# Patient Record
Sex: Female | Born: 1955 | Race: White | Hispanic: No | Smoking: Never smoker
Health system: Southern US, Community
[De-identification: ages and names within clinical notes are randomized; demographics above are authoritative.]

## PROBLEM LIST (undated history)

## (undated) DIAGNOSIS — E669 Obesity, unspecified: Secondary | ICD-10-CM

## (undated) DIAGNOSIS — K219 Gastro-esophageal reflux disease without esophagitis: Secondary | ICD-10-CM

## (undated) DIAGNOSIS — K589 Irritable bowel syndrome without diarrhea: Secondary | ICD-10-CM

## (undated) DIAGNOSIS — I429 Cardiomyopathy, unspecified: Secondary | ICD-10-CM

## (undated) DIAGNOSIS — F419 Anxiety disorder, unspecified: Secondary | ICD-10-CM

## (undated) DIAGNOSIS — R21 Rash and other nonspecific skin eruption: Secondary | ICD-10-CM

## (undated) DIAGNOSIS — R51 Headache: Secondary | ICD-10-CM

## (undated) DIAGNOSIS — N289 Disorder of kidney and ureter, unspecified: Secondary | ICD-10-CM

## (undated) DIAGNOSIS — I10 Essential (primary) hypertension: Secondary | ICD-10-CM

## (undated) DIAGNOSIS — A159 Respiratory tuberculosis unspecified: Secondary | ICD-10-CM

## (undated) DIAGNOSIS — M1990 Unspecified osteoarthritis, unspecified site: Secondary | ICD-10-CM

## (undated) DIAGNOSIS — N393 Stress incontinence (female) (male): Secondary | ICD-10-CM

## (undated) DIAGNOSIS — I509 Heart failure, unspecified: Secondary | ICD-10-CM

## (undated) DIAGNOSIS — D649 Anemia, unspecified: Secondary | ICD-10-CM

## (undated) DIAGNOSIS — B1910 Unspecified viral hepatitis B without hepatic coma: Secondary | ICD-10-CM

## (undated) DIAGNOSIS — N390 Urinary tract infection, site not specified: Secondary | ICD-10-CM

## (undated) DIAGNOSIS — T8859XA Other complications of anesthesia, initial encounter: Secondary | ICD-10-CM

## (undated) DIAGNOSIS — R519 Headache, unspecified: Secondary | ICD-10-CM

## (undated) DIAGNOSIS — Z9889 Other specified postprocedural states: Secondary | ICD-10-CM

## (undated) DIAGNOSIS — R112 Nausea with vomiting, unspecified: Secondary | ICD-10-CM

## (undated) DIAGNOSIS — N809 Endometriosis, unspecified: Secondary | ICD-10-CM

## (undated) DIAGNOSIS — T4145XA Adverse effect of unspecified anesthetic, initial encounter: Secondary | ICD-10-CM

## (undated) DIAGNOSIS — J189 Pneumonia, unspecified organism: Secondary | ICD-10-CM

## (undated) DIAGNOSIS — E785 Hyperlipidemia, unspecified: Secondary | ICD-10-CM

## (undated) HISTORY — DX: Disorder of kidney and ureter, unspecified: N28.9

## (undated) HISTORY — DX: Urinary tract infection, site not specified: N39.0

## (undated) HISTORY — DX: Unspecified viral hepatitis B without hepatic coma: B19.10

## (undated) HISTORY — DX: Cardiomyopathy, unspecified: I42.9

## (undated) HISTORY — DX: Anemia, unspecified: D64.9

## (undated) HISTORY — DX: Heart failure, unspecified: I50.9

## (undated) HISTORY — DX: Irritable bowel syndrome, unspecified: K58.9

## (undated) HISTORY — PX: TONSILLECTOMY: SUR1361

---

## 1983-07-14 DIAGNOSIS — A159 Respiratory tuberculosis unspecified: Secondary | ICD-10-CM

## 1983-07-14 HISTORY — DX: Respiratory tuberculosis unspecified: A15.9

## 1985-07-13 HISTORY — PX: APPENDECTOMY: SHX54

## 1987-07-14 HISTORY — PX: ABDOMINAL ADHESION SURGERY: SHX90

## 1991-07-14 HISTORY — PX: LAPAROSCOPIC LYSIS OF ADHESIONS: SHX5905

## 1992-12-11 HISTORY — PX: OOPHORECTOMY: SHX86

## 1995-04-13 HISTORY — PX: OOPHORECTOMY: SHX86

## 1996-01-11 HISTORY — PX: ABDOMINAL HYSTERECTOMY: SHX81

## 1998-04-08 ENCOUNTER — Ambulatory Visit (HOSPITAL_COMMUNITY): Admission: RE | Admit: 1998-04-08 | Discharge: 1998-04-08 | Payer: Self-pay | Admitting: Obstetrics and Gynecology

## 1999-02-05 ENCOUNTER — Encounter (INDEPENDENT_AMBULATORY_CARE_PROVIDER_SITE_OTHER): Payer: Self-pay

## 1999-02-05 ENCOUNTER — Other Ambulatory Visit: Admission: RE | Admit: 1999-02-05 | Discharge: 1999-02-05 | Payer: Self-pay | Admitting: Obstetrics and Gynecology

## 2000-02-06 ENCOUNTER — Other Ambulatory Visit: Admission: RE | Admit: 2000-02-06 | Discharge: 2000-02-06 | Payer: Self-pay | Admitting: Obstetrics and Gynecology

## 2000-03-22 ENCOUNTER — Encounter: Payer: Self-pay | Admitting: Gastroenterology

## 2000-03-22 ENCOUNTER — Encounter: Admission: RE | Admit: 2000-03-22 | Discharge: 2000-03-22 | Payer: Self-pay | Admitting: Gastroenterology

## 2000-03-24 ENCOUNTER — Encounter: Admission: RE | Admit: 2000-03-24 | Discharge: 2000-03-24 | Payer: Self-pay | Admitting: Gastroenterology

## 2000-03-24 ENCOUNTER — Encounter: Payer: Self-pay | Admitting: Gastroenterology

## 2000-11-08 ENCOUNTER — Encounter: Admission: RE | Admit: 2000-11-08 | Discharge: 2000-11-08 | Payer: Self-pay | Admitting: Family Medicine

## 2000-11-08 ENCOUNTER — Encounter: Payer: Self-pay | Admitting: Family Medicine

## 2000-11-09 ENCOUNTER — Encounter: Admission: RE | Admit: 2000-11-09 | Discharge: 2000-11-09 | Payer: Self-pay | Admitting: Family Medicine

## 2000-11-09 ENCOUNTER — Encounter: Payer: Self-pay | Admitting: Family Medicine

## 2000-11-16 ENCOUNTER — Encounter: Payer: Self-pay | Admitting: Surgery

## 2000-11-16 ENCOUNTER — Encounter: Admission: RE | Admit: 2000-11-16 | Discharge: 2000-11-16 | Payer: Self-pay | Admitting: Surgery

## 2000-11-19 ENCOUNTER — Encounter: Payer: Self-pay | Admitting: Surgery

## 2000-11-19 ENCOUNTER — Encounter: Admission: RE | Admit: 2000-11-19 | Discharge: 2000-11-19 | Payer: Self-pay | Admitting: Surgery

## 2000-11-24 HISTORY — PX: CHOLECYSTECTOMY: SHX55

## 2000-11-29 ENCOUNTER — Encounter: Payer: Self-pay | Admitting: Surgery

## 2000-12-01 ENCOUNTER — Encounter (INDEPENDENT_AMBULATORY_CARE_PROVIDER_SITE_OTHER): Payer: Self-pay | Admitting: Specialist

## 2000-12-01 ENCOUNTER — Encounter: Payer: Self-pay | Admitting: Surgery

## 2000-12-01 ENCOUNTER — Observation Stay (HOSPITAL_COMMUNITY): Admission: RE | Admit: 2000-12-01 | Discharge: 2000-12-02 | Payer: Self-pay | Admitting: Surgery

## 2001-03-08 ENCOUNTER — Other Ambulatory Visit: Admission: RE | Admit: 2001-03-08 | Discharge: 2001-03-08 | Payer: Self-pay | Admitting: Obstetrics and Gynecology

## 2001-12-06 ENCOUNTER — Encounter: Payer: Self-pay | Admitting: Obstetrics and Gynecology

## 2001-12-06 ENCOUNTER — Encounter: Admission: RE | Admit: 2001-12-06 | Discharge: 2001-12-06 | Payer: Self-pay | Admitting: Obstetrics and Gynecology

## 2002-05-26 ENCOUNTER — Other Ambulatory Visit: Admission: RE | Admit: 2002-05-26 | Discharge: 2002-05-26 | Payer: Self-pay | Admitting: Obstetrics and Gynecology

## 2002-10-24 ENCOUNTER — Emergency Department (HOSPITAL_COMMUNITY): Admission: EM | Admit: 2002-10-24 | Discharge: 2002-10-24 | Payer: Self-pay | Admitting: Emergency Medicine

## 2002-10-24 ENCOUNTER — Encounter: Payer: Self-pay | Admitting: Emergency Medicine

## 2002-12-21 ENCOUNTER — Encounter: Admission: RE | Admit: 2002-12-21 | Discharge: 2002-12-21 | Payer: Self-pay | Admitting: Family Medicine

## 2002-12-21 ENCOUNTER — Encounter: Payer: Self-pay | Admitting: Family Medicine

## 2006-12-08 ENCOUNTER — Ambulatory Visit (HOSPITAL_COMMUNITY): Admission: RE | Admit: 2006-12-08 | Discharge: 2006-12-08 | Payer: Self-pay | Admitting: Urology

## 2006-12-20 ENCOUNTER — Ambulatory Visit (HOSPITAL_COMMUNITY): Admission: RE | Admit: 2006-12-20 | Discharge: 2006-12-20 | Payer: Self-pay | Admitting: Surgery

## 2006-12-29 ENCOUNTER — Encounter (HOSPITAL_BASED_OUTPATIENT_CLINIC_OR_DEPARTMENT_OTHER): Payer: Self-pay | Admitting: General Surgery

## 2006-12-29 ENCOUNTER — Inpatient Hospital Stay (HOSPITAL_COMMUNITY): Admission: RE | Admit: 2006-12-29 | Discharge: 2007-01-04 | Payer: Self-pay | Admitting: General Surgery

## 2007-01-11 ENCOUNTER — Inpatient Hospital Stay (HOSPITAL_COMMUNITY): Admission: EM | Admit: 2007-01-11 | Discharge: 2007-01-13 | Payer: Self-pay | Admitting: Emergency Medicine

## 2007-01-18 ENCOUNTER — Ambulatory Visit (HOSPITAL_COMMUNITY): Admission: RE | Admit: 2007-01-18 | Discharge: 2007-01-18 | Payer: Self-pay | Admitting: General Surgery

## 2007-05-23 ENCOUNTER — Ambulatory Visit (HOSPITAL_COMMUNITY): Admission: RE | Admit: 2007-05-23 | Discharge: 2007-05-23 | Payer: Self-pay | Admitting: General Surgery

## 2007-06-24 ENCOUNTER — Emergency Department (HOSPITAL_COMMUNITY): Admission: EM | Admit: 2007-06-24 | Discharge: 2007-06-24 | Payer: Self-pay | Admitting: Emergency Medicine

## 2007-08-10 ENCOUNTER — Ambulatory Visit (HOSPITAL_COMMUNITY): Admission: RE | Admit: 2007-08-10 | Discharge: 2007-08-10 | Payer: Self-pay | Admitting: Family Medicine

## 2008-07-13 HISTORY — PX: COLON SURGERY: SHX602

## 2010-08-03 ENCOUNTER — Encounter: Payer: Self-pay | Admitting: Surgery

## 2010-11-25 NOTE — Op Note (Signed)
NAMEOTIS, BURRESS               ACCOUNT NO.:  [id number]   MEDICAL RECORD NO.:  [id number]          PATIENT TYPE:  INP   LOCATION:  3310                         FACILITY:  [facility]   PHYSICIAN:  Leonie Man, M.D.   DATE OF BIRTH:  1956/03/10   DATE OF PROCEDURE:  12/29/2006  DATE OF DISCHARGE:                               OPERATIVE REPORT   PREOPERATIVE DIAGNOSIS:  Rectovaginal fistula with diverticulitis.   POSTOPERATIVE DIAGNOSIS:  Rectovaginal fistula with diverticulitis.   PROCEDURE:  Takedown of rectovaginal fistula with rectosigmoid colectomy  and low colorectal anastomosis.   SURGEON:  Leonie Man, M.D.   ASSISTANT:  Rose Phi. Maple Hudson, M.D.   ANESTHESIA:  General.   INDICATIONS:  The patient is a 55 year old female who presents with a  history of multiple abdominal operations having previously undergone a  hysterectomy.  She has been having recurrent episodes of diverticulitis  over the past several years. Several weeks ago, she began passing stool  through the vagina.  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 CT scan of the  abdomen and pelvis also confirmed the presence of a fistula between the  vaginal stump and the rectum.  The patient comes to the operating room  now after the risks and potential benefits of surgery have been fully  discussed, all questions answered, and consent obtained for same.   DESCRIPTION OF PROCEDURE:  Following the induction of satisfactory  general anesthesia, with the patient positioned in the lithotomy  position, positive identification of the patient was made.  The abdomen  and perineum were prepped and draped to be included in a sterile  operative field. Exploratory laparotomy is carried out through a midline  incision extending from just above the pubic tubercle up to the  umbilicus, deepened through skin and subcutaneous tissues and through  the linea alba.   The midline was opened through the old scar cicatrix.  Dissection was carried down into the pelvis where multiple loops of  small intestine were attached within the pelvis.  These were lysed. The  patient was placed in Trendelenburg position and the small intestine  packed into the upper abdomen.   Dissection was carried down along the retroperitoneal reflection of the  sigmoid colon, carrying this down to the area of attachment to the  vaginal stump. This was dissected free and brought up into the wound,  dissection along the lateral walls of the rectum were also carried out  so as to mobilize the rectum up further into the abdomen.  I then  dissected and transected approximately the mid to lower third of the  rectum leaving adequate margins around the area of inflammation.  This  was done with a contour stapler.  The proximal colon was also divided  with a GIA-75 stapler and the intervening mesentery was taken with a  LigaSure, thereby removing the area of inflammation in the rectosigmoid.  Hemostasis was ensured within the pelvis and an EEA stapled anastomosis  was carried out between the mid sigmoid and the distal rectum.  The  resulting anastomosis was noted to be widely patent.  Proctosigmoidoscopy was carried out and air pumped into the rectum with  fluid in the pelvis and there was no evidence of leak.   All areas of dissection were then checked for hemostasis and noted to be  dry. Sponge, instrument and sharp counts were doubly verified.  The  peritoneal cavity was irrigated with multiple aliquots of normal saline  and aspirated out.  The midline incision was then closed with a running  #1 Novofil suture.  The subcutaneous tissues were thoroughly irrigated.  The skin was closed with staples.  A sterile dressing was applied to the  wound, the anesthetic reversed, and the patient removed from the  operating room to the recovery room in stable condition.  She tolerated  the  procedure well.      Leonie Man, M.D.  Electronically Signed     PB/MEDQ  D:  12/29/2006  T:  12/29/2006  Job:  [id number]   cc:   Currie Paris, M.D.  Donia Guiles, M.D.

## 2010-11-28 NOTE — Discharge Summary (Signed)
Shelly [name], Shelly [name]               ACCOUNT NO.:  [id number]   MEDICAL RECORD NO.:  [id number]          PATIENT TYPE:  OUT   LOCATION:  XRAY                         FACILITY:  [facility]   PHYSICIAN:  Leonie Man, M.D.   DATE OF BIRTH:  1956/02/15   DATE OF ADMISSION:  12/29/2006  DATE OF DISCHARGE:  01/04/2007                               DISCHARGE SUMMARY   ADMISSION DIAGNOSIS:  Diverticulitis with rectovaginal fistula.   DISCHARGE DIAGNOSIS:  Same.   PROCEDURE:  Rectosigmoid colectomy.   COMPLICATIONS:  None.   CONDITION ON DISCHARGE:  Improved.   SECONDARY DIAGNOSES:  A newly discovered diabetes mellitus.   DISCHARGE MEDICATIONS:  Mepergan 1 to 2 every 24 hours p.r.n. pain.   FOLLOW UP:  Follow up will be with diabetic teaching service and with  Dr. Lurene Shadow in 1 week.   HOSPITAL COURSE:  The patient is a 55 year old female with a feculent  discharge from the vagina during which the course of workup showed a  fistula between the sigmoid colon and the vagina.  The patient was  admitted to the hospital on the day of surgery.  Following routine bowel  prep, she was taken to the operating room on 12/29/2006 where she  underwent a rectosigmoid resection and low colorectal anastomosis.  Her  postoperative course was benign with normal resumption of diet and  activity.  During the course of her of her hospitalization, a hemoglobin  A1c was checked and noted to be elevated.  Thereby, including a  diagnosis of type 2 diabetes mellitus.  The patient was seen and  counseled by the diabetes teaching service and was scheduled for follow  up with them on discharge.  At the time of discharge, the incision was  healing well and the patient was tolerating a regular diet without any  difficulty.  She is being discharged to be followed up in the office in  2 weeks.      Leonie Man, M.D.  Electronically Signed     PB/MEDQ  D:  02/10/2007  T:  02/11/2007  Job:  [id number]

## 2010-11-28 NOTE — Op Note (Signed)
[facility]  Patient:    Shelly [name], Shelly [name]                      MRN: [id number] Proc. Date: 12/01/00 Adm. Date:  [id number] Attending:  Charlton Haws CC:         Desma Maxim, M.D.  Florencia Reasons, M.D.  Guy Sandifer Ar[place], M.D.   Operative Report  CCS#:  [id number]  PREOPERATIVE DIAGNOSIS:  Biliary dyskinesia.  POSTOPERATIVE DIAGNOSIS:  Biliary dyskinesia.  OPERATION PERFORMED:  Laparoscopic cholecystectomy with operative cholangiogram.  SURGEON:  Dr. Jamey Ripa.  ASSISTANT:  Dr. Earlene Plater.  ANESTHESIA:  General endotracheal.  CLINICAL HISTORY:  This patient is a 55 year old lady with various abdominal complaints but particularly has been having some upper abdominal, right midline, and right upper quadrant pain associated with some nausea and change in her stools. GI workup was negative except for the fact that she had had abnormal biliary emptying scan suggesting biliary dyskinesia. She has had multiple prior operations.  DESCRIPTION OF PROCEDURE:  The patient was brought to the operating room and after satisfactory general anesthesia had been obtained, the abdomen was prepped and draped. Because of prior umbilical and lower midline incisions, I made an incision just above the umbilicus and separated the subcutaneous and fatty tissues down to the fascia which I was able to identify and grasp with a Kocher and opened with a knife. She had approximately 2 inches of adipose tissue to go through. Once I was able to enter the peritoneal cavity under direct vision, I put my finger in and could feel what felt like some omental adhesions but was able to find the preperitoneal cavity and put a Hasson cannula in and hold it in place with some holding sutures of Vicryl.  The abdomen was insufflated and the 10 mm trocar placed in the epigastrium. Using the camera there, I looked back down in the rest of the abdomen and the only specific  abnormalities were some omental adhesions to the lower midline but there was no bowel involved in any of these adhesions and no evidence of hernias or other problems. I couldnt really visualize the pelvic organs well.  The patient was placed in reverse Trendelenburg and two 5 mm trocars placed in the usual positions. The gallbladder was retracted over the liver and tractioned outward by the [facility] and the peritoneum opened. I could identify the cystic duct at its junction with the gallbladder and see as it went down towards the common duct but didnt dissect that out completely. The cystic artery was identified as it was coming up on the gallbladder and could be seen coming off of what looked like the right hepatic artery. I clipped the cystic duct once at its junction with the gallbladder and put one clip on th cystic artery initially. The cystic duct was opened and some bile milked out.  An angiocath was used to thread a Reddick catheter into the abdominal cavity and this was threaded into the cystic duct and held with a clip. Operative cholangiogram showed good filling of the hepatic radicles and common duct with good emptying into the duodenum and had a little bit of filling of the pancreatic duct. This looking normal, the cystic duct catheter was then removed and three clips placed on the stay side of the cystic duct and it was divided.  The cystic artery had two more duct clips put on it and it was divided leaving  two clips on the stay side. The gallbladder was removed from below to above with coagulation current of the cautery. Once it was disconnected, we looked to make sure everything appeared to be dry and there was no blood or bile leaks. The gallbladder was removed out the umbilical port and that site was fitted with my finger while reinsufflated and made a final check for hemostasis. Everything appeared to be okay and the lateral ports were removed. Those sites did  not bleed.  The supraumbilical incision was closed with two sutures of #0 Prolene. A final check was made with the camera in the epigastric port and the abdomen deflated. The skin was closed with 4-0 monocryl subcuticular plus Steri-Strips. The patient tolerated the procedure well. There were no operative complications. All counts were correct. DD:  12/01/00 TD:  12/01/00 Job: [id number]

## 2011-04-28 LAB — CULTURE, ROUTINE-ABSCESS: Culture: NO GROWTH

## 2011-04-28 LAB — BASIC METABOLIC PANEL
CO2: 22
Calcium: 9
Creatinine, Ser: 0.92
Glucose, Bld: 194 — ABNORMAL HIGH
Sodium: 133 — ABNORMAL LOW

## 2011-04-28 LAB — URINALYSIS, ROUTINE W REFLEX MICROSCOPIC
Bilirubin Urine: NEGATIVE
Ketones, ur: NEGATIVE
Nitrite: NEGATIVE
Protein, ur: NEGATIVE
Specific Gravity, Urine: 1.015
Urobilinogen, UA: 0.2

## 2011-04-28 LAB — DIFFERENTIAL
Basophils Absolute: 0.3 — ABNORMAL HIGH
Basophils Relative: 1
Monocytes Absolute: 1.8 — ABNORMAL HIGH
Neutro Abs: 16.2 — ABNORMAL HIGH

## 2011-04-28 LAB — CBC
Hemoglobin: 10.7 — ABNORMAL LOW
MCHC: 35.2
RDW: 12.8

## 2011-04-28 LAB — ANAEROBIC CULTURE

## 2011-04-28 LAB — URINE MICROSCOPIC-ADD ON

## 2011-04-29 LAB — CBC
HCT: 26.1 — ABNORMAL LOW
HCT: 37.4
Hemoglobin: 10.7 — ABNORMAL LOW
Hemoglobin: 9 — ABNORMAL LOW
MCHC: 34.5
MCV: 88.1
MCV: 88.7
MCV: 89.2
Platelets: 231
Platelets: 271
Platelets: 338
RBC: 3.5 — ABNORMAL LOW
RDW: 13
RDW: 13
WBC: 12 — ABNORMAL HIGH
WBC: 23 — ABNORMAL HIGH

## 2011-04-29 LAB — DIFFERENTIAL
Basophils Absolute: 0.1
Lymphocytes Relative: 33
Monocytes Absolute: 0.6
Monocytes Relative: 6
Neutro Abs: 5.8
Neutrophils Relative %: 55

## 2011-04-29 LAB — URINALYSIS, ROUTINE W REFLEX MICROSCOPIC
Bilirubin Urine: NEGATIVE
Ketones, ur: NEGATIVE
Nitrite: NEGATIVE
Urobilinogen, UA: 0.2
pH: 5.5

## 2011-04-29 LAB — TYPE AND SCREEN: ABO/RH(D): O NEG

## 2011-04-29 LAB — COMPREHENSIVE METABOLIC PANEL
ALT: 84 — ABNORMAL HIGH
AST: 46 — ABNORMAL HIGH
Albumin: 2.4 — ABNORMAL LOW
Albumin: 3.4 — ABNORMAL LOW
Alkaline Phosphatase: 67
BUN: 11
CO2: 24
Chloride: 103
Chloride: 106
Creatinine, Ser: 0.92
Creatinine, Ser: 1.01
GFR calc Af Amer: >60
GFR calc non Af Amer: 56 — ABNORMAL LOW
Glucose, Bld: 193 — ABNORMAL HIGH
Glucose, Bld: 210 — ABNORMAL HIGH
Potassium: 4.8
Sodium: 135
Total Bilirubin: 0.8
Total Bilirubin: 0.9
Total Protein: 7.6

## 2011-04-29 LAB — BASIC METABOLIC PANEL
BUN: 10
Creatinine, Ser: 0.84
GFR calc non Af Amer: >60
Potassium: 4.1

## 2011-04-29 LAB — PROTIME-INR: INR: 1.1

## 2011-04-29 LAB — APTT: aPTT: 37

## 2011-04-29 LAB — HEMOGLOBIN A1C: Hgb A1c MFr Bld: 8.6 — ABNORMAL HIGH

## 2011-04-29 LAB — URINE MICROSCOPIC-ADD ON: Bacteria, UA: NONE SEEN

## 2011-04-29 LAB — ABO/RH: ABO/RH(D): O NEG

## 2011-09-09 ENCOUNTER — Emergency Department (HOSPITAL_COMMUNITY): Payer: BC Managed Care – PPO

## 2011-09-09 ENCOUNTER — Observation Stay (HOSPITAL_COMMUNITY)
Admission: EM | Admit: 2011-09-09 | Discharge: 2011-09-09 | Disposition: A | Discharge status: Home / Self Care | Payer: BC Managed Care – PPO | Attending: Emergency Medicine | Admitting: Emergency Medicine

## 2011-09-09 ENCOUNTER — Encounter (HOSPITAL_COMMUNITY): Payer: Self-pay | Admitting: *Deleted

## 2011-09-09 DIAGNOSIS — E119 Type 2 diabetes mellitus without complications: Secondary | ICD-10-CM | POA: Insufficient documentation

## 2011-09-09 DIAGNOSIS — R509 Fever, unspecified: Secondary | ICD-10-CM | POA: Insufficient documentation

## 2011-09-09 DIAGNOSIS — I10 Essential (primary) hypertension: Secondary | ICD-10-CM | POA: Insufficient documentation

## 2011-09-09 DIAGNOSIS — F411 Generalized anxiety disorder: Secondary | ICD-10-CM | POA: Insufficient documentation

## 2011-09-09 DIAGNOSIS — R42 Dizziness and giddiness: Secondary | ICD-10-CM | POA: Insufficient documentation

## 2011-09-09 DIAGNOSIS — B349 Viral infection, unspecified: Secondary | ICD-10-CM

## 2011-09-09 DIAGNOSIS — R51 Headache: Principal | ICD-10-CM | POA: Insufficient documentation

## 2011-09-09 DIAGNOSIS — R000 Tachycardia, unspecified: Secondary | ICD-10-CM | POA: Insufficient documentation

## 2011-09-09 DIAGNOSIS — R1110 Vomiting, unspecified: Secondary | ICD-10-CM | POA: Insufficient documentation

## 2011-09-09 DIAGNOSIS — E860 Dehydration: Secondary | ICD-10-CM | POA: Insufficient documentation

## 2011-09-09 HISTORY — DX: Hyperlipidemia, unspecified: E78.5

## 2011-09-09 HISTORY — DX: Anxiety disorder, unspecified: F41.9

## 2011-09-09 HISTORY — DX: Obesity, unspecified: E66.9

## 2011-09-09 HISTORY — DX: Essential (primary) hypertension: I10

## 2011-09-09 HISTORY — DX: Gastro-esophageal reflux disease without esophagitis: K21.9

## 2011-09-09 LAB — CSF CELL COUNT WITH DIFFERENTIAL
Eosinophils, CSF: NONE SEEN % (ref 0–1)
RBC Count, CSF: 1 /mm3 — ABNORMAL HIGH
Tube #: 1
Tube #: 4

## 2011-09-09 LAB — CBC
MCH: 29.5 pg (ref 26.0–34.0)
MCHC: 34.7 g/dL (ref 30.0–36.0)
Platelets: 383 10*3/uL (ref 150–400)
RBC: 3.9 MIL/uL (ref 3.87–5.11)
RDW: 13.3 % (ref 11.5–15.5)

## 2011-09-09 LAB — GRAM STAIN

## 2011-09-09 LAB — BASIC METABOLIC PANEL
Calcium: 9.9 mg/dL (ref 8.4–10.5)
GFR calc Af Amer: 38 mL/min — ABNORMAL LOW (ref >90)
GFR calc non Af Amer: 33 mL/min — ABNORMAL LOW (ref >90)
Sodium: 134 mEq/L — ABNORMAL LOW (ref 135–145)

## 2011-09-09 MED ORDER — SODIUM CHLORIDE 0.9 % IV BOLUS (SEPSIS): 1000.0000 mL | Freq: Once | INTRAVENOUS | Status: DC

## 2011-09-09 MED ORDER — METOCLOPRAMIDE HCL 5 MG/ML IJ SOLN: 10.0000 mg | Freq: Four times a day (QID) | INTRAMUSCULAR | Status: DC | PRN

## 2011-09-09 MED ORDER — PROMETHAZINE HCL 25 MG PO TABS: 25.0000 mg | ORAL_TABLET | Freq: Three times a day (TID) | ORAL | Status: DC | PRN

## 2011-09-09 MED ORDER — METOCLOPRAMIDE HCL 5 MG/ML IJ SOLN
10.0000 mg | Freq: Once | INTRAMUSCULAR | Status: AC
  Administered 2011-09-09: 10 mg via INTRAVENOUS
  Filled 2011-09-09: qty 2

## 2011-09-09 MED ORDER — SODIUM CHLORIDE 0.9 % IV BOLUS (SEPSIS)
1000.0000 mL | Freq: Once | INTRAVENOUS | Status: AC
  Administered 2011-09-09: 1000 mL via INTRAVENOUS

## 2011-09-09 MED ORDER — ACETAMINOPHEN 325 MG PO TABS: 650.0000 mg | ORAL_TABLET | ORAL | Status: DC | PRN

## 2011-09-09 NOTE — ED Notes (Signed)
Patient stated wants to go home and would like a prescription for phenergan.  PA notified.

## 2011-09-09 NOTE — ED Notes (Signed)
Pt back from IR 

## 2011-09-09 NOTE — Discharge Instructions (Signed)
Please follow up with Dr Clelia Croft tomorrow morning to schedule a close follow up appointment for tomorrow or the following day for a recheck of your kidney function.  Please drink plenty of fluids and rest over the next few days.  You may return to the ER at any time for worsening condition or any new symptoms that concern you.    Dehydration Dehydration is the reduction of water and fluid from the body to a level below that required for proper functioning. CAUSES  Dehydration occurs when there is excessive fluid loss from the body or when loss of normal fluids is not adequately replaced.  Loss of fluids occurs in vomiting, diarrhea, excessive sweating, excessive urine output, or excessive loss of fluid from the lungs (as occurs in fever or in patients on a ventilator).   Inadequate fluid replacement occurs with nausea or decreased appetite due to illness, sore throat, or mouth pain.  SYMPTOMS  Mild dehydration  Thirst (infants and young children may not be able to tell you they are thirsty).   Dry lips.   Slightly dry mouth membranes.  Moderate dehydration  Very dry mouth membranes.   Sunken eyes.   Sunken soft spot (fontanelle) on infant's head.   Skin does not bounce back quickly when lightly pinched and released.   Decreased urine production.   Decreased tear production.  Severe dehydration  Rapid, weak pulse (more than 100 beats per minute at rest).   Cold hands and feet.   Loss of ability to sweat in spite of heat and temperature.   Rapid breathing.   Blue lips.   Confusion, lethargy, difficult to arouse.   Minimal urine production.   No tears.  DIAGNOSIS  Your caregiver will diagnose dehydration based on your symptoms and your exam. Blood and urine tests will help confirm the diagnosis. The diagnostic evaluation should also identify the cause of dehydration. PREVENTION  The body depends on a proper balance of fluid and salts (electrolytes) for normal function.  Adequate fluid intake in the presence of illness or other stresses (such as extreme exercise) is important.  TREATMENT   Mild dehydration is safe to self-treat for most ages as long as it does not worsen. Contact your caregiver for even mild dehydration in infants and the elderly.   In teenagers and adults with moderate dehydration, careful home treatment (as outlined below) can be safe. Phone contact with a caregiver is advised. Children under 3 years of age with moderate dehydration should see a caregiver.   If you or your child is severely dehydrated, go to a hospital for treatment. Intravenous (IV) fluids will quickly reverse dehydration and are often lifesaving in young children, infants, and elderly persons.  HOME CARE INSTRUCTIONS  Small amounts of fluids should be taken frequently. Large amounts at one time may not be tolerated. Plain water may be harmful in infants and the elderly. Oral rehydration solutions (ORS) are available at pharmacies and grocery stores. ORS replaces water and important electrolytes in proper proportions. Sports drinks are not as effective as ORS and may be harmful because the sugar can make diarrhea worse.  As a general guideline for children, replace any new fluid losses from diarrhea and/or vomiting with ORS as follows:   If your child weighs 22 pounds or under (10 kg or less), give 60-120 mL (1/4-1/2 cup or 2-4 ounces) of ORS for each diarrheal stool or vomiting episode.   If your child weighs more than 22 pounds (more than 10 kg),  give 120-240 mL (1/2-1 cup or 4-8 ounces) of ORS for each diarrheal stool or vomiting episode.   If your child is vomiting, it may be helpful to give the above ORS replacement in 5 mL (1 teaspoon) amounts every 5 minutes and increase as tolerated.   While correcting for dehydration, children should eat normally. However, foods high in sugar should be avoided because they may worsen diarrhea. Large amounts of carbonated soft drinks,  juice, gelatin desserts, and other highly sugared drinks should be avoided.   After correction of dehydration, other liquids that are appealing to the child may be added. Children should drink small amounts of fluids frequently and fluids should be increased as tolerated. Children should drink enough fluids to keep urine clear or pale yellow.   Adults should eat normally while drinking more fluids than usual. Drink small amounts of fluids frequently and increase the amount as tolerated. Drink enough fluids to keep urine clear or pale yellow. Broths, weak decaffeinated tea, lemon-lime soft drinks (allowed to go flat), and ORS replace fluids and electrolytes.   Avoid:   Carbonated drinks.   Juice.   Extremely hot or cold fluids.   Caffeine drinks.   Fatty, greasy foods.   Alcohol.   Tobacco.   Too much intake of anything at one time.   Gelatin desserts.   Probiotics are active cultures of beneficial bacteria. They may lessen the amount and number of diarrheal stools in adults. Probiotics can be found in yogurt with active cultures and in supplements.   Wash your hands well to avoid spreading germs (bacteria) and viruses.   Antidiarrheal medicines are not recommended for infants and children.   Only take over-the-counter or prescription medicines for pain, discomfort, or fever as directed by your caregiver. Do not give aspirin to children.   For adults with dehydration, ask your caregiver if you should continue all prescribed and over-the-counter medicines.   If your caregiver has given you a follow-up appointment, it is very important to keep that appointment. Not keeping the appointment could result in a lasting (chronic) or permanent injury and disability. If there is any problem keeping the appointment, you must call to reschedule.  SEEK IMMEDIATE MEDICAL CARE IF:   You are unable to keep fluids down or other symptoms become worse despite treatment.   Vomiting or diarrhea  develops and becomes persistent.   There is vomiting of blood or green matter (bile).   There is blood in the stool or the stools are black and tarry.   There is no urine output in 6 to 8 hours or there is only a small amount of very dark urine.   Abdominal pain develops, increases, or localizes.   You or your child has an oral temperature above 102 F (38.9 C), not controlled by medicine.   Your baby is older than 3 months with a rectal temperature of 102.25F (38.9 C) or higher.   Your baby is 73 months old or younger with a rectal temperature of 100.4 F (38 C) or higher.   You develop excessive weakness, dizziness, fainting, or extreme thirst.   You develop a rash, stiff neck, severe headache, or you become irritable, sleepy, or difficult to awaken.  MAKE SURE YOU:  Understand these instructions. Headache Headaches are caused by many different problems. Most commonly, headache is caused by muscle tension from an injury, fatigue, or emotional upset. Excessive muscle contractions in the scalp and neck result in a headache that often feels like  a tight band around the head. Tension headaches often have areas of tenderness over the scalp and the back of the neck. These headaches may last for hours, days, or longer, and some may contribute to migraines in those who have migraine problems. Migraines usually cause a throbbing headache, which is made worse by activity. Sometimes only one side of the head hurts. Nausea, vomiting, eye pain, and avoidance of food are common with migraines. Visual symptoms such as light sensitivity, blind spots, or flashing lights may also occur. Loud noises may worsen migraine headaches. Many factors may cause migraine headaches:  Emotional stress, lack of sleep, and menstrual periods.   Alcohol and some drugs (such as birth control pills).   Diet factors (fasting, caffeine, food preservatives, chocolate).   Environmental factors (weather changes, bright  lights, odors, smoke).  Other causes of headaches include minor injuries to the head. Arthritis in the neck; problems with the jaw, eyes, ears, or nose are also causes of headaches. Allergies, drugs, alcohol, and exposure to smoke can also cause moderate headaches. Rebound headaches can occur if someone uses pain medications for a long period of time and then stops. Less commonly, blood vessel problems in the neck and brain (including stroke) can cause various types of headache. Treatment of headaches includes medicines for pain and relaxation. Ice packs or heat applied to the back of the head and neck help some people. Massaging the shoulders, neck and scalp are often very useful. Relaxation techniques and stretching can help prevent these headaches. Avoid alcohol and cigarette smoking as these tend to make headaches worse. Please see your caregiver if your headache is not better in 2 days.  SEEK IMMEDIATE MEDICAL CARE IF:   You develop a high fever, chills, or repeated vomiting.   You faint or have difficulty with vision.   You develop unusual numbness or weakness of your arms or legs.   Relief of pain is inadequate with medication, or you develop severe pain.   You develop confusion, or neck stiffness.   You have a worsening of a headache or do not obtain relief.  Document Released: 06/29/2005 Document Revised: 03/11/2011 Document Reviewed: 12/23/2006 [facility] Patient Information 2012 [place], [place].Viral Infections A viral infection can be caused by different types of viruses.Most viral infections are not serious and resolve on their own. However, some infections may cause severe symptoms and may lead to further complications. SYMPTOMS Viruses can frequently cause: Minor sore throat.  Aches and pains.  Headaches.  Runny nose.  Different types of rashes.  Watery eyes.  Tiredness.  Cough.  Loss of appetite.  Gastrointestinal infections, resulting in nausea, vomiting, and diarrhea.    These symptoms do not respond to antibiotics because the infection is not caused by bacteria. However, you might catch a bacterial infection following the viral infection. This is sometimes called a "superinfection." Symptoms of such a bacterial infection may include: Worsening sore throat with pus and difficulty swallowing.  Swollen neck glands.  Chills and a high or persistent fever.  Severe headache.  Tenderness over the sinuses.  Persistent overall ill feeling (malaise), muscle aches, and tiredness (fatigue).  Persistent cough.  Yellow, green, or brown mucus production with coughing.  HOME CARE INSTRUCTIONS  Only take over-the-counter or prescription medicines for pain, discomfort, diarrhea, or fever as directed by your caregiver.  Drink enough water and fluids to keep your urine clear or pale yellow. Sports drinks can provide valuable electrolytes, sugars, and hydration.  Get plenty of rest and maintain  proper nutrition. Soups and broths with crackers or rice are fine.  SEEK IMMEDIATE MEDICAL CARE IF:  You have severe headaches, shortness of breath, chest pain, neck pain, or an unusual rash.  You have uncontrolled vomiting, diarrhea, or you are unable to keep down fluids.  You or your child has an oral temperature above 102 F (38.9 C), not controlled by medicine.  Your baby is older than 3 months with a rectal temperature of 102 F (38.9 C) or higher.  Your baby is 95 months old or younger with a rectal temperature of 100.4 F (38 C) or higher.  MAKE SURE YOU:  Understand these instructions.  Will watch your condition.  Will get help right away if you are not doing well or get worse.  Document Released: 04/08/2005 Document Revised: 03/11/2011 Document Reviewed: 11/03/2010  [facility] Patient Information 2012 [place], [place].  Will watch your condition.   Will get help right away if you are not doing well or get worse.  Document Released: 06/29/2005 Document Revised: 01/12/2011  Document Reviewed: 05/28/2009 [facility] Patient Information 2012 [place], [place].

## 2011-09-09 NOTE — ED Notes (Signed)
Patient transported to CT 

## 2011-09-09 NOTE — ED Notes (Signed)
Went to patient room to do vitals signs.  Patient was out of room, taken to Xray

## 2011-09-09 NOTE — ED Provider Notes (Signed)
Pt relates headache for the past 11 days and then started getting fever to 104, nausea, vomiting,photophobia and neck pain the past 5-6 days. She denies cough or ST.  She was seen at an [facility] 4 days ago and told to come to the ED if she got worse. Pt states 4 men from work have been staying in a hotel too sick to come to work also.   Pt looks like she feels bad, she has a healing cold sore that she states came with the high fever. She is wearing sunglasses, seems uncomfortable with ROM of her neck  CSF pending  Medical screening examination/treatment/procedure(s) were conducted as a shared visit with non-physician practitioner(s) and myself.  I personally evaluated the patient during the encounter Devoria Albe, MD, Franz Dell, MD 09/09/11 [phone]

## 2011-09-09 NOTE — ED Provider Notes (Signed)
Receive from [facility].  Patient intended to come to CDU for dehydration protocol.  Prior to coming to CDU patient relayed the message that she wanted to go home.  I spoke with the patient and she states that her headache is very really relieved and she does not want to stay in the CDU for dehydration, protocol, and prefers to go home.  Patient declined any further treatment or IV fluids, declined to stay in the CDU under protocol, declined admission to the hospital.  Patient states that she will be able to followup with her primary care provider tomorrow morning for recheck and will drink plenty of fluids at home.  I discussed this with Dr. Doylene Canard, who agrees with plan.  Patient is tolerating fluids by mouth. Patient to be discharged home with close PCP follow up for recheck of renal function.  Patient verbalizes understanding and agrees with plan.    Dillard Cannon [place], [place] 09/09/11 2221

## 2011-09-09 NOTE — ED Provider Notes (Signed)
History     CSN: [id number]  Arrival date & time 09/09/11  1137   First MD Initiated Contact with Patient 09/09/11 1217      Chief Complaint  Patient presents with  . Headache  . Emesis  . Dizziness  . Fever    (Consider location/radiation/quality/duration/timing/severity/associated sxs/prior treatment) The history is provided by the patient.   the patient is a 56 year old female with a history of diabetes, hypertension, anxiety who presents to the emergency department per the instructions of her primary physician with a chief complaint of generalized headache x11 days. Headache was gradual in onset and is now constant and severe. Actually 5 days ago, she developed a high fever with a temperature maximum of 1062F. For the last 2 days, her MAXIMUM TEMPERATURE has been 100.62F. She has been afebrile today. She has had nausea and vomiting associated with the headache for its entirety. She has had neck pain and stiffness associated with headache for the last 6 days. She has had blisters to her lips, which she has never transient 4, for approximately the last 1-1/2 weeks. In the last couple of days, she reports she's had difficulty finding her words at times.  She denies any visual change, change in hearing, chest pain, shortness of breath, cough, abdominal pain, numbness or weakness to the arms or legs, or rash.She was treated by her primary physician with Toradol and Phenergan prior to my assessment with mild relief of the headache and nausea.  Past Medical History  Diagnosis Date  . Diabetes mellitus   . Hypertension   . Hyperlipemia   . Asthma   . Acid reflux   . Anxiety   . Obesity     Past Surgical History  Procedure Date  . Abdominal hysterectomy   . Cholecystectomy     History reviewed. No pertinent family history.  History  Substance Use Topics  . Smoking status: Former Games developer  . Smokeless tobacco: Not on file  . Alcohol Use: Yes     occ     Review of Systems 10  systems reviewed and are negative for acute change except as noted in the HPI.  Allergies  Lisinopril and Sulfa antibiotics  Home Medications   Current Outpatient Rx  Name Route Sig Dispense Refill  . ALBUTEROL SULFATE HFA 108 (90 BASE) MCG/ACT IN AERS Inhalation Inhale 2 puffs into the lungs every 4 (four) hours as needed. Shortness of breath    . ALPRAZOLAM 0.5 MG PO TABS Oral Take 0.5 mg by mouth 3 (three) times daily as needed. Anxiety    . ASPIRIN EC 81 MG PO TBEC Oral Take 81 mg by mouth daily.    . ATORVASTATIN CALCIUM 10 MG PO TABS Oral Take 10 mg by mouth daily.    . COLESEVELAM HCL 625 MG PO TABS Oral Take 312.25 mg by mouth daily.    Marland Kitchen FLUCONAZOLE 150 MG PO TABS Oral Take 150 mg by mouth daily.    Marland Kitchen FLUTICASONE-SALMETEROL 250-50 MCG/DOSE IN AEPB Inhalation Inhale 1 puff into the lungs every 12 (twelve) hours.    . INSULIN GLARGINE 100 UNIT/ML [place] SOLN Subcutaneous Inject 80 Units into the skin at bedtime.    Marland Kitchen LOSARTAN POTASSIUM-HCTZ 100-25 MG PO TABS Oral Take 1 tablet by mouth daily.    Marland Kitchen MONTELUKAST SODIUM 10 MG PO TABS Oral Take 10 mg by mouth at bedtime.    . OMEPRAZOLE 20 MG PO CPDR Oral Take 20 mg by mouth daily.  BP 129/85  Pulse 105  Temp(Src) 98.7 F (37.1 C) (Oral)  Resp 16  SpO2 97%  Physical Exam  Nursing note reviewed. Constitutional: She is oriented to person, place, and time. She appears well-developed and well-nourished.       Vital signs reviewed and are significant for tachycardia. Afebrile. Uncomfortable appearing, lying on stretcher with sunglasses on.  HENT:  Head: Normocephalic and atraumatic.  Right Ear: External ear normal.  Left Ear: External ear normal.  Mouth/Throat: Oropharynx is clear and moist. No oropharyngeal exudate.       Mucous membranes dry. Multiple fever blisters to lips, all scabbed over.  Eyes: Conjunctivae and EOM are normal. Pupils are equal, round, and reactive to light.       No nystagmus  Neck: Neck supple. No  Brudzinski's sign and no Kernig's sign noted.       Neck with mild tenderness posteriorly. Pt makes some effort at neck ROM and is able to flex approx halfway to chest with some pain.  Cardiovascular: Regular rhythm, normal heart sounds and intact distal pulses.        Rate 100 on auscultation  Pulmonary/Chest: Effort normal and breath sounds normal. No respiratory distress. She has no wheezes. She exhibits no tenderness.  Abdominal: Soft. Bowel sounds are normal. She exhibits no distension. There is Tenderness: diffuse mild tendnerness.. There is no rebound and no guarding.  Musculoskeletal: She exhibits no edema. Tenderness: Moderate tenderness to bilateral paralumbar region.  Neurological: She is alert and oriented to person, place, and time. No cranial nerve deficit. Abnormal coordination: F-N intact bilaterally.  Skin: Skin is dry. No rash noted.  Psychiatric:       Anxious appearing    ED Course  Procedures (including critical care time)  Labs Reviewed  CBC - Abnormal; Notable for the following:    WBC 13.8 (*)    Hemoglobin 11.5 (*)    HCT 33.1 (*)    All other components within normal limits  BASIC METABOLIC PANEL - Abnormal; Notable for the following:    Sodium 134 (*)    Glucose, Bld 251 (*)    BUN 27 (*)    Creatinine, Ser 1.69 (*)    GFR calc non Af Amer 33 (*)    GFR calc Af Amer 38 (*)    All other components within normal limits  CSF CELL COUNT WITH DIFFERENTIAL - Abnormal; Notable for the following:    RBC Count, CSF 11 (*)    WBC, CSF 6 (*)    All other components within normal limits  PROTEIN AND GLUCOSE, CSF - Abnormal; Notable for the following:    Glucose, CSF 147 (*)    All other components within normal limits  CSF CELL COUNT WITH DIFFERENTIAL - Abnormal; Notable for the following:    RBC Count, CSF 1 (*)    All other components within normal limits  GRAM STAIN  CSF CULTURE  HERPES SIMPLEX VIRUS(HSV) DNA BY PCR   Ct Head Wo Contrast  09/09/2011   *RADIOLOGY REPORT*  Clinical Data:  Headache, vomiting, dizziness, fever  CT HEAD WITHOUT CONTRAST  Technique:  Contiguous axial images were obtained from the base of the skull through the vertex without contrast  Comparison:  None.  Findings:  The brain has a normal appearance without evidence for hemorrhage, acute infarction, hydrocephalus, or mass lesion.  There is no extra axial fluid collection.  The skull and paranasal sinuses are normal.  IMPRESSION: Normal CT of the head without  contrast.  Original Report Authenticated By: Camelia Phenes, M.D.       MDM  Pt from PCP with suspicion of meningitis with HA, N/V, resolving fever, neck stiffness, apparent HSV blisters to lips. Although pt reports remote hx migraines, this feels not at all like her MHA. Basic labs ordered. CT head ordered. LP ordered with CSF studies to include HSV PCR. IVF bolus ordered. Will continue to monitor.       5:14 PM Preliminary CSF results not c/w meningitis. HA improving. Tachcyardia resolved. Nausea resolved, pt would like clear fluid trial. Given new renal insufficiency most likely attributable to dehydration, will place on CDU dehydration protocol. Trixie Dredge, PA will assume care of pt.  Shaaron Adler, [place] 09/09/11 [phone]

## 2011-09-09 NOTE — ED Provider Notes (Signed)
See prior note   Ward Givens, MD 09/09/11 [phone]

## 2011-09-09 NOTE — ED Notes (Signed)
Patient given water and ginger ale for po challenge.

## 2011-09-09 NOTE — ED Notes (Signed)
Pt sent here from pcp office due to headache, blisters on her lips, nausea, vomiting, dizziness, high fevers since 2/19. Reports body aches and stiff neck, was sent here to r/o meningitis. Mask on pt at triage.

## 2011-09-10 LAB — HERPES SIMPLEX VIRUS(HSV) DNA BY PCR
HSV 1 DNA: NOT DETECTED
HSV 2 DNA: NOT DETECTED

## 2011-09-10 NOTE — ED Provider Notes (Signed)
See CDU note   Ward Givens, MD 09/10/11 [phone]

## 2011-09-11 ENCOUNTER — Encounter (HOSPITAL_COMMUNITY): Payer: Self-pay | Admitting: *Deleted

## 2011-09-11 ENCOUNTER — Inpatient Hospital Stay (HOSPITAL_COMMUNITY)
Admission: EM | Admit: 2011-09-11 | Discharge: 2011-09-14 | DRG: 316 | Disposition: A | Discharge status: Home / Self Care | Payer: BC Managed Care – PPO | Attending: Family Medicine | Admitting: Family Medicine

## 2011-09-11 DIAGNOSIS — E785 Hyperlipidemia, unspecified: Secondary | ICD-10-CM | POA: Diagnosis present

## 2011-09-11 DIAGNOSIS — Z794 Long term (current) use of insulin: Secondary | ICD-10-CM

## 2011-09-11 DIAGNOSIS — F419 Anxiety disorder, unspecified: Secondary | ICD-10-CM | POA: Diagnosis present

## 2011-09-11 DIAGNOSIS — R51 Headache: Secondary | ICD-10-CM | POA: Diagnosis present

## 2011-09-11 DIAGNOSIS — E860 Dehydration: Secondary | ICD-10-CM | POA: Diagnosis present

## 2011-09-11 DIAGNOSIS — F411 Generalized anxiety disorder: Secondary | ICD-10-CM | POA: Diagnosis present

## 2011-09-11 DIAGNOSIS — R197 Diarrhea, unspecified: Secondary | ICD-10-CM | POA: Diagnosis present

## 2011-09-11 DIAGNOSIS — E669 Obesity, unspecified: Secondary | ICD-10-CM | POA: Diagnosis present

## 2011-09-11 DIAGNOSIS — Z6841 Body Mass Index (BMI) 40.0 and over, adult: Secondary | ICD-10-CM

## 2011-09-11 DIAGNOSIS — Z7982 Long term (current) use of aspirin: Secondary | ICD-10-CM

## 2011-09-11 DIAGNOSIS — J329 Chronic sinusitis, unspecified: Secondary | ICD-10-CM | POA: Diagnosis present

## 2011-09-11 DIAGNOSIS — E119 Type 2 diabetes mellitus without complications: Secondary | ICD-10-CM | POA: Diagnosis present

## 2011-09-11 DIAGNOSIS — N179 Acute kidney failure, unspecified: Secondary | ICD-10-CM | POA: Diagnosis present

## 2011-09-11 DIAGNOSIS — I10 Essential (primary) hypertension: Secondary | ICD-10-CM | POA: Diagnosis present

## 2011-09-11 DIAGNOSIS — J45909 Unspecified asthma, uncomplicated: Secondary | ICD-10-CM | POA: Diagnosis not present

## 2011-09-11 DIAGNOSIS — R739 Hyperglycemia, unspecified: Secondary | ICD-10-CM

## 2011-09-11 DIAGNOSIS — R112 Nausea with vomiting, unspecified: Secondary | ICD-10-CM | POA: Diagnosis present

## 2011-09-11 DIAGNOSIS — K219 Gastro-esophageal reflux disease without esophagitis: Secondary | ICD-10-CM | POA: Diagnosis present

## 2011-09-11 LAB — CBC
HCT: 28.5 % — ABNORMAL LOW (ref 36.0–46.0)
Hemoglobin: 9.8 g/dL — ABNORMAL LOW (ref 12.0–15.0)
MCH: 29.3 pg (ref 26.0–34.0)
MCHC: 34.4 g/dL (ref 30.0–36.0)
MCV: 85.3 fL (ref 78.0–100.0)
Platelets: 408 10*3/uL — ABNORMAL HIGH (ref 150–400)
RBC: 3.34 MIL/uL — ABNORMAL LOW (ref 3.87–5.11)
RDW: 13.5 % (ref 11.5–15.5)
WBC: 14.8 10*3/uL — ABNORMAL HIGH (ref 4.0–10.5)

## 2011-09-11 LAB — URINE MICROSCOPIC-ADD ON

## 2011-09-11 LAB — POCT I-STAT, CHEM 8
BUN: 34 mg/dL — ABNORMAL HIGH (ref 6–23)
Hemoglobin: 10.2 g/dL — ABNORMAL LOW (ref 12.0–15.0)
Sodium: 135 mEq/L (ref 135–145)
TCO2: 24 mmol/L (ref 0–100)

## 2011-09-11 LAB — URINALYSIS, ROUTINE W REFLEX MICROSCOPIC
Bilirubin Urine: NEGATIVE
Glucose, UA: 100 mg/dL — AB
Hgb urine dipstick: NEGATIVE
Ketones, ur: NEGATIVE mg/dL
Nitrite: NEGATIVE
Protein, ur: NEGATIVE mg/dL
Specific Gravity, Urine: 1.011 (ref 1.005–1.030)
Urobilinogen, UA: 0.2 mg/dL (ref 0.0–1.0)
pH: 5.5 (ref 5.0–8.0)

## 2011-09-11 MED ORDER — ONDANSETRON HCL 4 MG/2ML IJ SOLN
4.0000 mg | Freq: Once | INTRAMUSCULAR | Status: AC
  Administered 2011-09-11: 4 mg via INTRAVENOUS
  Filled 2011-09-11: qty 2

## 2011-09-11 MED ORDER — ONDANSETRON HCL 4 MG/2ML IJ SOLN: 4.0000 mg | Freq: Three times a day (TID) | INTRAMUSCULAR | Status: AC | PRN

## 2011-09-11 MED ORDER — ONDANSETRON 8 MG PO TBDP
8.0000 mg | ORAL_TABLET | Freq: Once | ORAL | Status: DC
  Filled 2011-09-11: qty 2

## 2011-09-11 MED ORDER — SODIUM CHLORIDE 0.9 % IV SOLN
INTRAVENOUS | Status: AC
Start: 2011-09-12 — End: 2011-09-12
  Administered 2011-09-12: 01:00:00 via INTRAVENOUS

## 2011-09-11 MED ORDER — MORPHINE SULFATE 4 MG/ML IJ SOLN
4.0000 mg | Freq: Once | INTRAMUSCULAR | Status: AC
  Administered 2011-09-12: 4 mg via INTRAVENOUS
  Filled 2011-09-11: qty 1

## 2011-09-11 MED ORDER — SODIUM CHLORIDE 0.9 % IV BOLUS (SEPSIS)
1000.0000 mL | Freq: Once | INTRAVENOUS | Status: AC
  Administered 2011-09-11: 1000 mL via INTRAVENOUS

## 2011-09-11 NOTE — ED Notes (Signed)
Pt walked up to [facility] Triage desk and asked RN for something to drink, pt was advised not to drink anything until seen by MD.  Pt  Bought bottle drink and is currently drinking it.

## 2011-09-11 NOTE — ED Notes (Signed)
Pt was seen by PCP today and sent to ED for further work up of flu like symptoms. Pt reports severe headache. Pt with dry mucous membranes. Reports nausea and vomiting has continued, with last episode this afternoon.

## 2011-09-11 NOTE — ED Provider Notes (Signed)
History     CSN: [id number]  Arrival date & time 09/11/11  1826   First MD Initiated Contact with Patient 09/11/11 2201      No chief complaint on file.   (Consider location/radiation/quality/duration/timing/severity/associated sxs/prior treatment) HPI Comments: Patient complains of a 2 week history of n/v, some diarrhea.  Had fever this past week to 104.  Workup with LP, cultures failed to reveal a cause.  Went to pcp today for follow up and cr was elevated and was told to come here.  Patient is a 56 y.o. female presenting with vomiting. The history is provided by the patient.  Emesis  This is a new problem. Episode onset: 2 weeks ago. The problem occurs 2 to 4 times per day. The problem has been gradually worsening. The emesis has an appearance of stomach contents. The maximum temperature recorded prior to her arrival was 103 to 104 F. Associated symptoms include chills and a fever. Pertinent negatives include no abdominal pain and no diarrhea.    Past Medical History  Diagnosis Date  . Diabetes mellitus   . Hypertension   . Hyperlipemia   . Asthma   . Acid reflux   . Anxiety   . Obesity     Past Surgical History  Procedure Date  . Abdominal hysterectomy   . Cholecystectomy     History reviewed. No pertinent family history.  History  Substance Use Topics  . Smoking status: Former Games developer  . Smokeless tobacco: Not on file  . Alcohol Use: Yes     occ    OB History    Grav Para Term Preterm Abortions TAB SAB Ect Mult Living                  Review of Systems  Constitutional: Positive for fever and chills.  Gastrointestinal: Positive for vomiting. Negative for abdominal pain and diarrhea.  All other systems reviewed and are negative.    Allergies  Lisinopril and Sulfa antibiotics  Home Medications   Current Outpatient Rx  Name Route Sig Dispense Refill  . ALPRAZOLAM 0.5 MG PO TABS Oral Take 0.5 mg by mouth 3 (three) times daily as needed. Anxiety    .  ASPIRIN 325 MG PO TABS Oral Take 325 mg by mouth daily.    . INSULIN GLARGINE 100 UNIT/ML [place] SOLN Subcutaneous Inject 80 Units into the skin daily.     Marland Kitchen LOSARTAN POTASSIUM-HCTZ 100-25 MG PO TABS Oral Take 1 tablet by mouth daily.    Marland Kitchen MONTELUKAST SODIUM 10 MG PO TABS Oral Take 10 mg by mouth at bedtime.    . OMEPRAZOLE 20 MG PO CPDR Oral Take 20 mg by mouth daily.    Marland Kitchen PROMETHAZINE HCL 25 MG PO TABS Oral Take 25 mg by mouth every 6 (six) hours as needed.      BP 130/70  Pulse 87  Temp(Src) 98 F (36.7 C) (Oral)  Resp 18  SpO2 96%  Physical Exam  Nursing note and vitals reviewed. Constitutional: She is oriented to person, place, and time. She appears well-developed and well-nourished. No distress.  HENT:  Head: Normocephalic and atraumatic.       Mm's dry.  Neck: Normal range of motion. Neck supple.  Cardiovascular: Normal rate and regular rhythm.  Exam reveals no gallop and no friction rub.   No murmur heard. Pulmonary/Chest: Effort normal and breath sounds normal. No respiratory distress. She has no wheezes.  Abdominal: Soft. Bowel sounds are normal. She exhibits no distension.  There is no tenderness.  Musculoskeletal: Normal range of motion.  Neurological: She is alert and oriented to person, place, and time.  Skin: Skin is warm and dry. She is not diaphoretic.    ED Course  Procedures (including critical care time)  Labs Reviewed  CBC - Abnormal; Notable for the following:    WBC 14.8 (*)    RBC 3.34 (*)    Hemoglobin 9.8 (*)    HCT 28.5 (*)    Platelets 408 (*)    All other components within normal limits  POCT I-STAT, CHEM 8 - Abnormal; Notable for the following:    BUN 34 (*)    Creatinine, Ser 3.50 (*)    Glucose, Bld 337 (*)    Hemoglobin 10.2 (*)    HCT 30.0 (*)    All other components within normal limits  URINALYSIS, ROUTINE W REFLEX MICROSCOPIC - Abnormal; Notable for the following:    Glucose, UA 100 (*)    Leukocytes, UA TRACE (*)    All other  components within normal limits  URINE MICROSCOPIC-ADD ON - Abnormal; Notable for the following:    Squamous Epithelial / LPF FEW (*)    Bacteria, UA FEW (*)    All other components within normal limits   No results found.   No diagnosis found.    MDM  Labs show acute renal failure.  Will consult medicine for admission.        Geoffery Lyons, MD 09/11/11 2351

## 2011-09-12 DIAGNOSIS — E669 Obesity, unspecified: Secondary | ICD-10-CM | POA: Diagnosis present

## 2011-09-12 DIAGNOSIS — E860 Dehydration: Secondary | ICD-10-CM | POA: Diagnosis present

## 2011-09-12 DIAGNOSIS — E119 Type 2 diabetes mellitus without complications: Secondary | ICD-10-CM | POA: Diagnosis present

## 2011-09-12 DIAGNOSIS — R197 Diarrhea, unspecified: Secondary | ICD-10-CM | POA: Diagnosis present

## 2011-09-12 DIAGNOSIS — F419 Anxiety disorder, unspecified: Secondary | ICD-10-CM | POA: Diagnosis present

## 2011-09-12 DIAGNOSIS — J45909 Unspecified asthma, uncomplicated: Secondary | ICD-10-CM | POA: Diagnosis not present

## 2011-09-12 DIAGNOSIS — K219 Gastro-esophageal reflux disease without esophagitis: Secondary | ICD-10-CM | POA: Diagnosis present

## 2011-09-12 DIAGNOSIS — I10 Essential (primary) hypertension: Secondary | ICD-10-CM | POA: Diagnosis present

## 2011-09-12 DIAGNOSIS — N179 Acute kidney failure, unspecified: Secondary | ICD-10-CM | POA: Diagnosis present

## 2011-09-12 DIAGNOSIS — R112 Nausea with vomiting, unspecified: Secondary | ICD-10-CM | POA: Diagnosis present

## 2011-09-12 DIAGNOSIS — E785 Hyperlipidemia, unspecified: Secondary | ICD-10-CM | POA: Diagnosis present

## 2011-09-12 LAB — BASIC METABOLIC PANEL WITH GFR
BUN: 28 mg/dL — ABNORMAL HIGH (ref 6–23)
CO2: 19 meq/L (ref 19–32)
Calcium: 8.3 mg/dL — ABNORMAL LOW (ref 8.4–10.5)
Chloride: 100 meq/L (ref 96–112)
Creatinine, Ser: 2.79 mg/dL — ABNORMAL HIGH (ref 0.50–1.10)
GFR calc Af Amer: 21 mL/min — ABNORMAL LOW
GFR calc non Af Amer: 18 mL/min — ABNORMAL LOW
Glucose, Bld: 263 mg/dL — ABNORMAL HIGH (ref 70–99)
Potassium: 4.1 meq/L (ref 3.5–5.1)
Sodium: 131 meq/L — ABNORMAL LOW (ref 135–145)

## 2011-09-12 LAB — IRON AND TIBC
Iron: 38 ug/dL — ABNORMAL LOW (ref 42–135)
Saturation Ratios: 17 % — ABNORMAL LOW (ref 20–55)
TIBC: 230 ug/dL — ABNORMAL LOW (ref 250–470)
UIBC: 192 ug/dL (ref 125–400)

## 2011-09-12 LAB — HEMOGLOBIN A1C
Hgb A1c MFr Bld: 11.7 % — ABNORMAL HIGH
Mean Plasma Glucose: 289 mg/dL — ABNORMAL HIGH

## 2011-09-12 LAB — RETICULOCYTES
RBC.: 3.11 MIL/uL — ABNORMAL LOW (ref 3.87–5.11)
Retic Count, Absolute: 93.3 K/uL (ref 19.0–186.0)
Retic Ct Pct: 3 % (ref 0.4–3.1)

## 2011-09-12 LAB — VITAMIN B12: Vitamin B-12: 623 pg/mL (ref 211–911)

## 2011-09-12 LAB — GLUCOSE, CAPILLARY
Glucose-Capillary: 253 mg/dL — ABNORMAL HIGH (ref 70–99)
Glucose-Capillary: 174 mg/dL — ABNORMAL HIGH (ref 70–99)

## 2011-09-12 LAB — FOLATE: Folate: 8.3 ng/mL

## 2011-09-12 MED ORDER — ACETAMINOPHEN 325 MG PO TABS: 650.0000 mg | ORAL_TABLET | Freq: Four times a day (QID) | ORAL | Status: DC | PRN

## 2011-09-12 MED ORDER — ONDANSETRON HCL 4 MG PO TABS: 4.0000 mg | ORAL_TABLET | Freq: Four times a day (QID) | ORAL | Status: DC | PRN

## 2011-09-12 MED ORDER — HYDROMORPHONE HCL PF 1 MG/ML IJ SOLN: 0.5000 mg | INTRAMUSCULAR | Status: DC | PRN

## 2011-09-12 MED ORDER — ONDANSETRON HCL 4 MG/2ML IJ SOLN
4.0000 mg | Freq: Four times a day (QID) | INTRAMUSCULAR | Status: DC | PRN
  Administered 2011-09-12: 4 mg via INTRAVENOUS
  Filled 2011-09-12: qty 2

## 2011-09-12 MED ORDER — ENOXAPARIN SODIUM 40 MG/0.4ML ~~LOC~~ SOLN
40.0000 mg | SUBCUTANEOUS | Status: DC
  Administered 2011-09-12 – 2011-09-14 (×3): 40 mg via SUBCUTANEOUS
  Filled 2011-09-12 (×3): qty 0.4

## 2011-09-12 MED ORDER — MORPHINE SULFATE 2 MG/ML IJ SOLN
2.0000 mg | Freq: Once | INTRAMUSCULAR | Status: AC
  Administered 2011-09-12: 2 mg via INTRAVENOUS
  Filled 2011-09-12: qty 1

## 2011-09-12 MED ORDER — PANTOPRAZOLE SODIUM 40 MG PO TBEC
40.0000 mg | DELAYED_RELEASE_TABLET | Freq: Every day | ORAL | Status: DC
  Administered 2011-09-12 – 2011-09-14 (×3): 40 mg via ORAL
  Filled 2011-09-12 (×3): qty 1

## 2011-09-12 MED ORDER — SODIUM CHLORIDE 0.9 % IV SOLN
INTRAVENOUS | Status: DC
  Administered 2011-09-12 – 2011-09-14 (×3): via INTRAVENOUS

## 2011-09-12 MED ORDER — ZOLPIDEM TARTRATE 5 MG PO TABS: 5.0000 mg | ORAL_TABLET | Freq: Every evening | ORAL | Status: DC | PRN

## 2011-09-12 MED ORDER — ACETAMINOPHEN 650 MG RE SUPP: 650.0000 mg | Freq: Four times a day (QID) | RECTAL | Status: DC | PRN

## 2011-09-12 MED ORDER — SODIUM CHLORIDE 0.9 % IV SOLN
INTRAVENOUS | Status: DC
  Administered 2011-09-12: 05:00:00 via INTRAVENOUS

## 2011-09-12 MED ORDER — PROMETHAZINE HCL 25 MG/ML IJ SOLN
12.5000 mg | Freq: Four times a day (QID) | INTRAMUSCULAR | Status: DC | PRN
  Administered 2011-09-14: 12.5 mg via INTRAVENOUS
  Filled 2011-09-12: qty 1

## 2011-09-12 MED ORDER — ALUM & MAG HYDROXIDE-SIMETH 200-200-20 MG/5ML PO SUSP
30.0000 mL | Freq: Four times a day (QID) | ORAL | Status: DC | PRN
  Administered 2011-09-12: 30 mL via ORAL
  Filled 2011-09-12: qty 30

## 2011-09-12 MED ORDER — OXYCODONE HCL 5 MG PO TABS
5.0000 mg | ORAL_TABLET | ORAL | Status: DC | PRN
  Administered 2011-09-12 – 2011-09-14 (×5): 5 mg via ORAL
  Filled 2011-09-12 (×5): qty 1

## 2011-09-12 MED ORDER — WHITE PETROLATUM GEL
Status: AC
  Administered 2011-09-12: 21:00:00
  Filled 2011-09-12: qty 5

## 2011-09-12 MED ORDER — PROMETHAZINE HCL 25 MG/ML IJ SOLN
12.5000 mg | Freq: Once | INTRAMUSCULAR | Status: AC
  Administered 2011-09-12: 12.5 mg via INTRAVENOUS
  Filled 2011-09-12: qty 1

## 2011-09-12 MED ORDER — INSULIN ASPART 100 UNIT/ML ~~LOC~~ SOLN
0.0000 [IU] | Freq: Three times a day (TID) | SUBCUTANEOUS | Status: DC
  Administered 2011-09-12: 5 [IU] via SUBCUTANEOUS
  Administered 2011-09-13: 3 [IU] via SUBCUTANEOUS
  Administered 2011-09-13: 2 [IU] via SUBCUTANEOUS
  Administered 2011-09-13 – 2011-09-14 (×3): 3 [IU] via SUBCUTANEOUS
  Filled 2011-09-12: qty 3

## 2011-09-12 NOTE — H&P (Signed)
DATE OF ADMISSION:  09/12/2011  PCP:   Lupita Raider, MD, MD   Chief Complaint: Nausea vomiting and Diarrhea, fevers and headaches  HPI: Shelly [name] is an 56 y.o. female who presents to the ED with complaints of illness for the past 10 days.  She state she initially had sever headache fever and nausea.    She was seen at an area [facility] and given a Z-pack and phenergan for her symptoms.  She saw her regular doctor the next day who sent her for a CT scan of the brain and a lumbar puncture was done to evalauate for possible viral meningitis, the results of which were negative at that time. She states that she developed vomiting and diarrhea thereafter, and was not able to hold down foods or liquids.  In the Ed she was evaluated and found to have an elevated Bun/Cr and was started on IV fluids to rehydrate.  She was referred for medical admission.     Past Medical History  Diagnosis Date  . Diabetes mellitus   . Hypertension   . Hyperlipemia   . Asthma   . Acid reflux   . Anxiety   . Obesity     Past Surgical History  Procedure Date  . Abdominal hysterectomy   . Cholecystectomy     Medications:  HOME MEDS: Prior to Admission medications   Medication Sig Start Date End Date Taking? Authorizing Provider  ALPRAZolam Prudy Feeler) 0.5 MG tablet Take 0.5 mg by mouth 3 (three) times daily as needed. Anxiety   Yes Historical Provider, MD  aspirin 325 MG tablet Take 325 mg by mouth daily.   Yes Historical Provider, MD  insulin glargine (LANTUS) 100 UNIT/ML injection Inject 80 Units into the skin daily.    Yes Historical Provider, MD  losartan-hydrochlorothiazide (HYZAAR) 100-25 MG per tablet Take 1 tablet by mouth daily.   Yes Historical Provider, MD  montelukast (SINGULAIR) 10 MG tablet Take 10 mg by mouth at bedtime.   Yes Historical Provider, MD  omeprazole (PRILOSEC) 20 MG capsule Take 20 mg by mouth daily.   Yes Historical Provider, MD  promethazine (PHENERGAN) 25 MG tablet Take 25 mg by  mouth every 6 (six) hours as needed.   Yes Historical Provider, MD    Allergies:  Allergies  Allergen Reactions  . Lisinopril Cough  . Sulfa Antibiotics Nausea And Vomiting  . Dilaudid (Hydromorphone Hcl) Anxiety and Other (See Comments)    paranoia    Social History:   reports that she has quit smoking. She does not have any smokeless tobacco history on file. She reports that she drinks alcohol. She reports that she does not use illicit drugs.  Family History: History reviewed. No pertinent family history.  Review of Systems:  The patient denies vision loss, decreased hearing, hoarseness, chest pain, syncope, dyspnea on exertion, peripheral edema, balance deficits, hemoptysis, melena, hematochezia, severe indigestion/heartburn, hematuria, incontinence, genital sores, suspicious skin lesions, transient blindness, difficulty walking, depression, unusual weight change, abnormal bleeding, enlarged lymph nodes, angioedema, and breast masses.   Physical Exam:  GEN:  Pleasant  56 year old Obese Caucasian female  examined  and in no acute distress; cooperative with exam Filed Vitals:   09/11/11 2133 09/12/11 0042 09/12/11 0100 09/12/11 0454  BP: 130/70 121/58 124/76 107/72  Pulse: 87 77 82 71  Temp: 98 F (36.7 C)  97.7 F (36.5 C) 97.8 F (36.6 C)  TempSrc: Oral  Oral Oral  Resp: 18 18 20 14   Height:  5\' 2"  (1.575 m)   Weight:   107.502 kg (237 lb)   SpO2: 96% 100% 98% 98%   Blood pressure 107/72, pulse 71, temperature 97.8 F (36.6 C), temperature source Oral, resp. rate 14, height 5\' 2"  (1.575 m), weight 107.502 kg (237 lb), SpO2 98.00%. PSYCH: She is alert and oriented x4; does not appear anxious does not appear depressed; affect is normal HEENT: Normocephalic and Atraumatic, Mucous membranes pink; PERRLA; EOM intact; Fundi:  Benign;  No scleral icterus, Nares: Patent, Oropharynx: Clear, Fair Dentition, Neck:  FROM, no cervical lymphadenopathy nor thyromegaly or carotid bruit;  no JVD; Breasts:: Not examined CHEST WALL: No tenderness CHEST: Normal respiration, clear to auscultation bilaterally HEART: Regular rate and rhythm; no murmurs rubs or gallops BACK: No kyphosis or scoliosis; no CVA tenderness ABDOMEN: Positive Bowel Sounds,Obese, soft non-tender; no masses, no organomegaly,Rectal Exam: Not done EXTREMITIES: No bone or joint deformity; age-appropriate arthropathy of the hands and knees; no cyanosis, clubbing or edema; no ulcerations. Genitalia: not examined PULSES: 2+ and symmetric SKIN: Normal hydration no rash or ulceration CNS: Cranial nerves 2-12 grossly intact no focal neurologic deficit   Labs & Imaging Results for orders placed during the hospital encounter of 09/11/11 (from the past 48 hour(s))  CBC     Status: Abnormal   Collection Time   09/11/11  9:07 PM      Component Value Range Comment   WBC 14.8 (*) 4.0 - 10.5 (K/uL)    RBC 3.34 (*) 3.87 - 5.11 (MIL/uL)    Hemoglobin 9.8 (*) 12.0 - 15.0 (g/dL)    HCT 16.1 (*) 09.6 - 46.0 (%)    MCV 85.3  78.0 - 100.0 (fL)    MCH 29.3  26.0 - 34.0 (pg)    MCHC 34.4  30.0 - 36.0 (g/dL)    RDW 04.5  40.9 - 81.1 (%)    Platelets 408 (*) 150 - 400 (K/uL)   POCT I-STAT, CHEM 8     Status: Abnormal   Collection Time   09/11/11  9:25 PM      Component Value Range Comment   Sodium 135  135 - 145 (mEq/L)    Potassium 4.0  3.5 - 5.1 (mEq/L)    Chloride 103  96 - 112 (mEq/L)    BUN 34 (*) 6 - 23 (mg/dL)    Creatinine, Ser 9.14 (*) 0.50 - 1.10 (mg/dL)    Glucose, Bld 782 (*) 70 - 99 (mg/dL)    Calcium, Ion 9.56  1.12 - 1.32 (mmol/L)    TCO2 24  0 - 100 (mmol/L)    Hemoglobin 10.2 (*) 12.0 - 15.0 (g/dL)    HCT 21.3 (*) 08.6 - 46.0 (%)   URINALYSIS, ROUTINE W REFLEX MICROSCOPIC     Status: Abnormal   Collection Time   09/11/11 10:07 PM      Component Value Range Comment   Color, Urine YELLOW  YELLOW     APPearance CLEAR  CLEAR     Specific Gravity, Urine 1.011  1.005 - 1.030     pH 5.5  5.0 - 8.0      Glucose, UA 100 (*) NEGATIVE (mg/dL)    Hgb urine dipstick NEGATIVE  NEGATIVE     Bilirubin Urine NEGATIVE  NEGATIVE     Ketones, ur NEGATIVE  NEGATIVE (mg/dL)    Protein, ur NEGATIVE  NEGATIVE (mg/dL)    Urobilinogen, UA 0.2  0.0 - 1.0 (mg/dL)    Nitrite NEGATIVE  NEGATIVE  Leukocytes, UA TRACE (*) NEGATIVE    URINE MICROSCOPIC-ADD ON     Status: Abnormal   Collection Time   09/11/11 10:07 PM      Component Value Range Comment   Squamous Epithelial / LPF FEW (*) RARE     WBC, UA 7-10  <3 (WBC/hpf)    RBC / HPF 0-2  <3 (RBC/hpf)    Bacteria, UA FEW (*) RARE     No results found.    Assessment/Plan: Present on Admission:  .Dehydration .ARF (acute renal failure) .Diarrhea .Nausea and vomiting .Hyperlipemia .Hypertension .Acid reflux .Anxiety .Obesity .Diabetes mellitus Anemia   Plan:   Admit to Med/Surg Bed Eneteric Precautions, and Check C.diff IVFs for Fluid resuscitation and maintenance. Anti-Emetics PRN Pain Control PRN Monitor Bun/Cr and correct electrolytes prn. Check Anemia panel.   DVT Prophylaxis Other plans as per orders.     CODE STATUS:      FULL CODE         Siah Steely C 09/12/2011, 5:22 AM

## 2011-09-12 NOTE — Progress Notes (Signed)
Shelly [name] [id number] Code Status: Full Admission Data: 09/12/2011 1:46 AM Attending Provider:  Lama, G.  NWG:NFAO,[id number], MD, MD Consults/ Treatment Team:    Shelly [name] is a 56 y.o. female patient admitted from ED awake, alert - oriented  X 3 - no acute distress noted.  VSS - Blood pressure 124/76, pulse 82, temperature 97.7 F (36.5 C), temperature source Oral, resp. rate 20, height 5\' 2"  (1.575 m), weight 107.502 kg (237 lb), SpO2 98.00%.  no c/o shortness of breath, no c/o chest pain.   IV Fluids:  IV in place, occlusive dsg intact without redness, IV cath hand left, condition patent and no redness normal saline.  Allergies:   Allergies  Allergen Reactions  . Lisinopril Cough  . Sulfa Antibiotics Nausea And Vomiting  . Dilaudid (Hydromorphone Hcl) Anxiety and Other (See Comments)    paranoia     Past Medical History  Diagnosis Date  . Diabetes mellitus   . Hypertension   . Hyperlipemia   . Asthma   . Acid reflux   . Anxiety   . Obesity    Medications Prior to Admission  Medication Dose Route Frequency Provider Last Rate Last Dose  . 0.9 %  sodium chloride infusion   Intravenous STAT Geoffery Lyons, MD 100 mL/hr at 09/12/11 0051    . morphine 4 MG/ML injection 4 mg  4 mg Intravenous Once Geoffery Lyons, MD   4 mg at 09/12/11 0001  . ondansetron (ZOFRAN) injection 4 mg  4 mg Intravenous Once Raeford Razor, MD   4 mg at 09/11/11 2254  . ondansetron (ZOFRAN) injection 4 mg  4 mg Intravenous Q8H PRN Geoffery Lyons, MD      . ondansetron (ZOFRAN-ODT) disintegrating tablet 8 mg  8 mg Oral Once Raeford Razor, MD      . sodium chloride 0.9 % bolus 1,000 mL  1,000 mL Intravenous Once Raeford Razor, MD   1,000 mL at 09/11/11 2254   Medications Prior to Admission  Medication Sig Dispense Refill  . ALPRAZolam (XANAX) 0.5 MG tablet Take 0.5 mg by mouth 3 (three) times daily as needed. Anxiety      . insulin glargine (LANTUS) 100 UNIT/ML injection Inject 80 Units into the skin  daily.       Marland Kitchen losartan-hydrochlorothiazide (HYZAAR) 100-25 MG per tablet Take 1 tablet by mouth daily.      . montelukast (SINGULAIR) 10 MG tablet Take 10 mg by mouth at bedtime.      Marland Kitchen omeprazole (PRILOSEC) 20 MG capsule Take 20 mg by mouth daily.       History:  obtained from the patient. Tobacco/alcohol: denied none  Orientation to room, and floor completed with information packet given to patient/family.  Patient declined safety video at this time.  Admission INP armband ID verified with patient/family, and in place.   SR up x 2, fall assessment complete, with patient and family able to verbalize understanding of risk associated with falls, and verbalized understanding to call nsg before up out of bed.  Call light within reach, patient able to voice, and demonstrate understanding.  Skin, clean-dry- intact without evidence of bruising, or skin tears.   No evidence of skin break down noted on exam.     Will cont to eval and treat per MD orders.  Debbora Presto, [place] 09/12/2011 1:46 AM

## 2011-09-12 NOTE — Progress Notes (Signed)
Subjective: Patient seen and examined, admitted with nausea, vomiting and diarrhea. Started on IV fluids, she denies any complaints at this time, no headache, no nausea, vomiting or diarrhea.  Objective: Vital signs in last 24 hours: Temp:  [97.2 F (36.2 C)-98 F (36.7 C)] 97.6 F (36.4 C) (03/02 1500) Pulse Rate:  [70-87] 70  (03/02 1500) Resp:  [14-20] 16  (03/02 1500) BP: (100-130)/(58-76) 102/70 mmHg (03/02 1500) SpO2:  [94 %-100 %] 98 % (03/02 1500) Weight:  [107.502 kg (237 lb)] 107.502 kg (237 lb) (03/02 0100) Weight change:  Last BM Date: 09/11/11  Intake/Output from previous day:   Total I/O In: -  Out: 1 [Urine:1]   Physical Exam: HEENT: Atraumatic, normocephalic Neck: Supple Chest : Clear to auscultation bilaterally, no wheezing, no crackles Heart : S1 S2 Regular, no murmurs Abdomen: Soft, Nontender, no organomegaly Ext : No cyanosis, clubbing, edema   Lab Results: Basic Metabolic Panel:  Basename 09/11/11 2125  NA 135  K 4.0  CL 103  CO2 --  GLUCOSE 337*  BUN 34*  CREATININE 3.50*  CALCIUM --  MG --  PHOS --     Basename 09/11/11 2125 09/11/11 2107  WBC -- 14.8*  NEUTROABS -- --  HGB 10.2* 9.8*  HCT 30.0* 28.5*  MCV -- 85.3  PLT -- 408*   Anemia Panel:  Basename 09/12/11 0829  VITAMINB12 623  FOLATE 8.3  FERRITIN 280  TIBC 230*  IRON 38*  RETICCTPCT 3.0   Coagulation: No results found for this basename: LABPROT:2,INR:2 in the last 72 hours Urine Drug Screen: Drugs of Abuse  No results found for this basename: labopia, cocainscrnur, labbenz, amphetmu, thcu, labbarb    Alcohol Level: No results found for this basename: ETH:2 in the last 72 hours Urinalysis:  Basename 09/11/11 2207  COLORURINE YELLOW  LABSPEC 1.011  PHURINE 5.5  GLUCOSEU 100*  HGBUR NEGATIVE  BILIRUBINUR NEGATIVE  KETONESUR NEGATIVE  PROTEINUR NEGATIVE  UROBILINOGEN 0.2  NITRITE NEGATIVE  LEUKOCYTESUR TRACE*   Misc. Labs:  Recent Results (from the  past 240 hour(s))  CSF CULTURE     Status: Normal (Preliminary result)   Collection Time   09/09/11  3:30 PM      Component Value Range Status Comment   Specimen Description CSF   Final    Special Requests TUBE 2 @2CC    Final    Gram Stain     Final    Value: CYTOSPIN WBC PRESENT, PREDOMINANTLY MONONUCLEAR     NO ORGANISMS SEEN     Performed at [facility]   Culture NO GROWTH 2 DAYS   Final    Report Status PENDING   Incomplete   GRAM STAIN     Status: Normal   Collection Time   09/09/11  3:30 PM      Component Value Range Status Comment   Specimen Description CSF   Final    Special Requests TUBE 2@2CC    Final    Gram Stain     Final    Value: CYTOSPIN PREP     WBC PRESENT, PREDOMINANTLY MONONUCLEAR     NO ORGANISMS SEEN   Report Status 09/09/2011 FINAL   Final     Studies/Results: No results found.  Medications: Scheduled Meds:   . sodium chloride   Intravenous STAT  . enoxaparin  40 mg Subcutaneous Q24H  . insulin aspart  0-9 Units Subcutaneous TID WC  .  morphine injection  2 mg Intravenous Once  .  morphine  injection  4 mg Intravenous Once  . ondansetron (ZOFRAN) IV  4 mg Intravenous Once  . ondansetron  8 mg Oral Once  . promethazine  12.5 mg Intravenous Once  . sodium chloride  1,000 mL Intravenous Once   Continuous Infusions:   . sodium chloride    . DISCONTD: sodium chloride 100 mL/hr at 09/12/11 0502   PRN Meds:.acetaminophen, acetaminophen, alum & mag hydroxide-simeth, ondansetron (ZOFRAN) IV, ondansetron (ZOFRAN) IV, ondansetron, oxyCODONE, zolpidem, DISCONTD: HYDROmorphone  Assessment/Plan:   Dehydration Continue IV f;uids.  Acute Renal failure: ? ATN, Prerenal. Will repeat BMP today  Cr last night was 3.50 Insert foley catheter  Diarrhea Resolved Stool for c diff is pending   Nausea and vomiting Improved.  Leukocytosis: Sec to dehydration   Diabetes mellitus SSI  DVT prophylaxis: Lovenox    LOS: 1 day   [facility]  S [facility] Pager: [phone] 09/12/2011, 4:41 PM

## 2011-09-13 LAB — CBC
HCT: 26.3 % — ABNORMAL LOW (ref 36.0–46.0)
Hemoglobin: 8.8 g/dL — ABNORMAL LOW (ref 12.0–15.0)
MCH: 29.5 pg (ref 26.0–34.0)
MCHC: 33.5 g/dL (ref 30.0–36.0)
MCV: 88.3 fL (ref 78.0–100.0)
Platelets: 322 10*3/uL (ref 150–400)
RBC: 2.98 MIL/uL — ABNORMAL LOW (ref 3.87–5.11)
RDW: 13.6 % (ref 11.5–15.5)
WBC: 10.3 10*3/uL (ref 4.0–10.5)

## 2011-09-13 LAB — BASIC METABOLIC PANEL
BUN: 27 mg/dL — ABNORMAL HIGH (ref 6–23)
CO2: 21 mEq/L (ref 19–32)
Calcium: 8.3 mg/dL — ABNORMAL LOW (ref 8.4–10.5)
Chloride: 105 mEq/L (ref 96–112)
Creatinine, Ser: 2.37 mg/dL — ABNORMAL HIGH (ref 0.50–1.10)
GFR calc Af Amer: 25 mL/min — ABNORMAL LOW (ref >90)
GFR calc non Af Amer: 22 mL/min — ABNORMAL LOW (ref >90)
Glucose, Bld: 187 mg/dL — ABNORMAL HIGH (ref 70–99)
Potassium: 4.4 mEq/L (ref 3.5–5.1)
Sodium: 135 mEq/L (ref 135–145)

## 2011-09-13 LAB — CSF CULTURE W GRAM STAIN

## 2011-09-13 LAB — GLUCOSE, CAPILLARY
Glucose-Capillary: 207 mg/dL — ABNORMAL HIGH (ref 70–99)
Glucose-Capillary: 241 mg/dL — ABNORMAL HIGH (ref 70–99)
Glucose-Capillary: 299 mg/dL — ABNORMAL HIGH (ref 70–99)

## 2011-09-13 LAB — VITAMIN B12: Vitamin B-12: 628 pg/mL (ref 211–911)

## 2011-09-13 LAB — IRON AND TIBC: Iron: 37 ug/dL — ABNORMAL LOW (ref 42–135)

## 2011-09-13 LAB — FOLATE: Folate: 7.7 ng/mL

## 2011-09-13 LAB — RETICULOCYTES: Retic Count, Absolute: 88.7 10*3/uL (ref 19.0–186.0)

## 2011-09-13 LAB — CLOSTRIDIUM DIFFICILE BY PCR: Toxigenic C. Difficile by PCR: NEGATIVE

## 2011-09-13 LAB — FERRITIN: Ferritin: 260 ng/mL (ref 10–291)

## 2011-09-13 NOTE — Progress Notes (Signed)
Subjective: Patient seen and examined, feels better. Renal function has improved. Vital signs in last 24 hours: Temp:  [97.6 F (36.4 C)-98 F (36.7 C)] 97.8 F (36.6 C) (03/03 0548) Pulse Rate:  [67-81] 81  (03/03 0548) Resp:  [16] 16  (03/03 0548) BP: (94-136)/(57-83) 136/83 mmHg (03/03 0548) SpO2:  [93 %-98 %] 95 % (03/03 0548) Weight change:  Last BM Date: 09/12/11  Intake/Output from previous day: 03/02 0701 - 03/03 0700 In: 480 [P.O.:480] Out: 2901 [Urine:2901]     Physical Exam: HEENT: Atraumatic, normocephalic Neck: Supple Chest : Clear to auscultation bilaterally, no wheezing, no crackles Heart : S1 S2 Regular, no murmurs Abdomen: Soft, Nontender, no organomegaly Ext : No cyanosis, clubbing, edema   Lab Results: Basic Metabolic Panel:  Basename 09/13/11 0621 09/12/11 1748  NA 135 131*  K 4.4 4.1  CL 105 100  CO2 21 19  GLUCOSE 187* 263*  BUN 27* 28*  CREATININE 2.37* 2.79*  CALCIUM 8.3* 8.3*  MG -- --  PHOS -- --     Basename 09/13/11 0621 09/11/11 2125 09/11/11 2107  WBC 10.3 -- 14.8*  NEUTROABS -- -- --  HGB 8.8* 10.2* --  HCT 26.3* 30.0* --  MCV 88.3 -- 85.3  PLT 322 -- 408*   Anemia Panel:  Basename 09/12/11 0829  VITAMINB12 623  FOLATE 8.3  FERRITIN 280  TIBC 230*  IRON 38*  RETICCTPCT 3.0   Urinalysis:  Basename 09/11/11 2207  COLORURINE YELLOW  LABSPEC 1.011  PHURINE 5.5  GLUCOSEU 100*  HGBUR NEGATIVE  BILIRUBINUR NEGATIVE  KETONESUR NEGATIVE  PROTEINUR NEGATIVE  UROBILINOGEN 0.2  NITRITE NEGATIVE  LEUKOCYTESUR TRACE*   Misc. Labs:  Recent Results (from the past 240 hour(s))  CSF CULTURE     Status: Normal   Collection Time   09/09/11  3:30 PM      Component Value Range Status Comment   Specimen Description CSF   Final    Special Requests TUBE 2 @2CC    Final    Gram Stain     Final    Value: CYTOSPIN WBC PRESENT, PREDOMINANTLY MONONUCLEAR     NO ORGANISMS SEEN     Performed at [facility]   Culture NO  GROWTH 3 DAYS   Final    Report Status 09/13/2011 FINAL   Final   GRAM STAIN     Status: Normal   Collection Time   09/09/11  3:30 PM      Component Value Range Status Comment   Specimen Description CSF   Final    Special Requests TUBE 2@2CC    Final    Gram Stain     Final    Value: CYTOSPIN PREP     WBC PRESENT, PREDOMINANTLY MONONUCLEAR     NO ORGANISMS SEEN   Report Status 09/09/2011 FINAL   Final     Studies/Results: No results found.  Medications: Scheduled Meds:    . enoxaparin  40 mg Subcutaneous Q24H  . insulin aspart  0-9 Units Subcutaneous TID WC  . ondansetron  8 mg Oral Once  . pantoprazole  40 mg Oral Q1200  . white petrolatum       Continuous Infusions:    . sodium chloride 100 mL/hr at 09/13/11 0851   PRN Meds:.acetaminophen, acetaminophen, alum & mag hydroxide-simeth, ondansetron (ZOFRAN) IV, ondansetron, oxyCODONE, promethazine, zolpidem  Assessment/Plan:  Anemia; ? Dilutional Check anemia panel, stool for occult blood.  Dehydration Continue IV fluids.  Acute Renal failure: ? ATN, Prerenal.  Creatinine today is 2.37 Foley catheter  Headache Improving, sec to dehydration Continue Oxycodone prn  Diarrhea Resolved Stool for c diff is pending   Nausea and vomiting Improved.  Leukocytosis: resolved Sec to dehydration  Diabetes mellitus SSI  DVT prophylaxis: Lovenox    LOS: 2 days   [facility] Pager: [phone] 09/13/2011, 1:51 PM

## 2011-09-14 LAB — BASIC METABOLIC PANEL
CO2: 22 mEq/L (ref 19–32)
Calcium: 8.5 mg/dL (ref 8.4–10.5)
GFR calc non Af Amer: 31 mL/min — ABNORMAL LOW (ref >90)
Sodium: 135 mEq/L (ref 135–145)

## 2011-09-14 LAB — CBC
MCH: 29.9 pg (ref 26.0–34.0)
Platelets: 320 10*3/uL (ref 150–400)
RBC: 2.91 MIL/uL — ABNORMAL LOW (ref 3.87–5.11)

## 2011-09-14 MED ORDER — ASPIRIN EC 81 MG PO TBEC: 81.0000 mg | DELAYED_RELEASE_TABLET | Freq: Every day | ORAL | Status: AC

## 2011-09-14 MED ORDER — AMLODIPINE BESYLATE 5 MG PO TABS: 5.0000 mg | ORAL_TABLET | Freq: Every day | ORAL | Status: DC

## 2011-09-14 MED ORDER — FERROUS GLUCONATE IRON 246 (28 FE) MG PO TABS: 246.0000 mg | ORAL_TABLET | Freq: Every day | ORAL | Status: DC

## 2011-09-14 MED ORDER — FLUTICASONE PROPIONATE 50 MCG/ACT NA SUSP: 2.0000 | Freq: Every day | NASAL | Status: DC

## 2011-09-14 NOTE — Discharge Summary (Signed)
Shelly [name], 56 y.o., DOB May 22, 1956, MRN [id number]. Admission date: 09/11/2011 Discharge Date 09/14/2011 Primary MD Lupita Raider, MD, MD Admitting Physician Ron Parker, MD  Admission Diagnosis  Dehydration [276.51] Acute renal failure [584.9] Hyperglycemia [790.29] Dehydration  Discharge Diagnosis   Principal Problem:  *Dehydration Active Problems:  Hypertension  Hyperlipemia  Acid reflux  Anxiety  Obesity  ARF (acute renal failure)  Diarrhea  Nausea and vomiting  Diabetes mellitus  Asthma   Past Medical History  Diagnosis Date  . Diabetes mellitus   . Hypertension   . Hyperlipemia   . Asthma   . Acid reflux   . Anxiety   . Obesity     Past Surgical History  Procedure Date  . Abdominal hysterectomy   . Cholecystectomy     Brief History and Physical Shelly [name] is an 56 y.o. female who presents to the ED with complaints of illness for the past 10 days. She state she initially had sever headache fever and nausea. She was seen at an area [facility] and given a Z-pack and phenergan for her symptoms. She saw her regular doctor the next day who sent her for a CT scan of the brain and a lumbar puncture was done to evalauate for possible viral meningitis, the results of which were negative at that time. She states that she developed vomiting and diarrhea thereafter, and was not able to hold down foods or liquids. In the Ed she was evaluated and found to have an elevated Bun/Cr and was started on IV fluids to rehydrate. She was referred for medical admission.   Hospital Course  Dehydration Patient was admitted with severe dehydration with elevated BUN creatinine, was given IV fluids and her renal functions have improved. Patient will need a repeat BMP in one week.  Acute renal failure Patient has been on losartan/HCTZ 100/25, at home and due to this recent illness she developed acute renal failure. Her creatinine went up to 3.50 which has improved after giving IV  fluids and on the day of discharge her creatinine is down to 1.78. I'm going to stop the above medication as it can worsen her renal failure.  Hypertension Patient was on losartan/HCTZ, I'm going to stop this medication and start her on Norvasc 5 mg by mouth daily. Patient to check her blood pressure at home and blood pressure is not controlled Norvasc she should follow up with Dr. Cam Hai her primary care provider to add another medication.  Leukocytosis Patient had original white count in the hospital, which is now resolved. All the cultures are negative patient was afebrile during the hospital stay. Most likely the white count elevation was reactive.  Anemia Anemia panel was done and patient was found to have low iron stores, I won't start patient on ferrous gluconate 246 mg tablet daily with breakfast  Sinusitis/allergies Patient has maxillary tenderness and also has nasal discharge we'll start the patient on Flonase nasal spray  Headache Patient had lumbar puncture done in the hospital before she was admitted 2 days before her admission. The lumbar puncture results were negative. Culture growth was negative. Headache is probably due to the meds or a sinusitis and severe dehydration which is now resolved.  Patient was taking full dose aspirin, I have cut down aspirin to 81 mg enteric-coated by mouth daily  Diarrhea Diarrhea resolved, stool for C. difficile was negative stool culture has been negative.  Consults    Significant Tests:  See full reports for all details  Ct Head Wo Contrast  09/09/2011    IMPRESSION: Normal CT of the head without contrast.  Original Report Authenticated By: Camelia Phenes, M.D.   Dg Lumbar Puncture Fluoro Guide  09/09/2011  *RADIOLOGY REPORT*  Clinical Data:  Next if this and vomiting  DIAGNOSTIC LUMBAR PUNCTURE UNDER FLUOROSCOPIC GUIDANCE  Fluoroscopy time:  1  minutes.  Technique:  Informed consent was obtained from the patient prior to the  procedure, including potential complications of headache, allergy, and pain.   With the patient prone, the lower back was prepped with Betadine.  1% Lidocaine was used for local anesthesia. Lumbar puncture was performed at the L2-L3 level using a 20 gauge needle with return of clear CSF with an opening pressure of 17 cm water.   10 ml of CSF were obtained for laboratory studies.  The patient tolerated the procedure well and there were no apparent complications.  IMPRESSION:  Successful lumbar puncture.  Original Report Authenticated By: Genevive Bi, M.D.     Today   Subjective:   Shelly [name] today has no headache,no chest abdominal pain, c/o nasal discharge has nasal allergies and want Flonase   Objective:   Blood pressure 135/73, pulse 75, temperature 98.1 F (36.7 C), temperature source Oral, resp. rate 19, height 5\' 2"  (1.575 m), weight 107.502 kg (237 lb), SpO2 98.00%.  Intake/Output Summary (Last 24 hours) at 09/14/11 1505 Last data filed at 09/14/11 1414  Gross per 24 hour  Intake    720 ml  Output   3150 ml  Net  -2430 ml    Exam Awake Alert, Oriented *3, No new F.N deficits, Normal affect Loyal.AT,PERRAL, Positive maxillary tenderness on left Supple Neck,No JVD, No cervical lymphadenopathy appriciated.  Symmetrical Chest wall movement, Good air movement bilaterally, CTAB RRR,No Gallops,Rubs or new Murmurs, No Parasternal Heave +ve B.Sounds, Abd Soft, Non tender, No organomegaly appriciated, No rebound -guarding or rigidity. No Cyanosis, Clubbing or edema, No new Rash or bruise  Data Review   Cultures -   CBC w Diff: Lab Results  Component Value Date   WBC 9.4 09/14/2011   HGB 8.7* 09/14/2011   HCT 25.6* 09/14/2011   PLT 320 09/14/2011   LYMPHOPCT 14 01/11/2007   MONOPCT 8 01/11/2007   EOSPCT 3 01/11/2007   BASOPCT 1 01/11/2007   CMP: Lab Results  Component Value Date   NA 135 09/14/2011   K 4.6 09/14/2011   CL 104 09/14/2011   CO2 22 09/14/2011   BUN 23 09/14/2011   CREATININE  1.78* 09/14/2011   PROT 5.6* 01/01/2007   ALBUMIN 2.4* 01/01/2007   BILITOT 0.8 01/01/2007   ALKPHOS 66 01/01/2007   AST 46* 01/01/2007   ALT 62* 01/01/2007  .  Micro Results Recent Results (from the past 240 hour(s))  CSF CULTURE     Status: Normal   Collection Time   09/09/11  3:30 PM      Component Value Range Status Comment   Specimen Description CSF   Final    Special Requests TUBE 2 @2CC    Final    Gram Stain     Final    Value: CYTOSPIN WBC PRESENT, PREDOMINANTLY MONONUCLEAR     NO ORGANISMS SEEN     Performed at [facility]   Culture NO GROWTH 3 DAYS   Final    Report Status 09/13/2011 FINAL   Final   GRAM STAIN     Status: Normal   Collection Time   09/09/11  3:30 PM  Component Value Range Status Comment   Specimen Description CSF   Final    Special Requests TUBE 2@2CC    Final    Gram Stain     Final    Value: CYTOSPIN PREP     WBC PRESENT, PREDOMINANTLY MONONUCLEAR     NO ORGANISMS SEEN   Report Status 09/09/2011 FINAL   Final   CLOSTRIDIUM DIFFICILE BY PCR     Status: Normal   Collection Time   09/13/11 11:29 AM      Component Value Range Status Comment   C difficile by pcr NEGATIVE  NEGATIVE  Final   STOOL CULTURE     Status: Normal (Preliminary result)   Collection Time   09/13/11 11:29 AM      Component Value Range Status Comment   Specimen Description STOOL   Final    Special Requests NONE   Final    Culture Culture reincubated for better growth   Final    Report Status PENDING   Incomplete      Discharge Instructions      Follow-up Information    Follow up with SHAW,KIMBERLEE, MD .         Discharge Medications   Medication List  As of 09/14/2011  3:05 PM   START taking these medications         amLODipine 5 MG tablet   Commonly known as: NORVASC   Take 1 tablet (5 mg total) by mouth daily.      aspirin EC 81 MG tablet   Take 1 tablet (81 mg total) by mouth daily.      ferrous gluconate 246 (28 FE) MG tablet   Commonly known as:  FERGON   Take 1 tablet (246 mg total) by mouth daily with breakfast.      fluticasone 50 MCG/ACT nasal spray   Commonly known as: FLONASE   Place 2 sprays into the nose daily.         CONTINUE taking these medications         ALPRAZolam 0.5 MG tablet   Commonly known as: XANAX      insulin glargine 100 UNIT/ML injection   Commonly known as: LANTUS      montelukast 10 MG tablet   Commonly known as: SINGULAIR      omeprazole 20 MG capsule   Commonly known as: PRILOSEC      promethazine 25 MG tablet   Commonly known as: PHENERGAN         STOP taking these medications         losartan-hydrochlorothiazide 100-25 MG per tablet          Where to get your medications    These are the prescriptions that you need to pick up.   You may get these medications from any pharmacy.         amLODipine 5 MG tablet   aspirin EC 81 MG tablet   ferrous gluconate 246 (28 FE) MG tablet   fluticasone 50 MCG/ACT nasal spray             Total Time in preparing paper work, data evaluation and todays exam - 35 minutes  Patrice Matthew S M.D on 09/14/2011 at 3:05 PM  [facility] Office  [phone]

## 2011-09-14 NOTE — Progress Notes (Signed)
09/14/11 NSG 1650 Patient discharged to home with family, discharged instructions given and reviewed with patient.  Patient verbalized understanding, care notes given for new meds and pertinent education. Skin intact at discharge, IV discharged and intact. Patient escorted to car via wheelchair by NT.  Forbes Cellar, RN

## 2011-09-14 NOTE — Progress Notes (Signed)
Utilization Review Completed.  Shelly [name]  09/14/2011  

## 2011-09-14 NOTE — Discharge Instructions (Signed)
Will need BMP lab work to check the kidney function at your primary care office

## 2011-09-17 LAB — STOOL CULTURE

## 2011-12-22 ENCOUNTER — Ambulatory Visit
Admission: RE | Admit: 2011-12-22 | Discharge: 2011-12-22 | Disposition: A | Discharge status: Home / Self Care | Payer: BC Managed Care – PPO | Source: Ambulatory Visit | Attending: Family Medicine | Admitting: Family Medicine

## 2011-12-22 ENCOUNTER — Other Ambulatory Visit: Payer: Self-pay | Admitting: Family Medicine

## 2011-12-22 DIAGNOSIS — R109 Unspecified abdominal pain: Secondary | ICD-10-CM

## 2011-12-23 ENCOUNTER — Ambulatory Visit
Admission: RE | Admit: 2011-12-23 | Discharge: 2011-12-23 | Disposition: A | Discharge status: Home / Self Care | Payer: BC Managed Care – PPO | Source: Ambulatory Visit | Attending: Family Medicine | Admitting: Family Medicine

## 2011-12-23 ENCOUNTER — Other Ambulatory Visit: Payer: Self-pay | Admitting: Family Medicine

## 2011-12-23 DIAGNOSIS — R109 Unspecified abdominal pain: Secondary | ICD-10-CM

## 2013-04-14 ENCOUNTER — Ambulatory Visit: Payer: BC Managed Care – PPO | Admitting: *Deleted

## 2013-06-23 ENCOUNTER — Ambulatory Visit: Payer: BC Managed Care – PPO | Admitting: *Deleted

## 2013-07-28 ENCOUNTER — Other Ambulatory Visit: Payer: Self-pay | Admitting: Orthopedic Surgery

## 2013-07-28 DIAGNOSIS — M25562 Pain in left knee: Secondary | ICD-10-CM

## 2013-08-03 ENCOUNTER — Ambulatory Visit
Admission: RE | Admit: 2013-08-03 | Discharge: 2013-08-03 | Disposition: A | Discharge status: Home / Self Care | Payer: 59 | Source: Ambulatory Visit | Attending: Orthopedic Surgery | Admitting: Orthopedic Surgery

## 2013-08-03 DIAGNOSIS — M25562 Pain in left knee: Secondary | ICD-10-CM

## 2013-10-05 ENCOUNTER — Other Ambulatory Visit: Payer: Self-pay | Admitting: Orthopedic Surgery

## 2013-10-16 ENCOUNTER — Encounter (HOSPITAL_COMMUNITY): Payer: Self-pay | Admitting: Pharmacy Technician

## 2013-10-18 ENCOUNTER — Other Ambulatory Visit (HOSPITAL_COMMUNITY): Payer: Self-pay | Admitting: *Deleted

## 2013-10-18 NOTE — Patient Instructions (Addendum)
Anjelah A Frankl  10/18/2013                           YOUR PROCEDURE IS SCHEDULED ON: 10/27/13               PLEASE REPORT TO SHORT STAY CENTER AT : 10:15 AM               CALL THIS NUMBER IF ANY PROBLEMS THE DAY OF SURGERY :               [phone]                                REMEMBER:   Do not eat food or drink liquids AFTER MIDNIGHT   May have clear liquids UNTIL 6 HOURS BEFORE SURGERY (7:15 AM)               Take these medicines the morning of surgery with A SIP OF WATER: ALPRAZOLAM / HYDROCODONE   Do not wear jewelry, make-up   Do not wear lotions, powders, or perfumes.   Do not shave legs or underarms 12 hrs. before surgery (men may shave face)  Do not bring valuables to the hospital.  Contacts, dentures or bridgework may not be worn into surgery.  Leave suitcase in the car. After surgery it may be brought to your room.  For patients admitted to the hospital more than one night, checkout time is            11:00 AM                                                       The day of discharge.   Patients discharged the day of surgery will not be allowed to drive home.            If going home same day of surgery, must have someone stay with you              FIRST 24 hrs at home and arrange for some one to drive you              home from hospital.    Special Instructions             Please read over the following fact sheets that you were given:               1. [place] PREPARING FOR SURGERY SHEET               2. INCENTIVE SPIROMETER               3. MRSA INFORMATION SHEET                                                X_____________________________________________________________________        Failure to follow these instructions may result in cancellation of your surgery

## 2013-10-19 ENCOUNTER — Ambulatory Visit (HOSPITAL_COMMUNITY)
Admission: RE | Admit: 2013-10-19 | Discharge: 2013-10-19 | Disposition: A | Discharge status: Home / Self Care | Payer: 59 | Source: Ambulatory Visit | Attending: Anesthesiology | Admitting: Anesthesiology

## 2013-10-19 ENCOUNTER — Other Ambulatory Visit: Payer: Self-pay

## 2013-10-19 ENCOUNTER — Encounter (INDEPENDENT_AMBULATORY_CARE_PROVIDER_SITE_OTHER): Payer: Self-pay

## 2013-10-19 ENCOUNTER — Encounter (HOSPITAL_COMMUNITY)
Admission: RE | Admit: 2013-10-19 | Discharge: 2013-10-19 | Disposition: A | Discharge status: Home / Self Care | Payer: 59 | Source: Ambulatory Visit | Attending: Orthopedic Surgery | Admitting: Orthopedic Surgery

## 2013-10-19 ENCOUNTER — Encounter (HOSPITAL_COMMUNITY): Payer: Self-pay

## 2013-10-19 DIAGNOSIS — Z01818 Encounter for other preprocedural examination: Secondary | ICD-10-CM | POA: Insufficient documentation

## 2013-10-19 DIAGNOSIS — Z01812 Encounter for preprocedural laboratory examination: Secondary | ICD-10-CM | POA: Insufficient documentation

## 2013-10-19 DIAGNOSIS — Z01810 Encounter for preprocedural cardiovascular examination: Secondary | ICD-10-CM | POA: Insufficient documentation

## 2013-10-19 HISTORY — DX: Adverse effect of unspecified anesthetic, initial encounter: T41.45XA

## 2013-10-19 HISTORY — DX: Rash and other nonspecific skin eruption: R21

## 2013-10-19 HISTORY — DX: Stress incontinence (female) (male): N39.3

## 2013-10-19 HISTORY — DX: Unspecified osteoarthritis, unspecified site: M19.90

## 2013-10-19 HISTORY — DX: Other complications of anesthesia, initial encounter: T88.59XA

## 2013-10-19 LAB — APTT: APTT: 48 s — AB (ref 24–37)

## 2013-10-19 LAB — URINALYSIS, ROUTINE W REFLEX MICROSCOPIC
Bilirubin Urine: NEGATIVE
Glucose, UA: 500 mg/dL — AB
Hgb urine dipstick: NEGATIVE
Ketones, ur: NEGATIVE mg/dL
Leukocytes, UA: NEGATIVE
NITRITE: NEGATIVE
Protein, ur: NEGATIVE mg/dL
SPECIFIC GRAVITY, URINE: 1.008 (ref 1.005–1.030)
UROBILINOGEN UA: 0.2 mg/dL (ref 0.0–1.0)
pH: 6 (ref 5.0–8.0)

## 2013-10-19 LAB — COMPREHENSIVE METABOLIC PANEL
ALT: 11 U/L (ref 0–35)
AST: 12 U/L (ref 0–37)
Albumin: 3.2 g/dL — ABNORMAL LOW (ref 3.5–5.2)
Alkaline Phosphatase: 79 U/L (ref 39–117)
BUN: 17 mg/dL (ref 6–23)
CO2: 28 mEq/L (ref 19–32)
CREATININE: 1 mg/dL (ref 0.50–1.10)
Calcium: 9.3 mg/dL (ref 8.4–10.5)
Chloride: 100 mEq/L (ref 96–112)
GFR calc Af Amer: 71 mL/min — ABNORMAL LOW (ref >90)
GFR calc non Af Amer: 61 mL/min — ABNORMAL LOW (ref >90)
Glucose, Bld: 171 mg/dL — ABNORMAL HIGH (ref 70–99)
Potassium: 4.4 mEq/L (ref 3.7–5.3)
Sodium: 139 mEq/L (ref 137–147)
TOTAL PROTEIN: 7.2 g/dL (ref 6.0–8.3)
Total Bilirubin: 0.6 mg/dL (ref 0.3–1.2)

## 2013-10-19 LAB — PROTIME-INR
INR: 1.16 (ref 0.00–1.49)
PROTHROMBIN TIME: 14.6 s (ref 11.6–15.2)

## 2013-10-19 LAB — SURGICAL PCR SCREEN
MRSA, PCR: NEGATIVE
STAPHYLOCOCCUS AUREUS: POSITIVE — AB

## 2013-10-19 LAB — CBC
HEMATOCRIT: 31.8 % — AB (ref 36.0–46.0)
Hemoglobin: 10.5 g/dL — ABNORMAL LOW (ref 12.0–15.0)
MCH: 28.7 pg (ref 26.0–34.0)
MCHC: 33 g/dL (ref 30.0–36.0)
MCV: 86.9 fL (ref 78.0–100.0)
PLATELETS: 338 10*3/uL (ref 150–400)
RBC: 3.66 MIL/uL — ABNORMAL LOW (ref 3.87–5.11)
RDW: 13.9 % (ref 11.5–15.5)
WBC: 12.9 10*3/uL — ABNORMAL HIGH (ref 4.0–10.5)

## 2013-10-19 NOTE — Progress Notes (Signed)
Abnormal CBC / CMET / PTT / UA  Faxed to Dr. Lequita Halt

## 2013-10-26 ENCOUNTER — Other Ambulatory Visit: Payer: Self-pay | Admitting: Orthopedic Surgery

## 2013-10-26 NOTE — H&P (Signed)
Shelly [name] DOB: 05/12/1956 Married / Language: English / Race: White Female  Date of Admission:  10-27-2013  Chief Complaint:  Left Knee Pain  History of Present Illness The patient is a 57 year old female who comes in for a preoperative History and Physical. The patient is scheduled for a left unicompartmental replacement to be performed by Dr. Frank V. Aluisio, [facility] at  [facility] on 10/27/2013. Unfortunately, her left knee is getting progressively worse. She has had cortisone injections without tremendous benefit. Synvisc had helped in the past but has had less efficacy. Her pain is rather intense medially now. This is occurring at rest and with activity. She is not having swelling or mechanical symptoms. This is affecting her ability to do her job. They have been treated conservatively in the past for the above stated problem and despite conservative measures, they continue to have progressive pain and severe functional limitations and dysfunction. They have failed non-operative management including home exercise, medications, and injections. It is felt that they would benefit from undergoing unicompartmental replacement. Risks and benefits of the procedure have been discussed with the patient and they elect to proceed with surgery. There are no active contraindications to surgery such as ongoing infection or rapidly progressive neurological disease.   Problem List Primary osteoarthritis of both knees (715.16)    Allergies Dilaudid *ANALGESICS - OPIOID*. Paranoid Feelings Penicillins. Childhood RXN - Unknown Sulfa Drugs. Nausea.    Family History Diabetes Mellitus. father, grandmother mothers side and grandmother fathers side Cerebrovascular Accident. father and grandmother mothers side Chronic Obstructive Lung Disease. father and grandfather fathers side Congestive Heart Failure. father and grandmother mothers side Heart Disease. father and  grandfather fathers side Rheumatoid Arthritis. mother and grandmother mothers side Kidney disease. Father. father Hypertension. father and grandmother mothers side Osteoarthritis. father    Social History Tobacco use. Former smoker. quit 30 years ago, never smoker Children. 1 Tobacco / smoke exposure. no Current work status. 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 2-3 Previously in rehab. no Illicit drug use. no Exercise. Exercises weekly; does individual sport Drug/Alcohol Rehab (Currently). no Alcohol use. 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 live alone Advance Directives. Living Will, Healthcare POA Post-Surgical Plans. Home    Medication History Losartan Potassium (100MG Tablet, Oral) Active. HumuLIN N ( Subcutaneous) Specific dose unknown - Active. ZyrTEC Allergy (10MG Tablet, Oral) Active. Advair Diskus ( Inhalation) Specific dose unknown - Active. Albuterol Sulfate ( Inhalation) Specific dose unknown - Active. Crestor ( Oral) Specific dose unknown - Active. Omeprazole ( Oral) Specific dose unknown - Active.   Past Surgical History Tonsillectomy Hysterectomy. complete (non-cancerous) Gallbladder Surgery. laporoscopic Appendectomy Colectomy. partial    Past Medical History Autoimmune disorder. mother Anxiety Disorder Irritable bowel syndrome High blood pressure Tuberculosis Asthma Migraine Headache Hepatitis. diagnosed during ruptured appendix hospitalization Endometriosis    Review of Systems General:Not Present- Chills, Fever, Night Sweats, Fatigue, Weight Gain, Weight Loss and Memory Loss. Skin:Not Present- Hives, Itching, Rash, Eczema and Lesions. HEENT:Not Present- Tinnitus, Headache, Double Vision, Visual Loss, Hearing Loss and Dentures. Respiratory:Not Present- Shortness  of breath with exertion, Shortness of breath at rest, Allergies, Coughing up blood and Chronic Cough. Cardiovascular:Not Present- Chest Pain, Racing/skipping heartbeats, Difficulty Breathing Lying Down, Murmur, Swelling and Palpitations. Gastrointestinal:Not Present- Bloody Stool, Heartburn, Abdominal Pain, Vomiting, Nausea, Constipation, Diarrhea, Difficulty Swallowing, Jaundice and Loss of appetitie. Female Genitourinary:Not Present- Blood in Urine, Urinary frequency,   Weak urinary stream, Discharge, Flank Pain, Incontinence, Painful Urination, Urgency, Urinary Retention and Urinating at Night. Musculoskeletal:Not Present- Muscle Weakness, Muscle Pain, Joint Swelling, Joint Pain, Back Pain, Morning Stiffness and Spasms. Neurological:Not Present- Tremor, Dizziness, Blackout spells, Paralysis, Difficulty with balance and Weakness. Psychiatric:Not Present- Insomnia.    Vitals Weight: 228 lb Height: 62 in Weight was reported by patient. Height was reported by patient. Body Surface Area: 2.13 m Body Mass Index: 41.7 kg/m Pulse: 80 (Regular) Resp.: 16 (Unlabored) BP: 162/88 (Sitting, Right Arm, Standard)     Physical Exam The physical exam findings are as follows:   General Mental Status - Alert, cooperative and good historian. General Appearance- pleasant. Not in acute distress. Orientation- Oriented X3. Build & Nutrition- Overweight, Well nourished and Well developed.   Head and Neck Head- normocephalic, atraumatic . Neck Global Assessment- supple. no bruit auscultated on the right and no bruit auscultated on the left.   Eye Pupil- Bilateral- Regular and Round. Motion- Bilateral- EOMI.   Chest and Lung Exam Auscultation: Breath sounds:- clear at anterior chest wall and - clear at posterior chest wall. Adventitious sounds:- No Adventitious sounds.   Cardiovascular Auscultation:Rhythm- Regular rate and rhythm. Heart Sounds- S1  WNL and S2 WNL. Murmurs & Other Heart Sounds:Auscultation of the heart reveals - No Murmurs.   Abdomen Inspection:Contour- Generalized moderate distention. Palpation/Percussion:Tenderness- Abdomen is non-tender to palpation. Rigidity (guarding)- Abdomen is soft. Auscultation:Auscultation of the abdomen reveals - Bowel sounds normal.   Female Genitourinary   Note: Not done, not pertinent to present illness  Musculoskeletal   Note: She is alert and oriented. No apparent distress.   The left knee shows no effusion. She has a slight varus deformity. Range of motion is about 5-125. She has tenderness medial greater than lateral. No instability is noted. Pulse, sensation and motor are intact.  The right knee shows no effusion. Range of motion is 0-135. Slight crepitus on range of motion. Tenderness noted on the medial joint line.  Previous RADIOGRAPHS: AP both knees and lateral show she has bone on bone arthritis in the medial compartment of the left knee. She does not have any involvement of the lateral or patellofemoral compartments.   Assessment & Plan Primary osteoarthritis of both knees (715.16) Impression: Right Knee and Left Knee  Note: Plan is for a Left Unicompatmental Replacement by Dr. Lequita Halt.  Plan is to go home.  PCP - Dr. Cam Hai - Patient has been seen preoperatively and felt to be stable for surgery.  The patient does not have any contraindications and will receive TXA (tranexamic acid) prior to surgery.  Signed electronically by Lauraine Rinne, III PA-C

## 2013-10-27 ENCOUNTER — Encounter (HOSPITAL_COMMUNITY): Payer: 59 | Admitting: Anesthesiology

## 2013-10-27 ENCOUNTER — Inpatient Hospital Stay (HOSPITAL_COMMUNITY)
Admission: RE | Admit: 2013-10-27 | Discharge: 2013-10-28 | DRG: 470 | Disposition: A | Discharge status: Home Health | Payer: 59 | Source: Ambulatory Visit | Attending: Orthopedic Surgery | Admitting: Orthopedic Surgery

## 2013-10-27 ENCOUNTER — Inpatient Hospital Stay (HOSPITAL_COMMUNITY): Payer: 59 | Admitting: Anesthesiology

## 2013-10-27 ENCOUNTER — Encounter (HOSPITAL_COMMUNITY)
Admission: RE | Disposition: A | Discharge status: Home Health | Payer: Self-pay | Source: Ambulatory Visit | Attending: Orthopedic Surgery

## 2013-10-27 ENCOUNTER — Encounter (HOSPITAL_COMMUNITY): Payer: Self-pay | Admitting: *Deleted

## 2013-10-27 DIAGNOSIS — Z8742 Personal history of other diseases of the female genital tract: Secondary | ICD-10-CM

## 2013-10-27 DIAGNOSIS — Z87891 Personal history of nicotine dependence: Secondary | ICD-10-CM

## 2013-10-27 DIAGNOSIS — E785 Hyperlipidemia, unspecified: Secondary | ICD-10-CM | POA: Diagnosis present

## 2013-10-27 DIAGNOSIS — M179 Osteoarthritis of knee, unspecified: Secondary | ICD-10-CM | POA: Diagnosis present

## 2013-10-27 DIAGNOSIS — Z880 Allergy status to penicillin: Secondary | ICD-10-CM

## 2013-10-27 DIAGNOSIS — Z8249 Family history of ischemic heart disease and other diseases of the circulatory system: Secondary | ICD-10-CM

## 2013-10-27 DIAGNOSIS — I10 Essential (primary) hypertension: Secondary | ICD-10-CM | POA: Diagnosis present

## 2013-10-27 DIAGNOSIS — Z833 Family history of diabetes mellitus: Secondary | ICD-10-CM

## 2013-10-27 DIAGNOSIS — Z8619 Personal history of other infectious and parasitic diseases: Secondary | ICD-10-CM

## 2013-10-27 DIAGNOSIS — Z823 Family history of stroke: Secondary | ICD-10-CM

## 2013-10-27 DIAGNOSIS — J45909 Unspecified asthma, uncomplicated: Secondary | ICD-10-CM | POA: Diagnosis present

## 2013-10-27 DIAGNOSIS — Z8611 Personal history of tuberculosis: Secondary | ICD-10-CM

## 2013-10-27 DIAGNOSIS — Z882 Allergy status to sulfonamides status: Secondary | ICD-10-CM

## 2013-10-27 DIAGNOSIS — Z8261 Family history of arthritis: Secondary | ICD-10-CM

## 2013-10-27 DIAGNOSIS — E669 Obesity, unspecified: Secondary | ICD-10-CM | POA: Diagnosis present

## 2013-10-27 DIAGNOSIS — Z79899 Other long term (current) drug therapy: Secondary | ICD-10-CM

## 2013-10-27 DIAGNOSIS — E119 Type 2 diabetes mellitus without complications: Secondary | ICD-10-CM | POA: Diagnosis present

## 2013-10-27 DIAGNOSIS — K219 Gastro-esophageal reflux disease without esophagitis: Secondary | ICD-10-CM | POA: Diagnosis present

## 2013-10-27 DIAGNOSIS — F411 Generalized anxiety disorder: Secondary | ICD-10-CM | POA: Diagnosis present

## 2013-10-27 DIAGNOSIS — M1710 Unilateral primary osteoarthritis, unspecified knee: Principal | ICD-10-CM | POA: Diagnosis present

## 2013-10-27 HISTORY — PX: PARTIAL KNEE ARTHROPLASTY: SHX2174

## 2013-10-27 LAB — TYPE AND SCREEN
ABO/RH(D): O NEG
ANTIBODY SCREEN: NEGATIVE

## 2013-10-27 LAB — GLUCOSE, CAPILLARY
GLUCOSE-CAPILLARY: 179 mg/dL — AB (ref 70–99)
Glucose-Capillary: 119 mg/dL — ABNORMAL HIGH (ref 70–99)
Glucose-Capillary: 224 mg/dL — ABNORMAL HIGH (ref 70–99)
Glucose-Capillary: 334 mg/dL — ABNORMAL HIGH (ref 70–99)

## 2013-10-27 LAB — ABO/RH: ABO/RH(D): O NEG

## 2013-10-27 SURGERY — ARTHROPLASTY, KNEE, UNICOMPARTMENTAL
Anesthesia: Spinal | Site: Knee | Laterality: Left

## 2013-10-27 MED ORDER — BUPIVACAINE HCL 0.25 % IJ SOLN
INTRAMUSCULAR | Status: DC | PRN
  Administered 2013-10-27: 30 mL

## 2013-10-27 MED ORDER — PHENOL 1.4 % MT LIQD: 1.0000 | OROMUCOSAL | Status: DC | PRN

## 2013-10-27 MED ORDER — DEXAMETHASONE SODIUM PHOSPHATE 10 MG/ML IJ SOLN
10.0000 mg | Freq: Once | INTRAMUSCULAR | Status: AC
  Administered 2013-10-27: 10 mg via INTRAVENOUS

## 2013-10-27 MED ORDER — METOCLOPRAMIDE HCL 5 MG/ML IJ SOLN: 5.0000 mg | Freq: Three times a day (TID) | INTRAMUSCULAR | Status: DC | PRN

## 2013-10-27 MED ORDER — PROPOFOL 10 MG/ML IV BOLUS
INTRAVENOUS | Status: AC
  Filled 2013-10-27: qty 20

## 2013-10-27 MED ORDER — PROMETHAZINE HCL 25 MG/ML IJ SOLN: 6.2500 mg | INTRAMUSCULAR | Status: DC | PRN

## 2013-10-27 MED ORDER — DIPHENHYDRAMINE HCL 12.5 MG/5ML PO ELIX: 12.5000 mg | ORAL_SOLUTION | ORAL | Status: DC | PRN

## 2013-10-27 MED ORDER — LACTATED RINGERS IV SOLN: INTRAVENOUS | Status: DC

## 2013-10-27 MED ORDER — PROPOFOL INFUSION 10 MG/ML OPTIME
INTRAVENOUS | Status: DC | PRN
  Administered 2013-10-27: 140 ug/kg/min via INTRAVENOUS

## 2013-10-27 MED ORDER — METHOCARBAMOL 500 MG PO TABS
500.0000 mg | ORAL_TABLET | Freq: Four times a day (QID) | ORAL | Status: DC | PRN
  Administered 2013-10-27: 500 mg via ORAL
  Filled 2013-10-27: qty 1

## 2013-10-27 MED ORDER — MIDAZOLAM HCL 2 MG/2ML IJ SOLN
INTRAMUSCULAR | Status: AC
  Filled 2013-10-27: qty 2

## 2013-10-27 MED ORDER — LORATADINE 10 MG PO TABS
10.0000 mg | ORAL_TABLET | Freq: Every day | ORAL | Status: DC
  Administered 2013-10-27: 10 mg via ORAL
  Filled 2013-10-27 (×2): qty 1

## 2013-10-27 MED ORDER — ONDANSETRON HCL 4 MG/2ML IJ SOLN
INTRAMUSCULAR | Status: AC
  Filled 2013-10-27: qty 2

## 2013-10-27 MED ORDER — LOSARTAN POTASSIUM 50 MG PO TABS
100.0000 mg | ORAL_TABLET | Freq: Every morning | ORAL | Status: DC
  Administered 2013-10-28: 100 mg via ORAL
  Filled 2013-10-27: qty 2

## 2013-10-27 MED ORDER — TRANEXAMIC ACID 100 MG/ML IV SOLN
1000.0000 mg | INTRAVENOUS | Status: AC
  Administered 2013-10-27: 1000 mg via INTRAVENOUS
  Filled 2013-10-27: qty 10

## 2013-10-27 MED ORDER — METHOCARBAMOL 100 MG/ML IJ SOLN
750.0000 mg | Freq: Four times a day (QID) | INTRAVENOUS | Status: DC | PRN
  Filled 2013-10-27: qty 7.5

## 2013-10-27 MED ORDER — DEXAMETHASONE SODIUM PHOSPHATE 10 MG/ML IJ SOLN
INTRAMUSCULAR | Status: AC
  Filled 2013-10-27: qty 1

## 2013-10-27 MED ORDER — ACETAMINOPHEN 650 MG RE SUPP: 650.0000 mg | Freq: Four times a day (QID) | RECTAL | Status: DC | PRN

## 2013-10-27 MED ORDER — BUPIVACAINE LIPOSOME 1.3 % IJ SUSP
INTRAMUSCULAR | Status: DC | PRN
  Administered 2013-10-27: 20 mL

## 2013-10-27 MED ORDER — METHOCARBAMOL 500 MG PO TABS: 500.0000 mg | ORAL_TABLET | Freq: Four times a day (QID) | ORAL | Status: DC | PRN

## 2013-10-27 MED ORDER — ALBUTEROL SULFATE (2.5 MG/3ML) 0.083% IN NEBU: 3.0000 mL | INHALATION_SOLUTION | Freq: Four times a day (QID) | RESPIRATORY_TRACT | Status: DC | PRN

## 2013-10-27 MED ORDER — SODIUM CHLORIDE 0.9 % IV SOLN: INTRAVENOUS | Status: DC

## 2013-10-27 MED ORDER — LACTATED RINGERS IV SOLN
INTRAVENOUS | Status: DC
  Administered 2013-10-27: 14:00:00 via INTRAVENOUS
  Administered 2013-10-27: 1000 mL via INTRAVENOUS
  Administered 2013-10-27: 13:00:00 via INTRAVENOUS

## 2013-10-27 MED ORDER — EPHEDRINE SULFATE 50 MG/ML IJ SOLN
INTRAMUSCULAR | Status: DC | PRN
  Administered 2013-10-27: 5 mg via INTRAVENOUS

## 2013-10-27 MED ORDER — ACETAMINOPHEN 10 MG/ML IV SOLN
1000.0000 mg | Freq: Once | INTRAVENOUS | Status: AC
  Administered 2013-10-27: 1000 mg via INTRAVENOUS
  Filled 2013-10-27: qty 100

## 2013-10-27 MED ORDER — FENTANYL CITRATE 0.05 MG/ML IJ SOLN
INTRAMUSCULAR | Status: DC | PRN
  Administered 2013-10-27: 100 ug via INTRAVENOUS

## 2013-10-27 MED ORDER — BUPIVACAINE HCL (PF) 0.25 % IJ SOLN
INTRAMUSCULAR | Status: AC
  Filled 2013-10-27: qty 30

## 2013-10-27 MED ORDER — SODIUM CHLORIDE 0.9 % IJ SOLN
INTRAMUSCULAR | Status: DC | PRN
  Administered 2013-10-27: 30 mL

## 2013-10-27 MED ORDER — OMEPRAZOLE MAGNESIUM 20 MG PO TBEC: 20.0000 mg | DELAYED_RELEASE_TABLET | Freq: Every day | ORAL | Status: DC

## 2013-10-27 MED ORDER — FENTANYL CITRATE 0.05 MG/ML IJ SOLN: 25.0000 ug | INTRAMUSCULAR | Status: DC | PRN

## 2013-10-27 MED ORDER — FENTANYL CITRATE 0.05 MG/ML IJ SOLN
INTRAMUSCULAR | Status: AC
  Filled 2013-10-27: qty 2

## 2013-10-27 MED ORDER — TRAMADOL HCL 50 MG PO TABS
50.0000 mg | ORAL_TABLET | Freq: Four times a day (QID) | ORAL | Status: DC | PRN
  Administered 2013-10-27: 100 mg via ORAL
  Filled 2013-10-27: qty 2

## 2013-10-27 MED ORDER — SODIUM CHLORIDE 0.9 % IV SOLN
INTRAVENOUS | Status: DC
  Administered 2013-10-27: 75 mL/h via INTRAVENOUS
  Administered 2013-10-28: 07:00:00 via INTRAVENOUS

## 2013-10-27 MED ORDER — BUPIVACAINE LIPOSOME 1.3 % IJ SUSP
20.0000 mL | Freq: Once | INTRAMUSCULAR | Status: DC
  Filled 2013-10-27: qty 20

## 2013-10-27 MED ORDER — ONDANSETRON HCL 4 MG/2ML IJ SOLN: 4.0000 mg | Freq: Four times a day (QID) | INTRAMUSCULAR | Status: DC | PRN

## 2013-10-27 MED ORDER — POLYETHYLENE GLYCOL 3350 17 G PO PACK: 17.0000 g | PACK | Freq: Every day | ORAL | Status: DC | PRN

## 2013-10-27 MED ORDER — CEFAZOLIN SODIUM-DEXTROSE 2-3 GM-% IV SOLR
INTRAVENOUS | Status: AC
  Filled 2013-10-27: qty 50

## 2013-10-27 MED ORDER — SODIUM CHLORIDE 0.9 % IJ SOLN
INTRAMUSCULAR | Status: AC
  Filled 2013-10-27: qty 50

## 2013-10-27 MED ORDER — METHOCARBAMOL 500 MG PO TABS
750.0000 mg | ORAL_TABLET | Freq: Four times a day (QID) | ORAL | Status: DC | PRN
  Administered 2013-10-27: 250 mg via ORAL
  Administered 2013-10-28 (×2): 750 mg via ORAL
  Filled 2013-10-27 (×3): qty 2

## 2013-10-27 MED ORDER — MEPERIDINE HCL 50 MG/ML IJ SOLN: 6.2500 mg | INTRAMUSCULAR | Status: DC | PRN

## 2013-10-27 MED ORDER — BISACODYL 10 MG RE SUPP: 10.0000 mg | Freq: Every day | RECTAL | Status: DC | PRN

## 2013-10-27 MED ORDER — CEFAZOLIN SODIUM-DEXTROSE 2-3 GM-% IV SOLR
2.0000 g | INTRAVENOUS | Status: AC
  Administered 2013-10-27: 2 g via INTRAVENOUS

## 2013-10-27 MED ORDER — ONDANSETRON HCL 4 MG/2ML IJ SOLN
INTRAMUSCULAR | Status: DC | PRN
  Administered 2013-10-27: 4 mg via INTRAVENOUS

## 2013-10-27 MED ORDER — ONDANSETRON HCL 4 MG PO TABS: 4.0000 mg | ORAL_TABLET | Freq: Four times a day (QID) | ORAL | Status: DC | PRN

## 2013-10-27 MED ORDER — INSULIN ASPART 100 UNIT/ML ~~LOC~~ SOLN
0.0000 [IU] | Freq: Three times a day (TID) | SUBCUTANEOUS | Status: DC
  Administered 2013-10-27 – 2013-10-28 (×2): 5 [IU] via SUBCUTANEOUS
  Administered 2013-10-28: 8 [IU] via SUBCUTANEOUS

## 2013-10-27 MED ORDER — METOCLOPRAMIDE HCL 10 MG PO TABS: 5.0000 mg | ORAL_TABLET | Freq: Three times a day (TID) | ORAL | Status: DC | PRN

## 2013-10-27 MED ORDER — PANTOPRAZOLE SODIUM 40 MG PO TBEC
40.0000 mg | DELAYED_RELEASE_TABLET | Freq: Every day | ORAL | Status: DC
  Administered 2013-10-27: 40 mg via ORAL
  Filled 2013-10-27 (×2): qty 1

## 2013-10-27 MED ORDER — BUPIVACAINE IN DEXTROSE 0.75-8.25 % IT SOLN
INTRATHECAL | Status: DC | PRN
  Administered 2013-10-27: 1.6 mL via INTRATHECAL

## 2013-10-27 MED ORDER — DOCUSATE SODIUM 100 MG PO CAPS
100.0000 mg | ORAL_CAPSULE | Freq: Two times a day (BID) | ORAL | Status: DC
  Administered 2013-10-27 – 2013-10-28 (×2): 100 mg via ORAL

## 2013-10-27 MED ORDER — CEFAZOLIN SODIUM-DEXTROSE 2-3 GM-% IV SOLR
2.0000 g | Freq: Four times a day (QID) | INTRAVENOUS | Status: AC
  Administered 2013-10-27 – 2013-10-28 (×2): 2 g via INTRAVENOUS
  Filled 2013-10-27 (×2): qty 50

## 2013-10-27 MED ORDER — FLEET ENEMA 7-19 GM/118ML RE ENEM: 1.0000 | ENEMA | Freq: Once | RECTAL | Status: AC | PRN

## 2013-10-27 MED ORDER — ACETAMINOPHEN 500 MG PO TABS
1000.0000 mg | ORAL_TABLET | Freq: Four times a day (QID) | ORAL | Status: AC
  Administered 2013-10-27 – 2013-10-28 (×4): 1000 mg via ORAL
  Filled 2013-10-27 (×5): qty 2

## 2013-10-27 MED ORDER — MENTHOL 3 MG MT LOZG: 1.0000 | LOZENGE | OROMUCOSAL | Status: DC | PRN

## 2013-10-27 MED ORDER — MONTELUKAST SODIUM 10 MG PO TABS
10.0000 mg | ORAL_TABLET | Freq: Every day | ORAL | Status: DC
  Administered 2013-10-27: 10 mg via ORAL
  Filled 2013-10-27 (×2): qty 1

## 2013-10-27 MED ORDER — OXYCODONE HCL 5 MG PO TABS
5.0000 mg | ORAL_TABLET | ORAL | Status: DC | PRN
  Administered 2013-10-27: 5 mg via ORAL
  Administered 2013-10-27: 10 mg via ORAL
  Administered 2013-10-27: 5 mg via ORAL
  Administered 2013-10-28 (×4): 10 mg via ORAL
  Filled 2013-10-27 (×2): qty 2
  Filled 2013-10-27: qty 1
  Filled 2013-10-27 (×2): qty 2
  Filled 2013-10-27: qty 1
  Filled 2013-10-27: qty 2

## 2013-10-27 MED ORDER — CHLORHEXIDINE GLUCONATE 4 % EX LIQD: 60.0000 mL | Freq: Once | CUTANEOUS | Status: DC

## 2013-10-27 MED ORDER — ACETAMINOPHEN 325 MG PO TABS: 650.0000 mg | ORAL_TABLET | Freq: Four times a day (QID) | ORAL | Status: DC | PRN

## 2013-10-27 MED ORDER — MIDAZOLAM HCL 5 MG/5ML IJ SOLN
INTRAMUSCULAR | Status: DC | PRN
  Administered 2013-10-27: 2 mg via INTRAVENOUS

## 2013-10-27 MED ORDER — MORPHINE SULFATE 2 MG/ML IJ SOLN
1.0000 mg | INTRAMUSCULAR | Status: DC | PRN
  Administered 2013-10-27: 2 mg via INTRAVENOUS
  Administered 2013-10-27: 1 mg via INTRAVENOUS
  Administered 2013-10-27 – 2013-10-28 (×6): 2 mg via INTRAVENOUS
  Filled 2013-10-27 (×8): qty 1

## 2013-10-27 MED ORDER — METHOCARBAMOL 100 MG/ML IJ SOLN
500.0000 mg | Freq: Four times a day (QID) | INTRAVENOUS | Status: DC | PRN
  Administered 2013-10-27: 500 mg via INTRAVENOUS
  Filled 2013-10-27: qty 5

## 2013-10-27 MED ORDER — ALPRAZOLAM 0.5 MG PO TABS
0.5000 mg | ORAL_TABLET | Freq: Three times a day (TID) | ORAL | Status: DC | PRN
  Administered 2013-10-27 – 2013-10-28 (×2): 0.5 mg via ORAL
  Filled 2013-10-27 (×2): qty 1

## 2013-10-27 MED ORDER — RIVAROXABAN 10 MG PO TABS
10.0000 mg | ORAL_TABLET | Freq: Every day | ORAL | Status: DC
  Administered 2013-10-28: 10 mg via ORAL
  Filled 2013-10-27 (×2): qty 1

## 2013-10-27 SURGICAL SUPPLY — 52 items
BAG ZIPLOCK 12X15 (MISCELLANEOUS) ×2 IMPLANT
BANDAGE ELASTIC 6 VELCRO ST LF (GAUZE/BANDAGES/DRESSINGS) ×2 IMPLANT
BANDAGE ESMARK 6X9 LF (GAUZE/BANDAGES/DRESSINGS) ×1 IMPLANT
BLADE SAW RECIPROCATING 77.5 (BLADE) ×2 IMPLANT
BLADE SAW SGTL 13.0X1.19X90.0M (BLADE) ×2 IMPLANT
BNDG ESMARK 6X9 LF (GAUZE/BANDAGES/DRESSINGS) ×2
BOWL SMART MIX CTS (DISPOSABLE) ×2 IMPLANT
BUR OVAL CARBIDE 4.0 (BURR) ×2 IMPLANT
CAP KNEE UNI LEFT MEDIAL ×2 IMPLANT
CEMENT HV SMART SET (Cement) ×2 IMPLANT
CUFF TOURN SGL QUICK 34 (TOURNIQUET CUFF) ×1
CUFF TRNQT CYL 34X4X40X1 (TOURNIQUET CUFF) ×1 IMPLANT
DEPRESSOR TONGUE BLADE STERILE (MISCELLANEOUS) ×2 IMPLANT
DRAPE EXTREMITY T 121X128X90 (DRAPE) ×2 IMPLANT
DRAPE POUCH INSTRU U-SHP 10X18 (DRAPES) ×2 IMPLANT
DRSG ADAPTIC 3X8 NADH LF (GAUZE/BANDAGES/DRESSINGS) ×2 IMPLANT
DRSG PAD ABDOMINAL 8X10 ST (GAUZE/BANDAGES/DRESSINGS) ×4 IMPLANT
DURAPREP 26ML APPLICATOR (WOUND CARE) ×2 IMPLANT
ELECT REM PT RETURN 9FT ADLT (ELECTROSURGICAL) ×2
ELECTRODE REM PT RTRN 9FT ADLT (ELECTROSURGICAL) ×1 IMPLANT
EVACUATOR 1/8 PVC DRAIN (DRAIN) ×2 IMPLANT
FACESHIELD WRAPAROUND (MASK) ×10 IMPLANT
GLOVE BIO SURGEON STRL SZ7.5 (GLOVE) ×2 IMPLANT
GLOVE BIO SURGEON STRL SZ8 (GLOVE) ×2 IMPLANT
GLOVE BIOGEL PI IND STRL 8 (GLOVE) ×3 IMPLANT
GLOVE BIOGEL PI INDICATOR 8 (GLOVE) ×3
GOWN STRL REUS W/TWL LRG LVL3 (GOWN DISPOSABLE) ×2 IMPLANT
GOWN STRL REUS W/TWL XL LVL3 (GOWN DISPOSABLE) ×6 IMPLANT
HANDPIECE INTERPULSE COAX TIP (DISPOSABLE) ×1
IMMOBILIZER KNEE 20 (SOFTGOODS) ×2 IMPLANT
KIT BASIN OR (CUSTOM PROCEDURE TRAY) ×2 IMPLANT
MANIFOLD NEPTUNE II (INSTRUMENTS) ×2 IMPLANT
NDL SAFETY ECLIPSE 18X1.5 (NEEDLE) ×2 IMPLANT
NEEDLE HYPO 18GX1.5 SHARP (NEEDLE) ×2
PACK GENERAL/GYN (CUSTOM PROCEDURE TRAY) ×2 IMPLANT
PACK TOTAL JOINT (CUSTOM PROCEDURE TRAY) ×2 IMPLANT
PAD ABD 8X10 STRL (GAUZE/BANDAGES/DRESSINGS) ×2 IMPLANT
PADDING CAST COTTON 6X4 STRL (CAST SUPPLIES) ×2 IMPLANT
POSITIONER SURGICAL ARM (MISCELLANEOUS) ×2 IMPLANT
SET HNDPC FAN SPRY TIP SCT (DISPOSABLE) ×1 IMPLANT
SPONGE GAUZE 4X4 12PLY (GAUZE/BANDAGES/DRESSINGS) ×2 IMPLANT
STRIP CLOSURE SKIN 1/2X4 (GAUZE/BANDAGES/DRESSINGS) ×2 IMPLANT
SUCTION FRAZIER 12FR DISP (SUCTIONS) ×2 IMPLANT
SUT MNCRL AB 4-0 PS2 18 (SUTURE) ×2 IMPLANT
SUT PDS AB 1 CT1 27 (SUTURE) ×2 IMPLANT
SUT VIC AB 2-0 CT1 27 (SUTURE) ×3
SUT VIC AB 2-0 CT1 TAPERPNT 27 (SUTURE) ×3 IMPLANT
SUT VLOC 180 0 24IN GS25 (SUTURE) ×2 IMPLANT
SYR 20CC LL (SYRINGE) ×2 IMPLANT
SYR 50ML LL SCALE MARK (SYRINGE) ×2 IMPLANT
TOWEL OR 17X26 10 PK STRL BLUE (TOWEL DISPOSABLE) ×4 IMPLANT
TRAY FOLEY CATH 14FRSI W/METER (CATHETERS) ×2 IMPLANT

## 2013-10-27 NOTE — Transfer of Care (Signed)
Immediate Anesthesia Transfer of Care Note  Patient: Shelly [name]  Procedure(s) Performed: Procedure(s): LEFT KNEE UNICOMPARTMENTAL REPLACEMENT  (Left)  Patient Location: PACU  Anesthesia Type:Spinal  Level of Consciousness: awake, alert , oriented and patient cooperative  Airway & Oxygen Therapy: Patient Spontanous Breathing and Patient connected to face mask oxygen  Post-op Assessment: Report given to PACU RN and Post -op Vital signs reviewed and stable  Post vital signs: Reviewed and stable  Complications: No apparent anesthesia complications

## 2013-10-27 NOTE — Anesthesia Postprocedure Evaluation (Signed)
  Anesthesia Post-op Note  Patient: Shelly [name]  Procedure(s) Performed: Procedure(s) (LRB): LEFT KNEE UNICOMPARTMENTAL REPLACEMENT  (Left)  Patient Location: PACU  Anesthesia Type: Spinal  Level of Consciousness: awake and alert   Airway and Oxygen Therapy: Patient Spontanous Breathing  Post-op Pain: mild  Post-op Assessment: Post-op Vital signs reviewed, Patient's Cardiovascular Status Stable, Respiratory Function Stable, Patent Airway and No signs of Nausea or vomiting  Last Vitals:  Filed Vitals:   10/27/13 1445  BP: 133/55  Pulse: 71  Temp:   Resp: 14    Post-op Vital Signs: stable   Complications: No apparent anesthesia complications

## 2013-10-27 NOTE — Brief Op Note (Signed)
10/27/2013  2:03 PM  PATIENT:  Shelly [name]  58 y.o. female  PRE-OPERATIVE DIAGNOSIS:  LEFT KNEE MEDIAL COMPARTMENT  OA  POST-OPERATIVE DIAGNOSIS:  LEFT KNEE MEDIAL COMPARTMENT  OA  PROCEDURE:  Procedure(s): LEFT KNEE UNICOMPARTMENTAL REPLACEMENT  (Left)  SURGEON:  Surgeon(s) and Role:    * Loanne Drilling, MD - Primary  PHYSICIAN ASSISTANT:   ASSISTANTS: Avel Peace, PA-C   ANESTHESIA:   spinal  EBL:  Total I/O In: 1000 [I.V.:1000] Out: 150 [Urine:150]  BLOOD ADMINISTERED:none  DRAINS: (Medium) Hemovact drain(s) in the left knee with  Suction Open   LOCAL MEDICATIONS USED:  OTHER Exparel  COUNTS:  YES  TOURNIQUET:   Total Tourniquet Time Documented: Thigh (Left) - 41 minutes Total: Thigh (Left) - 41 minutes   DICTATION: .Other Dictation: Dictation Number [id number]  PLAN OF CARE: Admit to inpatient   PATIENT DISPOSITION:  PACU - hemodynamically stable.

## 2013-10-27 NOTE — H&P (View-Only) (Signed)
Shelly [name] DOB: 10-Dec-1955 Married / Language: English / Race: White Female  Date of Admission:  10-27-2013  Chief Complaint:  Left Knee Pain  History of Present Illness The patient is a 58 year old female who comes in for a preoperative History and Physical. The patient is scheduled for a left unicompartmental replacement to be performed by Dr. Gus RankinFrank V. Aluisio[facility] 10/27/2013. Unfortunately, her left knee is getting progressively worse. She has had cortisone injections without tremendous benefit. Synvisc had helped in the past but has had less efficacy. Her pain is rather intense medially now. This is occurring at rest and with activity. She is not having swelling or mechanical symptoms. This is affecting her ability to do her job. They have been treated conservatively in the past for the above stated problem and despite conservative measures, they continue to have progressive pain and severe functional limitations and dysfunction. They have failed non-operative management including home exercise, medications, and injections. It is felt that they would benefit from undergoing unicompartmental replacement. Risks and benefits of the procedure have been discussed with the patient and they elect to proceed with surgery. There are no active contraindications to surgery such as ongoing infection or rapidly progressive neurological disease.   Problem List Primary osteoarthritis of both knees (715.16)    Allergies Dilaudid *ANALGESICS - OPIOID*. Paranoid Feelings Penicillins. Childhood RXN - Unknown Sulfa Drugs. Nausea.    Family History Diabetes Mellitus. father, grandmother mothers side and grandmother fathers side Cerebrovascular Accident. father and grandmother mothers side Chronic Obstructive Lung Disease. father and grandfather fathers side Congestive Heart Failure. father and grandmother mothers side Heart Disease. father and  grandfather fathers side Rheumatoid Arthritis. mother and grandmother mothers side Kidney disease. Father. father Hypertension. father and grandmother mothers side Osteoarthritis. father    Social History Tobacco use. Former smoker. quit 30 years ago, never smoker Children. 1 Tobacco / smoke exposure. 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 2-3 Previously in rehab. no Illicit drug use. no Exercise. Exercises weekly; does individual sport Drug/Alcohol Rehab (Currently). no Alcohol use. 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 live alone Advance Directives. Living Will, Healthcare POA Post-Surgical Plans. Home    Medication History Losartan Potassium (100MG  Tablet, Oral) Active. HumuLIN N ( Subcutaneous) Specific dose unknown - Active. ZyrTEC Allergy (10MG  Tablet, Oral) Active. Advair Diskus ( Inhalation) Specific dose unknown - Active. Albuterol Sulfate ( Inhalation) Specific dose unknown - Active. Crestor ( Oral) Specific dose unknown - Active. Omeprazole ( Oral) Specific dose unknown - Active.   Past Surgical History Tonsillectomy Hysterectomy. complete (non-cancerous) Gallbladder Surgery. laporoscopic Appendectomy Colectomy. partial    Past Medical History Autoimmune disorder. mother Anxiety Disorder Irritable bowel syndrome High blood pressure Tuberculosis Asthma Migraine Headache Hepatitis. diagnosed during ruptured appendix hospitalization Endometriosis    Review of Systems General:Not Present- Chills, Fever, Night Sweats, Fatigue, Weight Gain, Weight Loss and Memory Loss. Skin:Not Present- Hives, Itching, Rash, Eczema and Lesions. HEENT:Not Present- Tinnitus, Headache, Double Vision, Visual Loss, Hearing Loss and Dentures. Respiratory:Not Present- Shortness  of breath with exertion, Shortness of breath at rest, Allergies, Coughing up blood and Chronic Cough. Cardiovascular:Not Present- Chest Pain, Racing/skipping heartbeats, Difficulty Breathing Lying Down, Murmur, Swelling and Palpitations. Gastrointestinal:Not Present- Bloody Stool, Heartburn, Abdominal Pain, Vomiting, Nausea, Constipation, Diarrhea, Difficulty Swallowing, Jaundice and Loss of appetitie. Female Genitourinary:Not Present- Blood in Urine, Urinary frequency,  Weak urinary stream, Discharge, Flank Pain, Incontinence, Painful Urination, Urgency, Urinary Retention and Urinating at Night. Musculoskeletal:Not Present- Muscle Weakness, Muscle Pain, Joint Swelling, Joint Pain, Back Pain, Morning Stiffness and Spasms. Neurological:Not Present- Tremor, Dizziness, Blackout spells, Paralysis, Difficulty with balance and Weakness. Psychiatric:Not Present- Insomnia.    Vitals Weight: 228 lb Height: 62 in Weight was reported by patient. Height was reported by patient. Body Surface Area: 2.13 m Body Mass Index: 41.7 kg/m Pulse: 80 (Regular) Resp.: 16 (Unlabored) BP: 162/88 (Sitting, Right Arm, Standard)     Physical Exam The physical exam findings are as follows:   General Mental Status - Alert, cooperative and good historian. General Appearance- pleasant. Not in acute distress. Orientation- Oriented X3. Build & Nutrition- Overweight, Well nourished and Well developed.   Head and Neck Head- normocephalic, atraumatic . Neck Global Assessment- supple. no bruit auscultated on the right and no bruit auscultated on the left.   Eye Pupil- Bilateral- Regular and Round. Motion- Bilateral- EOMI.   Chest and Lung Exam Auscultation: Breath sounds:- clear at anterior chest wall and - clear at posterior chest wall. Adventitious sounds:- No Adventitious sounds.   Cardiovascular Auscultation:Rhythm- Regular rate and rhythm. Heart Sounds- S1  WNL and S2 WNL. Murmurs & Other Heart Sounds:Auscultation of the heart reveals - No Murmurs.   Abdomen Inspection:Contour- Generalized moderate distention. Palpation/Percussion:Tenderness- Abdomen is non-tender to palpation. Rigidity (guarding)- Abdomen is soft. Auscultation:Auscultation of the abdomen reveals - Bowel sounds normal.   Female Genitourinary   Note: Not done, not pertinent to present illness  Musculoskeletal   Note: She is alert and oriented. No apparent distress.   The left knee shows no effusion. She has a slight varus deformity. Range of motion is about 5-125. She has tenderness medial greater than lateral. No instability is noted. Pulse, sensation and motor are intact.  The right knee shows no effusion. Range of motion is 0-135. Slight crepitus on range of motion. Tenderness noted on the medial joint line.  Previous RADIOGRAPHS: AP both knees and lateral show she has bone on bone arthritis in the medial compartment of the left knee. She does not have any involvement of the lateral or patellofemoral compartments.   Assessment & Plan Primary osteoarthritis of both knees (715.16) Impression: Right Knee and Left Knee  Note: Plan is for a Left Unicompatmental Replacement by Dr. Lequita Halt.  Plan is to go home.  PCP - Dr. Cam Hai - Patient has been seen preoperatively and felt to be stable for surgery.  The patient does not have any contraindications and will receive TXA (tranexamic acid) prior to surgery.  Signed electronically by Lauraine Rinne, III PA-C

## 2013-10-27 NOTE — Anesthesia Procedure Notes (Signed)
Spinal  Patient location during procedure: OR Staffing Performed by: anesthesiologist  Preanesthetic Checklist Completed: patient identified, site marked, surgical consent, pre-op evaluation, timeout performed, IV checked, risks and benefits discussed and monitors and equipment checked Spinal Block Patient position: sitting Prep: Betadine Patient monitoring: heart rate, continuous pulse ox and blood pressure Approach: right paramedian Location: L3-4 Injection technique: single-shot Needle Needle type: Spinocan  Needle gauge: 22 G Needle length: 9 cm Additional Notes Expiration date of kit checked and confirmed. Patient tolerated procedure well, without complications.

## 2013-10-27 NOTE — Interval H&P Note (Signed)
History and Physical Interval Note:  10/27/2013 12:34 PM  Shelly [name]  has presented today for surgery, with the diagnosis of LEFT KNEE MEDIAL COMPARTMENT  OA  The various methods of treatment have been discussed with the patient and family. After consideration of risks, benefits and other options for treatment, the patient has consented to  Procedure(s): LEFT KNEE UNICOMPARTMENTAL REPLACEMENT  (Left) as a surgical intervention .  The patient's history has been reviewed, patient examined, no change in status, stable for surgery.  I have reviewed the patient's chart and labs.  Questions were answered to the patient's satisfaction.     Gus Rankin Tykera Skates

## 2013-10-27 NOTE — Anesthesia Preprocedure Evaluation (Addendum)
Anesthesia Evaluation  Patient identified by MRN, date of birth, ID band Patient awake    Reviewed: Allergy & Precautions, H&P , NPO status , Patient's Chart, lab work & pertinent test results  Airway Mallampati: II TM Distance: >3 FB Neck ROM: Full    Dental no notable dental hx.    Pulmonary asthma , former smoker,  breath sounds clear to auscultation  Pulmonary exam normal       Cardiovascular hypertension, Pt. on medications Rhythm:Regular Rate:Normal     Neuro/Psych negative neurological ROS  negative psych ROS   GI/Hepatic negative GI ROS, Neg liver ROS,   Endo/Other  diabetes, Type 2  Renal/GU negative Renal ROS  negative genitourinary   Musculoskeletal negative musculoskeletal ROS (+)   Abdominal   Peds negative pediatric ROS (+)  Hematology negative hematology ROS (+)   Anesthesia Other Findings   Reproductive/Obstetrics negative OB ROS                          Anesthesia Physical Anesthesia Plan  ASA: III  Anesthesia Plan: Spinal   Post-op Pain Management:    Induction:   Airway Management Planned: Simple Face Mask  Additional Equipment:   Intra-op Plan:   Post-operative Plan:   Informed Consent: I have reviewed the patients History and Physical, chart, labs and discussed the procedure including the risks, benefits and alternatives for the proposed anesthesia with the patient or authorized representative who has indicated his/her understanding and acceptance.   Dental advisory given  Plan Discussed with: CRNA  Anesthesia Plan Comments:         Anesthesia Quick Evaluation

## 2013-10-28 LAB — BASIC METABOLIC PANEL
BUN: 17 mg/dL (ref 6–23)
CALCIUM: 8.9 mg/dL (ref 8.4–10.5)
CO2: 26 meq/L (ref 19–32)
CREATININE: 1.11 mg/dL — AB (ref 0.50–1.10)
Chloride: 100 mEq/L (ref 96–112)
GFR calc Af Amer: 63 mL/min — ABNORMAL LOW (ref >90)
GFR calc non Af Amer: 54 mL/min — ABNORMAL LOW (ref >90)
GLUCOSE: 352 mg/dL — AB (ref 70–99)
Potassium: 5.1 mEq/L (ref 3.7–5.3)
Sodium: 137 mEq/L (ref 137–147)

## 2013-10-28 LAB — GLUCOSE, CAPILLARY
Glucose-Capillary: 328 mg/dL — ABNORMAL HIGH (ref 70–99)
Glucose-Capillary: 287 mg/dL — ABNORMAL HIGH (ref 70–99)

## 2013-10-28 LAB — CBC
HEMATOCRIT: 30 % — AB (ref 36.0–46.0)
HEMOGLOBIN: 10.2 g/dL — AB (ref 12.0–15.0)
MCH: 29.1 pg (ref 26.0–34.0)
MCHC: 34 g/dL (ref 30.0–36.0)
MCV: 85.7 fL (ref 78.0–100.0)
Platelets: 323 10*3/uL (ref 150–400)
RBC: 3.5 MIL/uL — ABNORMAL LOW (ref 3.87–5.11)
RDW: 13.7 % (ref 11.5–15.5)
WBC: 16.9 10*3/uL — AB (ref 4.0–10.5)

## 2013-10-28 MED ORDER — OXYCODONE HCL 5 MG PO TABS: 5.0000 mg | ORAL_TABLET | ORAL | Status: DC | PRN

## 2013-10-28 MED ORDER — RIVAROXABAN 10 MG PO TABS: 10.0000 mg | ORAL_TABLET | Freq: Every day | ORAL | Status: DC

## 2013-10-28 MED ORDER — TRAMADOL HCL 50 MG PO TABS: 50.0000 mg | ORAL_TABLET | Freq: Four times a day (QID) | ORAL | Status: DC | PRN

## 2013-10-28 MED ORDER — METHOCARBAMOL 750 MG PO TABS: 750.0000 mg | ORAL_TABLET | Freq: Four times a day (QID) | ORAL | Status: DC | PRN

## 2013-10-28 NOTE — Discharge Instructions (Signed)
Dr. Gaynelle Arabian Total Joint Specialist [facility] [address] [place], [place] [address] [phone]  UNI KNEE REPLACEMENT POSTOPERATIVE DIRECTIONS   Knee Rehabilitation, Guidelines Following Surgery  Results after knee surgery are often greatly improved when you follow the exercise, range of motion and muscle strengthening exercises prescribed by your doctor. Safety measures are also important to protect the knee from further injury. Any time any of these exercises cause you to have increased pain or swelling in your knee joint, decrease the amount until you are comfortable again and slowly increase them. If you have problems or questions, call your caregiver or physical therapist for advice.   HOME CARE INSTRUCTIONS  Remove items at home which could result in a fall. This includes throw rugs or furniture in walking pathways.  Continue medications as instructed at time of discharge. You may have some home medications which will be placed on hold until you complete the course of blood thinner medication.  You may start showering once you are discharged home but do not submerge the incision under water. Just pat the incision dry and apply a dry gauze dressing on daily. Walk with walker as instructed.  You may resume a sexual relationship in one month or when given the OK by  your doctor.   Use walker as long as suggested by your caregivers.  Avoid periods of inactivity such as sitting longer than an hour when not asleep. This helps prevent blood clots.  You may put full weight on your legs and walk as much as is comfortable.  You may return to work once you are cleared by your doctor.  Do not drive a car for 6 weeks or until released by you surgeon.   Do not drive while taking narcotics.  Wear the elastic stockings for three weeks following surgery during the day but you may remove then at night. Make sure you keep all of your appointments after your  operation with all of your doctors and caregivers. You should call the office at the above phone number and make an appointment for approximately two weeks after the date of your surgery. Change the dressing daily and reapply a dry dressing each time. Please pick up a stool softener and laxative for home use as long as you are requiring pain medications.  Continue to use ice on the knee for pain and swelling from surgery. You may notice swelling that will progress down to the foot and ankle.  This is normal after surgery.  Elevate the leg when you are not up walking on it.   It is important for you to complete the blood thinner medication as prescribed by your doctor.  Continue to use the breathing machine which will help keep your temperature down.  It is common for your temperature to cycle up and down following surgery, especially at night when you are not up moving around and exerting yourself.  The breathing machine keeps your lungs expanded and your temperature down.  RANGE OF MOTION AND STRENGTHENING EXERCISES  Rehabilitation of the knee is important following a knee injury or an operation. After just a few days of immobilization, the muscles of the thigh which control the knee become weakened and shrink (atrophy). Knee exercises are designed to build up the tone and strength of the thigh muscles and to improve knee motion. Often times heat used for twenty to thirty minutes before working out will loosen up your tissues and help with improving the range  of motion but do not use heat for the first two weeks following surgery. These exercises can be done on a training (exercise) mat, on the floor, on a table or on a bed. Use what ever works the best and is most comfortable for you Knee exercises include:  Leg Lifts - While your knee is still immobilized in a splint or cast, you can do straight leg raises. Lift the leg to 60 degrees, hold for 3 sec, and slowly lower the leg. Repeat 10-20 times 2-3  times daily. Perform this exercise against resistance later as your knee gets better.  Quad and Hamstring Sets - Tighten up the muscle on the front of the thigh (Quad) and hold for 5-10 sec. Repeat this 10-20 times hourly. Hamstring sets are done by pushing the foot backward against an object and holding for 5-10 sec. Repeat as with quad sets.  A rehabilitation program following serious knee injuries can speed recovery and prevent re-injury in the future due to weakened muscles. Contact your doctor or a physical therapist for more information on knee rehabilitation.   SKILLED REHAB INSTRUCTIONS: If the patient is transferred to a skilled rehab facility following release from the hospital, a list of the current medications will be sent to the facility for the patient to continue.  When discharged from the skilled rehab facility, please have the facility set up the patient's Home Health Physical Therapy prior to being released. Also, the skilled facility will be responsible for providing the patient with their medications at time of release from the facility to include their pain medication, the muscle relaxants, and their blood thinner medication. If the patient is still at the rehab facility at time of the two week follow up appointment, the skilled rehab facility will also need to assist the patient in arranging follow up appointment in our office and any transportation needs.  MAKE SURE YOU:  Understand these instructions.  Will watch your condition.  Will get help right away if you are not doing well or get worse.    Pick up stool softner and laxative for home. Do not submerge incision under water. May shower. Continue to use ice for pain and swelling from surgery.  Take Xarelto for one week, then discontinue Xarelto. After completing the Xarelto, take a full dose 325 mg Aspirin for three more weeks.   Information on my medicine - XARELTO (Rivaroxaban)  This medication education was  reviewed with me or my healthcare representative as part of my discharge preparation.  The pharmacist that spoke with me during my hospital stay was:  Teressa Lowermanda M Okey Zelek, [facility] Institute[facility]PH  Why was Xarelto prescribed for you? Xarelto was prescribed for you to reduce the risk of blood clots forming after orthopedic surgery. The medical term for these abnormal blood clots is venous thromboembolism (VTE).  What do you need to know about xarelto ? Take your Xarelto ONCE DAILY at the same time every day. You may take it either with or without food.  If you have difficulty swallowing the tablet whole, you may crush it and mix in applesauce just prior to taking your dose.  Take Xarelto exactly as prescribed by your doctor and DO NOT stop taking Xarelto without talking to the doctor who prescribed the medication.  Stopping without other VTE prevention medication to take the place of Xarelto may increase your risk of developing a clot.  After discharge, you should have regular check-up appointments with your healthcare provider that is prescribing your Xarelto.  What do you do if you miss a dose? If you miss a dose, take it as soon as you remember on the same day then continue your regularly scheduled once daily regimen the next day. Do not take two doses of Xarelto on the same day.   Important Safety Information A possible side effect of Xarelto is bleeding. You should call your healthcare provider right away if you experience any of the following:   Bleeding from an injury or your nose that does not stop.   Unusual colored urine (red or dark brown) or unusual colored stools (red or black).   Unusual bruising for unknown reasons.   A serious fall or if you hit your head (even if there is no bleeding).  Some medicines may interact with Xarelto and might increase your risk of bleeding while on Xarelto. To help avoid this, consult your healthcare provider or pharmacist prior to using any new prescription  or non-prescription medications, including herbals, vitamins, non-steroidal anti-inflammatory drugs (NSAIDs) and supplements.  This website has more information on Xarelto: VisitDestination.com.br.

## 2013-10-28 NOTE — Op Note (Signed)
NAMEPARRIS, LUICK NO.:  [id number]  MEDICAL RECORD NO.:  [id number]  LOCATION:  1617                         FACILITY:  [facility]  PHYSICIAN:  Ollen Gross, M.D.    DATE OF BIRTH:  May 09, 1956  DATE OF PROCEDURE:  10/27/2013 DATE OF DISCHARGE:                              OPERATIVE REPORT   PREOPERATIVE DIAGNOSIS:  Left knee medial compartment osteoarthritis.  POSTOPERATIVE DIAGNOSIS:  Left knee medial compartment osteoarthritis.  PROCEDURE:  Left knee medial unicompartmental replacement.  SURGEON:  Ollen Gross, M.D.  ASSISTANT:  Alexzandrew L. Perkins, P.A.C.  ANESTHESIA:  Spinal.  ESTIMATED BLOOD LOSS:  Minimal.  DRAINS:  Hemovac x1.  TOURNIQUET TIME:  41 minutes at 300 mmHg.  COMPLICATIONS:  None.  CONDITION:  Stable to recovery.  BRIEF CLINICAL NOTE:  Shelly [name] is a 58 year old female with a significant medial compartment arthritis of the left knee with pain refractory to cortisone and viscosupplement injections as well as home exercise program.  She presents now for medial unicompartmental replacement as she has isolated medial unicompartmental disease.  PROCEDURE IN DETAIL:  After successful administration of spinal anesthetic, a tourniquet was placed high on her left thigh, and her left lower extremity was prepped and draped in the usual sterile fashion. Extremity was wrapped in Esmarch.  Tourniquet inflated to 300 mmHg. Midline incision was made with a 10-blade through subcutaneous tissue to the level of the extensor mechanism.  Fresh blade was used make a medial parapatellar arthrotomy.  Soft tissue of the proximal medial tibia subperiosteally elevated to the joint line with a knife into the semimembranosus bursa with a Cobb elevator.  Osteophytes were then removed off the medial aspect of the femur and medial tibia, as well as intercondylar area.  The joints inspected the patellofemoral joint was normal as well as the lateral compartment.   Medial meniscus was then removed.  The cutting guide for the ConforMIS femur was then placed on her femur and the outline was traced.  A ring curette was then used to remove any residual cartilage from the femoral surface.  Once this was complete, then the cutting guide was placed again and drilled and locked into position.  The posterior femoral cut was then made.  Then used the high-speed burr to create a trough for the anterior flange of the prosthesis.  The osteophytes were then removed off the tibia and the residual cartilage was removed using a ring curette.  The trial spacer trip was placed and has excellent balance throughout full range of motion.  The tibial cutting guide was then placed with about a 3-degree of posterior slope.  The block was pinned and the vertical cut made with the reciprocating saw and then, a horizontal cut made with the oscillating saw and then the bone fragment removed.  The trial tibia was placed and had excellent fit on the cut bone surface.  The trial femur was also placed and with the 6 mm insert, there was excellent balance throughout full range of motion.  Trial was then removed.  We then prepared the proximal tibia with the 2 lug drills and the keel punch.  The trial components were again placed once again  with excellent stability and balance throughout full range of motion.  The cement was mixed.  While the cement was being mixed, the cut bone surfaces were prepared with pulsatile lavage.  I also drilled multiple holes in the distal femur to serve as conduits for the cement.  Once cement was ready, we first cemented the tibia, then femur, and placed a trial 6 mm insert.  These held in full extension.  All extruded cement removed.  Once again, excellent balance throughout full range of motion.  The 6 mm permanent insert placed after all the extruded cement was removed.  The wound was then copiously irrigated with saline solution.  The periosteum  of the femur as well as the extensor mechanism, subcu tissues were then injected with a total of 20 mL of Exparel mixed with 30 mL of saline, and then a separate injection was performed with 20 mL of 0.25% Marcaine into the same tissues.  The wound was again irrigated and the arthrotomy closed over Hemovac drain with a running #1 V-Loc suture.  Flexion against gravity was about 140 degrees.  Tourniquet released for a total tourniquet time of 41 minutes.  Subcu was then closed with interrupted 2- 0 Vicryl and subcuticular running 4-0 Monocryl.  The incision was cleaned and dried, and Steri-Strips and a bulky sterile dressing applied.  She was awakened and transported to recovery in stable condition.     Ollen Gross, M.D.     FA/MEDQ  D:  10/27/2013  T:  10/28/2013  Job:  [id number]

## 2013-10-28 NOTE — Evaluation (Signed)
Physical Therapy Evaluation Patient Details Name: Shelly [name] MRN: [id number] DOB: 12-17-1955 Today's Date: 10/28/2013   History of Present Illness  Pt is a 58 year old female s/p L unicompartmental knee on 4/17 with PMHx of HTN, DM, arhritis, anxiety.  Clinical Impression  Patient is s/p L uniknee surgery resulting in functional limitations due to the deficits listed below (see PT Problem List).  Patient will benefit from skilled PT to increase their independence and safety with mobility to allow discharge to the venue listed below.  Pt mobilizing well POD #1 and plans to d/c home later today.  Will return this afternoon to ambulate again, assess pain, and perform steps prior to d/c.     Follow Up Recommendations Home health PT    Equipment Recommendations  None recommended by PT    Recommendations for Other Services       Precautions / Restrictions Precautions Precautions: Knee Restrictions Weight Bearing Restrictions: No Other Position/Activity Restrictions: WBAT      Mobility  Bed Mobility Overal bed mobility: Needs Assistance Bed Mobility: Supine to Sit     Supine to sit: Supervision     General bed mobility comments: verbal cues for technique   Transfers Overall transfer level: Needs assistance Equipment used: Rolling walker (2 wheeled) Transfers: Sit to/from Stand Sit to Stand: Min guard         General transfer comment: verbal cues for technique including hand and LE placement  Ambulation/Gait Ambulation/Gait assistance: Min guard Ambulation Distance (Feet): 220 Feet Assistive device: Rolling walker (2 wheeled) Gait Pattern/deviations: Step-to pattern;Antalgic;Decreased step length - left     General Gait Details: verbal cues for sequence, RW distance  Stairs            Wheelchair Mobility    Modified Rankin (Stroke Patients Only)       Balance                                             Pertinent Vitals/Pain  RN brought pain meds end of session, activity to tolerance, ice packs applied    Home Living Family/patient expects to be discharged to:: Private residence Living Arrangements: Alone Available Help at Discharge: Family;Friend(s);Available PRN/intermittently Type of Home: House Home Access: Stairs to enter   Entergy CorporationEntrance Stairs-Number of Steps: 1 Home Layout: One level Home Equipment: Walker - 2 wheels      Prior Function Level of Independence: Independent               Hand Dominance   Dominant Hand: Right    Extremity/Trunk Assessment   Upper Extremity Assessment: Overall WFL for tasks assessed           Lower Extremity Assessment: LLE deficits/detail   LLE Deficits / Details: L knee AAROM -10-54 degrees  Cervical / Trunk Assessment: Normal  Communication   Communication: No difficulties  Cognition Arousal/Alertness: Awake/alert Behavior During Therapy: WFL for tasks assessed/performed Overall Cognitive Status: Within Functional Limits for tasks assessed                      General Comments      Exercises Total Joint Exercises Ankle Circles/Pumps: AROM;Both;10 reps Quad Sets: AROM;Both;10 reps Towel Squeeze: AROM;Both;10 reps Short Arc Quad: AROM;Left;10 reps Heel Slides: AAROM;Left;10 reps Hip ABduction/ADduction: AROM;Left;10 reps Straight Leg Raises: AAROM;Left;10 reps      Assessment/Plan  PT Assessment Patient needs continued PT services  PT Diagnosis Abnormality of gait   PT Problem List Decreased strength;Decreased range of motion;Decreased mobility;Pain  PT Treatment Interventions DME instruction;Gait training;Functional mobility training;Stair training;Therapeutic activities;Therapeutic exercise   PT Goals (Current goals can be found in the Care Plan section) Acute Rehab PT Goals Patient Stated Goal: d/c home today PT Goal Formulation: With patient Time For Goal Achievement: 10/31/13 Potential to Achieve Goals: Good     Frequency 7X/week   Barriers to discharge        Co-evaluation               End of Session Equipment Utilized During Treatment: Gait belt Activity Tolerance: Patient tolerated treatment well Patient left: in chair;with call bell/phone within reach           Time: 0853-0926 PT Time Calculation (min): 33 min   Charges:   PT Evaluation $Initial PT Evaluation Tier I: 1 Procedure PT Treatments $Gait Training: 8-22 mins $Therapeutic Exercise: 8-22 mins   PT G Codes:          Lynnell Catalan 10/28/2013, 1:02 PM Zenovia Jarred, PT, DPT 10/28/2013 Pager: [phone]

## 2013-10-28 NOTE — Progress Notes (Signed)
   Subjective: 1 Day Post-Op Procedure(s) (LRB): LEFT KNEE UNICOMPARTMENTAL REPLACEMENT  (Left) Patient reports pain as mild.   We will start therapy today.  Plan is to go Home after hospital stay.  Objective: Vital signs in last 24 hours: Temp:  [97.5 F (36.4 C)-98.6 F (37 C)] 97.6 F (36.4 C) (04/18 0628) Pulse Rate:  [70-103] 103 (04/18 0628) Resp:  [13-18] 16 (04/18 0628) BP: (127-191)/(55-95) 149/81 mmHg (04/18 0628) SpO2:  [91 %-100 %] 100 % (04/18 0628) Weight:  [214 lb (97.07 kg)] 214 lb (97.07 kg) (04/17 1528)  Intake/Output from previous day:  Intake/Output Summary (Last 24 hours) at 10/28/13 0757 Last data filed at 10/28/13 0656  Gross per 24 hour  Intake 4267.5 ml  Output   4625 ml  Net -357.5 ml    Intake/Output this shift:    Labs:  Recent Labs  10/28/13 0524  HGB 10.2*    Recent Labs  10/28/13 0524  WBC 16.9*  RBC 3.50*  HCT 30.0*  PLT 323    Recent Labs  10/28/13 0524  NA 137  K 5.1  CL 100  CO2 26  BUN 17  CREATININE 1.11*  GLUCOSE 352*  CALCIUM 8.9   No results found for this basename: LABPT, INR,  in the last 72 hours  EXAM General - Patient is Alert, Appropriate and Oriented Extremity - Neurologically intact Neurovascular intact Dorsiflexion/Plantar flexion intact No cellulitis present Compartment soft Dressing - dressing C/D/I Motor Function - intact, moving foot and toes well on exam.  Hemovac pulled without difficulty.  Past Medical History  Diagnosis Date  . Diabetes mellitus   . Hypertension   . Hyperlipemia   . Acid reflux   . Anxiety   . Obesity   . Asthma     infrequent problems  . Arthritis   . Rash     sides of neck  . Stress incontinence   . Complication of anesthesia     "I quit breathing during surgery" 2008 - has had surgery since with no problems     Assessment/Plan: 1 Day Post-Op Procedure(s) (LRB): LEFT KNEE UNICOMPARTMENTAL REPLACEMENT  (Left) Principal Problem:   OA  (osteoarthritis) of knee   Advance diet Up with therapy D/C IV fluids Discharge home with home health  DVT Prophylaxis - Xarelto Weight-Bearing as tolerated to left leg  Gus Rankin Jiayi Lengacher 10/28/2013, 7:57 AM

## 2013-10-28 NOTE — Evaluation (Signed)
Occupational Therapy Evaluation Patient Details Name: Shelly [name] MRN: [id number] DOB: [birth year] Today's Date: 10/28/2013    History of Present Illness Pt is a 58 year old female s/p L unicompartmental knee on 4/17 with PMHx of HTN, DM, arhritis, anxiety.   Clinical Impression   Pt somewhat limited by 8/10 pain in L knee with ambulation and transfers.  Able to access L foot for ADL, toilet, and stand at sink for grooming with supervision. Instructed in shower transfer and transporting items with RW and safe footwear. Pt will have family and friends in and out throughout the day and all night to assist with homemaking and cooking. No further OT needs.    Follow Up Recommendations  No OT follow up    Equipment Recommendations  None recommended by OT    Recommendations for Other Services       Precautions / Restrictions Restrictions Weight Bearing Restrictions: No      Mobility Bed Mobility Overal bed mobility: Modified Independent                Transfers Overall transfer level: Needs assistance Equipment used: Rolling walker (2 wheeled) Transfers: Sit to/from Stand Sit to Stand: Supervision              Balance                                            ADL Overall ADL's : Needs assistance/impaired Eating/Feeding: Independent;Sitting   Grooming: Wash/dry hands;Supervision/safety   Upper Body Bathing: Modified independent;Sitting   Lower Body Bathing: Supervison/ safety;Sit to/from stand   Upper Body Dressing : Set up;Sitting   Lower Body Dressing: Supervision/safety;Sit to/from stand   Toilet Transfer: Supervision/safety;Ambulation   Toileting- Clothing Manipulation and Hygiene: Independent;Sitting/lateral lean   Tub/ Shower Transfer: Ambulation;Supervision/safety   Functional mobility during ADLs: Supervision/safety;Rolling walker General ADL Comments: Pt is able to reach her L foot for ADL.  Has a large shower with built  in shower seat.  Instructed in using RW as grab bar as she steps over edge of shower and to perform showering when someone is in the home intially.  Instructed in transporting items with RW safely.     Vision                     Perception     Praxis      Pertinent Vitals/Pain 8/10 L knee, RN aware, premedicated     Hand Dominance Right   Extremity/Trunk Assessment Upper Extremity Assessment Upper Extremity Assessment: Overall WFL for tasks assessed   Lower Extremity Assessment Lower Extremity Assessment: Defer to PT evaluation   Cervical / Trunk Assessment Cervical / Trunk Assessment: Normal   Communication Communication Communication: No difficulties   Cognition Arousal/Alertness: Awake/alert Behavior During Therapy: WFL for tasks assessed/performed Overall Cognitive Status: Within Functional Limits for tasks assessed                     General Comments       Exercises       Shoulder Instructions      Home Living Family/patient expects to be discharged to:: Private residence Living Arrangements: Alone Available Help at Discharge: Family;Friend(s);Available PRN/intermittently Type of Home: House Home Access: Stairs to enter Entrance Stairs-Number of Steps: 1   Home Layout: One level     Bathroom Shower/Tub: Walk-in shower  Bathroom Toilet: Standard     Home Equipment: Environmental consultant - 2 wheels          Prior Functioning/Environment Level of Independence: Independent             OT Diagnosis:     OT Problem List:     OT Treatment/Interventions:      OT Goals(Current goals can be found in the care plan section) Acute Rehab OT Goals Patient Stated Goal: Home today.  Pain free knee.  OT Frequency:     Barriers to D/C:            Co-evaluation              End of Session    Activity Tolerance: Patient limited by pain Patient left: in bed;with call bell/phone within reach;with family/visitor present   Time:  4098-1191 OT Time Calculation (min): 31 min Charges:  OT General Charges $OT Visit: 1 Procedure OT Evaluation $Initial OT Evaluation Tier I: 1 Procedure OT Treatments $Self Care/Home Management : 8-22 mins G-Codes:    [place] Bailiff Glenford Garis Nov 19, 2013, 11:38 AM [phone]

## 2013-10-28 NOTE — Progress Notes (Signed)
Physical Therapy Treatment Note   10/28/13 1400  PT Visit Information  Last PT Received On 10/28/13  Assistance Needed +1  History of Present Illness Pt is a 58 year old female s/p L unicompartmental knee on 4/17 with PMHx of HTN, DM, arhritis, anxiety.  PT Time Calculation  PT Start Time 1350  PT Stop Time 1414  PT Time Calculation (min) 24 min  Subjective Data  Subjective Pt mobilizing well.  Pt ambulated in hallway and practiced one step with daughter present (daughter also educated on safe transfer technique and step technique).  Pt wished to review exercises for daughter to be educated.  Pt performed approx 5 reps of each exercise in recliner for daughter to observe then assisted back to bed per pt preference.  Pt and daughter had no further questions.  Patient Stated Goal d/c home today  Precautions  Precautions Knee  Restrictions  Other Position/Activity Restrictions WBAT  Cognition  Arousal/Alertness Awake/alert  Behavior During Therapy WFL for tasks assessed/performed  Overall Cognitive Status Within Functional Limits for tasks assessed  Bed Mobility  Overal bed mobility Modified Independent  General bed mobility comments increased time  Transfers  Overall transfer level Needs assistance  Equipment used Rolling walker (2 wheeled)  Transfers Sit to/from Stand  Sit to Stand Supervision  General transfer comment verbal cues for technique including hand and LE placement  Ambulation/Gait  Ambulation/Gait assistance Min guard;Supervision  Ambulation Distance (Feet) 140 Feet  Assistive device Rolling walker (2 wheeled)  Gait Pattern/deviations Step-to pattern;Antalgic;Decreased step length - left  General Gait Details verbal cues for sequence, RW distance  Stairs Yes  Stairs assistance Min guard  Stair Management Step to pattern;Forwards;With walker  Number of Stairs 1 (performed twice)  General stair comments pt and daughter educated in safe stair technique   Total Joint  Exercises  Ankle Circles/Pumps AROM;Both;5 reps  Quad Sets AROM;Left;5 reps  Short Arc Quad Other (comment) (discussed but not performed)  Heel Slides AROM;Left;5 reps  Hip ABduction/ADduction AROM;5 reps;Left  PT - End of Session  Activity Tolerance Patient tolerated treatment well  Patient left in bed;with call bell/phone within reach;with family/visitor present  PT - Assessment/Plan  PT Plan Current plan remains appropriate  PT Frequency 7X/week  Follow Up Recommendations Home health PT  PT equipment None recommended by PT  PT Goal Progression  Progress towards PT goals Progressing toward goals  PT General Charges  $$ ACUTE PT VISIT 1 Procedure  PT Treatments  $Gait Training 8-22 mins  $Therapeutic Exercise 8-22 mins   Zenovia Jarred, PT, DPT 10/28/2013 Pager: [phone]

## 2013-10-28 NOTE — Progress Notes (Signed)
CARE MANAGEMENT NOTE 10/28/2013  Patient:  Quin,Cheyenne A   Account Number:  [id number]  Date Initiated:  10/28/2013  Documentation initiated by:  [facility]  Subjective/Objective Assessment:   LEFT KNEE UNICOMPARTMENTAL REPLACEMENT  (Left)     Action/Plan:   HH   Anticipated DC Date:  10/28/2013   Anticipated DC Plan:  HOME W HOME HEALTH SERVICES      DC Planning Services  CM consult      [facility] Choice  HOME HEALTH   Choice offered to / List presented to:  C-1 Patient        HH arranged  HH-2 PT      HH agency  [facility]   Status of service:  Completed, signed off Medicare Important Message given?   (If response is "NO", the following Medicare IM given date fields will be blank) Date Medicare IM given:   Date Additional Medicare IM given:    Discharge Disposition:  HOME W HOME HEALTH SERVICES  Per UR Regulation:    If discussed at Long Length of Stay Meetings, dates discussed:    Comments:  10/28/2013 1400 NCM spoke to pt and offered choice for [facility]. Pt agreeable to [facility] for HH. Pt states she has RW at home. Provided contact info for [facility].  Isidoro Donning RN CCM Case Mgmt phone [phone]

## 2013-10-28 NOTE — Progress Notes (Signed)
Pt stable, scripts, d/c instructions given with no questions/concerns voiced by pt or family.  Pt transported via wheelchair to private vehicle by NT or family.

## 2013-10-30 ENCOUNTER — Encounter (HOSPITAL_COMMUNITY): Payer: Self-pay | Admitting: Orthopedic Surgery

## 2013-11-23 NOTE — Discharge Summary (Signed)
Physician Discharge Summary   Patient ID: Shelly [name] MRN: [id number] DOB/AGE: Aug 29, 1955 58 y.o.  Admit date: 10/27/2013 Discharge date: 10/28/2013  Primary Diagnosis:  Left knee medial compartment osteoarthritis.  Admission Diagnoses:  Past Medical History  Diagnosis Date  . Diabetes mellitus   . Hypertension   . Hyperlipemia   . Acid reflux   . Anxiety   . Obesity   . Asthma     infrequent problems  . Arthritis   . Rash     sides of neck  . Stress incontinence   . Complication of anesthesia     "I quit breathing during surgery" 2008 - has had surgery since with no problems    Discharge Diagnoses:   Principal Problem:   OA (osteoarthritis) of knee  Estimated body mass index is 39.13 kg/(m^2) as calculated from the following:   Height as of this encounter: _0  (1.575 m).   Weight as of this encounter: 97.07 kg (214 lb).  Procedure:  Procedure(s) (LRB): LEFT KNEE UNICOMPARTMENTAL REPLACEMENT  (Left)   Consults: None  HPI: Shelly [name] is a 58 year old female with a significant  medial compartment arthritis of the left knee with pain refractory to  cortisone and viscosupplement injections as well as home exercise  program. She presents now for medial unicompartmental replacement as  she has isolated medial unicompartmental disease.  Laboratory Data: Admission on 10/27/2013, Discharged on 10/28/2013  Component Date Value Ref Range Status  . ABO/RH(D) 10/27/2013 O NEG   Final  . Antibody Screen 10/27/2013 NEG   Final  . Sample Expiration 10/27/2013 10/30/2013   Final  . Glucose-Capillary 10/27/2013 179* 70 - 99 mg/dL Final  . Comment 1 10/27/2013 Documented in Chart   Final  . ABO/RH(D) 10/27/2013 O NEG   Final  . Glucose-Capillary 10/27/2013 119* 70 - 99 mg/dL Final  . Comment 1 10/27/2013 Documented in Chart   Final  . Comment 2 10/27/2013 Notify RN   Final  . WBC 10/28/2013 16.9* 4.0 - 10.5 K/uL Final  . RBC 10/28/2013 3.50* 3.87 - 5.11 MIL/uL Final  .  Hemoglobin 10/28/2013 10.2* 12.0 - 15.0 g/dL Final  . HCT 10/28/2013 30.0* 36.0 - 46.0 % Final  . MCV 10/28/2013 85.7  78.0 - 100.0 fL Final  . MCH 10/28/2013 29.1  26.0 - 34.0 pg Final  . MCHC 10/28/2013 34.0  30.0 - 36.0 g/dL Final  . RDW 10/28/2013 13.7  11.5 - 15.5 % Final  . Platelets 10/28/2013 323  150 - 400 K/uL Final  . Sodium 10/28/2013 137  137 - 147 mEq/L Final  . Potassium 10/28/2013 5.1  3.7 - 5.3 mEq/L Final  . Chloride 10/28/2013 100  96 - 112 mEq/L Final  . CO2 10/28/2013 26  19 - 32 mEq/L Final  . Glucose, Bld 10/28/2013 352* 70 - 99 mg/dL Final  . BUN 10/28/2013 17  6 - 23 mg/dL Final  . Creatinine, Ser 10/28/2013 1.11* 0.50 - 1.10 mg/dL Final  . Calcium 10/28/2013 8.9  8.4 - 10.5 mg/dL Final  . GFR calc non Af Amer 10/28/2013 54* >90 mL/min Final  . GFR calc Af Amer 10/28/2013 63* >90 mL/min Final   Comment: (NOTE)                          The eGFR has been calculated using the CKD EPI equation.  This calculation has not been validated in all clinical situations.                          eGFR's persistently <90 mL/min signify possible Chronic Kidney                          Disease.  . Glucose-Capillary 10/27/2013 224* 70 - 99 mg/dL Final  . Glucose-Capillary 10/27/2013 334* 70 - 99 mg/dL Final  . Glucose-Capillary 10/28/2013 328* 70 - 99 mg/dL Final  . Glucose-Capillary 10/28/2013 287* 70 - 99 mg/dL Final  Hospital Outpatient Visit on 10/19/2013  Component Date Value Ref Range Status  . MRSA, PCR 10/19/2013 NEGATIVE  NEGATIVE Final  . Staphylococcus aureus 10/19/2013 POSITIVE* NEGATIVE Final   Comment:                                 The Xpert SA Assay (FDA                          approved for NASAL specimens                          in patients over 5 years of age),                          is one component of                          a comprehensive surveillance                          program.  Test performance has                           been validated by [employer] for patients greater                          than or equal to 36 year old.                          It is not intended                          to diagnose infection nor to                          guide or monitor treatment.  Marland Kitchen aPTT 10/19/2013 48* 24 - 37 seconds Final   Comment:                                 IF BASELINE aPTT IS ELEVATED,                          SUGGEST PATIENT RISK ASSESSMENT  BE USED TO DETERMINE APPROPRIATE                          ANTICOAGULANT THERAPY.  . WBC 10/19/2013 12.9* 4.0 - 10.5 K/uL Final  . RBC 10/19/2013 3.66* 3.87 - 5.11 MIL/uL Final  . Hemoglobin 10/19/2013 10.5* 12.0 - 15.0 g/dL Final  . HCT 10/19/2013 31.8* 36.0 - 46.0 % Final  . MCV 10/19/2013 86.9  78.0 - 100.0 fL Final  . MCH 10/19/2013 28.7  26.0 - 34.0 pg Final  . MCHC 10/19/2013 33.0  30.0 - 36.0 g/dL Final  . RDW 10/19/2013 13.9  11.5 - 15.5 % Final  . Platelets 10/19/2013 338  150 - 400 K/uL Final  . Sodium 10/19/2013 139  137 - 147 mEq/L Final  . Potassium 10/19/2013 4.4  3.7 - 5.3 mEq/L Final  . Chloride 10/19/2013 100  96 - 112 mEq/L Final  . CO2 10/19/2013 28  19 - 32 mEq/L Final  . Glucose, Bld 10/19/2013 171* 70 - 99 mg/dL Final  . BUN 10/19/2013 17  6 - 23 mg/dL Final  . Creatinine, Ser 10/19/2013 1.00  0.50 - 1.10 mg/dL Final  . Calcium 10/19/2013 9.3  8.4 - 10.5 mg/dL Final  . Total Protein 10/19/2013 7.2  6.0 - 8.3 g/dL Final  . Albumin 10/19/2013 3.2* 3.5 - 5.2 g/dL Final  . AST 10/19/2013 12  0 - 37 U/L Final  . ALT 10/19/2013 11  0 - 35 U/L Final  . Alkaline Phosphatase 10/19/2013 79  39 - 117 U/L Final  . Total Bilirubin 10/19/2013 0.6  0.3 - 1.2 mg/dL Final  . GFR calc non Af Amer 10/19/2013 61* >90 mL/min Final  . GFR calc Af Amer 10/19/2013 71* >90 mL/min Final   Comment: (NOTE)                          The eGFR has been calculated using the CKD EPI equation.                           This calculation has not been validated in all clinical situations.                          eGFR's persistently <90 mL/min signify possible Chronic Kidney                          Disease.  Marland Kitchen Prothrombin Time 10/19/2013 14.6  11.6 - 15.2 seconds Final  . INR 10/19/2013 1.16  0.00 - 1.49 Final  . Color, Urine 10/19/2013 YELLOW  YELLOW Final  . APPearance 10/19/2013 CLEAR  CLEAR Final  . Specific Gravity, Urine 10/19/2013 1.008  1.005 - 1.030 Final  . pH 10/19/2013 6.0  5.0 - 8.0 Final  . Glucose, UA 10/19/2013 500* NEGATIVE mg/dL Final  . Hgb urine dipstick 10/19/2013 NEGATIVE  NEGATIVE Final  . Bilirubin Urine 10/19/2013 NEGATIVE  NEGATIVE Final  . Ketones, ur 10/19/2013 NEGATIVE  NEGATIVE mg/dL Final  . Protein, ur 10/19/2013 NEGATIVE  NEGATIVE mg/dL Final  . Urobilinogen, UA 10/19/2013 0.2  0.0 - 1.0 mg/dL Final  . Nitrite 10/19/2013 NEGATIVE  NEGATIVE Final  . Leukocytes, UA 10/19/2013 NEGATIVE  NEGATIVE Final   MICROSCOPIC NOT DONE ON URINES WITH NEGATIVE PROTEIN, BLOOD, LEUKOCYTES, NITRITE, OR GLUCOSE <1000 mg/dL.  X-Rays:No results found.  EKG: Orders placed in visit on 10/19/13  . EKG 12-LEAD     Hospital Course: Shelly [name] is a 58 y.o. who was admitted to [facility]. They were brought to the operating room on 10/27/2013 and underwent Procedure(s): LEFT KNEE UNICOMPARTMENTAL REPLACEMENT .  Patient tolerated the procedure well and was later transferred to the recovery room and then to the orthopaedic floor for postoperative care.  They were given PO and IV analgesics for pain control following their surgery.  They were given 24 hours of postoperative antibiotics of  Anti-infectives   Start     Dose/Rate Route Frequency Ordered Stop   10/27/13 1900  ceFAZolin (ANCEF) IVPB 2 g/50 mL premix     2 g 100 mL/hr over 30 Minutes Intravenous Every 6 hours 10/27/13 1544 10/28/13 0056   10/27/13 1041  ceFAZolin (ANCEF) IVPB 2 g/50 mL premix     2 g 100 mL/hr  over 30 Minutes Intravenous On call to O.R. 10/27/13 1041 10/27/13 1251     and started on DVT prophylaxis in the form of Xarelto.   PT and OT were ordered for total joint protocol.  Discharge planning consulted to help with postop disposition and equipment needs.  Patient had a good night on the evening of surgery.  They started to get up OOB with therapy on day one. Hemovac drain was pulled without difficulty.   Patient was seen in rounds and was ready to go home later that same day.   Diet: Regular diet Activity:WBAT Follow-up:in 2 weeks Disposition - Home Discharged Condition: good      Medication List    STOP taking these medications       HYDROcodone-acetaminophen 5-325 MG per tablet  Commonly known as:  NORCO/VICODIN      TAKE these medications       albuterol 108 (90 BASE) MCG/ACT inhaler  Commonly known as:  PROVENTIL HFA;VENTOLIN HFA  Inhale 1 puff into the lungs every 6 (six) hours as needed for wheezing or shortness of breath.     ALPRAZolam 0.5 MG tablet  Commonly known as:  XANAX  Take 0.5 mg by mouth 3 (three) times daily as needed. Anxiety     cetirizine 10 MG tablet  Commonly known as:  ZYRTEC  Take 10 mg by mouth daily.     insulin NPH Human 100 UNIT/ML injection  Commonly known as:  HUMULIN N,NOVOLIN N  Inject 25-35 Units into the skin 2 (two) times daily before a meal. SS     losartan 100 MG tablet  Commonly known as:  COZAAR  Take 100 mg by mouth every morning.     methocarbamol 750 MG tablet  Commonly known as:  ROBAXIN  Take 1 tablet (750 mg total) by mouth every 6 (six) hours as needed for muscle spasms (can have PO or IV).     montelukast 10 MG tablet  Commonly known as:  SINGULAIR  Take 10 mg by mouth at bedtime.     omeprazole 20 MG tablet  Commonly known as:  PRILOSEC OTC  Take 20 mg by mouth daily.     oxyCODONE 5 MG immediate release tablet  Commonly known as:  Oxy IR/ROXICODONE  Take 1-2 tablets (5-10 mg total) by mouth every 3  (three) hours as needed for breakthrough pain.     rivaroxaban 10 MG Tabs tablet  Commonly known as:  XARELTO  Take 1 tablet (10 mg total) by mouth daily with breakfast.  rosuvastatin 10 MG tablet  Commonly known as:  CRESTOR  Take 10 mg by mouth every evening.     traMADol 50 MG tablet  Commonly known as:  ULTRAM  Take 1-2 tablets (50-100 mg total) by mouth every 6 (six) hours as needed for moderate pain.           Follow-up Information   Follow up with Gearlean Alf, MD. Schedule an appointment as soon as possible for a visit on 11/07/2013. (CAll [phone] Monday to make the appointment)    Specialty:  Orthopedic Surgery   Contact information:   [address] [place] [address] [phone]       Signed: Arlee Muslim, PA-C Orthopaedic Surgery 11/23/2013, 10:11 AM

## 2013-12-11 DIAGNOSIS — J189 Pneumonia, unspecified organism: Secondary | ICD-10-CM

## 2013-12-11 HISTORY — DX: Pneumonia, unspecified organism: J18.9

## 2014-01-31 ENCOUNTER — Other Ambulatory Visit: Payer: Self-pay | Admitting: Orthopedic Surgery

## 2014-01-31 DIAGNOSIS — M25561 Pain in right knee: Secondary | ICD-10-CM

## 2014-02-09 ENCOUNTER — Other Ambulatory Visit: Payer: Self-pay | Admitting: Family Medicine

## 2014-02-09 DIAGNOSIS — Z1231 Encounter for screening mammogram for malignant neoplasm of breast: Secondary | ICD-10-CM

## 2014-02-16 ENCOUNTER — Encounter (INDEPENDENT_AMBULATORY_CARE_PROVIDER_SITE_OTHER): Payer: Self-pay

## 2014-02-16 ENCOUNTER — Ambulatory Visit
Admission: RE | Admit: 2014-02-16 | Discharge: 2014-02-16 | Disposition: A | Discharge status: Home / Self Care | Payer: 59 | Source: Ambulatory Visit | Attending: Family Medicine | Admitting: Family Medicine

## 2014-02-16 DIAGNOSIS — Z1231 Encounter for screening mammogram for malignant neoplasm of breast: Secondary | ICD-10-CM

## 2014-03-02 ENCOUNTER — Ambulatory Visit
Admission: RE | Admit: 2014-03-02 | Discharge: 2014-03-02 | Disposition: A | Discharge status: Home / Self Care | Payer: 59 | Source: Ambulatory Visit | Attending: Orthopedic Surgery | Admitting: Orthopedic Surgery

## 2014-03-02 ENCOUNTER — Other Ambulatory Visit: Payer: Self-pay | Admitting: Family Medicine

## 2014-03-02 ENCOUNTER — Ambulatory Visit
Admission: RE | Admit: 2014-03-02 | Discharge: 2014-03-02 | Disposition: A | Discharge status: Home / Self Care | Payer: 59 | Source: Ambulatory Visit | Attending: Family Medicine | Admitting: Family Medicine

## 2014-03-02 DIAGNOSIS — J189 Pneumonia, unspecified organism: Secondary | ICD-10-CM

## 2014-03-02 DIAGNOSIS — M25561 Pain in right knee: Secondary | ICD-10-CM

## 2014-04-03 ENCOUNTER — Other Ambulatory Visit: Payer: Self-pay | Admitting: Gastroenterology

## 2014-04-26 ENCOUNTER — Other Ambulatory Visit: Payer: Self-pay | Admitting: Orthopedic Surgery

## 2014-04-26 NOTE — Progress Notes (Signed)
Preoperative surgical orders have been place into the Epic hospital system for [employer] on 04/26/2014, 1:41 PM  by Patrica Duel for surgery on 05/21/2014.  Preop Uni Knee orders including Experal, IV Tylenol, and IV Decadron as long as there are no contraindications to the above medications. Avel Peace, PA-C

## 2014-05-10 ENCOUNTER — Encounter (HOSPITAL_COMMUNITY): Payer: Self-pay | Admitting: Pharmacy Technician

## 2014-05-11 ENCOUNTER — Other Ambulatory Visit (HOSPITAL_COMMUNITY): Payer: Self-pay | Admitting: *Deleted

## 2014-05-11 NOTE — Patient Instructions (Addendum)
20 Shelly HoitRonda A [name]  05/11/2014   Your procedure is scheduled on: Monday November 9th, 2015  [facility]  Entrance and follow signs to  Short Stay Center at 625 AM.  Call this number if you have problems the morning of surgery [phone]   Remember:  Do not eat food or drink liquids :After Midnight. EAT A GOOD HEALTHY SNACK NIGHT PRIOR TO SURGERY. TAKE 1/2 DOSE OF INSULIN NIGHT PRIOR TO SURGERY (if needed)      Take these medicines the morning of surgery with A SIP OF WATER: Alprazolam ( Xanax) if needed; Zrytec if needed;                                You may not have any metal on your body including hair pins and piercings  Do not wear jewelry, make-up, lotions, powders, or deodorant.  Can not shave 2 days prior to surgery.  Do not bring valuables to the hospital. [facility] IS NOT RESPONSIBLE FOR VALUABLES.  Contacts, dentures or bridgework may not be worn into surgery.  Leave suitcase in the car. After surgery it may be brought to your room.  For patients admitted to the hospital, checkout time is 11:00 AM the day of discharge.   Special Instructions: MRSA Instruction Sheet;Blood Transfusion Facts Sheet;Incentive Spirometry ________________________________________________________________________  [facility] - Preparing for Surgery Before surgery, you can play an important role.  Because skin is not sterile, your skin needs to be as free of germs as possible.  You can reduce the number of germs on your skin by washing with CHG (chlorahexidine gluconate) soap before surgery.  CHG is an antiseptic cleaner which kills germs and bonds with the skin to continue killing germs even after washing. Please DO NOT use if you have an allergy to CHG or antibacterial soaps.  If your skin becomes reddened/irritated stop using the CHG and inform your nurse when you arrive at Short Stay. Do not shave (including legs and underarms) for at least 48 hours prior to the first CHG  shower.  You may shave your face/neck. Please follow these instructions carefully:  1.  Shower with CHG Soap the night before surgery and the  morning of Surgery.  2.  If you choose to wash your hair, wash your hair first as usual with your  normal  shampoo.  3.  After you shampoo, rinse your hair and body thoroughly to remove the  shampoo.                           4.  Use CHG as you would any other liquid soap.  You can apply chg directly  to the skin and wash                       Gently with a scrungie or clean washcloth.  5.  Apply the CHG Soap to your body ONLY FROM THE NECK DOWN.   Do not use on face/ open                           Wound or open sores. Avoid contact with eyes, ears mouth and genitals (private parts).                       Wash face,  Genitals (  private parts) with your normal soap.             6.  Wash thoroughly, paying special attention to the area where your surgery  will be performed.  7.  Thoroughly rinse your body with warm water from the neck down.  8.  DO NOT shower/wash with your normal soap after using and rinsing off  the CHG Soap.                9.  Pat yourself dry with a clean towel.            10.  Wear clean pajamas.            11.  Place clean sheets on your bed the night of your first shower and do not  sleep with pets. Day of Surgery : Do not apply any lotions/deodorants the morning of surgery.  Please wear clean clothes to the hospital/surgery center.  FAILURE TO FOLLOW THESE INSTRUCTIONS MAY RESULT IN THE CANCELLATION OF YOUR SURGERY PATIENT SIGNATURE_________________________________  NURSE SIGNATURE__________________________________  ________________________________________________________________________   Shelly [name]  An incentive spirometer is a tool that can help keep your lungs clear and active. This tool measures how well you are filling your lungs with each breath. Taking long deep breaths may help reverse or decrease the chance  of developing breathing (pulmonary) problems (especially infection) following:  A long period of time when you are unable to move or be active. BEFORE THE PROCEDURE   If the spirometer includes an indicator to show your best effort, your nurse or respiratory therapist will set it to a desired goal.  If possible, sit up straight or lean slightly forward. Try not to slouch.  Hold the incentive spirometer in an upright position. INSTRUCTIONS FOR USE  1. Sit on the edge of your bed if possible, or sit up as far as you can in bed or on a chair. 2. Hold the incentive spirometer in an upright position. 3. Breathe out normally. 4. Place the mouthpiece in your mouth and seal your lips tightly around it. 5. Breathe in slowly and as deeply as possible, raising the piston or the ball toward the top of the column. 6. Hold your breath for 3-5 seconds or for as long as possible. Allow the piston or ball to fall to the bottom of the column. 7. Remove the mouthpiece from your mouth and breathe out normally. 8. Rest for a few seconds and repeat Steps 1 through 7 at least 10 times every 1-2 hours when you are awake. Take your time and take a few normal breaths between deep breaths. 9. The spirometer may include an indicator to show your best effort. Use the indicator as a goal to work toward during each repetition. 10. After each set of 10 deep breaths, practice coughing to be sure your lungs are clear. If you have an incision (the cut made at the time of surgery), support your incision when coughing by placing a pillow or rolled up towels firmly against it. Once you are able to get out of bed, walk around indoors and cough well. You may stop using the incentive spirometer when instructed by your caregiver.  RISKS AND COMPLICATIONS  Take your time so you do not get dizzy or light-headed.  If you are in pain, you may need to take or ask for pain medication before doing incentive spirometry. It is harder to  take a deep breath if you are having pain. AFTER USE  Rest and breathe slowly and easily.  It can be helpful to keep track of a log of your progress. Your caregiver can provide you with a simple table to help with this. If you are using the spirometer at home, follow these instructions: SEEK MEDICAL CARE IF:   You are having difficultly using the spirometer.  You have trouble using the spirometer as often as instructed.  Your pain medication is not giving enough relief while using the spirometer.  You develop fever of 100.5 F (38.1 C) or higher. SEEK IMMEDIATE MEDICAL CARE IF:   You cough up bloody sputum that had not been present before.  You develop fever of 102 F (38.9 C) or greater.  You develop worsening pain at or near the incision site. MAKE SURE YOU:   Understand these instructions.  Will watch your condition.  Will get help right away if you are not doing well or get worse. Document Released: 11/09/2006 Document Revised: 09/21/2011 Document Reviewed: 01/10/2007 ExitCare Patient Information 2014 ExitCare, [place].   ________________________________________________________________________  WHAT IS A BLOOD TRANSFUSION? Blood Transfusion Information  A transfusion is the replacement of blood or some of its parts. Blood is made up of multiple cells which provide different functions.  Red blood cells carry oxygen and are used for blood loss replacement.  White blood cells fight against infection.  Platelets control bleeding.  Plasma helps clot blood.  Other blood products are available for specialized needs, such as hemophilia or other clotting disorders. BEFORE THE TRANSFUSION  Who gives blood for transfusions?   Healthy volunteers who are fully evaluated to make sure their blood is safe. This is blood bank blood. Transfusion therapy is the safest it has ever been in the practice of medicine. Before blood is taken from a donor, a complete history is taken to  make sure that person has no history of diseases nor engages in risky social behavior (examples are intravenous drug use or sexual activity with multiple partners). The donor's travel history is screened to minimize risk of transmitting infections, such as malaria. The donated blood is tested for signs of infectious diseases, such as HIV and hepatitis. The blood is then tested to be sure it is compatible with you in order to minimize the chance of a transfusion reaction. If you or a relative donates blood, this is often done in anticipation of surgery and is not appropriate for emergency situations. It takes many days to process the donated blood. RISKS AND COMPLICATIONS Although transfusion therapy is very safe and saves many lives, the main dangers of transfusion include:   Getting an infectious disease.  Developing a transfusion reaction. This is an allergic reaction to something in the blood you were given. Every precaution is taken to prevent this. The decision to have a blood transfusion has been considered carefully by your caregiver before blood is given. Blood is not given unless the benefits outweigh the risks. AFTER THE TRANSFUSION  Right after receiving a blood transfusion, you will usually feel much better and more energetic. This is especially true if your red blood cells have gotten low (anemic). The transfusion raises the level of the red blood cells which carry oxygen, and this usually causes an energy increase.  The nurse administering the transfusion will monitor you carefully for complications. HOME CARE INSTRUCTIONS  No special instructions are needed after a transfusion. You may find your energy is better. Speak with your caregiver about any limitations on activity for underlying diseases you may have. SEEK  MEDICAL CARE IF:   Your condition is not improving after your transfusion.  You develop redness or irritation at the intravenous (IV) site. SEEK IMMEDIATE MEDICAL CARE  IF:  Any of the following symptoms occur over the next 12 hours:  Shaking chills.  You have a temperature by mouth above 102 F (38.9 C), not controlled by medicine.  Chest, back, or muscle pain.  People around you feel you are not acting correctly or are confused.  Shortness of breath or difficulty breathing.  Dizziness and fainting.  You get a rash or develop hives.  You have a decrease in urine output.  Your urine turns a dark color or changes to pink, red, or brown. Any of the following symptoms occur over the next 10 days:  You have a temperature by mouth above 102 F (38.9 C), not controlled by medicine.  Shortness of breath.  Weakness after normal activity.  The white part of the eye turns yellow (jaundice).  You have a decrease in the amount of urine or are urinating less often.  Your urine turns a dark color or changes to pink, red, or brown. Document Released: 06/26/2000 Document Revised: 09/21/2011 Document Reviewed: 02/13/2008 [facility] Patient Information 2014 [place], [place].  _______________________________________________________________________

## 2014-05-14 ENCOUNTER — Encounter (HOSPITAL_COMMUNITY)
Admission: RE | Admit: 2014-05-14 | Discharge: 2014-05-14 | Disposition: A | Discharge status: Home / Self Care | Payer: 59 | Source: Ambulatory Visit | Attending: Orthopedic Surgery | Admitting: Orthopedic Surgery

## 2014-05-14 ENCOUNTER — Encounter (HOSPITAL_COMMUNITY): Payer: Self-pay

## 2014-05-14 DIAGNOSIS — Z01812 Encounter for preprocedural laboratory examination: Secondary | ICD-10-CM | POA: Insufficient documentation

## 2014-05-14 HISTORY — DX: Irritable bowel syndrome, unspecified: K58.9

## 2014-05-14 HISTORY — DX: Headache, unspecified: R51.9

## 2014-05-14 HISTORY — DX: Nausea with vomiting, unspecified: R11.2

## 2014-05-14 HISTORY — DX: Pneumonia, unspecified organism: J18.9

## 2014-05-14 HISTORY — DX: Other specified postprocedural states: Z98.890

## 2014-05-14 HISTORY — DX: Endometriosis, unspecified: N80.9

## 2014-05-14 HISTORY — DX: Respiratory tuberculosis unspecified: A15.9

## 2014-05-14 HISTORY — DX: Headache: R51

## 2014-05-14 LAB — CBC
HEMATOCRIT: 32.8 % — AB (ref 36.0–46.0)
Hemoglobin: 10.7 g/dL — ABNORMAL LOW (ref 12.0–15.0)
MCH: 28.4 pg (ref 26.0–34.0)
MCHC: 32.6 g/dL (ref 30.0–36.0)
MCV: 87 fL (ref 78.0–100.0)
PLATELETS: 364 10*3/uL (ref 150–400)
RBC: 3.77 MIL/uL — AB (ref 3.87–5.11)
RDW: 13.9 % (ref 11.5–15.5)
WBC: 16.7 10*3/uL — AB (ref 4.0–10.5)

## 2014-05-14 LAB — APTT: aPTT: 48 seconds — ABNORMAL HIGH (ref 24–37)

## 2014-05-14 LAB — COMPREHENSIVE METABOLIC PANEL
ALT: 10 U/L (ref 0–35)
AST: 12 U/L (ref 0–37)
Albumin: 3.9 g/dL (ref 3.5–5.2)
Alkaline Phosphatase: 87 U/L (ref 39–117)
Anion gap: 14 (ref 5–15)
BILIRUBIN TOTAL: 0.7 mg/dL (ref 0.3–1.2)
BUN: 19 mg/dL (ref 6–23)
CHLORIDE: 95 meq/L — AB (ref 96–112)
CO2: 26 mEq/L (ref 19–32)
CREATININE: 0.93 mg/dL (ref 0.50–1.10)
Calcium: 9.9 mg/dL (ref 8.4–10.5)
GFR calc non Af Amer: 66 mL/min — ABNORMAL LOW (ref >90)
GFR, EST AFRICAN AMERICAN: 77 mL/min — AB (ref >90)
Glucose, Bld: 204 mg/dL — ABNORMAL HIGH (ref 70–99)
Potassium: 4.5 mEq/L (ref 3.7–5.3)
Sodium: 135 mEq/L — ABNORMAL LOW (ref 137–147)
Total Protein: 8.4 g/dL — ABNORMAL HIGH (ref 6.0–8.3)

## 2014-05-14 LAB — URINALYSIS, ROUTINE W REFLEX MICROSCOPIC
BILIRUBIN URINE: NEGATIVE
Glucose, UA: NEGATIVE mg/dL
KETONES UR: NEGATIVE mg/dL
Leukocytes, UA: NEGATIVE
Nitrite: NEGATIVE
PH: 6 (ref 5.0–8.0)
Protein, ur: 30 mg/dL — AB
Specific Gravity, Urine: 1.005 (ref 1.005–1.030)
Urobilinogen, UA: 0.2 mg/dL (ref 0.0–1.0)

## 2014-05-14 LAB — PROTIME-INR
INR: 1.14 (ref 0.00–1.49)
Prothrombin Time: 14.7 seconds (ref 11.6–15.2)

## 2014-05-14 LAB — URINE MICROSCOPIC-ADD ON

## 2014-05-14 LAB — SURGICAL PCR SCREEN
MRSA, PCR: NEGATIVE
STAPHYLOCOCCUS AUREUS: NEGATIVE

## 2014-05-14 NOTE — Progress Notes (Signed)
Clearance note per Dr Clelia Croft on chart

## 2014-05-14 NOTE — Progress Notes (Signed)
Cbc results faxed to aluisio by epic

## 2014-05-14 NOTE — Progress Notes (Signed)
CXR per [facility] 03/02/2014 EKG in [facility] 10/19/2013

## 2014-05-17 NOTE — Progress Notes (Signed)
Fax received and placed on patient chart, no action per dr Lequita Halt on cbc results

## 2014-05-18 NOTE — Progress Notes (Signed)
U/A and micro results faxed via EPIC to Dr Aluisio.   

## 2014-05-20 ENCOUNTER — Ambulatory Visit: Payer: Self-pay | Admitting: Orthopedic Surgery

## 2014-05-20 NOTE — H&P (Signed)
Shelly [name] DOB: 1956-06-15 Married / Language: English / Race: White Female Date of Admission:  05-21-2014 Chief Complaint:  Right Knee Pain History of Present Illness The patient is a 58 year old female who comes in for a preoperative History and Physical. The patient is scheduled for a right unicompartmental replacement to be performed by Dr. Gus RankinFrank V. Aluisio, [facility] at [facility] on 05-21-2014. The patient is being followed for their right knee pain and osteoarthritis. Symptoms reported today include: pain, swelling and difficulty ambulating. The patient feels that they are doing poorly and report their pain level to be moderate. It has been a while since her last cortisone injection into the right knee. She has done okay with them in the past and would like to get another one today. She is ready to proceed with the right knee surgery. She has done well with the left knee uni replacement and now ready for the right side. They have been treated conservatively in the past for the above stated problem and despite conservative measures, they continue to have progressive pain and severe functional limitations and dysfunction. They have failed non-operative management including home exercise, medications, and injections. It is felt that they would benefit from undergoing total joint replacement. Risks and benefits of the procedure have been discussed with the patient and they elect to proceed with surgery. There are no active contraindications to surgery such as ongoing infection or rapidly progressive neurological disease.  Allergies Penicillins Childhood RXN - Unknown  Intolerance Sulfa Drugs Nausea. 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 father Chronic Obstructive Lung Disease father and grandfather fathers side Cerebrovascular Accident father and grandmother mothers side Diabetes Mellitus father, grandmother mothers side and grandmother fathers side  Social History Post-Surgical Plans Home Advance Directives Living Will, Healthcare POA Tobacco use Former smoker. 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 Advair Diskus (Inhalation) Specific dose unknown - Active. Albuterol Sulfate (Inhalation) Specific dose unknown - Active. Crestor (Oral) Specific dose unknown - Active. Omeprazole (Oral) Specific dose unknown - Active. Aleve (220MG  Tablet, Oral) Active. ALPRAZolam (0.5MG  Tablet, Oral) Active. Montelukast Sodium (10MG  Tablet, Oral) Active. Losartan Potassium (100MG  Tablet, Oral) Active. Lantus (100UNIT/ML Solution, Subcutaneous) Active.   Past Surgical History  Hysterectomy complete (non-cancerous) Tonsillectomy Colectomy partial Appendectomy Gallbladder  Surgery laporoscopic Left Knee Unicompartmental Replacement   Review of Systems  General Not Present- Chills, Fatigue, Fever, Memory Loss, Night Sweats, Weight Gain and Weight Loss. Skin Not Present- Eczema, Hives, Itching, Lesions and Rash. HEENT Not Present- Dentures, Double Vision, Headache, Hearing Loss, Tinnitus and Visual Loss. Respiratory Not Present- Allergies,  Chronic Cough, Coughing up blood, Shortness of breath at rest and Shortness of breath with exertion. Cardiovascular Not Present- Chest Pain, Difficulty Breathing Lying Down, Murmur, Palpitations, Racing/skipping heartbeats and Swelling. Gastrointestinal Not Present- Abdominal Pain, Bloody Stool, Constipation, Diarrhea, Difficulty Swallowing, Heartburn, Jaundice, Loss of appetitie, Nausea and Vomiting. Female Genitourinary Not Present- Blood in Urine, Discharge, Flank Pain, Incontinence, Painful Urination, Urgency, Urinary frequency, Urinary Retention, Urinating at Night and Weak urinary stream. Musculoskeletal Present- Joint Pain. Not Present- Back Pain, Joint Swelling, Morning Stiffness, Muscle Pain, Muscle Weakness and Spasms. Neurological Not Present- Blackout spells, Difficulty with balance, Dizziness, Paralysis, Tremor and Weakness. Psychiatric Not Present- Insomnia  Vitals Weight: 218 lb Height: 62in Weight was reported by patient. Height was reported by patient. Body Surface Area: 2.08 m Body Mass Index: 39.87 kg/m  BP: 164/96 (Sitting, Right Arm, Standard)   Physical Exam  General Mental Status -Alert, cooperative and good historian. General Appearance-pleasant, Not in acute distress. Orientation-Oriented X3. Build & Nutrition-Well nourished and Well developed.  Head and Neck Head-normocephalic, atraumatic . Neck Global Assessment - supple, no bruit auscultated on the right, no bruit auscultated on the left.  Eye Pupil - Bilateral-Regular and Round. Motion -  Bilateral-EOMI.  Chest and Lung Exam Auscultation Breath sounds - clear at anterior chest wall and clear at posterior chest wall. Adventitious sounds - No Adventitious sounds.  Cardiovascular Auscultation Rhythm - Regular rate and rhythm. Heart Sounds - S1 WNL and S2 WNL. Murmurs & Other Heart Sounds - Auscultation of the heart reveals - No Murmurs.  Abdomen Inspection Contour - Generalized mild distention. Palpation/Percussion Tenderness - Abdomen is non-tender to palpation. Rigidity (guarding) - Abdomen is soft. Auscultation Auscultation of the abdomen reveals - Bowel sounds normal.  Female Genitourinary Note: Not done, not pertinent to present illness   Musculoskeletal Note: The right knee shows no effusion. Range of motion is 0-135. Slight crepitus on range of motion. Tenderness noted on the medial joint line.   Assessment & Plan Primary osteoarthritis of right knee (M17.11) Note:Plan is for a Right Knee Unicompartmental Replacement by Dr. Lequita Halt.  Plan is to go home  PCP - Dr. Clelia Croft  The patient does not have any contraindications and will receive TXA (tranexamic acid) prior to surgery.  Signed electronically by Lauraine Rinne, III PA-C

## 2014-05-21 ENCOUNTER — Inpatient Hospital Stay (HOSPITAL_COMMUNITY)
Admission: RE | Admit: 2014-05-21 | Discharge: 2014-05-22 | DRG: 470 | Disposition: A | Discharge status: Home Health | Payer: 59 | Source: Ambulatory Visit | Attending: Orthopedic Surgery | Admitting: Orthopedic Surgery

## 2014-05-21 ENCOUNTER — Inpatient Hospital Stay (HOSPITAL_COMMUNITY): Payer: 59 | Admitting: Anesthesiology

## 2014-05-21 ENCOUNTER — Encounter (HOSPITAL_COMMUNITY): Payer: Self-pay | Admitting: *Deleted

## 2014-05-21 ENCOUNTER — Encounter (HOSPITAL_COMMUNITY)
Admission: RE | Disposition: A | Discharge status: Home Health | Payer: Self-pay | Source: Ambulatory Visit | Attending: Orthopedic Surgery

## 2014-05-21 DIAGNOSIS — E785 Hyperlipidemia, unspecified: Secondary | ICD-10-CM | POA: Diagnosis present

## 2014-05-21 DIAGNOSIS — E119 Type 2 diabetes mellitus without complications: Secondary | ICD-10-CM | POA: Diagnosis present

## 2014-05-21 DIAGNOSIS — E669 Obesity, unspecified: Secondary | ICD-10-CM | POA: Diagnosis present

## 2014-05-21 DIAGNOSIS — Z6841 Body Mass Index (BMI) 40.0 and over, adult: Secondary | ICD-10-CM | POA: Diagnosis not present

## 2014-05-21 DIAGNOSIS — M170 Bilateral primary osteoarthritis of knee: Secondary | ICD-10-CM | POA: Diagnosis present

## 2014-05-21 DIAGNOSIS — Z01812 Encounter for preprocedural laboratory examination: Secondary | ICD-10-CM | POA: Diagnosis not present

## 2014-05-21 DIAGNOSIS — M1710 Unilateral primary osteoarthritis, unspecified knee: Secondary | ICD-10-CM | POA: Diagnosis present

## 2014-05-21 DIAGNOSIS — F419 Anxiety disorder, unspecified: Secondary | ICD-10-CM | POA: Diagnosis present

## 2014-05-21 DIAGNOSIS — I10 Essential (primary) hypertension: Secondary | ICD-10-CM | POA: Diagnosis present

## 2014-05-21 DIAGNOSIS — M1711 Unilateral primary osteoarthritis, right knee: Secondary | ICD-10-CM

## 2014-05-21 DIAGNOSIS — K219 Gastro-esophageal reflux disease without esophagitis: Secondary | ICD-10-CM | POA: Diagnosis present

## 2014-05-21 DIAGNOSIS — M179 Osteoarthritis of knee, unspecified: Secondary | ICD-10-CM | POA: Diagnosis present

## 2014-05-21 DIAGNOSIS — M25561 Pain in right knee: Secondary | ICD-10-CM | POA: Diagnosis present

## 2014-05-21 HISTORY — PX: PARTIAL KNEE ARTHROPLASTY: SHX2174

## 2014-05-21 LAB — GLUCOSE, CAPILLARY
Glucose-Capillary: 172 mg/dL — ABNORMAL HIGH (ref 70–99)
Glucose-Capillary: 152 mg/dL — ABNORMAL HIGH (ref 70–99)
Glucose-Capillary: 219 mg/dL — ABNORMAL HIGH (ref 70–99)
Glucose-Capillary: 418 mg/dL — ABNORMAL HIGH (ref 70–99)
Glucose-Capillary: 367 mg/dL — ABNORMAL HIGH (ref 70–99)
Glucose-Capillary: 325 mg/dL — ABNORMAL HIGH (ref 70–99)

## 2014-05-21 LAB — GLUCOSE, RANDOM: Glucose, Bld: 405 mg/dL — ABNORMAL HIGH (ref 70–99)

## 2014-05-21 LAB — TYPE AND SCREEN
ABO/RH(D): O NEG
Antibody Screen: NEGATIVE

## 2014-05-21 SURGERY — ARTHROPLASTY, KNEE, UNICOMPARTMENTAL
Anesthesia: Spinal | Site: Knee | Laterality: Right

## 2014-05-21 MED ORDER — FENTANYL CITRATE 0.05 MG/ML IJ SOLN
INTRAMUSCULAR | Status: DC | PRN
  Administered 2014-05-21: 100 ug via INTRAVENOUS

## 2014-05-21 MED ORDER — MIDAZOLAM HCL 5 MG/5ML IJ SOLN
INTRAMUSCULAR | Status: DC | PRN
  Administered 2014-05-21: 2 mg via INTRAVENOUS

## 2014-05-21 MED ORDER — CEFAZOLIN SODIUM-DEXTROSE 2-3 GM-% IV SOLR
2.0000 g | INTRAVENOUS | Status: AC
  Administered 2014-05-21: 2 g via INTRAVENOUS

## 2014-05-21 MED ORDER — PHENOL 1.4 % MT LIQD
1.0000 | OROMUCOSAL | Status: DC | PRN
  Filled 2014-05-21: qty 177

## 2014-05-21 MED ORDER — CEFAZOLIN SODIUM-DEXTROSE 2-3 GM-% IV SOLR
2.0000 g | Freq: Four times a day (QID) | INTRAVENOUS | Status: AC
Start: 2014-05-21 — End: 2014-05-21
  Administered 2014-05-21 (×2): 2 g via INTRAVENOUS
  Filled 2014-05-21 (×2): qty 50

## 2014-05-21 MED ORDER — INSULIN GLARGINE 100 UNIT/ML ~~LOC~~ SOLN
30.0000 [IU] | Freq: Two times a day (BID) | SUBCUTANEOUS | Status: DC
  Administered 2014-05-22: 30 [IU] via SUBCUTANEOUS
  Filled 2014-05-21: qty 0.3

## 2014-05-21 MED ORDER — DOCUSATE SODIUM 100 MG PO CAPS
100.0000 mg | ORAL_CAPSULE | Freq: Two times a day (BID) | ORAL | Status: DC
  Administered 2014-05-21 – 2014-05-22 (×3): 100 mg via ORAL

## 2014-05-21 MED ORDER — ACETAMINOPHEN 10 MG/ML IV SOLN
1000.0000 mg | Freq: Once | INTRAVENOUS | Status: AC
  Administered 2014-05-21: 1000 mg via INTRAVENOUS
  Filled 2014-05-21: qty 100

## 2014-05-21 MED ORDER — LACTATED RINGERS IV SOLN
INTRAVENOUS | Status: DC | PRN
  Administered 2014-05-21 (×3): via INTRAVENOUS

## 2014-05-21 MED ORDER — RIVAROXABAN 10 MG PO TABS
10.0000 mg | ORAL_TABLET | Freq: Every day | ORAL | Status: DC
  Administered 2014-05-22: 10 mg via ORAL
  Filled 2014-05-21 (×2): qty 1

## 2014-05-21 MED ORDER — SODIUM CHLORIDE 0.9 % IV SOLN
INTRAVENOUS | Status: DC
  Administered 2014-05-21 – 2014-05-22 (×2): via INTRAVENOUS

## 2014-05-21 MED ORDER — SODIUM CHLORIDE 0.9 % IJ SOLN
INTRAMUSCULAR | Status: DC | PRN
  Administered 2014-05-21: 30 mL

## 2014-05-21 MED ORDER — DIPHENHYDRAMINE HCL 12.5 MG/5ML PO ELIX: 12.5000 mg | ORAL_SOLUTION | ORAL | Status: DC | PRN

## 2014-05-21 MED ORDER — BUPIVACAINE HCL (PF) 0.25 % IJ SOLN
INTRAMUSCULAR | Status: AC
  Filled 2014-05-21: qty 30

## 2014-05-21 MED ORDER — ACETAMINOPHEN 650 MG RE SUPP: 650.0000 mg | Freq: Four times a day (QID) | RECTAL | Status: DC | PRN

## 2014-05-21 MED ORDER — FENTANYL CITRATE 0.05 MG/ML IJ SOLN
INTRAMUSCULAR | Status: AC
  Filled 2014-05-21: qty 2

## 2014-05-21 MED ORDER — LORATADINE 10 MG PO TABS
10.0000 mg | ORAL_TABLET | Freq: Every day | ORAL | Status: DC
  Filled 2014-05-21: qty 1

## 2014-05-21 MED ORDER — ALBUTEROL SULFATE (2.5 MG/3ML) 0.083% IN NEBU: 3.0000 mL | INHALATION_SOLUTION | Freq: Four times a day (QID) | RESPIRATORY_TRACT | Status: DC | PRN

## 2014-05-21 MED ORDER — METHOCARBAMOL 500 MG PO TABS
500.0000 mg | ORAL_TABLET | Freq: Four times a day (QID) | ORAL | Status: DC | PRN
  Administered 2014-05-21 – 2014-05-22 (×2): 500 mg via ORAL
  Filled 2014-05-21 (×2): qty 1

## 2014-05-21 MED ORDER — BISACODYL 10 MG RE SUPP: 10.0000 mg | Freq: Every day | RECTAL | Status: DC | PRN

## 2014-05-21 MED ORDER — ACETAMINOPHEN 325 MG PO TABS
650.0000 mg | ORAL_TABLET | Freq: Four times a day (QID) | ORAL | Status: DC | PRN
Start: 2014-05-22 — End: 2014-05-22
  Filled 2014-05-21: qty 2

## 2014-05-21 MED ORDER — CHLORHEXIDINE GLUCONATE 4 % EX LIQD: 60.0000 mL | Freq: Once | CUTANEOUS | Status: DC

## 2014-05-21 MED ORDER — TRAMADOL HCL 50 MG PO TABS: 50.0000 mg | ORAL_TABLET | Freq: Four times a day (QID) | ORAL | Status: DC | PRN

## 2014-05-21 MED ORDER — KETOROLAC TROMETHAMINE 15 MG/ML IJ SOLN
7.5000 mg | Freq: Four times a day (QID) | INTRAMUSCULAR | Status: DC | PRN
  Administered 2014-05-21: 7.5 mg via INTRAVENOUS
  Filled 2014-05-21: qty 1

## 2014-05-21 MED ORDER — PROPOFOL INFUSION 10 MG/ML OPTIME
INTRAVENOUS | Status: DC | PRN
  Administered 2014-05-21: 80 ug/kg/min via INTRAVENOUS

## 2014-05-21 MED ORDER — PROPOFOL 10 MG/ML IV BOLUS
INTRAVENOUS | Status: AC
  Filled 2014-05-21: qty 20

## 2014-05-21 MED ORDER — DEXAMETHASONE SODIUM PHOSPHATE 10 MG/ML IJ SOLN
10.0000 mg | Freq: Once | INTRAMUSCULAR | Status: DC
  Filled 2014-05-21: qty 1

## 2014-05-21 MED ORDER — INSULIN ASPART 100 UNIT/ML ~~LOC~~ SOLN
0.0000 [IU] | Freq: Three times a day (TID) | SUBCUTANEOUS | Status: DC
  Administered 2014-05-21: 5 [IU] via SUBCUTANEOUS
  Administered 2014-05-21: 15 [IU] via SUBCUTANEOUS
  Administered 2014-05-22 (×2): 5 [IU] via SUBCUTANEOUS

## 2014-05-21 MED ORDER — MONTELUKAST SODIUM 10 MG PO TABS
10.0000 mg | ORAL_TABLET | Freq: Every day | ORAL | Status: DC
  Administered 2014-05-21: 10 mg via ORAL
  Filled 2014-05-21 (×2): qty 1

## 2014-05-21 MED ORDER — LORATADINE 10 MG PO TABS
10.0000 mg | ORAL_TABLET | ORAL | Status: DC
  Administered 2014-05-21: 10 mg via ORAL
  Filled 2014-05-21 (×2): qty 1

## 2014-05-21 MED ORDER — ONDANSETRON HCL 4 MG/2ML IJ SOLN
INTRAMUSCULAR | Status: DC | PRN
Start: 2014-05-21 — End: 2014-05-21
  Administered 2014-05-21: 4 mg via INTRAVENOUS

## 2014-05-21 MED ORDER — FENTANYL CITRATE 0.05 MG/ML IJ SOLN
25.0000 ug | INTRAMUSCULAR | Status: DC | PRN
  Administered 2014-05-21 (×2): 50 ug via INTRAVENOUS

## 2014-05-21 MED ORDER — SCOPOLAMINE 1 MG/3DAYS TD PT72
1.0000 | MEDICATED_PATCH | TRANSDERMAL | Status: DC
  Administered 2014-05-21: 1 via TRANSDERMAL
  Filled 2014-05-21: qty 1

## 2014-05-21 MED ORDER — 0.9 % SODIUM CHLORIDE (POUR BTL) OPTIME
TOPICAL | Status: DC | PRN
  Administered 2014-05-21: 1000 mL

## 2014-05-21 MED ORDER — METOCLOPRAMIDE HCL 5 MG/ML IJ SOLN: 5.0000 mg | Freq: Three times a day (TID) | INTRAMUSCULAR | Status: DC | PRN

## 2014-05-21 MED ORDER — ACETAMINOPHEN 500 MG PO TABS
1000.0000 mg | ORAL_TABLET | Freq: Four times a day (QID) | ORAL | Status: AC
  Administered 2014-05-21 – 2014-05-22 (×4): 1000 mg via ORAL
  Filled 2014-05-21 (×5): qty 2

## 2014-05-21 MED ORDER — BUPIVACAINE HCL (PF) 0.25 % IJ SOLN
INTRAMUSCULAR | Status: DC | PRN
  Administered 2014-05-21: 20 mL

## 2014-05-21 MED ORDER — BUPIVACAINE HCL (PF) 0.75 % IJ SOLN
INTRAMUSCULAR | Status: DC | PRN
  Administered 2014-05-21: 15 mg

## 2014-05-21 MED ORDER — ALPRAZOLAM 0.5 MG PO TABS
0.5000 mg | ORAL_TABLET | Freq: Three times a day (TID) | ORAL | Status: DC | PRN
  Administered 2014-05-21: 0.5 mg via ORAL
  Filled 2014-05-21: qty 1

## 2014-05-21 MED ORDER — FLEET ENEMA 7-19 GM/118ML RE ENEM: 1.0000 | ENEMA | Freq: Once | RECTAL | Status: AC | PRN

## 2014-05-21 MED ORDER — METHOCARBAMOL 1000 MG/10ML IJ SOLN
500.0000 mg | Freq: Four times a day (QID) | INTRAVENOUS | Status: DC | PRN
  Administered 2014-05-21: 500 mg via INTRAVENOUS
  Filled 2014-05-21 (×2): qty 5

## 2014-05-21 MED ORDER — ONDANSETRON HCL 4 MG PO TABS: 4.0000 mg | ORAL_TABLET | Freq: Four times a day (QID) | ORAL | Status: DC | PRN

## 2014-05-21 MED ORDER — SCOPOLAMINE 1 MG/3DAYS TD PT72
MEDICATED_PATCH | TRANSDERMAL | Status: AC
  Filled 2014-05-21: qty 1

## 2014-05-21 MED ORDER — BUPIVACAINE LIPOSOME 1.3 % IJ SUSP
20.0000 mL | Freq: Once | INTRAMUSCULAR | Status: DC
  Filled 2014-05-21: qty 20

## 2014-05-21 MED ORDER — MORPHINE SULFATE 2 MG/ML IJ SOLN
1.0000 mg | INTRAMUSCULAR | Status: DC | PRN
  Administered 2014-05-21 – 2014-05-22 (×4): 2 mg via INTRAVENOUS
  Filled 2014-05-21 (×4): qty 1

## 2014-05-21 MED ORDER — MEPERIDINE HCL 50 MG/ML IJ SOLN: 6.2500 mg | INTRAMUSCULAR | Status: DC | PRN

## 2014-05-21 MED ORDER — OMEPRAZOLE MAGNESIUM 20 MG PO TBEC: 20.0000 mg | DELAYED_RELEASE_TABLET | Freq: Every day | ORAL | Status: DC

## 2014-05-21 MED ORDER — LOSARTAN POTASSIUM 50 MG PO TABS
100.0000 mg | ORAL_TABLET | Freq: Every morning | ORAL | Status: DC
  Administered 2014-05-21 – 2014-05-22 (×2): 100 mg via ORAL
  Filled 2014-05-21 (×2): qty 2

## 2014-05-21 MED ORDER — METOCLOPRAMIDE HCL 10 MG PO TABS: 5.0000 mg | ORAL_TABLET | Freq: Three times a day (TID) | ORAL | Status: DC | PRN

## 2014-05-21 MED ORDER — CEFAZOLIN SODIUM-DEXTROSE 2-3 GM-% IV SOLR
INTRAVENOUS | Status: AC
  Filled 2014-05-21: qty 50

## 2014-05-21 MED ORDER — SODIUM CHLORIDE 0.9 % IV SOLN: INTRAVENOUS | Status: DC

## 2014-05-21 MED ORDER — OXYCODONE HCL 5 MG PO TABS
5.0000 mg | ORAL_TABLET | ORAL | Status: DC | PRN
  Administered 2014-05-21 – 2014-05-22 (×8): 10 mg via ORAL
  Filled 2014-05-21 (×8): qty 2

## 2014-05-21 MED ORDER — MENTHOL 3 MG MT LOZG
1.0000 | LOZENGE | OROMUCOSAL | Status: DC | PRN
  Filled 2014-05-21: qty 9

## 2014-05-21 MED ORDER — POLYETHYLENE GLYCOL 3350 17 G PO PACK
17.0000 g | PACK | Freq: Every day | ORAL | Status: DC | PRN
Start: 2014-05-21 — End: 2014-05-22

## 2014-05-21 MED ORDER — MIDAZOLAM HCL 2 MG/2ML IJ SOLN
INTRAMUSCULAR | Status: AC
  Filled 2014-05-21: qty 2

## 2014-05-21 MED ORDER — SODIUM CHLORIDE 0.9 % IJ SOLN
INTRAMUSCULAR | Status: AC
  Filled 2014-05-21: qty 50

## 2014-05-21 MED ORDER — BUPIVACAINE LIPOSOME 1.3 % IJ SUSP
INTRAMUSCULAR | Status: DC | PRN
  Administered 2014-05-21: 20 mL

## 2014-05-21 MED ORDER — SODIUM CHLORIDE 0.9 % IR SOLN
Status: DC | PRN
  Administered 2014-05-21: 1000 mL

## 2014-05-21 MED ORDER — DEXAMETHASONE SODIUM PHOSPHATE 10 MG/ML IJ SOLN
10.0000 mg | Freq: Once | INTRAMUSCULAR | Status: AC
  Administered 2014-05-21: 10 mg via INTRAVENOUS

## 2014-05-21 MED ORDER — PROMETHAZINE HCL 25 MG/ML IJ SOLN: 6.2500 mg | INTRAMUSCULAR | Status: DC | PRN

## 2014-05-21 MED ORDER — PANTOPRAZOLE SODIUM 40 MG PO TBEC
40.0000 mg | DELAYED_RELEASE_TABLET | Freq: Every day | ORAL | Status: DC
  Administered 2014-05-21 – 2014-05-22 (×2): 40 mg via ORAL
  Filled 2014-05-21 (×2): qty 1

## 2014-05-21 MED ORDER — TRANEXAMIC ACID 100 MG/ML IV SOLN
1000.0000 mg | INTRAVENOUS | Status: AC
  Administered 2014-05-21: 1000 mg via INTRAVENOUS
  Filled 2014-05-21: qty 10

## 2014-05-21 MED ORDER — ONDANSETRON HCL 4 MG/2ML IJ SOLN: 4.0000 mg | Freq: Four times a day (QID) | INTRAMUSCULAR | Status: DC | PRN

## 2014-05-21 SURGICAL SUPPLY — 56 items
BAG ZIPLOCK 12X15 (MISCELLANEOUS) IMPLANT
BANDAGE ELASTIC 6 VELCRO ST LF (GAUZE/BANDAGES/DRESSINGS) ×2 IMPLANT
BANDAGE ESMARK 6X9 LF (GAUZE/BANDAGES/DRESSINGS) ×1 IMPLANT
BLADE SAW RECIPROCATING 77.5 (BLADE) ×2 IMPLANT
BLADE SAW SGTL 13.0X1.19X90.0M (BLADE) ×2 IMPLANT
BNDG ESMARK 6X9 LF (GAUZE/BANDAGES/DRESSINGS) ×2
BOWL SMART MIX CTS (DISPOSABLE) ×2 IMPLANT
BUR OVAL CARBIDE 4.0 (BURR) ×2 IMPLANT
CAPT KNEE PARTIAL 2 ×1 IMPLANT
CEMENT HV SMART SET (Cement) ×2 IMPLANT
CUFF TOURN SGL QUICK 34 (TOURNIQUET CUFF) ×1
CUFF TRNQT CYL 34X4X40X1 (TOURNIQUET CUFF) ×1 IMPLANT
DRAPE EXTREMITY TIBURON (DRAPES) ×2 IMPLANT
DRAPE POUCH INSTRU U-SHP 10X18 (DRAPES) ×2 IMPLANT
DRSG ADAPTIC 3X8 NADH LF (GAUZE/BANDAGES/DRESSINGS) ×2 IMPLANT
DRSG PAD ABDOMINAL 8X10 ST (GAUZE/BANDAGES/DRESSINGS) ×2 IMPLANT
DURAPREP 26ML APPLICATOR (WOUND CARE) ×2 IMPLANT
ELECT REM PT RETURN 9FT ADLT (ELECTROSURGICAL) ×2
ELECTRODE REM PT RTRN 9FT ADLT (ELECTROSURGICAL) ×1 IMPLANT
EVACUATOR 1/8 PVC DRAIN (DRAIN) ×2 IMPLANT
FACESHIELD WRAPAROUND (MASK) ×8 IMPLANT
GAUZE SPONGE 4X4 12PLY STRL (GAUZE/BANDAGES/DRESSINGS) ×2 IMPLANT
GLOVE BIO SURGEON STRL SZ7.5 (GLOVE) ×2 IMPLANT
GLOVE BIO SURGEON STRL SZ8 (GLOVE) ×2 IMPLANT
GLOVE BIOGEL PI IND STRL 7.5 (GLOVE) ×1 IMPLANT
GLOVE BIOGEL PI IND STRL 8 (GLOVE) ×3 IMPLANT
GLOVE BIOGEL PI INDICATOR 7.5 (GLOVE) ×1
GLOVE BIOGEL PI INDICATOR 8 (GLOVE) ×3
GLOVE ECLIPSE 7.5 STRL STRAW (GLOVE) ×2 IMPLANT
GLOVE SURG SS PI 7.5 STRL IVOR (GLOVE) ×2 IMPLANT
GOWN BRE IMP PREV XXLGXLNG (GOWN DISPOSABLE) ×2 IMPLANT
GOWN SPEC L3 XXLG W/TWL (GOWN DISPOSABLE) ×2 IMPLANT
GOWN STRL REUS W/TWL LRG LVL3 (GOWN DISPOSABLE) ×4 IMPLANT
HANDPIECE INTERPULSE COAX TIP (DISPOSABLE) ×1
IMMOBILIZER KNEE 20 (SOFTGOODS) ×2
IMMOBILIZER KNEE 20 THIGH 36 (SOFTGOODS) ×1 IMPLANT
KIT BASIN OR (CUSTOM PROCEDURE TRAY) ×2 IMPLANT
KIT IMPL STRL TIB IPOLY IUNI ×1 IMPLANT
MANIFOLD NEPTUNE II (INSTRUMENTS) ×2 IMPLANT
NDL SAFETY ECLIPSE 18X1.5 (NEEDLE) ×2 IMPLANT
NEEDLE HYPO 18GX1.5 SHARP (NEEDLE) ×2
PACK TOTAL JOINT (CUSTOM PROCEDURE TRAY) ×2 IMPLANT
PADDING CAST COTTON 6X4 STRL (CAST SUPPLIES) ×6 IMPLANT
POSITIONER SURGICAL ARM (MISCELLANEOUS) ×2 IMPLANT
SET HNDPC FAN SPRY TIP SCT (DISPOSABLE) ×1 IMPLANT
STRIP CLOSURE SKIN 1/2X4 (GAUZE/BANDAGES/DRESSINGS) ×4 IMPLANT
SUCTION FRAZIER 12FR DISP (SUCTIONS) ×2 IMPLANT
SUT MNCRL AB 4-0 PS2 18 (SUTURE) ×2 IMPLANT
SUT VIC AB 2-0 CT1 27 (SUTURE) ×3
SUT VIC AB 2-0 CT1 TAPERPNT 27 (SUTURE) ×3 IMPLANT
SUT VLOC 180 0 24IN GS25 (SUTURE) ×2 IMPLANT
SYR 20CC LL (SYRINGE) ×2 IMPLANT
SYR 50ML LL SCALE MARK (SYRINGE) ×2 IMPLANT
TOWEL OR 17X26 10 PK STRL BLUE (TOWEL DISPOSABLE) ×2 IMPLANT
TRAY FOLEY CATH 14FRSI W/METER (CATHETERS) ×2 IMPLANT
WRAP KNEE MAXI GEL POST OP (GAUZE/BANDAGES/DRESSINGS) ×2 IMPLANT

## 2014-05-21 NOTE — Progress Notes (Signed)
Pt's PM blood sugar was 418. Blood sugar was taken after the patient had eaten and pt informed nurse that she had eaten twice since she was covered for lunch. On call paged for orders and stat lab verification was ordered. On call gave orders to give patient 15 units of novolog and recheck in 30 minutes. Will continue to monitor.

## 2014-05-21 NOTE — H&P (View-Only) (Signed)
Shelly [name] DOB: 01/04/1956 Married / Language: English / Race: White Female Date of Admission:  05-21-2014 Chief Complaint:  Right Knee Pain History of Present Illness The patient is a 58 year old female who comes in for a preoperative History and Physical. The patient is scheduled for a right unicompartmental replacement to be performed by Dr. Frank V. Aluisio, MD at [facility] on 05-21-2014. The patient is being followed for their right knee pain and osteoarthritis. Symptoms reported today include: pain, swelling and difficulty ambulating. The patient feels that they are doing poorly and report their pain level to be moderate. It has been a while since her last cortisone injection into the right knee. She has done okay with them in the past and would like to get another one today. She is ready to proceed with the right knee surgery. She has done well with the left knee uni replacement and now ready for the right side. They have been treated conservatively in the past for the above stated problem and despite conservative measures, they continue to have progressive pain and severe functional limitations and dysfunction. They have failed non-operative management including home exercise, medications, and injections. It is felt that they would benefit from undergoing total joint replacement. Risks and benefits of the procedure have been discussed with the patient and they elect to proceed with surgery. There are no active contraindications to surgery such as ongoing infection or rapidly progressive neurological disease.  Allergies Penicillins Childhood RXN - Unknown  Intolerance Sulfa Drugs Nausea. 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 father Chronic Obstructive Lung Disease father and grandfather fathers side Cerebrovascular Accident father and grandmother mothers side Diabetes Mellitus father, grandmother mothers side and grandmother fathers side  Social History Post-Surgical Plans Home Advance Directives Living Will, Healthcare POA Tobacco use Former smoker. 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 Advair Diskus (Inhalation) Specific dose unknown - Active. Albuterol Sulfate (Inhalation) Specific dose unknown - Active. Crestor (Oral) Specific dose unknown - Active. Omeprazole (Oral) Specific dose unknown - Active. Aleve (220MG Tablet, Oral) Active. ALPRAZolam (0.5MG Tablet, Oral) Active. Montelukast Sodium (10MG Tablet, Oral) Active. Losartan Potassium (100MG Tablet, Oral) Active. Lantus (100UNIT/ML Solution, Subcutaneous) Active.   Past Surgical History  Hysterectomy complete (non-cancerous) Tonsillectomy Colectomy partial Appendectomy Gallbladder  Surgery laporoscopic Left Knee Unicompartmental Replacement   Review of Systems  General Not Present- Chills, Fatigue, Fever, Memory Loss, Night Sweats, Weight Gain and Weight Loss. Skin Not Present- Eczema, Hives, Itching, Lesions and Rash. HEENT Not Present- Dentures, Double Vision, Headache, Hearing Loss, Tinnitus and Visual Loss. Respiratory Not Present- Allergies,   Chronic Cough, Coughing up blood, Shortness of breath at rest and Shortness of breath with exertion. Cardiovascular Not Present- Chest Pain, Difficulty Breathing Lying Down, Murmur, Palpitations, Racing/skipping heartbeats and Swelling. Gastrointestinal Not Present- Abdominal Pain, Bloody Stool, Constipation, Diarrhea, Difficulty Swallowing, Heartburn, Jaundice, Loss of appetitie, Nausea and Vomiting. Female Genitourinary Not Present- Blood in Urine, Discharge, Flank Pain, Incontinence, Painful Urination, Urgency, Urinary frequency, Urinary Retention, Urinating at Night and Weak urinary stream. Musculoskeletal Present- Joint Pain. Not Present- Back Pain, Joint Swelling, Morning Stiffness, Muscle Pain, Muscle Weakness and Spasms. Neurological Not Present- Blackout spells, Difficulty with balance, Dizziness, Paralysis, Tremor and Weakness. Psychiatric Not Present- Insomnia  Vitals Weight: 218 lb Height: 62in Weight was reported by patient. Height was reported by patient. Body Surface Area: 2.08 m Body Mass Index: 39.87 kg/m  BP: 164/96 (Sitting, Right Arm, Standard)   Physical Exam  General Mental Status -Alert, cooperative and good historian. General Appearance-pleasant, Not in acute distress. Orientation-Oriented X3. Build & Nutrition-Well nourished and Well developed.  Head and Neck Head-normocephalic, atraumatic . Neck Global Assessment - supple, no bruit auscultated on the right, no bruit auscultated on the left.  Eye Pupil - Bilateral-Regular and Round. Motion -  Bilateral-EOMI.  Chest and Lung Exam Auscultation Breath sounds - clear at anterior chest wall and clear at posterior chest wall. Adventitious sounds - No Adventitious sounds.  Cardiovascular Auscultation Rhythm - Regular rate and rhythm. Heart Sounds - S1 WNL and S2 WNL. Murmurs & Other Heart Sounds - Auscultation of the heart reveals - No Murmurs.  Abdomen Inspection Contour - Generalized mild distention. Palpation/Percussion Tenderness - Abdomen is non-tender to palpation. Rigidity (guarding) - Abdomen is soft. Auscultation Auscultation of the abdomen reveals - Bowel sounds normal.  Female Genitourinary Note: Not done, not pertinent to present illness   Musculoskeletal Note: The right knee shows no effusion. Range of motion is 0-135. Slight crepitus on range of motion. Tenderness noted on the medial joint line.   Assessment & Plan Primary osteoarthritis of right knee (M17.11) Note:Plan is for a Right Knee Unicompartmental Replacement by Dr. Lequita Halt.  Plan is to go home  PCP - Dr. Clelia Croft  The patient does not have any contraindications and will receive TXA (tranexamic acid) prior to surgery.  Signed electronically by Lauraine Rinne, III PA-C

## 2014-05-21 NOTE — Interval H&P Note (Signed)
History and Physical Interval Note:  05/21/2014 7:15 AM  Shelly [name]  has presented today for surgery, with the diagnosis of right knee osteoarthritis  The various methods of treatment have been discussed with the patient and family. After consideration of risks, benefits and other options for treatment, the patient has consented to  Procedure(s): MEDIAL UNICOMPARTMENTAL RIGHT KNEE ARTHROPLASTY (Right) as a surgical intervention .  The patient's history has been reviewed, patient examined, no change in status, stable for surgery.  I have reviewed the patient's chart and labs.  Questions were answered to the patient's satisfaction.     Loanne Drilling

## 2014-05-21 NOTE — Anesthesia Preprocedure Evaluation (Addendum)
Anesthesia Evaluation  Patient identified by MRN, date of birth, ID band Patient awake    Reviewed: Allergy & Precautions, H&P , NPO status , Patient's Chart, lab work & pertinent test results  History of Anesthesia Complications (+) PONV  Airway Mallampati: II       Dental  (+) Teeth Intact   Pulmonary asthma ,  inhalers breath sounds clear to auscultation        Cardiovascular hypertension, Pt. on medications Rhythm:Regular     Neuro/Psych Anxiety    GI/Hepatic GERD-  ,  Endo/Other  diabetes, Type 2  Renal/GU      Musculoskeletal   Abdominal (+) + obese,   Peds  Hematology   Anesthesia Other Findings   Reproductive/Obstetrics                            Anesthesia Physical Anesthesia Plan  ASA: III  Anesthesia Plan: General/Spinal   Post-op Pain Management:    Induction: Intravenous  Airway Management Planned:   Additional Equipment:   Intra-op Plan:   Post-operative Plan: Extubation in OR  Informed Consent: I have reviewed the patients History and Physical, chart, labs and discussed the procedure including the risks, benefits and alternatives for the proposed anesthesia with the patient or authorized representative who has indicated his/her understanding and acceptance.     Plan Discussed with:   Anesthesia Plan Comments: (PTT was elevated, PT, INR OK, PT. Had been taking high dose ibuprofen, off of it now for 7 days, spinal OK)       Anesthesia Quick Evaluation

## 2014-05-21 NOTE — Anesthesia Postprocedure Evaluation (Signed)
  Anesthesia Post-op Note  Patient: Shelly [name]  Procedure(s) Performed: Procedure(s): MEDIAL UNICOMPARTMENTAL RIGHT KNEE ARTHROPLASTY (Right)  Patient Location: PACU  Anesthesia Type:General  Level of Consciousness: awake and alert   Airway and Oxygen Therapy: Patient Spontanous Breathing  Post-op Pain: mild  Post-op Assessment: Post-op Vital signs reviewed, Respiratory Function Stable, Patent Airway and No signs of Nausea or vomiting  Post-op Vital Signs: Reviewed and stable  Last Vitals:  Filed Vitals:   05/21/14 1211  BP: 150/69  Pulse: 67  Temp: 36.6 C  Resp: 14    Complications: No apparent anesthesia complications

## 2014-05-21 NOTE — Transfer of Care (Signed)
Immediate Anesthesia Transfer of Care Note  Patient: Shelly [name]  Procedure(s) Performed: Procedure(s): MEDIAL UNICOMPARTMENTAL RIGHT KNEE ARTHROPLASTY (Right)  Patient Location: PACU  Anesthesia Type:Spinal  Level of Consciousness: sedated  Airway & Oxygen Therapy: Patient Spontanous Breathing and Patient connected to face mask oxygen  Post-op Assessment: Report given to PACU RN and Post -op Vital signs reviewed and stable  Post vital signs: Reviewed and stable  Complications: No apparent anesthesia complications

## 2014-05-21 NOTE — Anesthesia Procedure Notes (Signed)
Spinal Patient location during procedure: OR Start time: 05/21/2014 9:25 AM End time: 05/21/2014 9:28 AM Staffing Resident/CRNA: Carmelia Roller R Performed by: resident/CRNA  Preanesthetic Checklist Completed: patient identified, site marked, surgical consent, pre-op evaluation, timeout performed, IV checked, risks and benefits discussed and monitors and equipment checked Spinal Block Patient position: sitting Prep: Betadine Patient monitoring: heart rate, cardiac monitor, continuous pulse ox and blood pressure Approach: midline Location: L3-4 Injection technique: single-shot Needle Needle type: Sprotte  Needle gauge: 24 G Needle length: 9 cm Needle insertion depth: 7 cm Assessment Sensory level: T6

## 2014-05-21 NOTE — Op Note (Signed)
OPERATIVE REPORT  PREOPERATIVE DIAGNOSIS: Medial compartment osteoarthritis, Right knee  POSTOPERATIVE DIAGNOSIS: Medial compartment osteoarthritis, Right knee  PROCEDURE:Right knee medial unicompartmental arthroplasty.   SURGEON: Ollen Gross, MD   ASSISTANT: Dimitri Ped, PA-C  ANESTHESIA:  Spinal.   ESTIMATED BLOOD LOSS: Minimal.   DRAINS: Hemovac x1.   TOURNIQUET TIME: 30 minutes at 300 mmHg.   COMPLICATIONS: None.   CONDITION: Stable to recovery.   BRIEF CLINICAL NOTE: Shelly [name] is a 58 y.o. female, who has  significant isolated medial compartment arthritis of the Right knee. She has had nonoperative management including injections. She has had  cortisone and viscous supplements. Unfortunately, the pain persists.  Radiograph showed isolated medial compartment bone-on-bone arthritis  with normal-appearing patellofemoral and lateral compartments. She  presents now for left knee unicompartmental arthroplasty.   PROCEDURE IN DETAIL: After successful administration of  Spinal anesthetic, a tourniquet was placed high on the right thigh and right lower extremity prepped and draped in usual sterile fashion. Extremity  was wrapped in an Esmarch, knee flexed, and tourniquet inflated to 300  mmHg. A midline incision was made with a 10 blade through subcutaneous  tissue to the extensor mechanism. A fresh blade was used to make a  medial parapatellar arthrotomy. Soft tissue on the proximal medial  tibia subperiosteally elevated to the joint line with a knife and into  the semimembranosus bursa with a Cobb elevator. The patella was  subluxed laterally, and the knee flexed 90 degrees. The ACL was intact.  The marginal osteophytes on the medial femur and tibia were removed with  a rongeur. The medial meniscus was also removed. The femoral cutting  block for the Conformis unicompartmental knee system was placed along  the femur. There was excellent fit. I  traced the outline. We then  removed any remaining cartilage within this outline. We then placed the  cutting block again and pinned in position. The posterior femoral cut  was made, it was approximately 5 mm. The lug holes for the femoral  component were then drilled through the cutting block. The cutting  block was subsequently removed. We then utilized the high speed bur to  create a small trough at the superior aspect of the components that of  the inset and would not overhang the cartilage. The trial was placed,  it had excellent fit. The trial was subsequently removed.       The trial was placed again and the B chip was placed. There was an  excellent balance throughout full motion. Also with excellent fit on  her tibia. This was removed as was the femoral trial. A curette was  used to remove any remaining cartilage from the tibia. The tibial  cutting block was then placed and there was a perfect fit on the tibial  surface. The appropriate slope was placed and it was pinned in  position. The reciprocating saw was used to make the central cut and  then the oscillating saw used to make the horizontal cut. The bone  fragment was then removed. The tibial trial was placed and had perfect  fit on the tibia. We then drilled the 2 lug holes and did the keel punch.  We then placed tibia trial femur, and a 6 mm trial insert. There was  excellent stability throughout full range of motion and no impingement.  The trial was then removed. We used small drill holes for the distal  femur in order to create more conduits for the cement. The cut bone  surfaces were thoroughly irrigated with pulsatile lavage where the  cement was mixed on the back table. We then cemented the tibial  component into place, impacted it and removed the extruded cement. The  same was done for the femoral component. Trial 6-mm inserts placed,  knee held in full extension, and all extruded cement removed. While the  cement  was hardening, I injected the extensor mechanism, periosteum of  the femur and subcu tissues, a total of 20 mL of Exparel mixed with 30  mL of saline and then did an additional injection of 20 mL of 0.25%  Marcaine into the same tissues. When the cement had fully hardened,  then the permanent polyethylene was placed in tibial tray. There was  excellent stability throughout full range of motion with no lift off the  component and no evidence of any impingement. Wound was copiously  irrigated with saline solution, and the arthrotomy closed over a Hemovac  drain with a running #1 V-Loc suture. The subcutaneous was closed with  interrupted 2-0 Vicryl and subcuticular running 4-0 Monocryl. The drain  was hooked to suction. Incision cleaned and dried and Steri-Strips and  a bulky sterile dressing applied. The tourniquet was released after a  total time of 33 minutes. This was done after closing the extensor  mechanism. The wound was closed and a bulky sterile dressing was  applied. She was placed into a knee immobilizer, awakened and  transported to recovery room in stable condition.  Please note that a surgical assistant was a medical necessity for this  procedure in order to perform it in a safe and expeditious manner.  Assistance was necessary for retracting vital ligaments, neurovascular  structures, as well as for proper positioning of the limb to allow for  appropriate bone cuts and appropriate placement of the prosthesis.    Shelly RankinFrank V. Cay Kath, MD

## 2014-05-21 NOTE — Evaluation (Signed)
Physical Therapy Evaluation Patient Details Name: Shelly [name] MRN: [id number] DOB: [birth year] Today's Date: 05/21/2014   History of Present Illness  R UKR, h/o L UKR 4/15  Clinical Impression  Patient ambulated x 50', doing very well. Pt will benefit from PT to address problems listed in note below.    Follow Up Recommendations Home health PT;Supervision/Assistance - 24 hour    Equipment Recommendations  None recommended by PT    Recommendations for Other Services       Precautions / Restrictions Precautions Precautions: Knee Required Braces or Orthoses: Knee Immobilizer - Right Knee Immobilizer - Right: Discontinue once straight leg raise with < 10 degree lag      Mobility  Bed Mobility Overal bed mobility: Needs Assistance Bed Mobility: Supine to Sit     Supine to sit: Min assist     General bed mobility comments: cues for  technique  Transfers Overall transfer level: Needs assistance Equipment used: Rolling walker (2 wheeled) Transfers: Sit to/from Stand Sit to Stand: Min assist         General transfer comment: cues for hand palcement and R leg position  Ambulation/Gait Ambulation/Gait assistance: Min assist Ambulation Distance (Feet): 50 Feet Assistive device: Rolling walker (2 wheeled) Gait Pattern/deviations: Step-to pattern;Decreased stance time - right     General Gait Details: cues for sequence  Stairs            Wheelchair Mobility    Modified Rankin (Stroke Patients Only)       Balance                                             Pertinent Vitals/Pain Pain Assessment: 0-10 Pain Score: 5  Pain Descriptors / Indicators: Discomfort Pain Intervention(s): Limited activity within patient's tolerance;Premedicated before session;Ice applied    Home Living Family/patient expects to be discharged to:: Private residence Living Arrangements: Children;Other relatives Available Help at Discharge:  Family;Friend(s);Available PRN/intermittently Type of Home: House Home Access: Stairs to enter   [employer] of Steps: 1 Home Layout: One level Home Equipment: Walker - 2 wheels      Prior Function Level of Independence: Independent               Hand Dominance        Extremity/Trunk Assessment               Lower Extremity Assessment: RLE deficits/detail RLE Deficits / Details: able to perform SLR with lag.       Communication   Communication: No difficulties  Cognition Arousal/Alertness: Awake/alert Behavior During Therapy: WFL for tasks assessed/performed Overall Cognitive Status: Within Functional Limits for tasks assessed                      General Comments      Exercises        Assessment/Plan    PT Assessment Patient needs continued PT services  PT Diagnosis Difficulty walking;Acute pain   PT Problem List Decreased strength;Decreased range of motion;Decreased activity tolerance;Decreased mobility;Decreased knowledge of precautions;Decreased knowledge of use of DME;Pain  PT Treatment Interventions DME instruction;Gait training;Stair training;Functional mobility training;Therapeutic activities;Therapeutic exercise;Patient/family education   PT Goals (Current goals can be found in the Care Plan section) Acute Rehab PT Goals Patient Stated Goal: to walk and get back to work PT Goal Formulation: With patient Time For Goal Achievement:  05/25/14 Potential to Achieve Goals: Good    Frequency 7X/week   Barriers to discharge        Co-evaluation               End of Session Equipment Utilized During Treatment: Right knee immobilizer Activity Tolerance: Patient tolerated treatment well Patient left: in chair;with call bell/phone within reach;with family/visitor present Nurse Communication: Mobility status         Time: 1610-9604 PT Time Calculation (min): 21 min   Charges:   PT Evaluation $Initial PT  Evaluation Tier I: 1 Procedure PT Treatments $Gait Training: 8-22 mins   PT G Codes:          Rada Hay 05/21/2014, 4:33 PM

## 2014-05-21 NOTE — Interval H&P Note (Signed)
History and Physical Interval Note:  05/21/2014 7:16 AM  Shelly [name] A Zundel  has presented today for surgery, with the diagnosis of right knee osteoarthritis  The various methods of treatment have been discussed with the patient and family. After consideration of risks, benefits and other options for treatment, the patient has consented to  Procedure(s): MEDIAL UNICOMPARTMENTAL RIGHT KNEE ARTHROPLASTY (Right) as a surgical intervention .  The patient's history has been reviewed, patient examined, no change in status, stable for surgery.  I have reviewed the patient's chart and labs.  Questions were answered to the patient's satisfaction.     Loanne Drilling

## 2014-05-21 NOTE — Progress Notes (Signed)
Utilization review completed.  

## 2014-05-22 LAB — GLUCOSE, CAPILLARY
GLUCOSE-CAPILLARY: 210 mg/dL — AB (ref 70–99)
Glucose-Capillary: 221 mg/dL — ABNORMAL HIGH (ref 70–99)

## 2014-05-22 LAB — BASIC METABOLIC PANEL
ANION GAP: 11 (ref 5–15)
BUN: 16 mg/dL (ref 6–23)
CALCIUM: 8.7 mg/dL (ref 8.4–10.5)
CO2: 24 meq/L (ref 19–32)
Chloride: 99 mEq/L (ref 96–112)
Creatinine, Ser: 1.37 mg/dL — ABNORMAL HIGH (ref 0.50–1.10)
GFR calc Af Amer: 48 mL/min — ABNORMAL LOW (ref >90)
GFR calc non Af Amer: 42 mL/min — ABNORMAL LOW (ref >90)
Glucose, Bld: 289 mg/dL — ABNORMAL HIGH (ref 70–99)
POTASSIUM: 5 meq/L (ref 3.7–5.3)
Sodium: 134 mEq/L — ABNORMAL LOW (ref 137–147)

## 2014-05-22 LAB — CBC
HCT: 28.8 % — ABNORMAL LOW (ref 36.0–46.0)
Hemoglobin: 9.5 g/dL — ABNORMAL LOW (ref 12.0–15.0)
MCH: 28.5 pg (ref 26.0–34.0)
MCHC: 33 g/dL (ref 30.0–36.0)
MCV: 86.5 fL (ref 78.0–100.0)
PLATELETS: 277 10*3/uL (ref 150–400)
RBC: 3.33 MIL/uL — AB (ref 3.87–5.11)
RDW: 13.5 % (ref 11.5–15.5)
WBC: 14.4 10*3/uL — AB (ref 4.0–10.5)

## 2014-05-22 MED ORDER — OXYCODONE HCL 5 MG PO TABS: 5.0000 mg | ORAL_TABLET | ORAL | Status: DC | PRN

## 2014-05-22 MED ORDER — RIVAROXABAN 10 MG PO TABS: 10.0000 mg | ORAL_TABLET | Freq: Every day | ORAL | Status: DC

## 2014-05-22 MED ORDER — METHOCARBAMOL 500 MG PO TABS
500.0000 mg | ORAL_TABLET | Freq: Four times a day (QID) | ORAL | Status: DC | PRN
Start: 2014-05-22 — End: 2014-07-17

## 2014-05-22 MED ORDER — TRAMADOL HCL 50 MG PO TABS: 50.0000 mg | ORAL_TABLET | Freq: Four times a day (QID) | ORAL | Status: DC | PRN

## 2014-05-22 NOTE — Discharge Summary (Signed)
Physician Discharge Summary   Patient ID: Shelly [name] MRN: [id number] DOB/AGE: 58-30-1957 58 y.o.  Admit date: 05/21/2014 Discharge date: 05-22-2014  Primary Diagnosis:  Medial compartment osteoarthritis, Right knee Admission Diagnoses:  Past Medical History  Diagnosis Date  . Diabetes mellitus   . Hypertension   . Hyperlipemia   . Acid reflux   . Anxiety   . Obesity   . Asthma     infrequent problems  . Arthritis   . Rash     sides of neck  . Stress incontinence   . Complication of anesthesia     "I quit breathing during surgery" 2008 - has had surgery since with no problems   . PONV (postoperative nausea and vomiting)   . Pneumonia     June 2015  . Tuberculosis     hx of 1985  . Headache     hx of migraines  . Hepatitis     hx of dx during ruptured appendix hospitalization  . Irritable bowel syndrome   . Endometriosis     hx of    Discharge Diagnoses:   Active Problems:   OA (osteoarthritis) of knee  Estimated body mass index is 41.33 kg/(m^2) as calculated from the following:   Height as of this encounter: _0  (1.575 m).   Weight as of this encounter: 102.513 kg (226 lb).  Procedure:  Procedure(s) (LRB): MEDIAL UNICOMPARTMENTAL RIGHT KNEE ARTHROPLASTY (Right)   Consults: None  HPI: Shelly [name] is a 58 y.o. female, who has  significant isolated medial compartment arthritis of the Right knee. She has had nonoperative management including injections. She has had  cortisone and viscous supplements. Unfortunately, the pain persists.  Radiograph showed isolated medial compartment bone-on-bone arthritis  with normal-appearing patellofemoral and lateral compartments. She  presents now for left knee unicompartmental arthroplasty.  Laboratory Data: Admission on 05/21/2014  Component Date Value Ref Range Status  . Glucose-Capillary 05/21/2014 172* 70 - 99 mg/dL Final  . Comment 1 05/21/2014 Notify RN   Final  . Glucose-Capillary 05/21/2014 152* 70 -  99 mg/dL Final  . Glucose-Capillary 05/21/2014 219* 70 - 99 mg/dL Final  . WBC 05/22/2014 14.4* 4.0 - 10.5 K/uL Final  . RBC 05/22/2014 3.33* 3.87 - 5.11 MIL/uL Final  . Hemoglobin 05/22/2014 9.5* 12.0 - 15.0 g/dL Final  . HCT 05/22/2014 28.8* 36.0 - 46.0 % Final  . MCV 05/22/2014 86.5  78.0 - 100.0 fL Final  . MCH 05/22/2014 28.5  26.0 - 34.0 pg Final  . MCHC 05/22/2014 33.0  30.0 - 36.0 g/dL Final  . RDW 05/22/2014 13.5  11.5 - 15.5 % Final  . Platelets 05/22/2014 277  150 - 400 K/uL Final  . Sodium 05/22/2014 134* 137 - 147 mEq/L Final  . Potassium 05/22/2014 5.0  3.7 - 5.3 mEq/L Final  . Chloride 05/22/2014 99  96 - 112 mEq/L Final  . CO2 05/22/2014 24  19 - 32 mEq/L Final  . Glucose, Bld 05/22/2014 289* 70 - 99 mg/dL Final  . BUN 05/22/2014 16  6 - 23 mg/dL Final  . Creatinine, Ser 05/22/2014 1.37* 0.50 - 1.10 mg/dL Final  . Calcium 05/22/2014 8.7  8.4 - 10.5 mg/dL Final  . GFR calc non Af Amer 05/22/2014 42* >90 mL/min Final  . GFR calc Af Amer 05/22/2014 48* >90 mL/min Final   Comment: (NOTE) The eGFR has been calculated using the CKD EPI equation. This calculation has not been validated in all clinical situations. eGFR's persistently <90  mL/min signify possible Chronic Kidney Disease.   . Anion gap 05/22/2014 11  5 - 15 Final  . Glucose-Capillary 05/21/2014 418* 70 - 99 mg/dL Final  . Glucose, Bld 05/21/2014 405* 70 - 99 mg/dL Final  . Glucose-Capillary 05/21/2014 367* 70 - 99 mg/dL Final  . Glucose-Capillary 05/21/2014 325* 70 - 99 mg/dL Final  . Glucose-Capillary 05/22/2014 221* 70 - 99 mg/dL Final  Hospital Outpatient Visit on 05/14/2014  Component Date Value Ref Range Status  . aPTT 05/14/2014 48* 24 - 37 seconds Final   Comment:        IF BASELINE aPTT IS ELEVATED, SUGGEST PATIENT RISK ASSESSMENT BE USED TO DETERMINE APPROPRIATE ANTICOAGULANT THERAPY.   . WBC 05/14/2014 16.7* 4.0 - 10.5 K/uL Final  . RBC 05/14/2014 3.77* 3.87 - 5.11 MIL/uL Final  . Hemoglobin  05/14/2014 10.7* 12.0 - 15.0 g/dL Final  . HCT 05/14/2014 32.8* 36.0 - 46.0 % Final  . MCV 05/14/2014 87.0  78.0 - 100.0 fL Final  . MCH 05/14/2014 28.4  26.0 - 34.0 pg Final  . MCHC 05/14/2014 32.6  30.0 - 36.0 g/dL Final  . RDW 05/14/2014 13.9  11.5 - 15.5 % Final  . Platelets 05/14/2014 364  150 - 400 K/uL Final  . Sodium 05/14/2014 135* 137 - 147 mEq/L Final  . Potassium 05/14/2014 4.5  3.7 - 5.3 mEq/L Final  . Chloride 05/14/2014 95* 96 - 112 mEq/L Final  . CO2 05/14/2014 26  19 - 32 mEq/L Final  . Glucose, Bld 05/14/2014 204* 70 - 99 mg/dL Final  . BUN 05/14/2014 19  6 - 23 mg/dL Final  . Creatinine, Ser 05/14/2014 0.93  0.50 - 1.10 mg/dL Final  . Calcium 05/14/2014 9.9  8.4 - 10.5 mg/dL Final  . Total Protein 05/14/2014 8.4* 6.0 - 8.3 g/dL Final  . Albumin 05/14/2014 3.9  3.5 - 5.2 g/dL Final  . AST 05/14/2014 12  0 - 37 U/L Final  . ALT 05/14/2014 10  0 - 35 U/L Final  . Alkaline Phosphatase 05/14/2014 87  39 - 117 U/L Final  . Total Bilirubin 05/14/2014 0.7  0.3 - 1.2 mg/dL Final  . GFR calc non Af Amer 05/14/2014 66* >90 mL/min Final  . GFR calc Af Amer 05/14/2014 77* >90 mL/min Final   Comment: (NOTE) The eGFR has been calculated using the CKD EPI equation. This calculation has not been validated in all clinical situations. eGFR's persistently <90 mL/min signify possible Chronic Kidney Disease.   . Anion gap 05/14/2014 14  5 - 15 Final  . Prothrombin Time 05/14/2014 14.7  11.6 - 15.2 seconds Final  . INR 05/14/2014 1.14  0.00 - 1.49 Final  . Color, Urine 05/14/2014 YELLOW  YELLOW Final  . APPearance 05/14/2014 CLEAR  CLEAR Final  . Specific Gravity, Urine 05/14/2014 1.005  1.005 - 1.030 Final  . pH 05/14/2014 6.0  5.0 - 8.0 Final  . Glucose, UA 05/14/2014 NEGATIVE  NEGATIVE mg/dL Final  . Hgb urine dipstick 05/14/2014 TRACE* NEGATIVE Final  . Bilirubin Urine 05/14/2014 NEGATIVE  NEGATIVE Final  . Ketones, ur 05/14/2014 NEGATIVE  NEGATIVE mg/dL Final  . Protein, ur  05/14/2014 30* NEGATIVE mg/dL Final  . Urobilinogen, UA 05/14/2014 0.2  0.0 - 1.0 mg/dL Final  . Nitrite 05/14/2014 NEGATIVE  NEGATIVE Final  . Leukocytes, UA 05/14/2014 NEGATIVE  NEGATIVE Final  . MRSA, PCR 05/14/2014 NEGATIVE  NEGATIVE Final  . Staphylococcus aureus 05/14/2014 NEGATIVE  NEGATIVE Final   Comment:  The Xpert SA Assay (FDA approved for NASAL specimens in patients over 20 years of age), is one component of a comprehensive surveillance program.  Test performance has been validated by [employer] for patients greater than or equal to 17 year old. It is not intended to diagnose infection nor to guide or monitor treatment.   . ABO/RH(D) 05/14/2014 O NEG   Final  . Antibody Screen 05/14/2014 NEG   Final  . Sample Expiration 05/14/2014 05/24/2014   Final  . Squamous Epithelial / LPF 05/14/2014 RARE  RARE Final  . RBC / HPF 05/14/2014 0-2  <3 RBC/hpf Final  . Bacteria, UA 05/14/2014 RARE  RARE Final     X-Rays:No results found.  EKG: Orders placed or performed in visit on 10/19/13  . EKG 12-Lead     Hospital Course: Shelly [name] is a 58 y.o. who was admitted to [facility]. They were brought to the operating room on 05/21/2014 and underwent Procedure(s): MEDIAL UNICOMPARTMENTAL RIGHT KNEE ARTHROPLASTY.  Patient tolerated the procedure well and was later transferred to the recovery room and then to the orthopaedic floor for postoperative care.  They were given PO and IV analgesics for pain control following their surgery.  They were given 24 hours of postoperative antibiotics of  Anti-infectives    Start     Dose/Rate Route Frequency Ordered Stop   05/21/14 1600  ceFAZolin (ANCEF) IVPB 2 g/50 mL premix     2 g100 mL/hr over 30 Minutes Intravenous Every 6 hours 05/21/14 1226 05/21/14 2259   05/21/14 0700  ceFAZolin (ANCEF) IVPB 2 g/50 mL premix     2 g100 mL/hr over 30 Minutes Intravenous On call to O.R. 05/21/14 0700 05/21/14 0930     and started on  DVT prophylaxis in the form of Xarelto.   PT and OT were ordered for total joint protocol.  Discharge planning consulted to help with postop disposition and equipment needs.  Patient had a decent night on the evening of surgery.  They started to get up OOB with therapy on day one. Hemovac drain was pulled without difficulty.  Patient was seen in rounds on day one by Dr. Wynelle Link.  It was felt that as long as they did well with therapy then she would be ready to go home.  Discharge home with home health Diet - Cardiac diet Follow up - in 2 weeks Activity - WBAT Disposition - Home Condition Upon Discharge - Good D/C Meds - See DC Summary DVT Prophylaxis - Xarelto for 10 days and then Aspirin 325 mg daily for three weeks.  Discharge Instructions    Call MD / Call 911    Complete by:  As directed   If you experience chest pain or shortness of breath, CALL 911 and be transported to the hospital emergency room.  If you develope a fever above 101 F, pus (white drainage) or increased drainage or redness at the wound, or calf pain, call your surgeon's office.     Change dressing    Complete by:  As directed   Change dressing daily with sterile 4 x 4 inch gauze dressing and apply TED hose. Do not submerge the incision under water.     Constipation Prevention    Complete by:  As directed   Drink plenty of fluids.  Prune juice may be helpful.  You may use a stool softener, such as Colace (over the counter) 100 mg twice a day.  Use MiraLax (over the counter)  for constipation as needed.     Diet - low sodium heart healthy    Complete by:  As directed      Discharge instructions    Complete by:  As directed   Pick up stool softner and laxative for home. Do not submerge incision under water. May shower starting Thursday. Continue to use ice for pain and swelling from surgery.  Take daily for nine more days and then discontinue.  Following completion of the Xarelto, start a 325 mg Aspirin daily for three  more weeks.     Do not put a pillow under the knee. Place it under the heel.    Complete by:  As directed      Do not sit on low chairs, stoools or toilet seats, as it may be difficult to get up from low surfaces    Complete by:  As directed      Driving restrictions    Complete by:  As directed   No driving until released by the physician.     Increase activity slowly as tolerated    Complete by:  As directed      Lifting restrictions    Complete by:  As directed   No lifting until released by the physician.     Patient may shower    Complete by:  As directed   You may shower without a dressing once there is no drainage.  Do not wash over the wound.  If drainage remains, do not shower until drainage stops.     TED hose    Complete by:  As directed   Use stockings (TED hose) for 3 weeks on both leg(s).  You may remove them at night for sleeping.     Weight bearing as tolerated    Complete by:  As directed   Laterality:  right  Extremity:  Lower            Medication List    STOP taking these medications        HYDROcodone-acetaminophen 5-325 MG per tablet  Commonly known as:  NORCO/VICODIN     ibuprofen 200 MG tablet  Commonly known as:  ADVIL,MOTRIN      TAKE these medications        acetaminophen 500 MG tablet  Commonly known as:  TYLENOL  Take 1,500 mg by mouth every 6 (six) hours as needed for mild pain or moderate pain.     albuterol 108 (90 BASE) MCG/ACT inhaler  Commonly known as:  PROVENTIL HFA;VENTOLIN HFA  Inhale 1 puff into the lungs every 6 (six) hours as needed for wheezing or shortness of breath.     ALPRAZolam 0.5 MG tablet  Commonly known as:  XANAX  Take 0.5 mg by mouth 3 (three) times daily as needed. Anxiety     cetirizine 10 MG tablet  Commonly known as:  ZYRTEC  Take 10 mg by mouth daily.     insulin glargine 100 UNIT/ML injection  Commonly known as:  LANTUS  Inject 30 Units into the skin 2 (two) times daily.     losartan 100 MG tablet   Commonly known as:  COZAAR  Take 100 mg by mouth every morning.     methocarbamol 500 MG tablet  Commonly known as:  ROBAXIN  Take 1 tablet (500 mg total) by mouth every 6 (six) hours as needed for muscle spasms.     montelukast 10 MG tablet  Commonly known as:  SINGULAIR  Take  10 mg by mouth at bedtime.     omeprazole 20 MG tablet  Commonly known as:  PRILOSEC OTC  Take 20 mg by mouth daily.     oxyCODONE 5 MG immediate release tablet  Commonly known as:  Oxy IR/ROXICODONE  Take 1-2 tablets (5-10 mg total) by mouth every 3 (three) hours as needed for moderate pain, severe pain or breakthrough pain.     rivaroxaban 10 MG Tabs tablet  Commonly known as:  XARELTO  Take 1 tablet (10 mg total) by mouth daily with breakfast. Take daily for nine more days and then discontinue.  Following completion of the Xarelto, start a 325 mg Aspirin daily for three more weeks.     rosuvastatin 10 MG tablet  Commonly known as:  CRESTOR  Take 10 mg by mouth every evening.     traMADol 50 MG tablet  Commonly known as:  ULTRAM  Take 1-2 tablets (50-100 mg total) by mouth every 6 (six) hours as needed (mild pain).           Follow-up Information    Follow up with Gearlean Alf, MD. Schedule an appointment as soon as possible for a visit on 06/05/2014.   Specialty:  Orthopedic Surgery   Why:  Call office at [phone] to setup appointment for follow up on Tuesday 06/05/2014   Contact information:   [address] [place] [address] [phone]       Signed: Arlee Muslim, PA-C Orthopaedic Surgery 05/22/2014, 8:02 AM

## 2014-05-22 NOTE — Progress Notes (Signed)
   Subjective: 1 Day Post-Op Procedure(s) (LRB): MEDIAL UNICOMPARTMENTAL RIGHT KNEE ARTHROPLASTY (Right) Patient reports pain as mild.   Patient seen in rounds with Dr. Lequita Halt. Patient is well, and has had no acute complaints or problems Patient is ready to go home today following therapy.  Objective: Vital signs in last 24 hours: Temp:  [97.7 F (36.5 C)-98.1 F (36.7 C)] 98.1 F (36.7 C) (11/10 0538) Pulse Rate:  [64-87] 73 (11/10 0538) Resp:  [12-18] 18 (11/10 0538) BP: (116-157)/(55-91) 124/59 mmHg (11/10 0538) SpO2:  [94 %-100 %] 98 % (11/10 0538)  Intake/Output from previous day:  Intake/Output Summary (Last 24 hours) at 05/22/14 0753 Last data filed at 05/22/14 0700  Gross per 24 hour  Intake 4493.75 ml  Output   2570 ml  Net 1923.75 ml    Labs:  Recent Labs  05/22/14 0424  HGB 9.5*    Recent Labs  05/22/14 0424  WBC 14.4*  RBC 3.33*  HCT 28.8*  PLT 277    Recent Labs  05/21/14 1817 05/22/14 0424  NA  --  134*  K  --  5.0  CL  --  99  CO2  --  24  BUN  --  16  CREATININE  --  1.37*  GLUCOSE 405* 289*  CALCIUM  --  8.7   No results for input(s): LABPT, INR in the last 72 hours.  EXAM: General - Patient is Alert, Appropriate and Oriented Extremity - Neurovascular intact Sensation intact distally Dressing - clean, dry, no drainage Motor Function - intact, moving foot and toes well on exam.   Assessment/Plan: 1 Day Post-Op Procedure(s) (LRB): MEDIAL UNICOMPARTMENTAL RIGHT KNEE ARTHROPLASTY (Right) Procedure(s) (LRB): MEDIAL UNICOMPARTMENTAL RIGHT KNEE ARTHROPLASTY (Right) Past Medical History  Diagnosis Date  . Diabetes mellitus   . Hypertension   . Hyperlipemia   . Acid reflux   . Anxiety   . Obesity   . Asthma     infrequent problems  . Arthritis   . Rash     sides of neck  . Stress incontinence   . Complication of anesthesia     "I quit breathing during surgery" 2008 - has had surgery since with no problems   . PONV  (postoperative nausea and vomiting)   . Pneumonia     June 2015  . Tuberculosis     hx of 1985  . Headache     hx of migraines  . Hepatitis     hx of dx during ruptured appendix hospitalization  . Irritable bowel syndrome   . Endometriosis     hx of    Active Problems:   OA (osteoarthritis) of knee  Estimated body mass index is 41.33 kg/(m^2) as calculated from the following:   Height as of this encounter: 5\' 2"  (1.575 m).   Weight as of this encounter: 102.513 kg (226 lb). Advance diet Up with therapy Discharge home with home health Diet - Cardiac diet Follow up - in 2 weeks Activity - WBAT Disposition - Home Condition Upon Discharge - Good D/C Meds - See DC Summary DVT Prophylaxis - Xarelto for 10 days and then Aspirin 325 mg daily for three weeks.  Avel Peace, PA-C Orthopaedic Surgery 05/22/2014, 7:53 AM

## 2014-05-22 NOTE — Care Management Note (Signed)
    Page 1 of 2   05/22/2014     9:34:08 AM CARE MANAGEMENT NOTE 05/22/2014  Patient:  Shelly [name],Shelly [name]   Account Number:  [id number]  Date Initiated:  05/22/2014  Documentation initiated by:  [facility]  Subjective/Objective Assessment:   adm: Medial compartment osteoarthritis, Right knee     Action/Plan:   [place] planning   Anticipated DC Date:  05/22/2014   Anticipated DC Plan:  [place]  CM consult      [facility] Choice  HOME HEALTH   Choice offered to / List presented to:  C-1 Patient   DME arranged  NA      DME agency  NA     [place] arranged  HH-2 PT      [place]   Status of service:  Completed, signed off Medicare Important Message given?   (If response is "NO", the following Medicare IM given date fields will be blank) Date Medicare IM given:   Medicare IM given by:   Date Additional Medicare IM given:   Additional Medicare IM given by:    Discharge Disposition:  [place]  Per UR Regulation:    If discussed at Long Length of Stay Meetings, dates discussed:    Comments:  05/22/14 08:00 Cm met with pt in room to offer choice of home health agency.  Pt chooses [facility] to render HHPT. address and contact information verified with pt.  NO DME needed as pt just had surgery this past april and has both 3n1 and rolling walker.  Referral called to [facility] rep, Stanton Kidney.  No other CM needs were communicated.  Mariane Masters, BSN, CM [phone].

## 2014-05-22 NOTE — Discharge Instructions (Addendum)
Dr. Gaynelle Arabian Total Joint Specialist [facility] [address] [place], [place] [address] [phone]  UNI KNEE REPLACEMENT POSTOPERATIVE DIRECTIONS   Knee Rehabilitation, Guidelines Following Surgery  Results after knee surgery are often greatly improved when you follow the exercise, range of motion and muscle strengthening exercises prescribed by your doctor. Safety measures are also important to protect the knee from further injury. Any time any of these exercises cause you to have increased pain or swelling in your knee joint, decrease the amount until you are comfortable again and slowly increase them. If you have problems or questions, call your caregiver or physical therapist for advice.   HOME CARE INSTRUCTIONS  Remove items at home which could result in a fall. This includes throw rugs or furniture in walking pathways.  Continue medications as instructed at time of discharge. You may have some home medications which will be placed on hold until you complete the course of blood thinner medication.  You may start showering once you are discharged home but do not submerge the incision under water. Just pat the incision dry and apply a dry gauze dressing on daily. Walk with walker as instructed.  You may resume a sexual relationship in one month or when given the OK by  your doctor.   Use walker as long as suggested by your caregivers.  Avoid periods of inactivity such as sitting longer than an hour when not asleep. This helps prevent blood clots.  You may put full weight on your legs and walk as much as is comfortable.  You may return to work once you are cleared by your doctor.  Do not drive a car for 6 weeks or until released by you surgeon.   Do not drive while taking narcotics.  Wear the elastic stockings for three weeks following surgery during the day but you may remove then at night. Make sure you keep all of your appointments after your  operation with all of your doctors and caregivers. You should call the office at the above phone number and make an appointment for approximately two weeks after the date of your surgery. Change the dressing daily and reapply a dry dressing each time. Please pick up a stool softener and laxative for home use as long as you are requiring pain medications.  Continue to use ice on the knee for pain and swelling from surgery. You may notice swelling that will progress down to the foot and ankle.  This is normal after surgery.  Elevate the leg when you are not up walking on it.   It is important for you to complete the blood thinner medication as prescribed by your doctor.  Continue to use the breathing machine which will help keep your temperature down.  It is common for your temperature to cycle up and down following surgery, especially at night when you are not up moving around and exerting yourself.  The breathing machine keeps your lungs expanded and your temperature down.  RANGE OF MOTION AND STRENGTHENING EXERCISES  Rehabilitation of the knee is important following a knee injury or an operation. After just a few days of immobilization, the muscles of the thigh which control the knee become weakened and shrink (atrophy). Knee exercises are designed to build up the tone and strength of the thigh muscles and to improve knee motion. Often times heat used for twenty to thirty minutes before working out will loosen up your tissues and help with improving the range  of motion but do not use heat for the first two weeks following surgery. These exercises can be done on a training (exercise) mat, on the floor, on a table or on a bed. Use what ever works the best and is most comfortable for you Knee exercises include:  Leg Lifts - While your knee is still immobilized in a splint or cast, you can do straight leg raises. Lift the leg to 60 degrees, hold for 3 sec, and slowly lower the leg. Repeat 10-20 times 2-3  times daily. Perform this exercise against resistance later as your knee gets better.  Quad and Hamstring Sets - Tighten up the muscle on the front of the thigh (Quad) and hold for 5-10 sec. Repeat this 10-20 times hourly. Hamstring sets are done by pushing the foot backward against an object and holding for 5-10 sec. Repeat as with quad sets.  A rehabilitation program following serious knee injuries can speed recovery and prevent re-injury in the future due to weakened muscles. Contact your doctor or a physical therapist for more information on knee rehabilitation.   SKILLED REHAB INSTRUCTIONS: If the patient is transferred to a skilled rehab facility following release from the hospital, a list of the current medications will be sent to the facility for the patient to continue.  When discharged from the skilled rehab facility, please have the facility set up the patient's Home Health Physical Therapy prior to being released. Also, the skilled facility will be responsible for providing the patient with their medications at time of release from the facility to include their pain medication, the muscle relaxants, and their blood thinner medication. If the patient is still at the rehab facility at time of the two week follow up appointment, the skilled rehab facility will also need to assist the patient in arranging follow up appointment in our office and any transportation needs.  MAKE SURE YOU:  Understand these instructions.  Will watch your condition.  Will get help right away if you are not doing well or get worse.    Pick up stool softner and laxative for home. Do not submerge incision under water. May shower. Continue to use ice for pain and swelling from surgery.  Take Xarelto for a total of ten days, then discontinue Xarelto. After completing the Xarelto, take a full dose 325 mg Aspirin for three more weeks.   Postoperative Constipation Protocol  Constipation - defined medically as  fewer than three stools per week and severe constipation as less than one stool per week.  One of the most common issues patients have following surgery is constipation.  Even if you have a regular bowel pattern at home, your normal regimen is likely to be disrupted due to multiple reasons following surgery.  Combination of anesthesia, postoperative narcotics, change in appetite and fluid intake all can affect your bowels.  In order to avoid complications following surgery, here are some recommendations in order to help you during your recovery period.  Colace (docusate) - Pick up an over-the-counter form of Colace or another stool softener and take twice a day as long as you are requiring postoperative pain medications.  Take with a full glass of water daily.  If you experience loose stools or diarrhea, hold the colace until you stool forms back up.  If your symptoms do not get better within 1 week or if they get worse, check with your doctor.  Dulcolax (bisacodyl) - Pick up over-the-counter and take as directed by the product packaging  as needed to assist with the movement of your bowels.  Take with a full glass of water.  Use this product as needed if not relieved by Colace only.   MiraLax (polyethylene glycol) - Pick up over-the-counter to have on hand.  MiraLax is a solution that will increase the amount of water in your bowels to assist with bowel movements.  Take as directed and can mix with a glass of water, juice, soda, coffee, or tea.  Take if you go more than two days without a movement. Do not use MiraLax more than once per day. Call your doctor if you are still constipated or irregular after using this medication for 7 days in a row.  If you continue to have problems with postoperative constipation, please contact the office for further assistance and recommendations.  If you experience "the worst abdominal pain ever" or develop nausea or vomiting, please contact the office immediatly for  further recommendations for treatment.  Information on my medicine - XARELTO (Rivaroxaban)  This medication education was reviewed with me or my healthcare representative as part of my discharge preparation.  The pharmacist that spoke with me during my hospital stay was:  Otho BellowsGreen, Fionn Stracke L, [facility]  Why was Xarelto prescribed for you? Xarelto was prescribed for you to reduce the risk of blood clots forming after orthopedic surgery. The medical term for these abnormal blood clots is venous thromboembolism (VTE).  What do you need to know about xarelto ? Take your Xarelto ONCE DAILY at the same time every day. You may take it either with or without food.  If you have difficulty swallowing the tablet whole, you may crush it and mix in applesauce just prior to taking your dose.  Take Xarelto exactly as prescribed by your doctor and DO NOT stop taking Xarelto without talking to the doctor who prescribed the medication.  Stopping without other VTE prevention medication to take the place of Xarelto may increase your risk of developing a clot.  After discharge, you should have regular check-up appointments with your healthcare provider that is prescribing your Xarelto.    What do you do if you miss a dose? If you miss a dose, take it as soon as you remember on the same day then continue your regularly scheduled once daily regimen the next day. Do not take two doses of Xarelto on the same day.   Important Safety Information A possible side effect of Xarelto is bleeding. You should call your healthcare provider right away if you experience any of the following: ? Bleeding from an injury or your nose that does not stop. ? Unusual colored urine (red or dark brown) or unusual colored stools (red or black). ? Unusual bruising for unknown reasons. ? A serious fall or if you hit your head (even if there is no bleeding).  Some medicines may interact with Xarelto and might increase your risk of  bleeding while on Xarelto. To help avoid this, consult your healthcare provider or pharmacist prior to using any new prescription or non-prescription medications, including herbals, vitamins, non-steroidal anti-inflammatory drugs (NSAIDs) and supplements.  This website has more information on Xarelto: [email or web address].xarelto.[email or web address].

## 2014-05-22 NOTE — Progress Notes (Signed)
Discharge summary sent to payer through [facility]  

## 2014-05-22 NOTE — Progress Notes (Signed)
Attempted to see pt for OT eval.  Pt has opposite  knee done recently and does not feel she needs OT since she is aware of all adl techniques and has assist at home.  Will d/c OT at this time. Tory Emerald, [place] [phone]

## 2014-05-22 NOTE — Progress Notes (Signed)
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  Follow Up Recommendations  Home health PT;Supervision/Assistance - 24 hour     Equipment Recommendations  None recommended by PT    Recommendations for Other Services       Precautions / Restrictions Precautions Precautions: Knee    Mobility  Bed Mobility   Bed Mobility: Sit to Supine       Sit to supine: Supervision   General bed mobility comments: cues for  technique  Transfers   Equipment used: Rolling walker (2 wheeled) Transfers: Sit to/from Stand Sit to Stand: Supervision            Ambulation/Gait Ambulation/Gait assistance: Min guard Ambulation Distance (Feet): 150 Feet Assistive device: Rolling walker (2 wheeled) Gait Pattern/deviations: Step-through pattern;Antalgic     General Gait Details: cues for sequence   Stairs            Wheelchair Mobility    Modified Rankin (Stroke Patients Only)       Balance                                    Cognition                            Exercises Total Joint Exercises Quad Sets: AROM;Right;10 reps;Supine Heel Slides: AAROM;Right;10 reps;Supine Hip ABduction/ADduction: AAROM;Right;10 reps;Supine Straight Leg Raises: AAROM;Right;10 reps;Supine Goniometric ROM: 10-60    General Comments        Pertinent Vitals/Pain Pain Score: 4  Pain Location: R knee Pain Descriptors / Indicators: Discomfort Pain Intervention(s): Premedicated before session;Ice applied    Home Living                      Prior Function            PT Goals (current goals can now be found in the care plan section) Progress towards PT goals: Progressing toward goals    Frequency  7X/week    PT Plan Current plan remains appropriate     Co-evaluation             End of Session   Activity Tolerance: Patient tolerated treatment well Patient left: in bed;with call bell/phone within reach     Time: [id number] PT Time Calculation (min) (ACUTE ONLY): 29 min  Charges:  $Gait Training: 8-22 mins $Therapeutic Exercise: 8-22 mins                    G Codes:      Rada Hay 05/22/2014, 1:36 PM

## 2014-06-28 ENCOUNTER — Inpatient Hospital Stay (HOSPITAL_COMMUNITY)
Admission: EM | Admit: 2014-06-28 | Discharge: 2014-07-01 | DRG: 286 | Disposition: A | Discharge status: Home Health | Payer: 59 | Attending: Internal Medicine | Admitting: Internal Medicine

## 2014-06-28 ENCOUNTER — Encounter (HOSPITAL_COMMUNITY): Payer: Self-pay | Admitting: Emergency Medicine

## 2014-06-28 ENCOUNTER — Emergency Department (HOSPITAL_COMMUNITY): Payer: 59

## 2014-06-28 DIAGNOSIS — I272 Other secondary pulmonary hypertension: Secondary | ICD-10-CM | POA: Diagnosis present

## 2014-06-28 DIAGNOSIS — I10 Essential (primary) hypertension: Secondary | ICD-10-CM | POA: Diagnosis present

## 2014-06-28 DIAGNOSIS — F419 Anxiety disorder, unspecified: Secondary | ICD-10-CM | POA: Diagnosis present

## 2014-06-28 DIAGNOSIS — J9600 Acute respiratory failure, unspecified whether with hypoxia or hypercapnia: Secondary | ICD-10-CM | POA: Diagnosis present

## 2014-06-28 DIAGNOSIS — Z882 Allergy status to sulfonamides status: Secondary | ICD-10-CM

## 2014-06-28 DIAGNOSIS — J45909 Unspecified asthma, uncomplicated: Secondary | ICD-10-CM | POA: Diagnosis present

## 2014-06-28 DIAGNOSIS — K589 Irritable bowel syndrome without diarrhea: Secondary | ICD-10-CM | POA: Diagnosis present

## 2014-06-28 DIAGNOSIS — I509 Heart failure, unspecified: Secondary | ICD-10-CM

## 2014-06-28 DIAGNOSIS — Z79899 Other long term (current) drug therapy: Secondary | ICD-10-CM

## 2014-06-28 DIAGNOSIS — Z96653 Presence of artificial knee joint, bilateral: Secondary | ICD-10-CM | POA: Diagnosis present

## 2014-06-28 DIAGNOSIS — Z6839 Body mass index (BMI) 39.0-39.9, adult: Secondary | ICD-10-CM

## 2014-06-28 DIAGNOSIS — E119 Type 2 diabetes mellitus without complications: Secondary | ICD-10-CM

## 2014-06-28 DIAGNOSIS — I428 Other cardiomyopathies: Secondary | ICD-10-CM | POA: Diagnosis present

## 2014-06-28 DIAGNOSIS — Z880 Allergy status to penicillin: Secondary | ICD-10-CM

## 2014-06-28 DIAGNOSIS — I5021 Acute systolic (congestive) heart failure: Principal | ICD-10-CM | POA: Insufficient documentation

## 2014-06-28 DIAGNOSIS — K219 Gastro-esophageal reflux disease without esophagitis: Secondary | ICD-10-CM | POA: Diagnosis present

## 2014-06-28 DIAGNOSIS — Z888 Allergy status to other drugs, medicaments and biological substances status: Secondary | ICD-10-CM | POA: Diagnosis not present

## 2014-06-28 DIAGNOSIS — I472 Ventricular tachycardia: Secondary | ICD-10-CM | POA: Diagnosis not present

## 2014-06-28 DIAGNOSIS — R0602 Shortness of breath: Secondary | ICD-10-CM | POA: Insufficient documentation

## 2014-06-28 DIAGNOSIS — E785 Hyperlipidemia, unspecified: Secondary | ICD-10-CM | POA: Diagnosis present

## 2014-06-28 DIAGNOSIS — Z794 Long term (current) use of insulin: Secondary | ICD-10-CM

## 2014-06-28 DIAGNOSIS — E669 Obesity, unspecified: Secondary | ICD-10-CM | POA: Diagnosis present

## 2014-06-28 DIAGNOSIS — R0600 Dyspnea, unspecified: Secondary | ICD-10-CM | POA: Insufficient documentation

## 2014-06-28 DIAGNOSIS — I059 Rheumatic mitral valve disease, unspecified: Secondary | ICD-10-CM

## 2014-06-28 LAB — COMPREHENSIVE METABOLIC PANEL
ALT: 9 U/L (ref 0–35)
AST: 16 U/L (ref 0–37)
Albumin: 3.4 g/dL — ABNORMAL LOW (ref 3.5–5.2)
Alkaline Phosphatase: 92 U/L (ref 39–117)
Anion gap: 13 (ref 5–15)
BUN: 20 mg/dL (ref 6–23)
CALCIUM: 9.2 mg/dL (ref 8.4–10.5)
CO2: 22 mEq/L (ref 19–32)
Chloride: 102 mEq/L (ref 96–112)
Creatinine, Ser: 0.89 mg/dL (ref 0.50–1.10)
GFR calc Af Amer: 81 mL/min — ABNORMAL LOW (ref >90)
GFR calc non Af Amer: 70 mL/min — ABNORMAL LOW (ref >90)
Glucose, Bld: 310 mg/dL — ABNORMAL HIGH (ref 70–99)
POTASSIUM: 4.7 meq/L (ref 3.7–5.3)
Sodium: 137 mEq/L (ref 137–147)
TOTAL PROTEIN: 7.7 g/dL (ref 6.0–8.3)
Total Bilirubin: 0.6 mg/dL (ref 0.3–1.2)

## 2014-06-28 LAB — GLUCOSE, CAPILLARY: GLUCOSE-CAPILLARY: 311 mg/dL — AB (ref 70–99)

## 2014-06-28 LAB — TROPONIN I: Troponin I: <0.3 ng/mL (ref <0.30)

## 2014-06-28 LAB — CBC WITH DIFFERENTIAL/PLATELET
BASOS ABS: 0.1 10*3/uL (ref 0.0–0.1)
Basophils Relative: 0 % (ref 0–1)
EOS ABS: 0.1 10*3/uL (ref 0.0–0.7)
EOS PCT: 0 % (ref 0–5)
HCT: 31.9 % — ABNORMAL LOW (ref 36.0–46.0)
Hemoglobin: 10 g/dL — ABNORMAL LOW (ref 12.0–15.0)
Lymphocytes Relative: 6 % — ABNORMAL LOW (ref 12–46)
Lymphs Abs: 0.7 10*3/uL (ref 0.7–4.0)
MCH: 27.3 pg (ref 26.0–34.0)
MCHC: 31.3 g/dL (ref 30.0–36.0)
MCV: 87.2 fL (ref 78.0–100.0)
Monocytes Absolute: 0.2 10*3/uL (ref 0.1–1.0)
Monocytes Relative: 1 % — ABNORMAL LOW (ref 3–12)
Neutro Abs: 11 10*3/uL — ABNORMAL HIGH (ref 1.7–7.7)
Neutrophils Relative %: 93 % — ABNORMAL HIGH (ref 43–77)
PLATELETS: 339 10*3/uL (ref 150–400)
RBC: 3.66 MIL/uL — AB (ref 3.87–5.11)
RDW: 14.7 % (ref 11.5–15.5)
WBC: 11.9 10*3/uL — ABNORMAL HIGH (ref 4.0–10.5)

## 2014-06-28 LAB — D-DIMER, QUANTITATIVE (NOT AT ARMC): D DIMER QUANT: 3.07 ug{FEU}/mL — AB (ref 0.00–0.48)

## 2014-06-28 LAB — PRO B NATRIURETIC PEPTIDE: PRO B NATRI PEPTIDE: 2230 pg/mL — AB (ref 0–125)

## 2014-06-28 MED ORDER — FLUTICASONE PROPIONATE 50 MCG/ACT NA SUSP
1.0000 | Freq: Every day | NASAL | Status: DC | PRN
  Administered 2014-06-30 – 2014-07-01 (×2): 1 via NASAL
  Filled 2014-06-28: qty 16

## 2014-06-28 MED ORDER — ALPRAZOLAM 0.5 MG PO TABS
0.5000 mg | ORAL_TABLET | Freq: Three times a day (TID) | ORAL | Status: DC | PRN
  Administered 2014-06-28 – 2014-07-01 (×4): 0.5 mg via ORAL
  Filled 2014-06-28 (×5): qty 1

## 2014-06-28 MED ORDER — MOMETASONE FURO-FORMOTEROL FUM 100-5 MCG/ACT IN AERO
2.0000 | INHALATION_SPRAY | Freq: Two times a day (BID) | RESPIRATORY_TRACT | Status: DC
  Administered 2014-06-28 – 2014-07-01 (×6): 2 via RESPIRATORY_TRACT
  Filled 2014-06-28: qty 8.8

## 2014-06-28 MED ORDER — POLYETHYLENE GLYCOL 3350 17 G PO PACK
17.0000 g | PACK | Freq: Every day | ORAL | Status: DC | PRN
  Administered 2014-07-01: 17 g via ORAL
  Filled 2014-06-28 (×3): qty 1

## 2014-06-28 MED ORDER — IPRATROPIUM-ALBUTEROL 20-100 MCG/ACT IN AERS: 1.0000 | INHALATION_SPRAY | Freq: Three times a day (TID) | RESPIRATORY_TRACT | Status: DC | PRN

## 2014-06-28 MED ORDER — INSULIN GLARGINE 100 UNIT/ML SOLOSTAR PEN: 20.0000 [IU] | PEN_INJECTOR | Freq: Every day | SUBCUTANEOUS | Status: DC

## 2014-06-28 MED ORDER — SODIUM CHLORIDE 0.9 % IV SOLN: 250.0000 mL | INTRAVENOUS | Status: DC | PRN

## 2014-06-28 MED ORDER — IPRATROPIUM-ALBUTEROL 0.5-2.5 (3) MG/3ML IN SOLN: 3.0000 mL | Freq: Three times a day (TID) | RESPIRATORY_TRACT | Status: DC | PRN

## 2014-06-28 MED ORDER — HYDRALAZINE HCL 25 MG PO TABS
25.0000 mg | ORAL_TABLET | Freq: Three times a day (TID) | ORAL | Status: DC
  Administered 2014-06-28 – 2014-06-29 (×3): 25 mg via ORAL
  Filled 2014-06-28 (×6): qty 1

## 2014-06-28 MED ORDER — INSULIN ASPART 100 UNIT/ML ~~LOC~~ SOLN
0.0000 [IU] | Freq: Three times a day (TID) | SUBCUTANEOUS | Status: DC
  Administered 2014-06-29: 8 [IU] via SUBCUTANEOUS
  Administered 2014-06-29 – 2014-06-30 (×3): 3 [IU] via SUBCUTANEOUS
  Administered 2014-06-30: 5 [IU] via SUBCUTANEOUS
  Administered 2014-07-01: 3 [IU] via SUBCUTANEOUS
  Administered 2014-07-01: 5 [IU] via SUBCUTANEOUS

## 2014-06-28 MED ORDER — OMEPRAZOLE MAGNESIUM 20 MG PO TBEC: 20.0000 mg | DELAYED_RELEASE_TABLET | Freq: Every day | ORAL | Status: DC

## 2014-06-28 MED ORDER — IPRATROPIUM-ALBUTEROL 20-100 MCG/ACT IN AERS: 1.0000 | INHALATION_SPRAY | Freq: Three times a day (TID) | RESPIRATORY_TRACT | Status: DC

## 2014-06-28 MED ORDER — MONTELUKAST SODIUM 10 MG PO TABS
10.0000 mg | ORAL_TABLET | Freq: Every day | ORAL | Status: DC
  Administered 2014-06-28 – 2014-06-30 (×3): 10 mg via ORAL
  Filled 2014-06-28 (×4): qty 1

## 2014-06-28 MED ORDER — LORATADINE 10 MG PO TABS
10.0000 mg | ORAL_TABLET | Freq: Every day | ORAL | Status: DC
  Administered 2014-06-28 – 2014-07-01 (×4): 10 mg via ORAL
  Filled 2014-06-28 (×4): qty 1

## 2014-06-28 MED ORDER — FUROSEMIDE 10 MG/ML IJ SOLN
20.0000 mg | Freq: Three times a day (TID) | INTRAMUSCULAR | Status: DC
  Administered 2014-06-28 – 2014-06-30 (×6): 20 mg via INTRAVENOUS
  Filled 2014-06-28 (×10): qty 2

## 2014-06-28 MED ORDER — DOXYCYCLINE MONOHYDRATE 100 MG PO TABS: 100.0000 mg | ORAL_TABLET | Freq: Two times a day (BID) | ORAL | Status: DC

## 2014-06-28 MED ORDER — INSULIN ASPART 100 UNIT/ML ~~LOC~~ SOLN
0.0000 [IU] | Freq: Every day | SUBCUTANEOUS | Status: DC
  Administered 2014-06-28: 4 [IU] via SUBCUTANEOUS
  Administered 2014-06-30: 3 [IU] via SUBCUTANEOUS

## 2014-06-28 MED ORDER — LOSARTAN POTASSIUM 50 MG PO TABS
100.0000 mg | ORAL_TABLET | Freq: Every morning | ORAL | Status: DC
  Administered 2014-06-28 – 2014-07-01 (×4): 100 mg via ORAL
  Filled 2014-06-28 (×4): qty 2

## 2014-06-28 MED ORDER — ENOXAPARIN SODIUM 40 MG/0.4ML ~~LOC~~ SOLN
40.0000 mg | SUBCUTANEOUS | Status: DC
  Administered 2014-06-28: 40 mg via SUBCUTANEOUS
  Filled 2014-06-28 (×2): qty 0.4

## 2014-06-28 MED ORDER — FUROSEMIDE 10 MG/ML IJ SOLN
20.0000 mg | Freq: Once | INTRAMUSCULAR | Status: AC
  Administered 2014-06-28: 20 mg via INTRAVENOUS
  Filled 2014-06-28: qty 2

## 2014-06-28 MED ORDER — SODIUM CHLORIDE 0.9 % IJ SOLN
3.0000 mL | Freq: Two times a day (BID) | INTRAMUSCULAR | Status: DC
  Administered 2014-06-28: 3 mL via INTRAVENOUS

## 2014-06-28 MED ORDER — PANTOPRAZOLE SODIUM 40 MG PO TBEC
40.0000 mg | DELAYED_RELEASE_TABLET | Freq: Every day | ORAL | Status: DC
  Administered 2014-06-28 – 2014-07-01 (×4): 40 mg via ORAL
  Filled 2014-06-28 (×4): qty 1

## 2014-06-28 MED ORDER — ONDANSETRON HCL 4 MG/2ML IJ SOLN: 4.0000 mg | Freq: Four times a day (QID) | INTRAMUSCULAR | Status: DC | PRN

## 2014-06-28 MED ORDER — DOXYCYCLINE HYCLATE 100 MG PO TABS
100.0000 mg | ORAL_TABLET | Freq: Two times a day (BID) | ORAL | Status: AC
  Administered 2014-06-28 (×2): 100 mg via ORAL
  Filled 2014-06-28 (×2): qty 1

## 2014-06-28 MED ORDER — ROSUVASTATIN CALCIUM 10 MG PO TABS
10.0000 mg | ORAL_TABLET | Freq: Every evening | ORAL | Status: DC
  Administered 2014-06-28 – 2014-06-30 (×3): 10 mg via ORAL
  Filled 2014-06-28 (×4): qty 1

## 2014-06-28 MED ORDER — SODIUM CHLORIDE 0.9 % IJ SOLN: 3.0000 mL | INTRAMUSCULAR | Status: DC | PRN

## 2014-06-28 MED ORDER — ONDANSETRON HCL 4 MG PO TABS
4.0000 mg | ORAL_TABLET | Freq: Four times a day (QID) | ORAL | Status: DC | PRN
  Administered 2014-06-30: 4 mg via ORAL
  Filled 2014-06-28 (×2): qty 1

## 2014-06-28 MED ORDER — ALUM & MAG HYDROXIDE-SIMETH 200-200-20 MG/5ML PO SUSP: 30.0000 mL | Freq: Four times a day (QID) | ORAL | Status: DC | PRN

## 2014-06-28 MED ORDER — ASPIRIN 81 MG PO CHEW
81.0000 mg | CHEWABLE_TABLET | ORAL | Status: AC
  Administered 2014-06-29: 81 mg via ORAL
  Filled 2014-06-28: qty 1

## 2014-06-28 MED ORDER — IOHEXOL 350 MG/ML SOLN
100.0000 mL | Freq: Once | INTRAVENOUS | Status: AC | PRN
  Administered 2014-06-28: 100 mL via INTRAVENOUS

## 2014-06-28 MED ORDER — INSULIN GLARGINE 100 UNIT/ML ~~LOC~~ SOLN
20.0000 [IU] | Freq: Every day | SUBCUTANEOUS | Status: DC
  Administered 2014-06-28 – 2014-06-29 (×2): 20 [IU] via SUBCUTANEOUS
  Filled 2014-06-28 (×3): qty 0.2

## 2014-06-28 MED ORDER — SODIUM CHLORIDE 0.9 % IJ SOLN
3.0000 mL | Freq: Two times a day (BID) | INTRAMUSCULAR | Status: DC
  Administered 2014-06-28 – 2014-06-30 (×5): 3 mL via INTRAVENOUS

## 2014-06-28 MED ORDER — HYDROCHLOROTHIAZIDE 25 MG PO TABS
25.0000 mg | ORAL_TABLET | Freq: Every day | ORAL | Status: DC
  Administered 2014-06-28: 25 mg via ORAL
  Filled 2014-06-28 (×2): qty 1

## 2014-06-28 NOTE — H&P (Addendum)
[facility] History and Physical  Shelly [name] DOB: May 17, 1956 DOA: 06/28/2014   PCP: Lupita Raider, MD    Chief Complaint: shortness of breath  HPI: Shelly [name] is a 58 y.o. female with DM on Lantus, HTN, recent right knee replacement for arthritis(11/9), GERD and obesity presenting with dyspnea for 2 wks but worse for past 3 days. She states that when talking at work, she is getting short of breath. It is much worse when she lays flat. No pedal edema. She admits to some chest tightness at rest and has been using her inhalers frequently without relief. She is quite tachycardic. The tightness has resolved since being put on O2 in the ER. She has had some pain below her left rib cage when she tries to take a deep breath and a feeling of fullness in her abdomen. No cough, fever or chills.  She mentions that her PCP has been working on controlling her BP and placed her on HCTZ a couple of months ago (noted to be PRN in our med rec). She has not been using it, thinking it would not be safe to use is through the peri-op period. She did take a tab of Prednisone yesterday (10 mg?) in case her symptoms were stemming from asthma. She had pneumonia this summer and was placed on inhalers at that point. Instead of taking her usually 20 U of Lasix at bedtime, she took 30 because of the Prednisone. She has not been taking the 20 U int he AM as ordered as her sugars are normal in the AM.    General: The patient denies anorexia, fever- had mild headache last night which has resolved- has lost 100 lb in 5 yrs Cardiac: Denies  syncope, palpitations, pedal edema  Respiratory: Denies cough, + shortness of breath, no wheezing GI: Denies severe indigestion/heartburn, abdominal pain, nausea, vomiting, diarrhea - + constipation with narcotics but now that she has discontinued them, no further consitpation GU: Denies hematuria, incontinence, dysuria  Musculoskeletal: has arthritis - had b/l  knees replaced this year- no back pain- has arthritis in hand Skin: Denies suspicious skin lesions Neurologic: Denies focal weakness or numbness, change in vision Psychiatry: Denies depression - has anxiety.   Past Medical History  Diagnosis Date  . Diabetes mellitus   . Hypertension   . Hyperlipemia   . Acid reflux   . Anxiety   . Obesity   . Asthma     infrequent problems  . Arthritis   . Stress incontinence   . Complication of anesthesia     "I quit breathing during surgery" 2008 - has had surgery since with no problems   . PONV (postoperative nausea and vomiting)   . Pneumonia     June 2015  . Tuberculosis     hx of 1985  . Headache     hx of migraines  . Hepatitis?     hx of dx during ruptured appendix hospitalization  . Irritable bowel syndrome   . Endometriosis     hx of     Past Surgical History  Procedure Laterality Date  . Cholecystectomy    . Abdominal hysterectomy    . Abdominal adhesion surgery      x 7  . Ruptured appendix surgery    . Colon surgery  2010    colon resection due to adhesions  . Partial knee arthroplasty Left 10/27/2013    Procedure: LEFT KNEE UNICOMPARTMENTAL REPLACEMENT ;  Surgeon: Loanne Drilling, MD;  Location: [facility];  Service: Orthopedics;  Laterality: Left;  . Appendectomy    . Tonsillectomy    . Partial knee arthroplasty Right 05/21/2014    Procedure: MEDIAL UNICOMPARTMENTAL RIGHT KNEE ARTHROPLASTY;  Surgeon: Loanne Drilling, MD;  Location: [facility];  Service: Orthopedics;  Laterality: Right;    Social History: non-smoker, does not drink alcohol Work as a Nutritional therapist (field work and teaching)   Allergies  Allergen Reactions  . Lisinopril Cough  . Penicillins Other (See Comments)    Unknown reaction   . Sulfa Antibiotics Nausea And Vomiting  . Dilaudid [Hydromorphone Hcl] Anxiety and Other (See Comments)    paranoia    family history - DM, CAD in father- mother had a "virus" in her body attacking her  heart and lungs- ended up having CHF and COPD,  rheumatoid arthritis and a connective tissue disease  Prior to Admission medications   Medication Sig Start Date End Date Taking? Authorizing Provider  acetaminophen (TYLENOL) 500 MG tablet Take 1,000 mg by mouth every 6 (six) hours as needed for mild pain or moderate pain.    Yes Historical Provider, MD  ADVAIR DISKUS 250-50 MCG/DOSE AEPB Inhale 1 puff into the lungs 2 (two) times daily. 05/31/14  Yes Historical Provider, MD  ALPRAZolam Prudy Feeler) 0.5 MG tablet Take 0.5 mg by mouth 3 (three) times daily as needed. Anxiety   Yes Historical Provider, MD  cetirizine (ZYRTEC) 10 MG tablet Take 10 mg by mouth daily.   Yes Historical Provider, MD  COMBIVENT RESPIMAT 20-100 MCG/ACT AERS respimat Inhale 1 puff into the lungs 3 (three) times daily. 06/11/14  Yes Historical Provider, MD  doxycycline (ADOXA) 100 MG tablet Take 1 tablet by mouth 2 (two) times daily. For incision infection 06/14/14  Yes Historical Provider, MD  fluticasone (FLONASE) 50 MCG/ACT nasal spray Place 1 spray into both nostrils daily as needed. For allergy nasal congestion 05/31/14  Yes Historical Provider, MD  hydrochlorothiazide (HYDRODIURIL) 25 MG tablet Take 1 tablet by mouth daily as needed. For edema 06/03/14  Yes Historical Provider, MD  HYDROcodone-acetaminophen (NORCO/VICODIN) 5-325 MG per tablet Take 1 tablet by mouth every 6 (six) hours as needed for moderate pain.   Yes Historical Provider, MD  LANTUS SOLOSTAR 100 UNIT/ML Solostar Pen Inject 20 Units into the skin 2 (two) times daily. 06/01/14  Yes Historical Provider, MD  losartan (COZAAR) 100 MG tablet Take 100 mg by mouth every morning.   Yes Historical Provider, MD  montelukast (SINGULAIR) 10 MG tablet Take 10 mg by mouth at bedtime.   Yes Historical Provider, MD  naproxen sodium (ANAPROX) 220 MG tablet Take 220 mg by mouth 2 (two) times daily with a meal.   Yes Historical Provider, MD  omeprazole (PRILOSEC OTC) 20 MG tablet  Take 20 mg by mouth daily.   Yes Historical Provider, MD  rosuvastatin (CRESTOR) 10 MG tablet Take 10 mg by mouth every evening.   Yes Historical Provider, MD  albuterol (PROVENTIL HFA;VENTOLIN HFA) 108 (90 BASE) MCG/ACT inhaler Inhale 1 puff into the lungs every 6 (six) hours as needed for wheezing or shortness of breath.    Historical Provider, MD  methocarbamol (ROBAXIN) 500 MG tablet Take 1 tablet (500 mg total) by mouth every 6 (six) hours as needed for muscle spasms. 05/22/14   Avel Peace, PA-C  oxyCODONE (OXY IR/ROXICODONE) 5 MG immediate release tablet Take 1-2 tablets (5-10 mg total) by mouth every 3 (three) hours as needed for moderate pain, severe pain  or breakthrough pain. 05/22/14   Avel Peace, PA-C  rivaroxaban (XARELTO) 10 MG TABS tablet Take 1 tablet (10 mg total) by mouth daily with breakfast. Take daily for nine more days and then discontinue.  Following completion of the Xarelto, start a 325 mg Aspirin daily for three more weeks. 05/22/14   Avel Peace, PA-C  traMADol (ULTRAM) 50 MG tablet Take 1-2 tablets (50-100 mg total) by mouth every 6 (six) hours as needed (mild pain). 05/22/14   Avel Peace, PA-C     Physical Exam: Filed Vitals:   06/28/14 0721 06/28/14 0730 06/28/14 0800 06/28/14 0946  BP: 157/73 168/76 162/77 165/80  Pulse: 105 106 104 107  Temp:    97.9 F (36.6 C)  TempSrc:    Oral  Resp: 23 23 16 20   Height:    5\' 2"  (1.575 m)  Weight:    101.9 kg (224 lb 10.4 oz)  SpO2: 96% 98% 97% 96%     General: AAo x 3, no distress HEENT: Normocephalic and Atraumatic, Mucous membranes pink                PERRLA; EOM intact; No scleral icterus,                 Nares: Patent, Oropharynx: Clear, Fair Dentition                 Neck: FROM, no cervical lymphadenopathy, thyromegaly, carotid bruit or JVD;  Breasts: deferred CHEST WALL: No tenderness  CHEST: crackles at bases, able to speak in full sentences, pulse ox 94% on room air HEART: Regular rate and rhythm; no  murmurs rubs or gallops  BACK: No kyphosis or scoliosis; no CVA tenderness  ABDOMEN: Positive Bowel Sounds, soft, non-tender; no masses, no organomegaly Rectal Exam: deferred EXTREMITIES: No cyanosis, clubbing, or edema Genitalia: not examined  SKIN:  no rash or ulceration  CNS: Alert and Oriented x 4, Nonfocal exam, CN 2-12 intact  Labs on Admission:  Basic Metabolic Panel:  Recent Labs Lab 06/28/14 0346  NA 137  K 4.7  CL 102  CO2 22  GLUCOSE 310*  BUN 20  CREATININE 0.89  CALCIUM 9.2   Liver Function Tests:  Recent Labs Lab 06/28/14 0346  AST 16  ALT 9  ALKPHOS 92  BILITOT 0.6  PROT 7.7  ALBUMIN 3.4*   No results for input(s): LIPASE, AMYLASE in the last 168 hours. No results for input(s): AMMONIA in the last 168 hours. CBC:  Recent Labs Lab 06/28/14 0346  WBC 11.9*  NEUTROABS 11.0*  HGB 10.0*  HCT 31.9*  MCV 87.2  PLT 339   Cardiac Enzymes:  Recent Labs Lab 06/28/14 0346  TROPONINI <0.30    BNP (last 3 results)  Recent Labs  06/28/14 0346  PROBNP 2230.0*   CBG: No results for input(s): GLUCAP in the last 168 hours.  Radiological Exams on Admission: Dg Chest 2 View  06/28/2014   CLINICAL DATA:  Shortness of breath  EXAM: CHEST  2 VIEW  COMPARISON:  03/02/2014  FINDINGS: There is borderline cardiomegaly. Aortic and hilar contours are similar to previous. Diffuse interstitial opacity with Kerley lines. Possible trace pleural effusions. No asymmetric opacity. No pneumothorax.  IMPRESSION: Pulmonary interstitial edema. If there are infectious symptoms, note that atypical infection could also give this appearance.   Electronically Signed   By: Tiburcio Pea M.D.   On: 06/28/2014 04:12   Ct Angio Chest Pe W/cm &/or Wo Cm  06/28/2014   CLINICAL  DATA:  58 year old female with multiple episodes of shortness of breath over the prior week, worsening while lying flat or while walking. History of knee surgery four and a half weeks ago. No associated  chest pain.  EXAM: CT ANGIOGRAPHY CHEST WITH CONTRAST  TECHNIQUE: Multidetector CT imaging of the chest was performed using the standard protocol during bolus administration of intravenous contrast. Multiplanar CT image reconstructions and MIPs were obtained to evaluate the vascular anatomy.  CONTRAST:  OMNIPAQUE IOHEXOL 350 MG/ML SOLN  COMPARISON:  No priors.  FINDINGS: Mediastinum: No filling defects within the pulmonary arterial tree to suggest underlying pulmonary embolism. Heart size is mildly enlarged with mild left ventricular dilatation. There is no significant pericardial fluid, thickening or pericardial calcification. Multiple borderline enlarged and mildly enlarged mediastinal and bilateral hilar lymph nodes (right greater than left), measuring up to 13 mm in short axis in the low left paratracheal station. Esophagus is unremarkable in appearance. Atherosclerosis of the thoracic aorta and great vessels of the mediastinum. No coronary artery calcifications identified at this time.  Lungs/Pleura: Small bilateral pleural effusions layering dependently are simple in appearance. There is a background of mild diffuse interlobular septal thickening, the appearance of which suggests underlying interstitial pulmonary edema. There is some dependent subsegmental passive atelectasis in the lower lobes of the lungs bilaterally. No acute consolidative airspace disease. No suspicious appearing pulmonary nodules or masses.  Upper Abdomen: Status post cholecystectomy.  Atherosclerosis.  Musculoskeletal: There are no aggressive appearing lytic or blastic lesions noted in the visualized portions of the skeleton.  Review of the MIP images confirms the above findings.  IMPRESSION: 1. No evidence of pulmonary embolism. 2. There is mild cardiomegaly with mild left ventricular dilatation, evidence of mild interstitial pulmonary edema in the lungs, and small bilateral pleural effusions which are simple in appearance  layering dependently. Findings are most compatible with congestive heart failure. Clinical correlation is recommended. 3. Borderline enlarged and mildly enlarged mediastinal and bilateral hilar lymph nodes are nonspecific, but can be commonly seen in the setting of congestive heart failure. 4. Atherosclerosis. 5. Status post cholecystectomy.   Electronically Signed   By: Trudie Reed M.D.   On: 06/28/2014 07:09    EKG: Independently reviewed. Sinus tachycardia  Assessment/Plan Principal Problem:   Acute CHF- unspecified -Likely secondary to long-term uncontrolled hypertension with a possibility of ischemia as well-risk factors include hypertension, hyperlipidemia, diabetes mellitus and obesity -CT of the chest negative for PE but reveals mild cardiomegaly with LV dilatation, mild interstitial pulmonary edema and small pleural effusions -Symptoms are quite significant with shortness of breath on talking and orthopnea therefore, will admit her to the hospital - obtain ECHO - given 20 mg of IV Lasix in ER and pt is voiding frequently therefore, will maintain this dose and order it TID -Add hydralazine for blood pressure control-continue losartan - hold HCTZ -Cardiology consult requested as she will likely need an ischemic eval  - Active Problems:    Hypertension -As mentioned above, add hydralazine, continue losartan and hold HCTZ  Borderline enlarged mediastinal and bilateral hilar nodes -May be secondary to heart failure- may need to repeat CT in a few weeks    Hyperlipemia -Continue statin    Acid reflux -Continue PPI    Diabetes mellitus -Willl place on 20 units of Lantus at bedtime although it was originally ordered twice a day - according to the patient, due to improved diet, she has not been using it twice a day. -start sliding scale insulin  with meals and at bedtime    Asthma -Stable -Start dulera instead of Advair-Combivent when necessary only- continue  Singulair  Arthritis with recent right knee replacement -no longer requiring narcotics, tramadol or Xarelto -Continue doxycycline for 2 more days to complete course  Anxiety -Mostly situational due to having a sick brother and father who recently passed away -Continue alprazolam  Obesity -Doing well with weight loss    Consulted: [facility]  Code Status: Full code  Family Communication:   DVT Prophylaxis:Lovenox  Time spent: 60 min  Zuhayr Deeney, MD [facility]  If 7PM-7AM, please contact night-coverage [email or web address] 06/28/2014, 10:49 AM

## 2014-06-28 NOTE — Progress Notes (Signed)
Inpatient Diabetes Program Recommendations  AACE/ADA: New Consensus Statement on Inpatient Glycemic Control (2013)  Target Ranges:  Prepandial:   less than 140 mg/dL      Peak postprandial:   less than 180 mg/dL (1-2 hours)      Critically ill patients:  140 - 180 mg/dL  Results for TANYELL, BRISCOE (MRN [id number]) as of 06/28/2014 14:40  Ref. Range 06/28/2014 03:46  Glucose Latest Range: 70-99 mg/dL 350 (H)   Inpatient Diabetes Program Recommendations Correction (SSI): add Novolog moderate scale TID + HS per Glycemic Control Order set  Thank you  Piedad Climes BSN, RN,CDE Inpatient Diabetes Coordinator [phone] (team pager)

## 2014-06-28 NOTE — Progress Notes (Signed)
06/28/2014 10:45 PM Pt had a blood glucose of 311 and she had no sliding scale ordered.  She was given 20 Units of Lantus as scheduled.  Dr. Craige Cotta was paged and asked if pt should be given humalog insulin.  Dr. Craige Cotta informed that she put up a humalog insulin sliding scale for before meals and at bed time.  Will continue to monitor patient. Harriet Masson

## 2014-06-28 NOTE — ED Provider Notes (Signed)
CSN: [id number]     Arrival date & time 06/28/14  3382 History   First MD Initiated Contact with Patient 06/28/14 [phone]     Chief Complaint  Patient presents with  . Shortness of Breath     (Consider location/radiation/quality/duration/timing/severity/associated sxs/prior Treatment) HPI Comments: PT comes in with cc of dib.  Pt has hx of HTN, HL, DM and PNA and recent knee replacement (4 weeks ago). PT comes in with cc of dib. Symptoms started 5-7 days ago with orthopnea and PND like sx. Pt reports that she gets short of breath with exertion now - just minimal. Pt had bilateral PNA in July, with similar sx. + mild cough, non productive. No fevers, or chills.  Patient is a 58 y.o. female presenting with shortness of breath. The history is provided by the patient.  Shortness of Breath Associated symptoms: cough and wheezing   Associated symptoms: no abdominal pain, no chest pain, no neck pain and no vomiting     Past Medical History  Diagnosis Date  . Diabetes mellitus   . Hypertension   . Hyperlipemia   . Acid reflux   . Anxiety   . Obesity   . Asthma     infrequent problems  . Arthritis   . Rash     sides of neck  . Stress incontinence   . Complication of anesthesia     "I quit breathing during surgery" 2008 - has had surgery since with no problems   . PONV (postoperative nausea and vomiting)   . Pneumonia     June 2015  . Tuberculosis     hx of 1985  . Headache     hx of migraines  . Hepatitis     hx of dx during ruptured appendix hospitalization  . Irritable bowel syndrome   . Endometriosis     hx of    Past Surgical History  Procedure Laterality Date  . Cholecystectomy    . Abdominal hysterectomy    . Abdominal adhesion surgery      x 7  . Ruptured appendix surgery    . Colon surgery  2010    colon resection due to adhesions  . Partial knee arthroplasty Left 10/27/2013    Procedure: LEFT KNEE UNICOMPARTMENTAL REPLACEMENT ;  Surgeon: Loanne Drilling, MD;   Location: [facility];  Service: Orthopedics;  Laterality: Left;  . Appendectomy    . Tonsillectomy    . Partial knee arthroplasty Right 05/21/2014    Procedure: MEDIAL UNICOMPARTMENTAL RIGHT KNEE ARTHROPLASTY;  Surgeon: Loanne Drilling, MD;  Location: [facility];  Service: Orthopedics;  Laterality: Right;   No family history on file. History  Substance Use Topics  . Smoking status: Never Smoker   . Smokeless tobacco: Never Used  . Alcohol Use: Yes     Comment: occ   OB History    No data available     Review of Systems  Constitutional: Negative for activity change.  HENT: Negative for facial swelling.   Respiratory: Positive for cough, chest tightness, shortness of breath and wheezing.   Cardiovascular: Negative for chest pain.  Gastrointestinal: Negative for nausea, vomiting, abdominal pain, diarrhea, constipation, blood in stool and abdominal distention.  Genitourinary: Negative for hematuria and difficulty urinating.  Musculoskeletal: Negative for neck pain.  Skin: Negative for color change.  Neurological: Negative for speech difficulty.  Hematological: Does not bruise/bleed easily.  Psychiatric/Behavioral: Negative for confusion.  All other systems reviewed and are negative.  Allergies  Lisinopril; Penicillins; Sulfa antibiotics; and Dilaudid  Home Medications   Prior to Admission medications   Medication Sig Start Date End Date Taking? Authorizing Provider  acetaminophen (TYLENOL) 500 MG tablet Take 1,000 mg by mouth every 6 (six) hours as needed for mild pain or moderate pain.    Yes Historical Provider, MD  ADVAIR DISKUS 250-50 MCG/DOSE AEPB Inhale 1 puff into the lungs 2 (two) times daily. 05/31/14  Yes Historical Provider, MD  ALPRAZolam Prudy Feeler(XANAX) 0.5 MG tablet Take 0.5 mg by mouth 3 (three) times daily as needed. Anxiety   Yes Historical Provider, MD  cetirizine (ZYRTEC) 10 MG tablet Take 10 mg by mouth daily.   Yes Historical Provider, MD  COMBIVENT RESPIMAT 20-100  MCG/ACT AERS respimat Inhale 1 puff into the lungs 3 (three) times daily. 06/11/14  Yes Historical Provider, MD  doxycycline (ADOXA) 100 MG tablet Take 1 tablet by mouth 2 (two) times daily. For incision infection 06/14/14  Yes Historical Provider, MD  fluticasone (FLONASE) 50 MCG/ACT nasal spray Place 1 spray into both nostrils daily as needed. For allergy nasal congestion 05/31/14  Yes Historical Provider, MD  hydrochlorothiazide (HYDRODIURIL) 25 MG tablet Take 1 tablet by mouth daily as needed. For edema 06/03/14  Yes Historical Provider, MD  HYDROcodone-acetaminophen (NORCO/VICODIN) 5-325 MG per tablet Take 1 tablet by mouth every 6 (six) hours as needed for moderate pain.   Yes Historical Provider, MD  LANTUS SOLOSTAR 100 UNIT/ML Solostar Pen Inject 20 Units into the skin 2 (two) times daily. 06/01/14  Yes Historical Provider, MD  losartan (COZAAR) 100 MG tablet Take 100 mg by mouth every morning.   Yes Historical Provider, MD  montelukast (SINGULAIR) 10 MG tablet Take 10 mg by mouth at bedtime.   Yes Historical Provider, MD  naproxen sodium (ANAPROX) 220 MG tablet Take 220 mg by mouth 2 (two) times daily with a meal.   Yes Historical Provider, MD  omeprazole (PRILOSEC OTC) 20 MG tablet Take 20 mg by mouth daily.   Yes Historical Provider, MD  rosuvastatin (CRESTOR) 10 MG tablet Take 10 mg by mouth every evening.   Yes Historical Provider, MD  albuterol (PROVENTIL HFA;VENTOLIN HFA) 108 (90 BASE) MCG/ACT inhaler Inhale 1 puff into the lungs every 6 (six) hours as needed for wheezing or shortness of breath.    Historical Provider, MD  methocarbamol (ROBAXIN) 500 MG tablet Take 1 tablet (500 mg total) by mouth every 6 (six) hours as needed for muscle spasms. 05/22/14   Avel Peacerew Perkins, PA-C  oxyCODONE (OXY IR/ROXICODONE) 5 MG immediate release tablet Take 1-2 tablets (5-10 mg total) by mouth every 3 (three) hours as needed for moderate pain, severe pain or breakthrough pain. 05/22/14   Avel Peacerew Perkins, PA-C   rivaroxaban (XARELTO) 10 MG TABS tablet Take 1 tablet (10 mg total) by mouth daily with breakfast. Take daily for nine more days and then discontinue.  Following completion of the Xarelto, start a 325 mg Aspirin daily for three more weeks. 05/22/14   Avel Peacerew Perkins, PA-C  traMADol (ULTRAM) 50 MG tablet Take 1-2 tablets (50-100 mg total) by mouth every 6 (six) hours as needed (mild pain). 05/22/14   Avel Peacerew Perkins, PA-C   BP 186/116 mmHg  Pulse 105  Temp(Src) 98 F (36.7 C) (Oral)  Resp 17  SpO2 96% Physical Exam  Constitutional: She is oriented to person, place, and time. She appears well-developed and well-nourished.  HENT:  Head: Normocephalic and atraumatic.  Eyes: EOM are normal. Pupils are equal,  round, and reactive to light.  Neck: Neck supple.  Cardiovascular: Normal rate, regular rhythm and normal heart sounds.   No murmur heard. Pulmonary/Chest: Effort normal. No respiratory distress.  Abdominal: Soft. She exhibits no distension. There is no tenderness. There is no rebound and no guarding.  Neurological: She is alert and oriented to person, place, and time.  Skin: Skin is warm and dry.  Nursing note and vitals reviewed.   ED Course  Procedures (including critical care time) Labs Review Labs Reviewed  CBC WITH DIFFERENTIAL - Abnormal; Notable for the following:    WBC 11.9 (*)    RBC 3.66 (*)    Hemoglobin 10.0 (*)    HCT 31.9 (*)    Neutrophils Relative % 93 (*)    Neutro Abs 11.0 (*)    Lymphocytes Relative 6 (*)    Monocytes Relative 1 (*)    All other components within normal limits  COMPREHENSIVE METABOLIC PANEL - Abnormal; Notable for the following:    Glucose, Bld 310 (*)    Albumin 3.4 (*)    GFR calc non Af Amer 70 (*)    GFR calc Af Amer 81 (*)    All other components within normal limits  PRO B NATRIURETIC PEPTIDE - Abnormal; Notable for the following:    Pro B Natriuretic peptide (BNP) 2230.0 (*)    All other components within normal limits  D-DIMER,  QUANTITATIVE - Abnormal; Notable for the following:    D-Dimer, Quant 3.07 (*)    All other components within normal limits  TROPONIN I    Imaging Review Dg Chest 2 View  06/28/2014   CLINICAL DATA:  Shortness of breath  EXAM: CHEST  2 VIEW  COMPARISON:  03/02/2014  FINDINGS: There is borderline cardiomegaly. Aortic and hilar contours are similar to previous. Diffuse interstitial opacity with Kerley lines. Possible trace pleural effusions. No asymmetric opacity. No pneumothorax.  IMPRESSION: Pulmonary interstitial edema. If there are infectious symptoms, note that atypical infection could also give this appearance.   Electronically Signed   By: Tiburcio Pea M.D.   On: 06/28/2014 04:12   Ct Angio Chest Pe W/cm &/or Wo Cm  06/28/2014   CLINICAL DATA:  58 year old female with multiple episodes of shortness of breath over the prior week, worsening while lying flat or while walking. History of knee surgery four and a half weeks ago. No associated chest pain.  EXAM: CT ANGIOGRAPHY CHEST WITH CONTRAST  TECHNIQUE: Multidetector CT imaging of the chest was performed using the standard protocol during bolus administration of intravenous contrast. Multiplanar CT image reconstructions and MIPs were obtained to evaluate the vascular anatomy.  CONTRAST:  OMNIPAQUE IOHEXOL 350 MG/ML SOLN  COMPARISON:  No priors.  FINDINGS: Mediastinum: No filling defects within the pulmonary arterial tree to suggest underlying pulmonary embolism. Heart size is mildly enlarged with mild left ventricular dilatation. There is no significant pericardial fluid, thickening or pericardial calcification. Multiple borderline enlarged and mildly enlarged mediastinal and bilateral hilar lymph nodes (right greater than left), measuring up to 13 mm in short axis in the low left paratracheal station. Esophagus is unremarkable in appearance. Atherosclerosis of the thoracic aorta and great vessels of the mediastinum. No coronary artery  calcifications identified at this time.  Lungs/Pleura: Small bilateral pleural effusions layering dependently are simple in appearance. There is a background of mild diffuse interlobular septal thickening, the appearance of which suggests underlying interstitial pulmonary edema. There is some dependent subsegmental passive atelectasis in the lower lobes  of the lungs bilaterally. No acute consolidative airspace disease. No suspicious appearing pulmonary nodules or masses.  Upper Abdomen: Status post cholecystectomy.  Atherosclerosis.  Musculoskeletal: There are no aggressive appearing lytic or blastic lesions noted in the visualized portions of the skeleton.  Review of the MIP images confirms the above findings.  IMPRESSION: 1. No evidence of pulmonary embolism. 2. There is mild cardiomegaly with mild left ventricular dilatation, evidence of mild interstitial pulmonary edema in the lungs, and small bilateral pleural effusions which are simple in appearance layering dependently. Findings are most compatible with congestive heart failure. Clinical correlation is recommended. 3. Borderline enlarged and mildly enlarged mediastinal and bilateral hilar lymph nodes are nonspecific, but can be commonly seen in the setting of congestive heart failure. 4. Atherosclerosis. 5. Status post cholecystectomy.   Electronically Signed   By: Trudie Reed M.D.   On: 06/28/2014 07:09     EKG Interpretation   Date/Time:  Thursday June 28 2014 03:22:41 EST Ventricular Rate:  114 PR Interval:  154 QRS Duration: 88 QT Interval:  348 QTC Calculation: 479 R Axis:   72 Text Interpretation:  Sinus tachycardia Otherwise normal ECG No  significant change since last tracing Confirmed by Britteney Ayotte, MD, Jashanti Clinkscale  [phone]) on 06/28/2014 3:41:25 AM      MDM   Final diagnoses:  Dyspnea  Acute systolic congestive heart failure    Pt comes in with cc of dyspnea. Pt has orthopnea and PND. She on lung exam has pretty clear  breath sounds though - just bibasilar crackles. Clinically, no pneumonia either. Suspect CHF as the likely cause - and it would be a new diagnosis. PT has had recent knee replacement, bilateral, one after another. Concerns for PE, as she is noted to be tachycardic and a bit winded just while talking to me. Pt has no diagnosis of CHF. Dimer is elevated.  Will get CT PE.   7:23 AM Pt has BNP elevation, CT PE is neg for PE, but + for CHF. Will admit for new onset CHF.    Derwood Kaplan, MD 06/28/14 [phone]

## 2014-06-28 NOTE — Progress Notes (Signed)
UR Completed.  [phone]  

## 2014-06-28 NOTE — Consult Note (Signed)
CARDIOLOGY CONSULT NOTE  Patient ID: Shelly [name] MRN: [id number] DOB/AGE: [birth year] 58 y.o.  Admit date: 06/28/2014 Primary Physician Lupita Raider, MD Primary Cardiologist None Chief Complaint  Dyspnea  HPI:  The patient has no past cardiac history.  She has had HTN.  She has cardiovascular risk factors as below.  She has had morbid obesity but over five years she has lost 100 lbs.  She was treated for pneumonia this year at an urgent care.  she had knee surgery in Nov.  For the past several days she thought that she might be getting recurrent pneumonia.  She felt SOB particularly lying flat at night with positive PND.  She was not having cough, fevers, chills, weight gain or edema however.  She had never really had these symptoms before.  She denied chest pain, neck or arm pain.  Chest CT and CSR did demonstrate some pulmonary edema.  ProBNP was 2230.  Troponin x 1 was negative.  Her symptoms were particularly acute last night and so she came to the ED.  She reports that lying flat she hears a crackling.  She otherwise had been feeling OK.  She has been able to walk at work and do her daily activities without excessive SOB.    Past Medical History  Diagnosis Date  . Diabetes mellitus   . Hypertension   . Hyperlipemia   . Acid reflux   . Anxiety   . Obesity   . Asthma     infrequent problems  . Arthritis   . Rash     sides of neck  . Stress incontinence   . Complication of anesthesia     "I quit breathing during surgery" 2008 - has had surgery since with no problems   . PONV (postoperative nausea and vomiting)   . Pneumonia     June 2015  . Tuberculosis     hx of 1985  . Headache     hx of migraines  . Hepatitis     hx of dx during ruptured appendix hospitalization  . Irritable bowel syndrome   . Endometriosis     hx of     Past Surgical History  Procedure Laterality Date  . Cholecystectomy    . Abdominal hysterectomy    . Abdominal adhesion surgery      x 7    . Ruptured appendix surgery    . Colon surgery  2010    colon resection due to adhesions  . Partial knee arthroplasty Left 10/27/2013    Procedure: LEFT KNEE UNICOMPARTMENTAL REPLACEMENT ;  Surgeon: Loanne Drilling, MD;  Location: [facility];  Service: Orthopedics;  Laterality: Left;  . Appendectomy    . Tonsillectomy    . Partial knee arthroplasty Right 05/21/2014    Procedure: MEDIAL UNICOMPARTMENTAL RIGHT KNEE ARTHROPLASTY;  Surgeon: Loanne Drilling, MD;  Location: [facility];  Service: Orthopedics;  Laterality: Right;    Allergies  Allergen Reactions  . Lisinopril Cough  . Penicillins Other (See Comments)    Unknown reaction   . Sulfa Antibiotics Nausea And Vomiting  . Dilaudid [Hydromorphone Hcl] Anxiety and Other (See Comments)    paranoia   Prescriptions prior to admission  Medication Sig Dispense Refill Last Dose  . acetaminophen (TYLENOL) 500 MG tablet Take 1,000 mg by mouth every 6 (six) hours as needed for mild pain or moderate pain.    06/27/2014 at Unknown time  . ADVAIR DISKUS 250-50 MCG/DOSE AEPB Inhale 1 puff into the lungs  2 (two) times daily.   06/27/2014 at Unknown time  . ALPRAZolam (XANAX) 0.5 MG tablet Take 0.5 mg by mouth 3 (three) times daily as needed. Anxiety   06/27/2014 at Unknown time  . cetirizine (ZYRTEC) 10 MG tablet Take 10 mg by mouth daily.   06/27/2014 at Unknown time  . COMBIVENT RESPIMAT 20-100 MCG/ACT AERS respimat Inhale 1 puff into the lungs 3 (three) times daily.   06/27/2014 at Unknown time  . doxycycline (ADOXA) 100 MG tablet Take 1 tablet by mouth 2 (two) times daily. For incision infection   06/27/2014 at Unknown time  . fluticasone (FLONASE) 50 MCG/ACT nasal spray Place 1 spray into both nostrils daily as needed. For allergy nasal congestion   06/27/2014 at Unknown time  . hydrochlorothiazide (HYDRODIURIL) 25 MG tablet Take 1 tablet by mouth daily as needed. For edema   06/27/2014 at Unknown time  . HYDROcodone-acetaminophen (NORCO/VICODIN) 5-325 MG  per tablet Take 1 tablet by mouth every 6 (six) hours as needed for moderate pain.   unknown  . LANTUS SOLOSTAR 100 UNIT/ML Solostar Pen Inject 20 Units into the skin 2 (two) times daily.   06/27/2014 at Unknown time  . losartan (COZAAR) 100 MG tablet Take 100 mg by mouth every morning.   06/27/2014 at Unknown time  . montelukast (SINGULAIR) 10 MG tablet Take 10 mg by mouth at bedtime.   06/27/2014 at Unknown time  . naproxen sodium (ANAPROX) 220 MG tablet Take 220 mg by mouth 2 (two) times daily with a meal.   06/27/2014 at Unknown time  . omeprazole (PRILOSEC OTC) 20 MG tablet Take 20 mg by mouth daily.   06/27/2014 at Unknown time  . rosuvastatin (CRESTOR) 10 MG tablet Take 10 mg by mouth every evening.   06/27/2014 at Unknown time  . albuterol (PROVENTIL HFA;VENTOLIN HFA) 108 (90 BASE) MCG/ACT inhaler Inhale 1 puff into the lungs every 6 (six) hours as needed for wheezing or shortness of breath.   unknown  . methocarbamol (ROBAXIN) 500 MG tablet Take 1 tablet (500 mg total) by mouth every 6 (six) hours as needed for muscle spasms. 80 tablet 0   . oxyCODONE (OXY IR/ROXICODONE) 5 MG immediate release tablet Take 1-2 tablets (5-10 mg total) by mouth every 3 (three) hours as needed for moderate pain, severe pain or breakthrough pain. 80 tablet 0   . rivaroxaban (XARELTO) 10 MG TABS tablet Take 1 tablet (10 mg total) by mouth daily with breakfast. Take daily for nine more days and then discontinue.  Following completion of the Xarelto, start a 325 mg Aspirin daily for three more weeks. 9 tablet 0   . traMADol (ULTRAM) 50 MG tablet Take 1-2 tablets (50-100 mg total) by mouth every 6 (six) hours as needed (mild pain). 60 tablet 1    No family history on file.  History   Social History  . Marital Status: Legally Separated    Spouse Name: N/A    Number of Children: N/A  . Years of Education: N/A   Occupational History  . Not on file.   Social History Main Topics  . Smoking status: Never Smoker     . Smokeless tobacco: Never Used  . Alcohol Use: Yes     Comment: occ  . Drug Use: No  . Sexual Activity: Not on file   Other Topics Concern  . Not on file   Social History Narrative     ROS:    As stated in the HPI and  negative for all other systems.  Physical Exam: Blood pressure 165/80, pulse 107, temperature 97.9 F (36.6 C), temperature source Oral, resp. rate 20, height 5\' 2"  (1.575 m), weight 224 lb 10.4 oz (101.9 kg), SpO2 96 %.  GENERAL:  Well appearing HEENT:  Pupils equal round and reactive, fundi not visualized, oral mucosa unremarkable NECK:  No jugular venous distention, waveform within normal limits, carotid upstroke brisk and symmetric, no bruits, no thyromegaly LYMPHATICS:  No cervical, inguinal adenopathy LUNGS:  Left greater than right basilar crackles. BACK:  No CVA tenderness CHEST:  Unremarkable HEART:  PMI not displaced or sustained,S1 and S2 within normal limits, no S3, no S4, no clicks, no rubs, no murmurs ABD:  Flat, positive bowel sounds normal in frequency in pitch, no bruits, no rebound, no guarding, no midline pulsatile mass, no hepatomegaly, no splenomegaly EXT:  2 plus pulses throughout, no edema, no cyanosis no clubbing SKIN:  No rashes no nodules NEURO:  Cranial nerves II through XII grossly intact, motor grossly intact throughout PSYCH:  Cognitively intact, oriented to person place and time   Labs: Lab Results  Component Value Date   BUN 20 06/28/2014   Lab Results  Component Value Date   CREATININE 0.89 06/28/2014   Lab Results  Component Value Date   NA 137 06/28/2014   K 4.7 06/28/2014   CL 102 06/28/2014   CO2 22 06/28/2014   Lab Results  Component Value Date   TROPONINI <0.30 06/28/2014   Lab Results  Component Value Date   WBC 11.9* 06/28/2014   HGB 10.0* 06/28/2014   HCT 31.9* 06/28/2014   MCV 87.2 06/28/2014   PLT 339 06/28/2014   Lab Results  Component Value Date   ALT 9 06/28/2014   AST 16 06/28/2014    ALKPHOS 92 06/28/2014   BILITOT 0.6 06/28/2014     Radiology:   CXR:   There is borderline cardiomegaly. Aortic and hilar contours are similar to previous. Diffuse interstitial opacity with Kerley lines. Possible trace pleural effusions. No asymmetric opacity. No pneumothorax.  EKG:  Sinus tachycardia, rate 114, axis within NL, QTc prolonged.  Poor anterior R wave progression.  No acute ST T wave changes.  06/28/2014  ASSESSMENT AND PLAN:    ACUTE SYSTOLIC HF:  Moderately severe reduction in EF which is a new finding.  Some MR.   Continue IV diuresis.  I think that we can start a low dose of bisoprolol (try this secondary to asthma).  She will need left heart cath to exclude ischemia.  Right heart to measure pressures.  Final echo results pending.  Continue current dose of ARB.  She is allergic to ACE inhibitor.   HTN:  This is being managed in the context of treating his CHF.   SignedRollene Rotunda 06/28/2014, 10:49 AM

## 2014-06-28 NOTE — ED Notes (Signed)
Pt. reports progressing SOB worse with exertion onset last week , denies fever or chills, no cough or congestion , pt. stated symptoms similar to her previous pneumonia several months ago .

## 2014-06-28 NOTE — Plan of Care (Signed)
Problem: Phase I Progression Outcomes Goal: EF % per last Echo/documented,Core Reminder form on chart Outcome: Completed/Met Date Met:  06/28/14 EF 25-30%

## 2014-06-28 NOTE — Progress Notes (Signed)
  Echocardiogram 2D Echocardiogram has been performed.  Shelly [name] 06/28/2014, 11:19 AM

## 2014-06-28 NOTE — ED Notes (Signed)
Admitting MD at bedside.

## 2014-06-28 NOTE — Progress Notes (Signed)
06/28/2014 11:14 PM Pt needs 2nd IV for cath in AM. RN tried to put in IV once. A second nurse looked at pt's arms for veins. Will get IV consult for hard stick. Harriet Masson

## 2014-06-28 NOTE — ED Notes (Signed)
Pt speaking with multiple pauses, states she was on doxycycline for about two weeks. Prednisone and breathing treatment PTA at [facility].

## 2014-06-29 ENCOUNTER — Encounter (HOSPITAL_COMMUNITY)
Admission: EM | Disposition: A | Discharge status: Home Health | Payer: Self-pay | Source: Home / Self Care | Attending: Internal Medicine

## 2014-06-29 ENCOUNTER — Encounter (HOSPITAL_COMMUNITY): Payer: Self-pay | Admitting: Interventional Cardiology

## 2014-06-29 DIAGNOSIS — R0602 Shortness of breath: Secondary | ICD-10-CM

## 2014-06-29 DIAGNOSIS — I5021 Acute systolic (congestive) heart failure: Principal | ICD-10-CM

## 2014-06-29 HISTORY — PX: LEFT AND RIGHT HEART CATHETERIZATION WITH CORONARY ANGIOGRAM: SHX5449

## 2014-06-29 LAB — BASIC METABOLIC PANEL
ANION GAP: 13 (ref 5–15)
BUN: 22 mg/dL (ref 6–23)
CO2: 28 mEq/L (ref 19–32)
Calcium: 9.3 mg/dL (ref 8.4–10.5)
Chloride: 100 mEq/L (ref 96–112)
Creatinine, Ser: 1.02 mg/dL (ref 0.50–1.10)
GFR calc Af Amer: 69 mL/min — ABNORMAL LOW (ref >90)
GFR, EST NON AFRICAN AMERICAN: 59 mL/min — AB (ref >90)
Glucose, Bld: 174 mg/dL — ABNORMAL HIGH (ref 70–99)
POTASSIUM: 3.6 meq/L — AB (ref 3.7–5.3)
Sodium: 141 mEq/L (ref 137–147)

## 2014-06-29 LAB — GLUCOSE, CAPILLARY
Glucose-Capillary: 159 mg/dL — ABNORMAL HIGH (ref 70–99)
Glucose-Capillary: 156 mg/dL — ABNORMAL HIGH (ref 70–99)
Glucose-Capillary: 246 mg/dL — ABNORMAL HIGH (ref 70–99)
Glucose-Capillary: 185 mg/dL — ABNORMAL HIGH (ref 70–99)

## 2014-06-29 LAB — PROTIME-INR
INR: 1.17 (ref 0.00–1.49)
Prothrombin Time: 15.1 seconds (ref 11.6–15.2)

## 2014-06-29 LAB — POCT I-STAT 3, ART BLOOD GAS (G3+)
Acid-Base Excess: 2 mmol/L (ref 0.0–2.0)
BICARBONATE: 27 meq/L — AB (ref 20.0–24.0)
O2 SAT: 93 %
PO2 ART: 66 mmHg — AB (ref 80.0–100.0)
TCO2: 28 mmol/L (ref 0–100)
pCO2 arterial: 41.7 mmHg (ref 35.0–45.0)
pH, Arterial: 7.419 (ref 7.350–7.450)

## 2014-06-29 LAB — CBC
HCT: 30.1 % — ABNORMAL LOW (ref 36.0–46.0)
Hemoglobin: 9.6 g/dL — ABNORMAL LOW (ref 12.0–15.0)
MCH: 27.6 pg (ref 26.0–34.0)
MCHC: 31.9 g/dL (ref 30.0–36.0)
MCV: 86.5 fL (ref 78.0–100.0)
PLATELETS: 320 10*3/uL (ref 150–400)
RBC: 3.48 MIL/uL — ABNORMAL LOW (ref 3.87–5.11)
RDW: 14.8 % (ref 11.5–15.5)
WBC: 12.1 10*3/uL — ABNORMAL HIGH (ref 4.0–10.5)

## 2014-06-29 LAB — POCT I-STAT 3, VENOUS BLOOD GAS (G3P V)
ACID-BASE EXCESS: 4 mmol/L — AB (ref 0.0–2.0)
BICARBONATE: 29.5 meq/L — AB (ref 20.0–24.0)
O2 Saturation: 62 %
TCO2: 31 mmol/L (ref 0–100)
pCO2, Ven: 46.7 mmHg (ref 45.0–50.0)
pH, Ven: 7.408 — ABNORMAL HIGH (ref 7.250–7.300)
pO2, Ven: 32 mmHg (ref 30.0–45.0)

## 2014-06-29 LAB — POCT ACTIVATED CLOTTING TIME: Activated Clotting Time: 171 seconds

## 2014-06-29 SURGERY — LEFT AND RIGHT HEART CATHETERIZATION WITH CORONARY ANGIOGRAM
Anesthesia: LOCAL

## 2014-06-29 MED ORDER — OXYCODONE HCL 5 MG PO TABS
5.0000 mg | ORAL_TABLET | ORAL | Status: DC | PRN
  Administered 2014-06-29: 10 mg via ORAL
  Filled 2014-06-29: qty 2

## 2014-06-29 MED ORDER — SODIUM CHLORIDE 0.9 % IV SOLN
INTRAVENOUS | Status: AC
  Administered 2014-06-29: 10:00:00 via INTRAVENOUS

## 2014-06-29 MED ORDER — METHOCARBAMOL 500 MG PO TABS
500.0000 mg | ORAL_TABLET | Freq: Four times a day (QID) | ORAL | Status: DC | PRN
  Filled 2014-06-29: qty 1

## 2014-06-29 MED ORDER — ACETAMINOPHEN 325 MG PO TABS: 650.0000 mg | ORAL_TABLET | ORAL | Status: DC | PRN

## 2014-06-29 MED ORDER — PROMETHAZINE HCL 25 MG/ML IJ SOLN
25.0000 mg | Freq: Four times a day (QID) | INTRAMUSCULAR | Status: DC | PRN
  Administered 2014-06-29: 25 mg via INTRAVENOUS
  Filled 2014-06-29: qty 1

## 2014-06-29 MED ORDER — MIDAZOLAM HCL 2 MG/2ML IJ SOLN
INTRAMUSCULAR | Status: AC
  Filled 2014-06-29: qty 2

## 2014-06-29 MED ORDER — ALBUTEROL SULFATE HFA 108 (90 BASE) MCG/ACT IN AERS: 1.0000 | INHALATION_SPRAY | Freq: Four times a day (QID) | RESPIRATORY_TRACT | Status: DC | PRN

## 2014-06-29 MED ORDER — HYDROCODONE-ACETAMINOPHEN 5-325 MG PO TABS
1.0000 | ORAL_TABLET | Freq: Four times a day (QID) | ORAL | Status: DC | PRN
  Filled 2014-06-29: qty 1

## 2014-06-29 MED ORDER — FENTANYL CITRATE 0.05 MG/ML IJ SOLN
INTRAMUSCULAR | Status: AC
  Filled 2014-06-29: qty 2

## 2014-06-29 MED ORDER — PROMETHAZINE HCL 25 MG PO TABS
25.0000 mg | ORAL_TABLET | Freq: Four times a day (QID) | ORAL | Status: DC | PRN
  Administered 2014-06-30: 25 mg via ORAL
  Filled 2014-06-29: qty 1

## 2014-06-29 MED ORDER — BISOPROLOL FUMARATE 5 MG PO TABS
5.0000 mg | ORAL_TABLET | Freq: Every day | ORAL | Status: DC
  Administered 2014-06-29 – 2014-07-01 (×3): 5 mg via ORAL
  Filled 2014-06-29 (×3): qty 1

## 2014-06-29 MED ORDER — PROMETHAZINE HCL 25 MG RE SUPP
25.0000 mg | Freq: Four times a day (QID) | RECTAL | Status: DC | PRN
  Filled 2014-06-29: qty 1

## 2014-06-29 MED ORDER — ONDANSETRON HCL 4 MG/2ML IJ SOLN
4.0000 mg | Freq: Four times a day (QID) | INTRAMUSCULAR | Status: DC | PRN
  Administered 2014-06-29: 4 mg via INTRAVENOUS
  Filled 2014-06-29: qty 2

## 2014-06-29 MED ORDER — ENOXAPARIN SODIUM 40 MG/0.4ML ~~LOC~~ SOLN
40.0000 mg | SUBCUTANEOUS | Status: DC
  Administered 2014-06-29 – 2014-07-01 (×3): 40 mg via SUBCUTANEOUS
  Filled 2014-06-29 (×3): qty 0.4

## 2014-06-29 MED ORDER — ENOXAPARIN SODIUM 40 MG/0.4ML ~~LOC~~ SOLN
40.0000 mg | SUBCUTANEOUS | Status: DC
Start: 2014-06-29 — End: 2014-06-29

## 2014-06-29 MED ORDER — LIDOCAINE HCL (PF) 1 % IJ SOLN
INTRAMUSCULAR | Status: AC
  Filled 2014-06-29: qty 30

## 2014-06-29 MED ORDER — VERAPAMIL HCL 2.5 MG/ML IV SOLN
INTRAVENOUS | Status: AC
  Filled 2014-06-29: qty 2

## 2014-06-29 MED ORDER — RIVAROXABAN 10 MG PO TABS
10.0000 mg | ORAL_TABLET | Freq: Every day | ORAL | Status: DC
  Filled 2014-06-29: qty 1

## 2014-06-29 MED ORDER — HEPARIN (PORCINE) IN NACL 2-0.9 UNIT/ML-% IJ SOLN
INTRAMUSCULAR | Status: AC
  Filled 2014-06-29: qty 1500

## 2014-06-29 MED ORDER — TRAMADOL HCL 50 MG PO TABS
50.0000 mg | ORAL_TABLET | Freq: Four times a day (QID) | ORAL | Status: DC | PRN
  Administered 2014-06-29 – 2014-06-30 (×2): 100 mg via ORAL
  Filled 2014-06-29 (×2): qty 2

## 2014-06-29 MED ORDER — NITROGLYCERIN 1 MG/10 ML FOR IR/CATH LAB
INTRA_ARTERIAL | Status: AC
  Filled 2014-06-29: qty 10

## 2014-06-29 MED ORDER — HEPARIN SODIUM (PORCINE) 1000 UNIT/ML IJ SOLN
INTRAMUSCULAR | Status: AC
  Filled 2014-06-29: qty 1

## 2014-06-29 MED ORDER — OXYCODONE HCL 5 MG PO TABS: 5.0000 mg | ORAL_TABLET | ORAL | Status: DC | PRN

## 2014-06-29 MED ORDER — ACETAMINOPHEN 500 MG PO TABS
1000.0000 mg | ORAL_TABLET | Freq: Four times a day (QID) | ORAL | Status: DC | PRN
  Administered 2014-06-29 (×2): 1000 mg via ORAL
  Filled 2014-06-29: qty 2

## 2014-06-29 NOTE — Progress Notes (Signed)
Order for sheath removal verified per post procedural orders. Procedure explained to patient and Rt femoral venous access site assessed: level 0, palpable dorsalis pedis and posterior tibial pulses. 7French Sheath removed and manual pressure applied for 10 minutes. Pre, peri, & post procedural vitals: HR 90, RR 20, O2 Sat upper 144, BP 74, Pain 0. Distal pulses remained intact after sheath removal. Access site level 0 and dressed with 4X4 gauze and tegaderm.  Delana Meyer, RN confirmed condition of site. Post procedural instructions discussed with return demonstration from patient.

## 2014-06-29 NOTE — Care Management Note (Addendum)
    Page 1 of 2   07/01/2014     9:50:28 AM CARE MANAGEMENT NOTE 07/01/2014  Patient:  Shelly [name]   Account Number:  [id number]  Date Initiated:  06/29/2014  Documentation initiated by:  MAYO,HENRIETTA  Subjective/Objective Assessment:   dx systolic failure    PCP  Shelly [name]     Action/Plan:   discharge planning   Anticipated DC Date:  07/01/2014   Anticipated DC Plan:  [place]  CM consult      [facility]  HOME HEALTH   Choice offered to / List presented to:  C-1 Patient   DME arranged  OTHER - SEE COMMENT        HH arranged  HH-1 RN  [place]   Status of service:  Completed, signed off Medicare Important Message given?   (If response is "NO", the following Medicare IM given date fields will be blank) Date Medicare IM given:   Medicare IM given by:   Date Additional Medicare IM given:   Additional Medicare IM given by:    Discharge Disposition:  [place]  Per UR Regulation:  Reviewed for med. necessity/level of care/duration of stay  If discussed at [place] of Stay Meetings, dates discussed:    Comments:  07/01/14 08:00 CM met with pt and gave pt [place] and pt verbalizes understanding she is to go to the gift shop today as she is being discharged and give the gift shop the document and she will receive [name] free scale.  Pt verbalizes understanding she is to weigh daily and call MD if she has [name] weight gai of 3# or more.   CM called [employer] rep, Tommi Emery to notify of discharge today as pt will receive [facility] services.  No other CM needs were communicated.  Mariane Masters, BSN, [place] [phone].   06/29/14 0930 Henrietta Mayo RN MSN BSN CCM Met with pt and dtr @ bedside, discussed home health RN for congestive failure education/management.  Pt states she is eager to learn everything she can so that she can successfully manage new dx.   Provided choice, pt states she had home health services previously from [place] and wanted their services again.  Referral called to [facility], services will start 24-48 hrs post-d/c.

## 2014-06-29 NOTE — Plan of Care (Signed)
Problem: Food- and Nutrition-Related Knowledge Deficit (NB-1.1) Goal: Nutrition education Formal process to instruct or train a patient/client in a skill or to impart knowledge to help patients/clients voluntarily manage or modify food choices and eating behavior to maintain or improve health. Outcome: Completed/Met Date Met:  06/29/14  RD consulted for nutrition education regarding low sodium diet.  RD provided "Low Sodium Nutrition Therapy" handout from the Academy of [employer]etics. Reviewed patient's dietary recall. Provided examples on ways to decrease sodium intake in diet. Discouraged intake of processed foods and use of salt shaker. Encouraged fresh fruits and vegetables as well as whole grain sources of carbohydrates to maximize fiber intake. Teach back method used.  Expect good compliance.  Body mass index is 39.22 kg/(m^2). Pt meets criteria for Obesity Class II based on current BMI.  Current diet order is Carbohydrate Modified, patient is consuming approximately 100% of meals at this time. Labs and medications reviewed. No further nutrition interventions warranted at this time. RD contact information provided. If additional nutrition issues arise, please re-consult RD.   Arthur Holms, RD, LDN Pager #: [phone] After-Hours Pager #: [phone]

## 2014-06-29 NOTE — CV Procedure (Signed)
       PROCEDURE:  Right and Left heart catheterization with selective coronary angiography, left ventriculogram.  INDICATIONS:  Congestive heart failure  The risks, benefits, and details of the procedure were explained to the patient.  The patient verbalized understanding and wanted to proceed.  Informed written consent was obtained.  PROCEDURE TECHNIQUE:  After Xylocaine anesthesia a 81F slender sheath was placed in the right radial artery with a single anterior needle wall stick.   After Xylocaine anesthesia, a 7 French sheath was placed into the right common femoral vein. A balloontipped Swan-Ganz catheter was advanced to the pulmonary artery. Hemogram pressure assessment was performed. Oxygen saturations were obtained.  IV Heparin was given.  Right coronary angiography was done using a Judkins R4 guide catheter.  Left coronary angiography was done using a Judkins L3.5 guide catheter.  Left ventriculography was done using a pigtail catheter.  A TR band was used for hemostasis.  Manual compression will be used for hemostasis of the right femoral vein.   CONTRAST:  Total of 55 cc.  COMPLICATIONS:  None.    HEMODYNAMICS:  Aortic pressure was 153/84; LV pressure was 160/15; LVEDP 26.  There was no gradient between the left ventricle and aorta.  RA pressure 15/13, mean RA 11 mm Hg; RV 54/11, RVEDP 12 mm Hg; PA 51/30; mean PA 38 mm Hg; PCWP 23/21, mean PCWP 20 mm Hg. PA sat 62%; Ao sat 93%.  CO 6.6 L/min; CI 3.2.  ANGIOGRAPHIC DATA:   The left main coronary artery is widely patent.  The left anterior descending artery is a large vessel which wraps around the apex.  There are two medium sized diagonals that are patent.  The left circumflex artery is a large dominant vessel that is widely patent.  There is a large OM which is widely patent.  The right coronary artery is a patent nondominant vessel.  LEFT VENTRICULOGRAM:  Left ventricular angiogram was done in the 30 RAO projection and revealed  global left ventricular systolic dysfunction with an estimated ejection fraction of 20%.  LVEDP was 26 mmHg. 1+ mitral regurgitation  IMPRESSIONS:  1. No significant CAD. 2. Severe left ventricular systolic function.  LVEDP 26 mmHg.  Ejection fraction 20%.  Moderate pulmonary hypertension.  PA sat 62%; Ao sat 93%.  CO 6.6 L/min; CI 3.2.  RECOMMENDATION:  Nonischemic cardiomyopathy. Continue medical therapy. Pulmonary hypertension is likely secondary to her LV dysfunction.

## 2014-06-29 NOTE — Progress Notes (Signed)
[facility] PROGRESS NOTE  Filed Weights   06/28/14 0946 06/29/14 0555  Weight: 101.9 kg (224 lb 10.4 oz) 97.297 kg (214 lb 8 oz)        Intake/Output Summary (Last 24 hours) at 06/29/14 1135 Last data filed at 06/29/14 0700  Gross per 24 hour  Intake    730 ml  Output   4600 ml  Net  -3870 ml     Assessment/Plan: Acute respiratory failure due to  Acute systolic congestive heart failure - Started on IV diuresis with good UOP. - Echo as below, cardiac cath as below. - monitor electrolytes, replete as needed. - cont ACE-I, bisoprolol and lasix. - cardiac cath as below  Diabetes mellitus - cont lantus Plus SSI.  Essential  Hypertension: - high but improved from previous day. - cont to monitor and titrate antihypertensive as tolerate it.    Hyperlipemia - statins.  Acid reflux - PPI.    Code Status: full Family Communication: none  Disposition Plan: inpatient   Consultants:  Cardiology  Procedures: 1. Cardiac cath: No significant CAD. Severe left ventricular systolic function. LVEDP 26 mmHg. Ejection fraction 20%. Moderate pulmonary hypertension. PA sat 62%; Ao sat 93%. CO 6.6 L/min; CI 3.2. Cardiac cath echo ejection fraction was in the range of 25% to 30%. GLPSS is abnormal at -12%. Severe global hypokinesis. The study is not technically sufficient to allow evaluation of LV diastolic function.  Antibiotics:  None  HPI/Subjective: Nausea vomiting and headache.  Objective: Filed Vitals:   06/29/14 0955 06/29/14 1005 06/29/14 1020 06/29/14 1126  BP: 153/67 144/76 162/71 182/90  Pulse: 96 90 101   Temp:      TempSrc:      Resp: 18 16 16 16   Height:      Weight:      SpO2: 96% 97% 97% 97%     Exam:  General: Alert, awake, oriented x3, in no acute distress.  HEENT: No bruits, no goiter.  Heart: Regular rate and rhythm, without murmurs, rubs, gallops.  Lungs: Good air movement, bilateral air movement.  Abdomen: Soft,  nontender, nondistended, positive bowel sounds.  Neuro: Grossly intact, nonfocal.   Data Reviewed: Basic Metabolic Panel:  Recent Labs Lab 06/28/14 0346 06/29/14 0435  NA 137 141  K 4.7 3.6*  CL 102 100  CO2 22 28  GLUCOSE 310* 174*  BUN 20 22  CREATININE 0.89 1.02  CALCIUM 9.2 9.3   Liver Function Tests:  Recent Labs Lab 06/28/14 0346  AST 16  ALT 9  ALKPHOS 92  BILITOT 0.6  PROT 7.7  ALBUMIN 3.4*   No results for input(s): LIPASE, AMYLASE in the last 168 hours. No results for input(s): AMMONIA in the last 168 hours. CBC:  Recent Labs Lab 06/28/14 0346 06/29/14 0435  WBC 11.9* 12.1*  NEUTROABS 11.0*  --   HGB 10.0* 9.6*  HCT 31.9* 30.1*  MCV 87.2 86.5  PLT 339 320   Cardiac Enzymes:  Recent Labs Lab 06/28/14 0346  TROPONINI <0.30   BNP (last 3 results)  Recent Labs  06/28/14 0346  PROBNP 2230.0*   CBG:  Recent Labs Lab 06/28/14 2101 06/29/14 0555  GLUCAP 311* 159*    No results found for this or any previous visit (from the past 240 hour(s)).   Studies: Dg Chest 2 View  06/28/2014   CLINICAL DATA:  Shortness of breath  EXAM: CHEST  2 VIEW  COMPARISON:  03/02/2014  FINDINGS: There is borderline cardiomegaly. Aortic and  hilar contours are similar to previous. Diffuse interstitial opacity with Kerley lines. Possible trace pleural effusions. No asymmetric opacity. No pneumothorax.  IMPRESSION: Pulmonary interstitial edema. If there are infectious symptoms, note that atypical infection could also give this appearance.   Electronically Signed   By: Tiburcio Pea M.D.   On: 06/28/2014 04:12   Ct Angio Chest Pe W/cm &/or Wo Cm  06/28/2014   CLINICAL DATA:  58 year old female with multiple episodes of shortness of breath over the prior week, worsening while lying flat or while walking. History of knee surgery four and a half weeks ago. No associated chest pain.  EXAM: CT ANGIOGRAPHY CHEST WITH CONTRAST  TECHNIQUE: Multidetector CT imaging of  the chest was performed using the standard protocol during bolus administration of intravenous contrast. Multiplanar CT image reconstructions and MIPs were obtained to evaluate the vascular anatomy.  CONTRAST:  OMNIPAQUE IOHEXOL 350 MG/ML SOLN  COMPARISON:  No priors.  FINDINGS: Mediastinum: No filling defects within the pulmonary arterial tree to suggest underlying pulmonary embolism. Heart size is mildly enlarged with mild left ventricular dilatation. There is no significant pericardial fluid, thickening or pericardial calcification. Multiple borderline enlarged and mildly enlarged mediastinal and bilateral hilar lymph nodes (right greater than left), measuring up to 13 mm in short axis in the low left paratracheal station. Esophagus is unremarkable in appearance. Atherosclerosis of the thoracic aorta and great vessels of the mediastinum. No coronary artery calcifications identified at this time.  Lungs/Pleura: Small bilateral pleural effusions layering dependently are simple in appearance. There is a background of mild diffuse interlobular septal thickening, the appearance of which suggests underlying interstitial pulmonary edema. There is some dependent subsegmental passive atelectasis in the lower lobes of the lungs bilaterally. No acute consolidative airspace disease. No suspicious appearing pulmonary nodules or masses.  Upper Abdomen: Status post cholecystectomy.  Atherosclerosis.  Musculoskeletal: There are no aggressive appearing lytic or blastic lesions noted in the visualized portions of the skeleton.  Review of the MIP images confirms the above findings.  IMPRESSION: 1. No evidence of pulmonary embolism. 2. There is mild cardiomegaly with mild left ventricular dilatation, evidence of mild interstitial pulmonary edema in the lungs, and small bilateral pleural effusions which are simple in appearance layering dependently. Findings are most compatible with congestive heart failure. Clinical  correlation is recommended. 3. Borderline enlarged and mildly enlarged mediastinal and bilateral hilar lymph nodes are nonspecific, but can be commonly seen in the setting of congestive heart failure. 4. Atherosclerosis. 5. Status post cholecystectomy.   Electronically Signed   By: Trudie Reed M.D.   On: 06/28/2014 07:09    Scheduled Meds: . enoxaparin (LOVENOX) injection  40 mg Subcutaneous Q24H  . furosemide  20 mg Intravenous 3 times per day  . hydrALAZINE  25 mg Oral 3 times per day  . insulin aspart  0-15 Units Subcutaneous TID WC  . insulin aspart  0-5 Units Subcutaneous QHS  . insulin glargine  20 Units Subcutaneous QHS  . loratadine  10 mg Oral Daily  . losartan  100 mg Oral q morning - 10a  . mometasone-formoterol  2 puff Inhalation BID  . montelukast  10 mg Oral QHS  . pantoprazole  40 mg Oral Q1200  . rosuvastatin  10 mg Oral QPM  . sodium chloride  3 mL Intravenous Q12H   Continuous Infusions:    Marinda Elk  [facility] Pager [phone] If 8PM-8AM, please contact night-coverage at [email or web address], password [facility] 06/29/2014, 11:35 AM  LOS:  1 day

## 2014-06-29 NOTE — Interval H&P Note (Signed)
Cath Lab Visit (complete for each Cath Lab visit)  Clinical Evaluation Leading to the Procedure:   ACS: Yes.    Non-ACS:    Anginal Classification: CCS IV  Anti-ischemic medical therapy: No Therapy  Non-Invasive Test Results: High-risk stress test findings: cardiac mortality >3%/year  Prior CABG: No previous CABG   TIMI SCORE  Patient Information:  TIMI Score is 0  UA/NSTEMI and low-risk features (e.g., TIMI score  Revascularization of the presumed culprit artery   U (6)  Indication: 9; Score: 6    History and Physical Interval Note:  06/29/2014 7:57 AM  Shelly [name]  has presented today for surgery, with the diagnosis of cp  The various methods of treatment have been discussed with the patient and family. After consideration of risks, benefits and other options for treatment, the patient has consented to  Procedure(s): LEFT AND RIGHT HEART CATHETERIZATION WITH CORONARY ANGIOGRAM (N/A) as a surgical intervention .  The patient's history has been reviewed, patient examined, no change in status, stable for surgery.  I have reviewed the patient's chart and labs.  Questions were answered to the patient's satisfaction.     [name] S.

## 2014-06-29 NOTE — Progress Notes (Signed)
Heart Failure Navigator Consult Note  Presentation: Shelly [name] is a 58 y.o. female with DM on Lantus, HTN, recent right knee replacement for arthritis(11/9), GERD and obesity presenting with dyspnea for 2 wks but worse for past 3 days. She states that when talking at work, she is getting short of breath. It is much worse when she lays flat. No pedal edema. She admits to some chest tightness at rest and has been using her inhalers frequently without relief. She is quite tachycardic. The tightness has resolved since being put on O2 in the ER. She has had some pain below her left rib cage when she tries to take a deep breath and a feeling of fullness in her abdomen. No cough, fever or chills.   Past Medical History  Diagnosis Date  . Diabetes mellitus   . Hypertension   . Hyperlipemia   . Acid reflux   . Anxiety   . Obesity   . Asthma     infrequent problems  . Arthritis   . Rash     sides of neck  . Stress incontinence   . Complication of anesthesia     "I quit breathing during surgery" 2008 - has had surgery since with no problems   . PONV (postoperative nausea and vomiting)   . Pneumonia     June 2015  . Tuberculosis     hx of 1985  . Headache     hx of migraines  . Hepatitis     hx of dx during ruptured appendix hospitalization  . Irritable bowel syndrome   . Endometriosis     hx of     History   Social History  . Marital Status: Legally Separated    Spouse Name: N/A    Number of Children: N/A  . Years of Education: N/A   Social History Main Topics  . Smoking status: Never Smoker   . Smokeless tobacco: Never Used  . Alcohol Use: Yes     Comment: occ  . Drug Use: No  . Sexual Activity: None   Other Topics Concern  . None   Social History Narrative    ECHO:Study Conclusions--06/28/14  - Left ventricle: The cavity size was normal. Wall thickness was normal. Systolic function was severely reduced. The estimated ejection fraction was in the range of  25% to 30%. GLPSS is abnormal at -12%. Severe global hypokinesis. The study is not technically sufficient to allow evaluation of LV diastolic function. - Mitral valve: The posterior leaflet appears tethered. There is moderate to severe mitral regurgitation. - Left atrium: Severely dilated >48 ml/m2. - Right atrium: The atrium was normal in size. - Inferior vena cava: The vessel was normal in size. The respirophasic diameter changes were in the normal range (>= 50%), consistent with normal central venous pressure.  Impressions:  - LVEF 25-30%, severe global hypokinesis, moderate to severe MR, severe LAE.  Transthoracic echocardiography. M-mode, complete 2D, spectral Doppler, and color Doppler. Birthdate: Patient birthdate: Aug 30, 1955. Age: Patient is 58 yr old. Sex: Gender: female. BMI: 41 kg/m^2. Blood pressure:   165/80 Patient status: Inpatient. Study date: Study date: 06/28/2014. Study time: 10:38 AM. Location: Bedside.  Cardiac Catheterization-06/29/14-  HEMODYNAMICS: Aortic pressure was 153/84; LV pressure was 160/15; LVEDP 26. There was no gradient between the left ventricle and aorta. RA pressure 15/13, mean RA 11 mm Hg; RV 54/11, RVEDP 12 mm Hg; PA 51/30; mean PA 38 mm Hg; PCWP 23/21, mean PCWP 20 mm Hg. PA sat 62%;  Ao sat 93%. CO 6.6 L/min; CI 3.2.  ANGIOGRAPHIC DATA: The left main coronary artery is widely patent.  The left anterior descending artery is a large vessel which wraps around the apex. There are two medium sized diagonals that are patent.  The left circumflex artery is a large dominant vessel that is widely patent. There is a large OM which is widely patent.  The right coronary artery is a patent nondominant vessel.  LEFT VENTRICULOGRAM: Left ventricular angiogram was done in the 30 RAO projection and revealed global left ventricular systolic dysfunction with an estimated ejection fraction of 20%. LVEDP was 26 mmHg. 1+  mitral regurgitation  IMPRESSIONS:  1. No significant CAD. 2. Severe left ventricular systolic function. LVEDP 26 mmHg. Ejection fraction 20%. Moderate pulmonary hypertension. PA sat 62%; Ao sat 93%. CO 6.6 L/min; CI 3.2.  RECOMMENDATION: Nonischemic cardiomyopathy. Continue medical therapy. Pulmonary hypertension is likely secondary to her LV dysfunction.  BNP    Component Value Date/Time   PROBNP 2230.0* 06/28/2014 0346    Education Assessment and Provision:  Detailed education and instructions provided on heart failure disease management including the following:  Signs and symptoms of Heart Failure When to call the physician Importance of daily weights Low sodium diet Fluid restriction Medication management Anticipated future follow-up appointments  Patient education given on each of the above topics.  Patient acknowledges understanding and acceptance of all instructions.  I spoke briefly with patient regarding her new HF diagnosis.  She is able to teach back most topics listed above and seems to have a good understanding of HF recommendations.  She has an old scale and plans to get a new one at discharge.  We discussed the importance of daily weights and how that relates to her new diagnosis as well as when to call the physician.  We also discussed a low sodium diet and high sodium foods to avoid-- she will benefit from dietician consult for additional reinforcement regarding her diet and low sodium adherence.  She lost 100 pounds in the past- wants to be healthy and seems motivated.  She did voice concerns over seeing the Cardiologist outpatient as her insurance has an 8 dollar copay for specialty visits.  I told her that some appts would be necessary and she should be able to decrease those as she has meds titrated and seems to be doing well at home.  Education Materials:  "Living Better With Heart Failure" Booklet, Daily Weight Tracker Tool    High Risk Criteria for  Readmission and/or Poor Patient Outcomes:   EF <30%- yes 25-30%  2 or more admissions in 6 months- yes -however new HF diagnosis  Difficult social situation- No  Demonstrates medication noncompliance- No    Barriers of Care:  Knowledge, Compliance   Discharge Planning:   Plans to discharge to home alone.  She will need ongoing education and compliance reinforcement.  She will follow-up with [facility] and already has a PCP.

## 2014-06-29 NOTE — H&P (View-Only) (Signed)
CARDIOLOGY CONSULT NOTE  Patient ID: Shelly [name] MRN: [id number] DOB/AGE: [birth year] 58 y.o.  Admit date: 06/28/2014 Primary Physician Lupita Raider, MD Primary Cardiologist None Chief Complaint  Dyspnea  HPI:  The patient has no past cardiac history.  She has had HTN.  She has cardiovascular risk factors as below.  She has had morbid obesity but over five years she has lost 100 lbs.  She was treated for pneumonia this year at an urgent care.  she had knee surgery in Nov.  For the past several days she thought that she might be getting recurrent pneumonia.  She felt SOB particularly lying flat at night with positive PND.  She was not having cough, fevers, chills, weight gain or edema however.  She had never really had these symptoms before.  She denied chest pain, neck or arm pain.  Chest CT and CSR did demonstrate some pulmonary edema.  ProBNP was 2230.  Troponin x 1 was negative.  Her symptoms were particularly acute last night and so she came to the ED.  She reports that lying flat she hears a crackling.  She otherwise had been feeling OK.  She has been able to walk at work and do her daily activities without excessive SOB.    Past Medical History  Diagnosis Date  . Diabetes mellitus   . Hypertension   . Hyperlipemia   . Acid reflux   . Anxiety   . Obesity   . Asthma     infrequent problems  . Arthritis   . Rash     sides of neck  . Stress incontinence   . Complication of anesthesia     "I quit breathing during surgery" 2008 - has had surgery since with no problems   . PONV (postoperative nausea and vomiting)   . Pneumonia     June 2015  . Tuberculosis     hx of 1985  . Headache     hx of migraines  . Hepatitis     hx of dx during ruptured appendix hospitalization  . Irritable bowel syndrome   . Endometriosis     hx of     Past Surgical History  Procedure Laterality Date  . Cholecystectomy    . Abdominal hysterectomy    . Abdominal adhesion surgery      x 7    . Ruptured appendix surgery    . Colon surgery  2010    colon resection due to adhesions  . Partial knee arthroplasty Left 10/27/2013    Procedure: LEFT KNEE UNICOMPARTMENTAL REPLACEMENT ;  Surgeon: Loanne Drilling, MD;  Location: [facility];  Service: Orthopedics;  Laterality: Left;  . Appendectomy    . Tonsillectomy    . Partial knee arthroplasty Right 05/21/2014    Procedure: MEDIAL UNICOMPARTMENTAL RIGHT KNEE ARTHROPLASTY;  Surgeon: Loanne Drilling, MD;  Location: [facility];  Service: Orthopedics;  Laterality: Right;    Allergies  Allergen Reactions  . Lisinopril Cough  . Penicillins Other (See Comments)    Unknown reaction   . Sulfa Antibiotics Nausea And Vomiting  . Dilaudid [Hydromorphone Hcl] Anxiety and Other (See Comments)    paranoia   Prescriptions prior to admission  Medication Sig Dispense Refill Last Dose  . acetaminophen (TYLENOL) 500 MG tablet Take 1,000 mg by mouth every 6 (six) hours as needed for mild pain or moderate pain.    06/27/2014 at Unknown time  . ADVAIR DISKUS 250-50 MCG/DOSE AEPB Inhale 1 puff into the lungs  2 (two) times daily.   06/27/2014 at Unknown time  . ALPRAZolam (XANAX) 0.5 MG tablet Take 0.5 mg by mouth 3 (three) times daily as needed. Anxiety   06/27/2014 at Unknown time  . cetirizine (ZYRTEC) 10 MG tablet Take 10 mg by mouth daily.   06/27/2014 at Unknown time  . COMBIVENT RESPIMAT 20-100 MCG/ACT AERS respimat Inhale 1 puff into the lungs 3 (three) times daily.   06/27/2014 at Unknown time  . doxycycline (ADOXA) 100 MG tablet Take 1 tablet by mouth 2 (two) times daily. For incision infection   06/27/2014 at Unknown time  . fluticasone (FLONASE) 50 MCG/ACT nasal spray Place 1 spray into both nostrils daily as needed. For allergy nasal congestion   06/27/2014 at Unknown time  . hydrochlorothiazide (HYDRODIURIL) 25 MG tablet Take 1 tablet by mouth daily as needed. For edema   06/27/2014 at Unknown time  . HYDROcodone-acetaminophen (NORCO/VICODIN) 5-325 MG  per tablet Take 1 tablet by mouth every 6 (six) hours as needed for moderate pain.   unknown  . LANTUS SOLOSTAR 100 UNIT/ML Solostar Pen Inject 20 Units into the skin 2 (two) times daily.   06/27/2014 at Unknown time  . losartan (COZAAR) 100 MG tablet Take 100 mg by mouth every morning.   06/27/2014 at Unknown time  . montelukast (SINGULAIR) 10 MG tablet Take 10 mg by mouth at bedtime.   06/27/2014 at Unknown time  . naproxen sodium (ANAPROX) 220 MG tablet Take 220 mg by mouth 2 (two) times daily with a meal.   06/27/2014 at Unknown time  . omeprazole (PRILOSEC OTC) 20 MG tablet Take 20 mg by mouth daily.   06/27/2014 at Unknown time  . rosuvastatin (CRESTOR) 10 MG tablet Take 10 mg by mouth every evening.   06/27/2014 at Unknown time  . albuterol (PROVENTIL HFA;VENTOLIN HFA) 108 (90 BASE) MCG/ACT inhaler Inhale 1 puff into the lungs every 6 (six) hours as needed for wheezing or shortness of breath.   unknown  . methocarbamol (ROBAXIN) 500 MG tablet Take 1 tablet (500 mg total) by mouth every 6 (six) hours as needed for muscle spasms. 80 tablet 0   . oxyCODONE (OXY IR/ROXICODONE) 5 MG immediate release tablet Take 1-2 tablets (5-10 mg total) by mouth every 3 (three) hours as needed for moderate pain, severe pain or breakthrough pain. 80 tablet 0   . rivaroxaban (XARELTO) 10 MG TABS tablet Take 1 tablet (10 mg total) by mouth daily with breakfast. Take daily for nine more days and then discontinue.  Following completion of the Xarelto, start a 325 mg Aspirin daily for three more weeks. 9 tablet 0   . traMADol (ULTRAM) 50 MG tablet Take 1-2 tablets (50-100 mg total) by mouth every 6 (six) hours as needed (mild pain). 60 tablet 1    No family history on file.  History   Social History  . Marital Status: Legally Separated    Spouse Name: N/A    Number of Children: N/A  . Years of Education: N/A   Occupational History  . Not on file.   Social History Main Topics  . Smoking status: Never Smoker     . Smokeless tobacco: Never Used  . Alcohol Use: Yes     Comment: occ  . Drug Use: No  . Sexual Activity: Not on file   Other Topics Concern  . Not on file   Social History Narrative     ROS:    As stated in the HPI and  negative for all other systems.  Physical Exam: Blood pressure 165/80, pulse 107, temperature 97.9 F (36.6 C), temperature source Oral, resp. rate 20, height 5\' 2"  (1.575 m), weight 224 lb 10.4 oz (101.9 kg), SpO2 96 %.  GENERAL:  Well appearing HEENT:  Pupils equal round and reactive, fundi not visualized, oral mucosa unremarkable NECK:  No jugular venous distention, waveform within normal limits, carotid upstroke brisk and symmetric, no bruits, no thyromegaly LYMPHATICS:  No cervical, inguinal adenopathy LUNGS:  Left greater than right basilar crackles. BACK:  No CVA tenderness CHEST:  Unremarkable HEART:  PMI not displaced or sustained,S1 and S2 within normal limits, no S3, no S4, no clicks, no rubs, no murmurs ABD:  Flat, positive bowel sounds normal in frequency in pitch, no bruits, no rebound, no guarding, no midline pulsatile mass, no hepatomegaly, no splenomegaly EXT:  2 plus pulses throughout, no edema, no cyanosis no clubbing SKIN:  No rashes no nodules NEURO:  Cranial nerves II through XII grossly intact, motor grossly intact throughout PSYCH:  Cognitively intact, oriented to person place and time   Labs: Lab Results  Component Value Date   BUN 20 06/28/2014   Lab Results  Component Value Date   CREATININE 0.89 06/28/2014   Lab Results  Component Value Date   NA 137 06/28/2014   K 4.7 06/28/2014   CL 102 06/28/2014   CO2 22 06/28/2014   Lab Results  Component Value Date   TROPONINI <0.30 06/28/2014   Lab Results  Component Value Date   WBC 11.9* 06/28/2014   HGB 10.0* 06/28/2014   HCT 31.9* 06/28/2014   MCV 87.2 06/28/2014   PLT 339 06/28/2014   Lab Results  Component Value Date   ALT 9 06/28/2014   AST 16 06/28/2014    ALKPHOS 92 06/28/2014   BILITOT 0.6 06/28/2014     Radiology:   CXR:   There is borderline cardiomegaly. Aortic and hilar contours are similar to previous. Diffuse interstitial opacity with Kerley lines. Possible trace pleural effusions. No asymmetric opacity. No pneumothorax.  EKG:  Sinus tachycardia, rate 114, axis within NL, QTc prolonged.  Poor anterior R wave progression.  No acute ST T wave changes.  06/28/2014  ASSESSMENT AND PLAN:    ACUTE SYSTOLIC HF:  Moderately severe reduction in EF which is a new finding.  Some MR.   Continue IV diuresis.  I think that we can start a low dose of bisoprolol (try this secondary to asthma).  She will need left heart cath to exclude ischemia.  Right heart to measure pressures.  Final echo results pending.  Continue current dose of ARB.  She is allergic to ACE inhibitor.   HTN:  This is being managed in the context of treating his CHF.   SignedRollene Rotunda 06/28/2014, 10:49 AM

## 2014-06-30 LAB — GLUCOSE, CAPILLARY
GLUCOSE-CAPILLARY: 245 mg/dL — AB (ref 70–99)
GLUCOSE-CAPILLARY: 182 mg/dL — AB (ref 70–99)
Glucose-Capillary: 176 mg/dL — ABNORMAL HIGH (ref 70–99)
Glucose-Capillary: 271 mg/dL — ABNORMAL HIGH (ref 70–99)

## 2014-06-30 MED ORDER — INSULIN GLARGINE 100 UNIT/ML ~~LOC~~ SOLN
10.0000 [IU] | Freq: Once | SUBCUTANEOUS | Status: AC
  Administered 2014-06-30: 10 [IU] via SUBCUTANEOUS
  Filled 2014-06-30: qty 0.1

## 2014-06-30 MED ORDER — SPIRONOLACTONE 25 MG PO TABS
25.0000 mg | ORAL_TABLET | Freq: Every day | ORAL | Status: DC
  Administered 2014-06-30 – 2014-07-01 (×2): 25 mg via ORAL
  Filled 2014-06-30 (×2): qty 1

## 2014-06-30 MED ORDER — INSULIN GLARGINE 100 UNIT/ML ~~LOC~~ SOLN
14.0000 [IU] | Freq: Two times a day (BID) | SUBCUTANEOUS | Status: DC
  Administered 2014-06-30 – 2014-07-01 (×2): 14 [IU] via SUBCUTANEOUS
  Filled 2014-06-30 (×3): qty 0.14

## 2014-06-30 MED ORDER — FUROSEMIDE 40 MG PO TABS
40.0000 mg | ORAL_TABLET | Freq: Two times a day (BID) | ORAL | Status: DC
  Administered 2014-06-30 – 2014-07-01 (×3): 40 mg via ORAL
  Filled 2014-06-30 (×5): qty 1

## 2014-06-30 MED ORDER — POTASSIUM CHLORIDE CRYS ER 20 MEQ PO TBCR
40.0000 meq | EXTENDED_RELEASE_TABLET | Freq: Once | ORAL | Status: AC
  Administered 2014-06-30: 40 meq via ORAL
  Filled 2014-06-30: qty 2

## 2014-06-30 MED ORDER — ACETAMINOPHEN 325 MG PO TABS
325.0000 mg | ORAL_TABLET | Freq: Four times a day (QID) | ORAL | Status: DC | PRN
Start: 2014-06-30 — End: 2014-07-01
  Administered 2014-07-01: 650 mg via ORAL
  Filled 2014-06-30: qty 2

## 2014-06-30 NOTE — Progress Notes (Signed)
CARDIAC REHAB PHASE 1  Education completed re: CHF.  Discussed importance of daily weights, 2 liter fluid restriction, importance of taking all her medications prescribed, when to call the doctor, nutrition tips.  She seems very motivated and I predict she will follow these instructions.  She wants to take a shower before walking and will do so after that.  She has been walking in the room multiple times today.   [id number]  Cindra Eves RN, BSN 06/30/2014 2:58 PM

## 2014-06-30 NOTE — Progress Notes (Signed)
Pt ambulated in hallway 500 ft independently on room air and tolerated activity well. Will continue to monitor

## 2014-06-30 NOTE — Progress Notes (Addendum)
Patient Name: Shelly [name] Date of Encounter: 06/30/2014  Principal Problem:   Acute CHF Active Problems:   Hypertension   Hyperlipemia   Acid reflux   Diabetes mellitus   Asthma   Acute systolic congestive heart failure   SOB (shortness of breath)   Length of Stay: 2  SUBJECTIVE  The patient feels significantly better. Able to lay flat in bed, no SOB, no chest pain.   CURRENT MEDS . bisoprolol  5 mg Oral Daily  . enoxaparin (LOVENOX) injection  40 mg Subcutaneous Q24H  . furosemide  20 mg Intravenous 3 times per day  . insulin aspart  0-15 Units Subcutaneous TID WC  . insulin aspart  0-5 Units Subcutaneous QHS  . insulin glargine  20 Units Subcutaneous QHS  . loratadine  10 mg Oral Daily  . losartan  100 mg Oral q morning - 10a  . mometasone-formoterol  2 puff Inhalation BID  . montelukast  10 mg Oral QHS  . pantoprazole  40 mg Oral Q1200  . rosuvastatin  10 mg Oral QPM  . sodium chloride  3 mL Intravenous Q12H    OBJECTIVE  Filed Vitals:   06/29/14 1342 06/29/14 1419 06/29/14 2121 06/30/14 0529  BP: 124/53  100/57 118/61  Pulse: 100  77 80  Temp: 98.4 F (36.9 C)  98.1 F (36.7 C) 97.8 F (36.6 C)  TempSrc: Oral  Oral Oral  Resp:   18 16  Height:      Weight:    216 lb 6.4 oz (98.158 kg)  SpO2: 86% 92% 95% 98%    Intake/Output Summary (Last 24 hours) at 06/30/14 0915 Last data filed at 06/29/14 2254  Gross per 24 hour  Intake    490 ml  Output    400 ml  Net     90 ml   Filed Weights   06/28/14 0946 06/29/14 0555 06/30/14 0529  Weight: 224 lb 10.4 oz (101.9 kg) 214 lb 8 oz (97.297 kg) 216 lb 6.4 oz (98.158 kg)    PHYSICAL EXAM  General: Pleasant, NAD. Neuro: Alert and oriented X 3. Moves all extremities spontaneously. Psych: Normal affect. HEENT:  Normal  Neck: Supple without bruits or JVD. Lungs:  Resp regular and unlabored, CTA. Heart: RRR no s3, s4, or murmurs. Abdomen: Soft, non-tender, non-distended, BS + x 4.  Extremities: No  clubbing, cyanosis or edema. DP/PT/Radials 2+ and equal bilaterally.  Accessory Clinical Findings  CBC  Recent Labs  06/28/14 0346 06/29/14 0435  WBC 11.9* 12.1*  NEUTROABS 11.0*  --   HGB 10.0* 9.6*  HCT 31.9* 30.1*  MCV 87.2 86.5  PLT 339 320   Basic Metabolic Panel  Recent Labs  06/28/14 0346 06/29/14 0435  NA 137 141  K 4.7 3.6*  CL 102 100  CO2 22 28  GLUCOSE 310* 174*  BUN 20 22  CREATININE 0.89 1.02  CALCIUM 9.2 9.3   Liver Function Tests  Recent Labs  06/28/14 0346  AST 16  ALT 9  ALKPHOS 92  BILITOT 0.6  PROT 7.7  ALBUMIN 3.4*   No results for input(s): LIPASE, AMYLASE in the last 72 hours. Cardiac Enzymes  Recent Labs  06/28/14 0346  TROPONINI <0.30   BNP Invalid input(s): POCBNP D-Dimer  Recent Labs  06/28/14 0346  DDIMER 3.07*   Radiology/Studies  Dg Chest 2 View  06/28/2014   CLINICAL DATA:  Shortness of breath  EXAM: CHEST  2 VIEW  COMPARISON:  03/02/2014  FINDINGS: There is borderline cardiomegaly. Aortic and hilar contours are similar to previous. Diffuse interstitial opacity with Kerley lines. Possible trace pleural effusions. No asymmetric opacity. No pneumothorax.  IMPRESSION: Pulmonary interstitial edema. If there are infectious symptoms, note that atypical infection could also give this appearance.   Electronically Signed   By: Tiburcio PeaJonathan  Watts M.D.   On: 06/28/2014 04:12   Ct Angio Chest Pe W/cm &/or Wo Cm  06/28/2014   CLINICAL DATA:  58 year old female with multiple episodes of shortness of breath over the prior week, worsening while lying flat or while walking. History of knee surgery four and a half weeks ago. No associated chest pain.  EXAM: CT ANGIOGRAPHY CHEST WITH CONTRAST  TECHNIQUE: Multidetector CT imaging of the chest was performed using the standard protocol during bolus administration of intravenous contrast. Multiplanar CT image reconstructions and MIPs were obtained to evaluate the vascular anatomy.  CONTRAST:   100mL OMNIPAQUE IOHEXOL 350 MG/ML SOLN  COMPARISON:  No priors.  FINDINGS: Mediastinum: No filling defects within the pulmonary arterial tree to suggest underlying pulmonary embolism. Heart size is mildly enlarged with mild left ventricular dilatation. There is no significant pericardial fluid, thickening or pericardial calcification. Multiple borderline enlarged and mildly enlarged mediastinal and bilateral hilar lymph nodes (right greater than left), measuring up to 13 mm in short axis in the low left paratracheal station. Esophagus is unremarkable in appearance. Atherosclerosis of the thoracic aorta and great vessels of the mediastinum. No coronary artery calcifications identified at this time.  Lungs/Pleura: Small bilateral pleural effusions layering dependently are simple in appearance. There is a background of mild diffuse interlobular septal thickening, the appearance of which suggests underlying interstitial pulmonary edema. There is some dependent subsegmental passive atelectasis in the lower lobes of the lungs bilaterally. No acute consolidative airspace disease. No suspicious appearing pulmonary nodules or masses.  Upper Abdomen: Status post cholecystectomy.  Atherosclerosis.  Musculoskeletal: There are no aggressive appearing lytic or blastic lesions noted in the visualized portions of the skeleton.  Review of the MIP images confirms the above findings.  IMPRESSION: 1. No evidence of pulmonary embolism. 2. There is mild cardiomegaly with mild left ventricular dilatation, evidence of mild interstitial pulmonary edema in the lungs, and small bilateral pleural effusions which are simple in appearance layering dependently. Findings are most compatible with congestive heart failure. Clinical correlation is recommended. 3. Borderline enlarged and mildly enlarged mediastinal and bilateral hilar lymph nodes are nonspecific, but can be commonly seen in the setting of congestive heart failure. 4. Atherosclerosis.  5. Status post cholecystectomy.   Electronically Signed   By: Trudie Reedaniel  Entrikin M.D.   On: 06/28/2014 07:09    TELE: SR    ASSESSMENT AND PLAN  The patient has no past cardiac history. She has had HTN. She has cardiovascular risk factors as below. She has had morbid obesity but over five years she has lost 100 lbs. She was treated for pneumonia this year at an urgent care. she had knee surgery in Nov. For the past several days she thought that she might be getting recurrent pneumonia. She felt SOB particularly lying flat at night with positive PND. She was not having cough, fevers, chills, weight gain or edema however. She had never really had these symptoms before. She denied chest pain, neck or arm pain. Chest CT and CSR did demonstrate some pulmonary edema. ProBNP was 2230. Troponin x 1 was negative. Her symptoms were particularly acute last night and so she came to the ED. She  reports that lying flat she hears a crackling. She otherwise had been feeling OK. She has been able to walk at work and do her daily activities without excessive SOB.   1. ACUTE SYSTOLIC HF: cath yesterday showed : 1. No significant CAD. Severe left ventricular systolic function. LVEDP 26 mmHg. Ejection fraction 20%. Moderate pulmonary hypertension. PA sat 62%; Ao sat 93%. CO 6.6 L/min; CI 3.2.Moderately severe reduction in EF which is a new finding. Some MR.   Baseline weight 118 lbs, now 116 lbs, we will switch to PO diuretics, if tolerated well, discharge home tomorrow. Started on Bisoprolol, continue losartan. She is allergic to ACE inhibitor.  Start Lasix 40 mg po BID, spironolactone 25 mg po daily. If needed, we will add potassium supplements.  Start physical therapy.   2. HTN: This is being managed in the context of treating his CHF.   Signed, Lars Masson MD, [facility] 06/30/2014

## 2014-06-30 NOTE — Progress Notes (Addendum)
[facility] PROGRESS NOTE  Filed Weights   06/28/14 0946 06/29/14 0555 06/30/14 0529  Weight: 101.9 kg (224 lb 10.4 oz) 97.297 kg (214 lb 8 oz) 98.158 kg (216 lb 6.4 oz)        Intake/Output Summary (Last 24 hours) at 06/30/14 1224 Last data filed at 06/30/14 0935  Gross per 24 hour  Intake    730 ml  Output    400 ml  Net    330 ml   Admission history of present illness/brief narrative: Patient presents with complaints of shortness of breath, PND, echo showing ejection fraction 25-30 percent, underwent cardiac cath on 12/18 showing No significant CAD. Severe left ventricular systolic function. LVEDP 26 mmHg. Ejection fraction 20%. Moderate pulmonary hypertension. PA sat 62%; Ao sat 93%. CO 6.6 L/min; CI 3.2.Moderately severe reduction in EF which is a new finding. Some MR. -Patient was started on IV diuresis which was switched to by mouth diuretics which she tolerated very well, started onBisoprolol,  Losartan (She is allergic to ACE inhibitor ) Lasix 40 mg po BID, spironolactone 25 mg po daily.  Assessment/Plan: Acute respiratory failure due to  Acute systolic congestive heart failure - changeD to by mouth diuresis on 12/19, diuresis per cardiology. - Echo as below, cardiac cath as below. - monitor electrolytes, replete as needed. - Continue bisoprolol, losartan, Lasix. - cardiac cath as below  Diabetes mellitus - Labile, we'll change Lantus to 14 units twice a day. -Continue with insulin sliding scale. -Check hemoglobin A1c  Essential  Hypertension: - Acceptable - cont to monitor and titrate antihypertensive as tolerate it.    Hyperlipemia - statins.  Acid reflux - PPI.    Code Status: full Family Communication: none  Disposition Plan: inpatient   Consultants:  Cardiology  Procedures: 1. Cardiac cath: No significant CAD. Severe left ventricular systolic function. LVEDP 26 mmHg. Ejection fraction 20%. Moderate pulmonary hypertension. PA  sat 62%; Ao sat 93%. CO 6.6 L/min; CI 3.2. Cardiac cath echo ejection fraction was in the range of 25% to 30%. GLPSS is abnormal at -12%. Severe global hypokinesis. The study is not technically sufficient to allow evaluation of LV diastolic function.  Antibiotics:  None  HPI/Subjective: Nausea vomiting and headache.  Objective: Filed Vitals:   06/29/14 1419 06/29/14 2121 06/30/14 0529 06/30/14 1009  BP:  100/57 118/61   Pulse:  77 80   Temp:  98.1 F (36.7 C) 97.8 F (36.6 C)   TempSrc:  Oral Oral   Resp:  18 16   Height:      Weight:   98.158 kg (216 lb 6.4 oz)   SpO2: 92% 95% 98% 94%     Exam:  General: Alert, awake, oriented x3, in no acute distress.  HEENT: No bruits, no goiter.  Heart: Regular rate and rhythm, without murmurs, rubs, gallops.  Lungs: Good air movement, bilateral air movement.  Abdomen: Soft, nontender, nondistended, positive bowel sounds.  Neuro: Grossly intact, nonfocal.   Data Reviewed: Basic Metabolic Panel:  Recent Labs Lab 06/28/14 0346 06/29/14 0435  NA 137 141  K 4.7 3.6*  CL 102 100  CO2 22 28  GLUCOSE 310* 174*  BUN 20 22  CREATININE 0.89 1.02  CALCIUM 9.2 9.3   Liver Function Tests:  Recent Labs Lab 06/28/14 0346  AST 16  ALT 9  ALKPHOS 92  BILITOT 0.6  PROT 7.7  ALBUMIN 3.4*   No results for input(s): LIPASE, AMYLASE in the last 168 hours. No results for  input(s): AMMONIA in the last 168 hours. CBC:  Recent Labs Lab 06/28/14 0346 06/29/14 0435  WBC 11.9* 12.1*  NEUTROABS 11.0*  --   HGB 10.0* 9.6*  HCT 31.9* 30.1*  MCV 87.2 86.5  PLT 339 320   Cardiac Enzymes:  Recent Labs Lab 06/28/14 0346  TROPONINI <0.30   BNP (last 3 results)  Recent Labs  06/28/14 0346  PROBNP 2230.0*   CBG:  Recent Labs Lab 06/29/14 1201 06/29/14 1639 06/29/14 2113 06/30/14 0625 06/30/14 1119  GLUCAP 156* 246* 185* 176* 245*    No results found for this or any previous visit (from the past 240  hour(s)).   Studies: No results found.  Scheduled Meds: . bisoprolol  5 mg Oral Daily  . enoxaparin (LOVENOX) injection  40 mg Subcutaneous Q24H  . furosemide  40 mg Oral BID  . insulin aspart  0-15 Units Subcutaneous TID WC  . insulin aspart  0-5 Units Subcutaneous QHS  . insulin glargine  20 Units Subcutaneous QHS  . loratadine  10 mg Oral Daily  . losartan  100 mg Oral q morning - 10a  . mometasone-formoterol  2 puff Inhalation BID  . montelukast  10 mg Oral QHS  . pantoprazole  40 mg Oral Q1200  . rosuvastatin  10 mg Oral QPM  . sodium chloride  3 mL Intravenous Q12H  . spironolactone  25 mg Oral Daily   Continuous Infusions:    Shelly [name]  [facility] Pager [phone] If 8PM-8AM, please contact night-coverage at [email or web address], password [facility] 06/30/2014, 12:24 PM  LOS: 2 days

## 2014-07-01 DIAGNOSIS — R0600 Dyspnea, unspecified: Secondary | ICD-10-CM

## 2014-07-01 LAB — GLUCOSE, CAPILLARY
GLUCOSE-CAPILLARY: 178 mg/dL — AB (ref 70–99)
Glucose-Capillary: 201 mg/dL — ABNORMAL HIGH (ref 70–99)

## 2014-07-01 LAB — BASIC METABOLIC PANEL
ANION GAP: 15 (ref 5–15)
BUN: 41 mg/dL — ABNORMAL HIGH (ref 6–23)
CHLORIDE: 96 meq/L (ref 96–112)
CO2: 25 mEq/L (ref 19–32)
CREATININE: 1.68 mg/dL — AB (ref 0.50–1.10)
Calcium: 8.6 mg/dL (ref 8.4–10.5)
GFR calc non Af Amer: 33 mL/min — ABNORMAL LOW (ref >90)
GFR, EST AFRICAN AMERICAN: 38 mL/min — AB (ref >90)
Glucose, Bld: 206 mg/dL — ABNORMAL HIGH (ref 70–99)
POTASSIUM: 4.7 meq/L (ref 3.7–5.3)
Sodium: 136 mEq/L — ABNORMAL LOW (ref 137–147)

## 2014-07-01 LAB — CBC
HCT: 30.6 % — ABNORMAL LOW (ref 36.0–46.0)
Hemoglobin: 9.8 g/dL — ABNORMAL LOW (ref 12.0–15.0)
MCH: 28.7 pg (ref 26.0–34.0)
MCHC: 32 g/dL (ref 30.0–36.0)
MCV: 89.5 fL (ref 78.0–100.0)
Platelets: 335 10*3/uL (ref 150–400)
RBC: 3.42 MIL/uL — ABNORMAL LOW (ref 3.87–5.11)
RDW: 15.1 % (ref 11.5–15.5)
WBC: 14.4 10*3/uL — AB (ref 4.0–10.5)

## 2014-07-01 MED ORDER — PROMETHAZINE HCL 12.5 MG PO TABS: 12.5000 mg | ORAL_TABLET | Freq: Four times a day (QID) | ORAL | Status: DC | PRN

## 2014-07-01 MED ORDER — SPIRONOLACTONE 25 MG PO TABS: 25.0000 mg | ORAL_TABLET | Freq: Every day | ORAL | Status: DC

## 2014-07-01 MED ORDER — BISOPROLOL FUMARATE 5 MG PO TABS: 5.0000 mg | ORAL_TABLET | Freq: Every day | ORAL | Status: DC

## 2014-07-01 MED ORDER — FUROSEMIDE 40 MG PO TABS: 40.0000 mg | ORAL_TABLET | Freq: Two times a day (BID) | ORAL | Status: DC

## 2014-07-01 NOTE — Discharge Instructions (Signed)
Follow with Primary MD Shelly Raider, MD in 7 days  Follow with your scheduled cardiology appointment Get CBC, CMP, 2 view Chest X ray checked  by Primary MD next visit.    Activity: As tolerated with Full fall precautions use walker/cane & assistance as needed   Disposition Home    Diet: Heart Healthy with fluid restriction 1500 mL per day , with feeding assistance and aspiration precautions as needed.  For Heart failure patients - Check your Weight same time everyday, if you gain over 2 pounds, or you develop in leg swelling, experience more shortness of breath or chest pain, call your Primary MD immediately. Follow Cardiac Low Salt Diet and 1.5 lit/day fluid restriction.   On your next visit with your primary care physician please Get Medicines reviewed and adjusted.   Please request your Prim.MD to go over all Hospital Tests and Procedure/Radiological results at the follow up, please get all Hospital records sent to your Prim MD by signing hospital release before you go home.   If you experience worsening of your admission symptoms, develop shortness of breath, life threatening emergency, suicidal or homicidal thoughts you must seek medical attention immediately by calling 911 or calling your MD immediately  if symptoms less severe.  You Must read complete instructions/literature along with all the possible adverse reactions/side effects for all the Medicines you take and that have been prescribed to you. Take any new Medicines after you have completely understood and accpet all the possible adverse reactions/side effects.   Do not drive, operating heavy machinery, perform activities at heights, swimming or participation in water activities or provide baby sitting services if your were admitted for syncope or siezures until you have seen by Primary MD or a Neurologist and advised to do so again.  Do not drive when taking Pain medications.    Do not take more than prescribed Pain,  Sleep and Anxiety Medications  Special Instructions: If you have smoked or chewed Tobacco  in the last 2 yrs please stop smoking, stop any regular Alcohol  and or any Recreational drug use.  Wear Seat belts while driving.   Please note  You were cared for by a hospitalist during your hospital stay. If you have any questions about your discharge medications or the care you received while you were in the hospital after you are discharged, you can call the unit and asked to speak with the hospitalist on call if the hospitalist that took care of you is not available. Once you are discharged, your primary care physician will handle any further medical issues. Please note that NO REFILLS for any discharge medications will be authorized once you are discharged, as it is imperative that you return to your primary care physician (or establish a relationship with a primary care physician if you do not have one) for your aftercare needs so that they can reassess your need for medications and monitor your lab values.

## 2014-07-01 NOTE — Discharge Summary (Signed)
Shelly [name], 58 y.o., DOB 27-Mar-1956, MRN [id number]. Admission date: 06/28/2014 Discharge Date 07/01/2014 Primary MD Lupita Raider, MD Admitting Physician Calvert Cantor, MD  Admission Diagnosis  Dyspnea [R06.00] SOB (shortness of breath) [R06.02] Acute systolic congestive heart failure [I50.21]  Discharge Diagnosis   Principal Problem:   Acute CHF Active Problems:   Hypertension   Hyperlipemia   Acid reflux   Diabetes mellitus   Asthma   Acute systolic congestive heart failure   SOB (shortness of breath)   Dyspnea      PCP please follow: Recheck labs including BMP within 7 days, patient started on Lasix 40 mg by mouth twice a day, spironolactone 25 mg oral daily, as well she continues to take losartan 100 mg oral daily, will on potassium level and creatinine.   Past Medical History  Diagnosis Date  . Diabetes mellitus   . Hypertension   . Hyperlipemia   . Acid reflux   . Anxiety   . Obesity   . Asthma     infrequent problems  . Arthritis   . Rash     sides of neck  . Stress incontinence   . Complication of anesthesia     "I quit breathing during surgery" 2008 - has had surgery since with no problems   . PONV (postoperative nausea and vomiting)   . Pneumonia     June 2015  . Tuberculosis     hx of 1985  . Headache     hx of migraines  . Hepatitis     hx of dx during ruptured appendix hospitalization  . Irritable bowel syndrome   . Endometriosis     hx of     Past Surgical History  Procedure Laterality Date  . Cholecystectomy    . Abdominal hysterectomy    . Abdominal adhesion surgery      x 7  . Ruptured appendix surgery    . Colon surgery  2010    colon resection due to adhesions  . Partial knee arthroplasty Left 10/27/2013    Procedure: LEFT KNEE UNICOMPARTMENTAL REPLACEMENT ;  Surgeon: Loanne Drilling, MD;  Location: [facility];  Service: Orthopedics;  Laterality: Left;  . Appendectomy    . Tonsillectomy    . Partial knee arthroplasty Right  05/21/2014    Procedure: MEDIAL UNICOMPARTMENTAL RIGHT KNEE ARTHROPLASTY;  Surgeon: Loanne Drilling, MD;  Location: [facility];  Service: Orthopedics;  Laterality: Right;  . Left and right heart catheterization with coronary angiogram N/A 06/29/2014    Procedure: LEFT AND RIGHT HEART CATHETERIZATION WITH CORONARY ANGIOGRAM;  Surgeon: Corky Crafts, MD;  Location: [facility] CATH LAB;  Service: Cardiovascular;  Laterality: N/A;   Admission history of present illness/brief narrative: Patient presents with complaints of shortness of breath, PND, echo showing ejection fraction 25-30 percent, underwent cardiac cath on 12/18 showing No significant CAD. Severe left ventricular systolic function. LVEDP 26 mmHg. Ejection fraction 20%. Moderate pulmonary hypertension. PA sat 62%; Ao sat 93%. CO 6.6 L/min; CI 3.2.Moderately severe reduction in EF which is a new finding. Some MR. -Patient was started on IV diuresis which was switched to by mouth diuretics which she tolerated very well, started onBisoprolol, Losartan (She is allergic to ACE inhibitor ) Lasix 40 mg po BID, spironolactone 25 mg po daily,   Hospital Course See H&P, Labs, Consult and Test reports for all details in brief, patient was admitted for **  Principal Problem:   Acute CHF Active Problems:   Hypertension   Hyperlipemia  Acid reflux   Diabetes mellitus   Asthma   Acute systolic congestive heart failure   SOB (shortness of breath)   Dyspnea  1. ACUTE SYSTOLIC HF: cath 12/18 showed :  No significant CAD. Severe left ventricular systolic function. LVEDP 26 mmHg. Ejection fraction 20%. Moderate pulmonary hypertension. PA sat 62%; Ao sat 93%. CO 6.6 L/min; CI 3.2.Moderately severe reduction in EF which is a new finding. Some MR.  Patient will be discharged on  Lasix 40 mg PO in the am and in the afternoon, educated about weight monitoring and taking an extra lasix when necessary. Cardiology will arrange for an early follow up  in the clinic.  Started on Bisoprolol, continue losartan. She is allergic to ACE inhibitor.        Start Lasix 40 mg po BID, spironolactone 25 mg po daily.  2. HTN:      Acceptable, being managed in the context of CHF.  3. 1 episode of nsVT - 3 beats - on bisoprolol, cardiology will re-evaluate LVEF in 3 months fr necessity of ICD  4. Diabetes mellitus  will be discharged home on her home regimen of Lantus 20 units subcutaneous twice a day  5. GERD  Continue with PPI on discharge  Consultants:  Cardiology  Procedures: 1. Cardiac cath: No significant CAD. Severe left ventricular systolic function. LVEDP 26 mmHg. Ejection fraction 20%. Moderate pulmonary hypertension. PA sat 62%; Ao sat 93%. CO 6.6 L/min; CI 3.2. Cardiac cath echo ejection fraction was in the range of 25% to 30%. GLPSS is abnormal at -12%. Severe global hypokinesis. The study is not technically sufficient to allow evaluation of LV diastolic function.  Antibiotics:  None  Significant Tests:  See full reports for all details    Dg Chest 2 View  06/28/2014   CLINICAL DATA:  Shortness of breath  EXAM: CHEST  2 VIEW  COMPARISON:  03/02/2014  FINDINGS: There is borderline cardiomegaly. Aortic and hilar contours are similar to previous. Diffuse interstitial opacity with Kerley lines. Possible trace pleural effusions. No asymmetric opacity. No pneumothorax.  IMPRESSION: Pulmonary interstitial edema. If there are infectious symptoms, note that atypical infection could also give this appearance.   Electronically Signed   By: Tiburcio Pea M.D.   On: 06/28/2014 04:12   Ct Angio Chest Pe W/cm &/or Wo Cm  06/28/2014   CLINICAL DATA:  58 year old female with multiple episodes of shortness of breath over the prior week, worsening while lying flat or while walking. History of knee surgery four and a half weeks ago. No associated chest pain.  EXAM: CT ANGIOGRAPHY CHEST WITH CONTRAST  TECHNIQUE: Multidetector CT  imaging of the chest was performed using the standard protocol during bolus administration of intravenous contrast. Multiplanar CT image reconstructions and MIPs were obtained to evaluate the vascular anatomy.  CONTRAST:  OMNIPAQUE IOHEXOL 350 MG/ML SOLN  COMPARISON:  No priors.  FINDINGS: Mediastinum: No filling defects within the pulmonary arterial tree to suggest underlying pulmonary embolism. Heart size is mildly enlarged with mild left ventricular dilatation. There is no significant pericardial fluid, thickening or pericardial calcification. Multiple borderline enlarged and mildly enlarged mediastinal and bilateral hilar lymph nodes (right greater than left), measuring up to 13 mm in short axis in the low left paratracheal station. Esophagus is unremarkable in appearance. Atherosclerosis of the thoracic aorta and great vessels of the mediastinum. No coronary artery calcifications identified at this time.  Lungs/Pleura: Small bilateral pleural effusions layering dependently are simple in appearance. There is  a background of mild diffuse interlobular septal thickening, the appearance of which suggests underlying interstitial pulmonary edema. There is some dependent subsegmental passive atelectasis in the lower lobes of the lungs bilaterally. No acute consolidative airspace disease. No suspicious appearing pulmonary nodules or masses.  Upper Abdomen: Status post cholecystectomy.  Atherosclerosis.  Musculoskeletal: There are no aggressive appearing lytic or blastic lesions noted in the visualized portions of the skeleton.  Review of the MIP images confirms the above findings.  IMPRESSION: 1. No evidence of pulmonary embolism. 2. There is mild cardiomegaly with mild left ventricular dilatation, evidence of mild interstitial pulmonary edema in the lungs, and small bilateral pleural effusions which are simple in appearance layering dependently. Findings are most compatible with congestive heart failure. Clinical  correlation is recommended. 3. Borderline enlarged and mildly enlarged mediastinal and bilateral hilar lymph nodes are nonspecific, but can be commonly seen in the setting of congestive heart failure. 4. Atherosclerosis. 5. Status post cholecystectomy.   Electronically Signed   By: Trudie Reed M.D.   On: 06/28/2014 07:09     Today   Subjective:   Angelys Flum today has no headache,no chest abdominal pain,no new weakness tingling or numbness, feels much better wants to go home today.   Objective:   Blood pressure 121/56, pulse 18, temperature 98.7 F (37.1 C), temperature source Oral, resp. rate 18, height 5\' 2"  (1.575 m), weight 98.068 kg (216 lb 3.2 oz), SpO2 96 %.  Intake/Output Summary (Last 24 hours) at 07/01/14 1144 Last data filed at 07/01/14 0700  Gross per 24 hour  Intake    483 ml  Output   1500 ml  Net  -1017 ml    Exam Awake Alert, Oriented *3, No new F.N deficits, Normal affect [place].AT,PERRAL Supple Neck,No JVD, No cervical lymphadenopathy appriciated.  Symmetrical Chest wall movement, Good air movement bilaterally, CTAB RRR,No Gallops,Rubs or new Murmurs, No Parasternal Heave +ve B.Sounds, Abd Soft, Non tender, No organomegaly appriciated, No rebound -guarding or rigidity. No Cyanosis, Clubbing or edema, No new Rash or bruise  Data Review     CBC w Diff:  Lab Results  Component Value Date   WBC 14.4* 07/01/2014   HGB 9.8* 07/01/2014   HCT 30.6* 07/01/2014   PLT 335 07/01/2014   LYMPHOPCT 6* 06/28/2014   MONOPCT 1* 06/28/2014   EOSPCT 0 06/28/2014   BASOPCT 0 06/28/2014   CMP:  Lab Results  Component Value Date   NA 136* 07/01/2014   K 4.7 07/01/2014   CL 96 07/01/2014   CO2 25 07/01/2014   BUN 41* 07/01/2014   CREATININE 1.68* 07/01/2014   PROT 7.7 06/28/2014   ALBUMIN 3.4* 06/28/2014   BILITOT 0.6 06/28/2014   ALKPHOS 92 06/28/2014   AST 16 06/28/2014   ALT 9 06/28/2014  .  Micro Results No results found for this or any previous  visit (from the past 240 hour(s)).   Discharge Instructions          Follow-up Information    Follow up with [facility].   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 Schedule an appointment as soon as possible for a visit in 2 weeks.   Contact information:   [address] [place] [address] [phone]      Discharge Medications     Medication  List    STOP taking these medications        hydrochlorothiazide 25 MG tablet  Commonly known as:  HYDRODIURIL     HYDROcodone-acetaminophen 5-325 MG per tablet  Commonly known as:  NORCO/VICODIN     naproxen sodium 220 MG tablet  Commonly known as:  ANAPROX     oxyCODONE 5 MG immediate release tablet  Commonly known as:  Oxy IR/ROXICODONE     rivaroxaban 10 MG Tabs tablet  Commonly known as:  XARELTO     traMADol 50 MG tablet  Commonly known as:  ULTRAM      TAKE these medications        acetaminophen 500 MG tablet  Commonly known as:  TYLENOL  Take 1,000 mg by mouth every 6 (six) hours as needed for mild pain or moderate pain.     ADVAIR DISKUS 250-50 MCG/DOSE Aepb  Generic drug:  Fluticasone-Salmeterol  Inhale 1 puff into the lungs 2 (two) times daily.     albuterol 108 (90 BASE) MCG/ACT inhaler  Commonly known as:  PROVENTIL HFA;VENTOLIN HFA  Inhale 1 puff into the lungs every 6 (six) hours as needed for wheezing or shortness of breath.     ALPRAZolam 0.5 MG tablet  Commonly known as:  XANAX  Take 0.5 mg by mouth 3 (three) times daily as needed. Anxiety     bisoprolol 5 MG tablet  Commonly known as:  ZEBETA  Take 1 tablet (5 mg total) by mouth daily.     cetirizine 10 MG tablet  Commonly known as:  ZYRTEC  Take 10 mg by mouth daily.     COMBIVENT RESPIMAT 20-100 MCG/ACT Aers respimat  Generic drug:   Ipratropium-Albuterol  Inhale 1 puff into the lungs 3 (three) times daily.     doxycycline 100 MG tablet  Commonly known as:  ADOXA  Take 1 tablet by mouth 2 (two) times daily. For incision infection     fluticasone 50 MCG/ACT nasal spray  Commonly known as:  FLONASE  Place 1 spray into both nostrils daily as needed. For allergy nasal congestion     furosemide 40 MG tablet  Commonly known as:  LASIX  Take 1 tablet (40 mg total) by mouth 2 (two) times daily.     LANTUS SOLOSTAR 100 UNIT/ML Solostar Pen  Generic drug:  Insulin Glargine  Inject 20 Units into the skin 2 (two) times daily.     losartan 100 MG tablet  Commonly known as:  COZAAR  Take 100 mg by mouth every morning.     methocarbamol 500 MG tablet  Commonly known as:  ROBAXIN  Take 1 tablet (500 mg total) by mouth every 6 (six) hours as needed for muscle spasms.     montelukast 10 MG tablet  Commonly known as:  SINGULAIR  Take 10 mg by mouth at bedtime.     omeprazole 20 MG tablet  Commonly known as:  PRILOSEC OTC  Take 20 mg by mouth daily.     rosuvastatin 10 MG tablet  Commonly known as:  CRESTOR  Take 10 mg by mouth every evening.     spironolactone 25 MG tablet  Commonly known as:  ALDACTONE  Take 1 tablet (25 mg total) by mouth daily.         Total Time in preparing paper work, data evaluation and todays exam - 35 minutes  Briony Parveen M.D on 07/01/2014 at 11:44 AM  [facility] Office  [phone]

## 2014-07-01 NOTE — Evaluation (Signed)
Physical Therapy Evaluation Patient Details Name: Shelly [name] MRN: [id number] DOB: 1956/02/09 Today's Date: 07/01/2014   History of Present Illness  58 y.o. female with DM on Lantus, HTN, recent right knee replacement for arthritis(11/9), GERD and obesity presenting with dyspnea for 2 wks but worse for past 3 days. She states that when talking at work, she is getting short of breath. It is much worse when she lays flat. No pedal edema. She admits to some chest tightness at rest and has been using her inhalers frequently without relief. She is quite tachycardic. The tightness has resolved since being put on O2 in the ER. She has had some pain below her left rib cage when she tries to take a deep breath and a feeling of fullness in her abdomen. No cough, fever or chills.    Clinical Impression  Pt presents with minimal functional limitations related to dizziness/giddiness with positional changes.  Eval reveals high likelihood of peripheral vestibular hypofunction, pt given HEP to initiate rehab and recommend referral to OPPT for vestibular rehab upon d/c to enable return to work and decr risk of inactivity related to symptoms -- MD [place] for [place].  Otherwise, pt is functionally independent and no acute PT needs at this time (d/c pending today?).       Follow Up Recommendations Outpatient PT Vestibular Rehab    Equipment Recommendations  None recommended by PT    Recommendations for Other Services       Precautions / Restrictions Restrictions Weight Bearing Restrictions: No      Mobility  Bed Mobility Overal bed mobility: Modified Independent                Transfers Overall transfer level: Modified independent                  Ambulation/Gait Ambulation/Gait assistance: Modified independent (Device/Increase time) Ambulation Distance (Feet): 250 Feet Assistive device: None Gait Pattern/deviations: Step-through pattern         Stairs            Wheelchair Mobility    Modified Rankin (Stroke Patients Only)       Balance Overall balance assessment: No apparent balance deficits (not formally assessed) (Vestibular function screen, see below)                                           Pertinent Vitals/Pain Pain Assessment: No/denies pain    Home Living Family/patient expects to be discharged to:: Private residence Living Arrangements: Other relatives Available Help at Discharge: Family;Friend(s);Available PRN/intermittently Type of Home: House Home Access: Stairs to enter   [employer] of Steps: 1 Home Layout: One level Home Equipment: Walker - 2 wheels      Prior Function Level of Independence: Independent         Comments: works at [employer]   Dominant Hand: Right    Extremity/Trunk Assessment   Upper Extremity Assessment: Overall WFL for tasks assessed           Lower Extremity Assessment: Overall WFL for tasks assessed         Communication   Communication: No difficulties  Cognition Arousal/Alertness: Awake/alert Behavior During Therapy: WFL for tasks assessed/performed Overall Cognitive Status: Within Functional Limits for tasks assessed  General Comments General comments (skin integrity, edema, etc.): VESTIBULAR ASSESS:  Pt c/o gaze instability, dizzines/nausea with postional changes.  Hx concussion in past, previous ear/sinus infections.  VOR negative, modified dix hallpike positive bilaterally with R>L,  improved following slow Semont maneuver.  Instructed (demo, handout provided) on brandt-daroff exercises and x1 viewing near target in sitting, referral to OPPT for continued vestibular rehab.    Exercises Other Exercises Other Exercises: brandt-daroff x3 each side with min-mod nausea and 'giddiness' and main symptoms Other Exercises: x1 viewing near target in sitting max <1  Hz rotation.      Assessment/Plan    PT Assessment All further PT needs can be met in the next venue of care  PT Diagnosis Difficulty walking   PT Problem List  (Vestibular hypofunciton)  PT Treatment Interventions     PT Goals (Current goals can be found in the Care Plan section) Acute Rehab PT Goals Patient Stated Goal: return to work, be active and 'normal' PT Goal Formulation: All assessment and education complete, DC therapy    Frequency     Barriers to discharge        Co-evaluation               End of Session   Activity Tolerance: Patient tolerated treatment well Patient left: in chair;with call bell/phone within reach           Time: 1042-1111 PT Time Calculation (min) (ACUTE ONLY): 29 min   Charges:   PT Evaluation $Initial PT Evaluation Tier I: 1 Procedure PT Treatments $Neuromuscular Re-education: 8-22 mins $Canalith Rep Proc: 8-22 mins   PT G Codes:          Herbie Drape 07/01/2014, 11:16 AM

## 2014-07-01 NOTE — Progress Notes (Signed)
Patient Name: Shelly [name] Date of Encounter: 07/01/2014  Principal Problem:   Acute CHF Active Problems:   Hypertension   Hyperlipemia   Acid reflux   Diabetes mellitus   Asthma   Acute systolic congestive heart failure   SOB (shortness of breath)   Length of Stay: 3  SUBJECTIVE  The patient feels significantly better. Able to lay flat in bed, no SOB, no chest pain. Sleeping comfortably.   CURRENT MEDS . bisoprolol  5 mg Oral Daily  . enoxaparin (LOVENOX) injection  40 mg Subcutaneous Q24H  . furosemide  40 mg Oral BID  . insulin aspart  0-15 Units Subcutaneous TID WC  . insulin aspart  0-5 Units Subcutaneous QHS  . insulin glargine  14 Units Subcutaneous BID  . loratadine  10 mg Oral Daily  . losartan  100 mg Oral q morning - 10a  . mometasone-formoterol  2 puff Inhalation BID  . montelukast  10 mg Oral QHS  . pantoprazole  40 mg Oral Q1200  . rosuvastatin  10 mg Oral QPM  . sodium chloride  3 mL Intravenous Q12H  . spironolactone  25 mg Oral Daily    OBJECTIVE  Filed Vitals:   06/30/14 1540 06/30/14 1722 06/30/14 2008 07/01/14 0530  BP: 122/59 116/60 108/56 121/56  Pulse: 71 62 68 18  Temp: 97.9 F (36.6 C)  97.6 F (36.4 C) 98.7 F (37.1 C)  TempSrc: Oral  Oral Oral  Resp: 17  18   Height:      Weight:    216 lb 3.2 oz (98.068 kg)  SpO2: 97% 99% 98% 93%    Intake/Output Summary (Last 24 hours) at 07/01/14 0742 Last data filed at 07/01/14 0534  Gross per 24 hour  Intake    483 ml  Output   1500 ml  Net  -1017 ml   Filed Weights   06/29/14 0555 06/30/14 0529 07/01/14 0530  Weight: 214 lb 8 oz (97.297 kg) 216 lb 6.4 oz (98.158 kg) 216 lb 3.2 oz (98.068 kg)    PHYSICAL EXAM  General: Pleasant, NAD. Neuro: Alert and oriented X 3. Moves all extremities spontaneously. Psych: Normal affect. HEENT:  Normal  Neck: Supple without bruits or JVD. Lungs:  Resp regular and unlabored, CTA. Heart: RRR no s3, s4, or murmurs. Abdomen: Soft,  non-tender, non-distended, BS + x 4.  Extremities: No clubbing, cyanosis or edema. DP/PT/Radials 2+ and equal bilaterally.  Accessory Clinical Findings  CBC  Recent Labs  06/29/14 0435 07/01/14 0308  WBC 12.1* 14.4*  HGB 9.6* 9.8*  HCT 30.1* 30.6*  MCV 86.5 89.5  PLT 320 335   Basic Metabolic Panel  Recent Labs  06/29/14 0435 07/01/14 0308  NA 141 136*  K 3.6* 4.7  CL 100 96  CO2 28 25  GLUCOSE 174* 206*  BUN 22 41*  CREATININE 1.02 1.68*  CALCIUM 9.3 8.6   Dg Chest 2 View  06/28/2014   CLINICAL DATA:  Shortness of breath  EXAM: CHEST  2 VIEW  COMPARISON:  03/02/2014  FINDINGS: There is borderline cardiomegaly. Aortic and hilar contours are similar to previous. Diffuse interstitial opacity with Kerley lines. Possible trace pleural effusions. No asymmetric opacity. No pneumothorax.  IMPRESSION: Pulmonary interstitial edema. If there are infectious symptoms, note that atypical infection could also give this appearance.   Electronically Signed   By: Tiburcio Pea   TELE: SR, 3 beats of nsVT    ASSESSMENT AND PLAN  The patient  has no past cardiac history. She has had HTN. She has cardiovascular risk factors as below. She has had morbid obesity but over five years she has lost 100 lbs. She was treated for pneumonia this year at an urgent care. she had knee surgery in Nov. For the past several days she thought that she might be getting recurrent pneumonia. She felt SOB particularly lying flat at night with positive PND. She was not having cough, fevers, chills, weight gain or edema however. She had never really had these symptoms before. She denied chest pain, neck or arm pain. Chest CT and CSR did demonstrate some pulmonary edema. ProBNP was 2230. Troponin x 1 was negative. Her symptoms were particularly acute last night and so she came to the ED. She reports that lying flat she hears a crackling. She otherwise had been feeling OK. She has been able to walk at  work and do her daily activities without excessive SOB.   1. ACUTE SYSTOLIC HF: cath yesterday showed : 1. No significant CAD. Severe left ventricular systolic function. LVEDP 26 mmHg. Ejection fraction 20%. Moderate pulmonary hypertension. PA sat 62%; Ao sat 93%. CO 6.6 L/min; CI 3.2.Moderately severe reduction in EF which is a new finding. Some MR.   Baseline weight 118 lbs, now 116 lbs, negative -1 Liter on oral diuretics, I would discharge her on Lasix 40 mg PO in the am and in the afternoon, educated about weight monitoring and taking an extra lasix when necessary. We will arrange for an early follow up in the clinic.  Started on Bisoprolol, continue losartan. She is allergic to ACE inhibitor.  Start Lasix 40 mg po BID, spironolactone 25 mg po daily. If needed, we will add potassium supplements.  Start physical therapy.   2. HTN: This is being managed in the context of treating his CHF.  3. 1 episode of nsVT - 3 beats - on bisoprolol, we will re-evaluate LVEF in 3 months fr necessity of ICD.   She can be discharged today.   Signed, Lars MassonNELSON, Ellington Greenslade H MD, [facility] 07/01/2014

## 2014-07-02 LAB — GLUCOSE, CAPILLARY: GLUCOSE-CAPILLARY: 173 mg/dL — AB (ref 70–99)

## 2014-07-04 ENCOUNTER — Telehealth: Payer: Self-pay | Admitting: Physician Assistant

## 2014-07-04 NOTE — Telephone Encounter (Signed)
Shanda Bumps is calling to get some home health orders for the pt. Please call  Thanks

## 2014-07-05 LAB — GLYCOHEMOGLOBIN, TOTAL: Hemoglobin-A1c: 11.2 % — ABNORMAL HIGH (ref 5.4–7.4)

## 2014-07-05 NOTE — Telephone Encounter (Signed)
Pt recently dx with CHF and seen in Hospital by our services. Has a f/u with Bryan on 1/5. Princella Pellegrini, [facility] RN that pt needs BMET drawn in 7 days per d/c summary from hospital. Told her to have sent to [facility] since he is f/u with pt soon. Will route this to Judie Grieve so he is aware. Also gave verbal order to continue [facility] services.

## 2014-07-05 NOTE — Telephone Encounter (Signed)
Attempted to call back [place] nurse, voice mailbox was full.

## 2014-07-10 ENCOUNTER — Telehealth: Payer: Self-pay | Admitting: Physician Assistant

## 2014-07-10 NOTE — Telephone Encounter (Signed)
Please have her go to a lab and have it drawn.  Judie Grieve

## 2014-07-10 NOTE — Telephone Encounter (Signed)
Shelly [name] is wanting to speak with a nurse about discharging the pt since she has returned to work yesterday. Please call

## 2014-07-10 NOTE — Telephone Encounter (Signed)
Spoke with Shanda Bumps with [facility]. She was assigned to see patient and received HH orders. Patient was supposed to have lab work on Thursday 12/31 ordered by B. Hager, PA but patient went back to work yesterday, thus [facility] has to discharge her from their care - meaning they won't be able to do lab work.   Will route to B. Leron Croak, PA for advise. Patient is scheduled to see him for follow up 07/17/14

## 2014-07-11 NOTE — Telephone Encounter (Signed)
LMTCB

## 2014-07-11 NOTE — Telephone Encounter (Signed)
Patient had labs at PCP today - including metabolic panel. She will bring the results with her to the OV on 07/17/14. She also inquired about seeing the providers at the [facility]. Instructed patient to talk with Judie Grieve about it on Tuesday.

## 2014-07-16 ENCOUNTER — Encounter: Payer: Self-pay | Admitting: Cardiology

## 2014-07-17 ENCOUNTER — Encounter: Payer: Self-pay | Admitting: Physician Assistant

## 2014-07-17 ENCOUNTER — Other Ambulatory Visit: Payer: Self-pay | Admitting: *Deleted

## 2014-07-17 ENCOUNTER — Ambulatory Visit (INDEPENDENT_AMBULATORY_CARE_PROVIDER_SITE_OTHER): Payer: 59 | Admitting: Physician Assistant

## 2014-07-17 VITALS — BP 130/80 | HR 67 | Ht 62.0 in | Wt 218.9 lb

## 2014-07-17 DIAGNOSIS — I509 Heart failure, unspecified: Secondary | ICD-10-CM

## 2014-07-17 DIAGNOSIS — N179 Acute kidney failure, unspecified: Secondary | ICD-10-CM

## 2014-07-17 DIAGNOSIS — E785 Hyperlipidemia, unspecified: Secondary | ICD-10-CM

## 2014-07-17 DIAGNOSIS — E669 Obesity, unspecified: Secondary | ICD-10-CM

## 2014-07-17 DIAGNOSIS — I5021 Acute systolic (congestive) heart failure: Secondary | ICD-10-CM

## 2014-07-17 DIAGNOSIS — I10 Essential (primary) hypertension: Secondary | ICD-10-CM

## 2014-07-17 MED ORDER — FUROSEMIDE 40 MG PO TABS: 40.0000 mg | ORAL_TABLET | Freq: Two times a day (BID) | ORAL | Status: DC

## 2014-07-17 NOTE — Assessment & Plan Note (Signed)
Mild improvement in creatinine on the 31st. Be met and is being checked today Dr. Raul Del office

## 2014-07-17 NOTE — Assessment & Plan Note (Signed)
Currently euvolemic. She is no lower extremity edema and no complaints of orthopnea or PND. She is monitoring her weight daily. She had a basic metabolic panel on the 31st which showed mild improvement in her creatinine. Dr. Clelia Croft was checking another one today. She will be started on ARB.  We'll recheck 2-D echo in 3 months to see if there's been improvement. May need to consider an ICD at that time has not been improvement. Continue Lasix and spironolactone.

## 2014-07-17 NOTE — Patient Instructions (Signed)
Your physician recommends that you schedule a follow-up appointment in: 3 months with Dr.Hochrein  Your physician has requested that you have an echocardiogram. Echocardiography is a painless test that uses sound waves to create images of your heart. It provides your doctor with information about the size and shape of your heart and how well your heart's chambers and valves are working. This procedure takes approximately one hour. There are no restrictions for this procedure. This will be scheduled in THREE MONTHS before you see Dr.Hochrein.   Weigh yourself daily. If you notice an increase of more than 3 pounds in 24 hours or 5 pounds in a week, please take an additional dose of your Lasix to help pull the extra fluid off.

## 2014-07-17 NOTE — Assessment & Plan Note (Signed)
?   Statin.

## 2014-07-17 NOTE — Telephone Encounter (Signed)
Rx refill sent to patient pharmacy   

## 2014-07-17 NOTE — Progress Notes (Signed)
Patient ID: Shelly [name], female   DOB: 1956-06-02, 59 y.o.   MRN: [id number]    Date:  07/17/2014   ID:  Shelly [name], DOB 12-03-1955, MRN [id number]  PCP:  Shelly Neer, MD  Primary Cardiologist:  Shelly [name]  Chief Complaint  Patient presents with  . Follow-up    Cath 06/29/14 no chest pain, dyspnea or swelling.      History of Present Illness: Shelly [name] is a 59 y.o. female with history of hypertension, hyperlipidemia, diabetes mellitus, acid reflux, obesity, proptosis, hepatitis.  Patient was just discharged from the hospital on December 20 having acute heart failure exacerbation.  She underwent cardiac catheterization which showed no significant CAD. Ejection fraction was 20% with moderate pulmonary hypertension.  She had one episode of NSVT of 3 beats. She was put on by sotalol. She was discharged on Lasix and spironolactone.  2-D echocardiogram revealed an ejection fraction 25-30%. Severe global hypokinesis. The posterior leaflet of the mitral valve appeared tethered. There was moderate to severe mitral regurgitation.  She presents today for posthospital evaluation.  Reports feeling better. Her weight is stable. She denies orthopnea or shortness of breath PND.  She does report having a headache which reminds her of f when she was dehydrated.  The patient currently denies nausea, vomiting, fever, chest pain, dizziness, cough, congestion, abdominal pain, hematochezia, melena, lower extremity edema, claudication.  Wt Readings from Last 3 Encounters:  07/17/14 218 lb 14.4 oz (99.292 kg)  07/01/14 216 lb 3.2 oz (98.068 kg)  05/21/14 226 lb (102.513 kg)     Past Medical History  Diagnosis Date  . Diabetes mellitus   . Hypertension   . Hyperlipemia   . Acid reflux   . Anxiety   . Obesity   . Asthma     infrequent problems  . Arthritis   . Rash     sides of neck  . Stress incontinence   . Complication of anesthesia     "I quit breathing during surgery" 2008 - has had  surgery since with no problems   . PONV (postoperative nausea and vomiting)   . Pneumonia     June 2015  . Tuberculosis     hx of 1985  . Headache     hx of migraines  . Hepatitis     hx of dx during ruptured appendix hospitalization  . Irritable bowel syndrome   . Endometriosis     hx of     Current Outpatient Prescriptions  Medication Sig Dispense Refill  . ADVAIR DISKUS 250-50 MCG/DOSE AEPB Inhale 1 puff into the lungs 2 (two) times daily.    Marland Kitchen albuterol (PROVENTIL HFA;VENTOLIN HFA) 108 (90 BASE) MCG/ACT inhaler Inhale 1 puff into the lungs every 6 (six) hours as needed for wheezing or shortness of breath.    . ALPRAZolam (XANAX) 0.5 MG tablet Take 0.5 mg by mouth 3 (three) times daily as needed. Anxiety    . BENICAR 40 MG tablet Take 40 mg by mouth daily.    . bisoprolol (ZEBETA) 5 MG tablet Take 1 tablet (5 mg total) by mouth daily. 30 tablet 0  . cetirizine (ZYRTEC) 10 MG tablet Take 10 mg by mouth daily.    . fluticasone (FLONASE) 50 MCG/ACT nasal spray Place 1 spray into both nostrils daily as needed. For allergy nasal congestion    . furosemide (LASIX) 40 MG tablet Take 1 tablet (40 mg total) by mouth 2 (two) times daily. 60 tablet 0  . LANTUS  SOLOSTAR 100 UNIT/ML Solostar Pen Inject 20 Units into the skin 2 (two) times daily.    Marland Kitchen losartan (COZAAR) 100 MG tablet Take 100 mg by mouth every morning.    . montelukast (SINGULAIR) 10 MG tablet Take 10 mg by mouth at bedtime.    Marland Kitchen omeprazole (PRILOSEC OTC) 20 MG tablet Take 20 mg by mouth daily.    . rosuvastatin (CRESTOR) 10 MG tablet Take 10 mg by mouth every evening.    Marland Kitchen spironolactone (ALDACTONE) 25 MG tablet Take 1 tablet (25 mg total) by mouth daily. 30 tablet 0   No current facility-administered medications for this visit.    Allergies:    Allergies  Allergen Reactions  . Lisinopril Cough  . Penicillins Other (See Comments)    Unknown reaction   . Sulfa Antibiotics Nausea And Vomiting  . Dilaudid [Hydromorphone  Hcl] Anxiety and Other (See Comments)    paranoia    Social History:  The patient  reports that she has never smoked. She has never used smokeless tobacco. She reports that she drinks alcohol. She reports that she does not use illicit drugs.   Family history:  No family history on file.  ROS:  Please see the history of present illness.  All other systems reviewed and negative.   PHYSICAL EXAM: VS:  BP 130/80 mmHg  [name] 67  Ht 5' 2"  (1.575 m)  Wt 218 lb 14.4 oz (99.292 kg)  BMI 40.03 kg/m2 Obese, well developed, in no acute distress HEENT: Pupils are equal round react to light accommodation extraocular movements are intact.  Neck: no JVDNo cervical lymphadenopathy. Cardiac: Regular rate and rhythm without murmurs rubs or gallops. Lungs:  clear to auscultation bilaterally, no wheezing, rhonchi or rales Abd: soft, nontender, positive bowel sounds all quadrants,  Ext: no lower extremity edema.  2+ radial and dorsalis pedis pulses. Skin: warm and dry Neuro:  Grossly normal  EKG:  Normal sinus rhythm atrial enlargement rate 67 bpm.  ASSESSMENT AND PLAN:  Problem List Items Addressed This Visit    Obesity    Encouraged continued weight loss    Hypertension    Blood pressure is just mild right at target. She is to start an ARB which would hopefully improve this little more.    Relevant Medications      BENICAR 40 MG tablet   Other Relevant Orders      EKG 12-Lead   Hyperlipemia    Statin    Relevant Medications      BENICAR 40 MG tablet   ARF (acute renal failure)    Mild improvement in creatinine on the 31st. Be met and is being checked today Dr. Raul [name] office    Acute systolic congestive heart failure    Currently euvolemic. She is no lower extremity edema and no complaints of orthopnea or PND. She is monitoring her weight daily. She had a basic metabolic panel on the 35TD which showed mild improvement in her creatinine. Dr. Brigitte [name] was checking another one today. She will  be started on ARB.  We'll recheck 2-D echo in 3 months to see if there's been improvement. May need to consider an ICD at that time has not been improvement. Continue Lasix and spironolactone.    Relevant Medications      BENICAR 40 MG tablet   Acute CHF - Primary   Relevant Medications      BENICAR 40 MG tablet   Other Relevant Orders      2D Echocardiogram without  contrast

## 2014-07-17 NOTE — Assessment & Plan Note (Signed)
Encouraged continued weight loss.

## 2014-07-17 NOTE — Assessment & Plan Note (Signed)
Blood pressure is just mild right at target. She is to start an ARB which would hopefully improve this little more.

## 2014-07-25 ENCOUNTER — Telehealth: Payer: Self-pay | Admitting: Physician Assistant

## 2014-07-25 MED ORDER — BISOPROLOL FUMARATE 5 MG PO TABS: 5.0000 mg | ORAL_TABLET | Freq: Every day | ORAL | Status: DC

## 2014-07-25 MED ORDER — FUROSEMIDE 40 MG PO TABS: 40.0000 mg | ORAL_TABLET | Freq: Two times a day (BID) | ORAL | Status: DC

## 2014-07-25 MED ORDER — SPIRONOLACTONE 25 MG PO TABS: 25.0000 mg | ORAL_TABLET | Freq: Every day | ORAL | Status: DC

## 2014-07-25 NOTE — Telephone Encounter (Signed)
Refill submitted to patient's preferred pharmacy. Informed patient. Pt voiced understanding, no other stated concerns at this time.  

## 2014-07-25 NOTE — Telephone Encounter (Signed)
Pt was here on 1-5 and her 3 prescriptions were supposed to be called in. She have not received them. Please call in her Furosemide,Spironolactone and Bisoprolol Fumarate. Please call them today to [phone].

## 2014-10-05 ENCOUNTER — Ambulatory Visit (HOSPITAL_COMMUNITY)
Admission: RE | Admit: 2014-10-05 | Discharge: 2014-10-05 | Disposition: A | Discharge status: Home / Self Care | Payer: 59 | Source: Ambulatory Visit | Attending: Physician Assistant | Admitting: Physician Assistant

## 2014-10-05 DIAGNOSIS — I509 Heart failure, unspecified: Secondary | ICD-10-CM | POA: Diagnosis present

## 2014-10-05 NOTE — Progress Notes (Signed)
2D Echocardiogram Complete.  10/05/2014   Kincade Granberg, RDCS  

## 2015-02-21 ENCOUNTER — Telehealth: Payer: Self-pay | Admitting: Cardiology

## 2015-02-21 NOTE — Telephone Encounter (Signed)
No

## 2015-02-21 NOTE — Telephone Encounter (Signed)
Last echo was 10-05-14 with improvement of EF% from 25-30% to 50-55%. Will forward for dr hochrein's review to advise

## 2015-02-21 NOTE — Telephone Encounter (Signed)
Received records from [facility] for appointment on 02/22/15 with Dr Antoine Poche.  Records given to [facility] (medical records) for Dr Hochrein's schedule on 02/22/15. lp

## 2015-02-21 NOTE — Telephone Encounter (Signed)
Pt called in wanting to know if Dr. Antoine Poche is going to want her to have another Echo prior to coming in to her 3 month f/u appt . Please call and advise  Thanks

## 2015-02-22 ENCOUNTER — Ambulatory Visit: Payer: 59 | Admitting: Cardiology

## 2015-02-22 NOTE — Telephone Encounter (Signed)
LVM on identified line that Dr. Antoine Poche said she does not need an echo prior to her next OV

## 2015-04-17 ENCOUNTER — Encounter: Payer: Self-pay | Admitting: Cardiology

## 2015-04-18 ENCOUNTER — Encounter: Payer: Self-pay | Admitting: *Deleted

## 2015-04-18 ENCOUNTER — Ambulatory Visit (INDEPENDENT_AMBULATORY_CARE_PROVIDER_SITE_OTHER): Payer: 59 | Admitting: Cardiology

## 2015-04-18 VITALS — BP 130/80 | HR 79 | Ht 62.0 in | Wt 225.0 lb

## 2015-04-18 DIAGNOSIS — E1121 Type 2 diabetes mellitus with diabetic nephropathy: Secondary | ICD-10-CM | POA: Insufficient documentation

## 2015-04-18 DIAGNOSIS — F39 Unspecified mood [affective] disorder: Secondary | ICD-10-CM | POA: Insufficient documentation

## 2015-04-18 DIAGNOSIS — K219 Gastro-esophageal reflux disease without esophagitis: Secondary | ICD-10-CM | POA: Insufficient documentation

## 2015-04-18 DIAGNOSIS — IMO0002 Reserved for concepts with insufficient information to code with codable children: Secondary | ICD-10-CM | POA: Insufficient documentation

## 2015-04-18 DIAGNOSIS — I10 Essential (primary) hypertension: Secondary | ICD-10-CM

## 2015-04-18 DIAGNOSIS — N184 Chronic kidney disease, stage 4 (severe): Secondary | ICD-10-CM | POA: Insufficient documentation

## 2015-04-18 DIAGNOSIS — E782 Mixed hyperlipidemia: Secondary | ICD-10-CM | POA: Insufficient documentation

## 2015-04-18 DIAGNOSIS — N183 Chronic kidney disease, stage 3 unspecified: Secondary | ICD-10-CM | POA: Insufficient documentation

## 2015-04-18 DIAGNOSIS — I509 Heart failure, unspecified: Secondary | ICD-10-CM | POA: Insufficient documentation

## 2015-04-18 DIAGNOSIS — D649 Anemia, unspecified: Secondary | ICD-10-CM | POA: Insufficient documentation

## 2015-04-18 DIAGNOSIS — I130 Hypertensive heart and chronic kidney disease with heart failure and stage 1 through stage 4 chronic kidney disease, or unspecified chronic kidney disease: Secondary | ICD-10-CM | POA: Insufficient documentation

## 2015-04-18 DIAGNOSIS — I700 Atherosclerosis of aorta: Secondary | ICD-10-CM | POA: Insufficient documentation

## 2015-04-18 DIAGNOSIS — Z86010 Personal history of colonic polyps: Secondary | ICD-10-CM | POA: Insufficient documentation

## 2015-04-18 DIAGNOSIS — M159 Polyosteoarthritis, unspecified: Secondary | ICD-10-CM | POA: Insufficient documentation

## 2015-04-18 MED ORDER — LOSARTAN POTASSIUM 100 MG PO TABS: 150.0000 mg | ORAL_TABLET | Freq: Every morning | ORAL | Status: DC

## 2015-04-18 NOTE — Patient Instructions (Signed)
Your physician recommends that you schedule a follow-up appointment in: March 2017  Your physician has recommended you make the following change in your medication: STOP Benicar and INCREASE Losartan 150 mg(1 1/2) tablets daily.

## 2015-04-18 NOTE — Progress Notes (Signed)
Cardiology Office Note   Date:  04/19/2015   ID:  Shelly [name], DOB [birth year], MRN [id number]  PCP:  Lupita Raider, MD  Cardiologist:   Rollene Rotunda, MD   Chief Complaint  Patient presents with  . Cardiomyopathy      History of Present Illness: Shelly [name] is a 59 y.o. female who presents for follow-up of dilated cardiomyopathy. She was hospitalized in late December of last year for this. This was found to be nonischemic with no obstructive coronary disease. She was managed medically. Follow-up echocardiography earlier this year demonstrated her ejection fraction to be back 55-60% and previous mitral regurgitation to no longer be noted.  She returns for follow-up. She's feeling well although she's been on her feet a lot has some lower extremity swelling. She's not having any shortness of breath that she had previously.  She denies any PND or orthopnea. She's got little weight. She's not having any palpitations, presyncope or syncope. She's had no chest pressure, neck or arm discomfort.  Past Medical History  Diagnosis Date  . Diabetes mellitus   . Hypertension   . Hyperlipemia   . Acid reflux   . Anxiety   . Obesity   . Asthma     infrequent problems  . Arthritis   . Rash     sides of neck  . Stress incontinence   . Complication of anesthesia     "I quit breathing during surgery" 2008 - has had surgery since with no problems   . PONV (postoperative nausea and vomiting)   . Pneumonia 12/2013  . Tuberculosis 1985  . Headache     hx of migraines  . Hepatitis     hx of dx during ruptured appendix hospitalization  . Irritable bowel syndrome   . Endometriosis     hx of   . Cardiomyopathy [facility])     Past Surgical History  Procedure Laterality Date  . Cholecystectomy    . Abdominal hysterectomy    . Abdominal adhesion surgery      x 7  . Ruptured appendix surgery    . Colon surgery  2010    colon resection due to adhesions  . Partial knee arthroplasty Left  10/27/2013    Procedure: LEFT KNEE UNICOMPARTMENTAL REPLACEMENT ;  Surgeon: Loanne Drilling, MD;  Location: [facility] ORS;  Service: Orthopedics;  Laterality: Left;  . Appendectomy    . Tonsillectomy    . Partial knee arthroplasty Right 05/21/2014    Procedure: MEDIAL UNICOMPARTMENTAL RIGHT KNEE ARTHROPLASTY;  Surgeon: Loanne Drilling, MD;  Location: [facility] ORS;  Service: Orthopedics;  Laterality: Right;  . Left and right heart catheterization with coronary angiogram N/A 06/29/2014    Procedure: LEFT AND RIGHT HEART CATHETERIZATION WITH CORONARY ANGIOGRAM;  Surgeon: Corky Crafts, MD;  Location: [facility] CATH LAB;  Service: Cardiovascular;  Laterality: N/A;     Current Outpatient Prescriptions  Medication Sig Dispense Refill  . ADVAIR DISKUS 250-50 MCG/DOSE AEPB Inhale 1 puff into the lungs 2 (two) times daily.    Marland Kitchen albuterol (PROVENTIL HFA;VENTOLIN HFA) 108 (90 BASE) MCG/ACT inhaler Inhale 1 puff into the lungs every 6 (six) hours as needed for wheezing or shortness of breath.    . ALPRAZolam (XANAX) 0.5 MG tablet Take 0.5 mg by mouth 3 (three) times daily as needed. Anxiety    . carvedilol (COREG) 6.25 MG tablet Take 1 tablet by mouth 2 (two) times daily.    . cetirizine (ZYRTEC) 10 MG tablet  Take 10 mg by mouth daily.    . fluticasone (FLONASE) 50 MCG/ACT nasal spray Place 1 spray into both nostrils daily as needed. For allergy nasal congestion    . furosemide (LASIX) 40 MG tablet Take 1 tablet (40 mg total) by mouth 2 (two) times daily. 60 tablet 11  . Insulin Glargine (TOUJEO SOLOSTAR ) Inject 80 Units into the skin daily.    Marland Kitchen losartan (COZAAR) 100 MG tablet Take 1.5 tablets (150 mg total) by mouth every morning. 45 tablet 9  . omeprazole (PRILOSEC OTC) 20 MG tablet Take 20 mg by mouth daily.    . rosuvastatin (CRESTOR) 10 MG tablet Take 10 mg by mouth every evening.    Marland Kitchen spironolactone (ALDACTONE) 25 MG tablet Take 1 tablet (25 mg total) by mouth daily. 30 tablet 11   No current  facility-administered medications for this visit.    Allergies:   Amlodipine besylate; Atorvastatin; Lisinopril; Metformin; Penicillins; Sulfa antibiotics; Welchol ; Dilaudid; and Sulfamethoxazole-trimethoprim    ROS:  Please see the history of present illness.   Otherwise, review of systems are positive for none.   All other systems are reviewed and negative.    PHYSICAL EXAM: VS:  BP 130/80 mmHg  Pulse 79  Ht  (1.575 m)  Wt 225 lb (102.059 kg)  BMI 41.14 kg/m2 , BMI Body mass index is 41.14 kg/(m^2). GENERAL:  Well appearing HEENT:  Pupils equal round and reactive, fundi not visualized, oral mucosa unremarkable NECK:  No jugular venous distention, waveform within normal limits, carotid upstroke brisk and symmetric, no bruits, no thyromegaly LYMPHATICS:  No cervical, inguinal adenopathy LUNGS:  Clear to auscultation bilaterally BACK:  No CVA tenderness CHEST:  Unremarkable HEART:  PMI not displaced or sustained,S1 and S2 within normal limits, no S3, no S4, no clicks, no rubs, brief right upper sternal border systolic murmur, no diastolic murmurs ABD:  Flat, positive bowel sounds normal in frequency in pitch, no bruits, no rebound, no guarding, no midline pulsatile mass, no hepatomegaly, no splenomegaly EXT:  2 plus pulses throughout, no edema, no cyanosis no clubbing    EKG:  EKG is ordered today. The ekg ordered today demonstrates sinus rhythm, rate 79, poor anterior R wave progression, no acute ST-T wave changes.   Recent Labs: 06/28/2014: ALT 9; Pro B Natriuretic peptide (BNP) 2230.0* 07/01/2014: BUN 41*; Creatinine, Ser 1.68*; Hemoglobin 9.8*; Platelets 335; Potassium 4.7; Sodium 136*    Lipid Panel No results found for: CHOL, TRIG, HDL, CHOLHDL, VLDL, LDLCALC, LDLDIRECT    Wt Readings from Last 3 Encounters:  04/18/15 225 lb (102.059 kg)  07/17/14 218 lb 14.4 oz (99.292 kg)  07/01/14 216 lb 3.2 oz (98.068 kg)      Other studies Reviewed: Additional studies/  records that were reviewed today include: Echo. Review of the above records demonstrates:  Please see elsewhere in the note.     ASSESSMENT AND PLAN:  DILATED CARDIOMYOPATHY:  Her ejection fraction seems to be improved. I'll follow an echocardiogram again in the spring. The Benicar is very expensive and I don't think she needs to take this in addition to the Cozaar. I will change this as below. We talked about salt and fluid restriction. She will get compression stockings.  HTN:  This is being managed in the context of treating his CHF.  I'm going to increase her Cozaar to 150 mg and stop the Benicar.   Current medicines are reviewed at length with the patient today.  The patient does  not have concerns regarding medicines.  The following changes have been made:  As above.   Labs/ tests ordered today include:   Orders Placed This Encounter  Procedures  . EKG 12-Lead     Disposition:   FU with me in the spring.     Signed, Rollene Rotunda, MD  04/19/2015 10:26 AM    [facility]

## 2015-04-20 ENCOUNTER — Encounter (HOSPITAL_COMMUNITY): Payer: Self-pay | Admitting: Emergency Medicine

## 2015-04-20 ENCOUNTER — Inpatient Hospital Stay (HOSPITAL_COMMUNITY)
Admission: EM | Admit: 2015-04-20 | Discharge: 2015-04-21 | DRG: 916 | Disposition: A | Discharge status: Home / Self Care | Payer: 59 | Attending: Internal Medicine | Admitting: Internal Medicine

## 2015-04-20 DIAGNOSIS — E119 Type 2 diabetes mellitus without complications: Secondary | ICD-10-CM

## 2015-04-20 DIAGNOSIS — I272 Other secondary pulmonary hypertension: Secondary | ICD-10-CM | POA: Diagnosis present

## 2015-04-20 DIAGNOSIS — I130 Hypertensive heart and chronic kidney disease with heart failure and stage 1 through stage 4 chronic kidney disease, or unspecified chronic kidney disease: Secondary | ICD-10-CM

## 2015-04-20 DIAGNOSIS — N189 Chronic kidney disease, unspecified: Secondary | ICD-10-CM

## 2015-04-20 DIAGNOSIS — N183 Chronic kidney disease, stage 3 unspecified: Secondary | ICD-10-CM | POA: Diagnosis present

## 2015-04-20 DIAGNOSIS — N12 Tubulo-interstitial nephritis, not specified as acute or chronic: Secondary | ICD-10-CM | POA: Diagnosis not present

## 2015-04-20 DIAGNOSIS — M1990 Unspecified osteoarthritis, unspecified site: Secondary | ICD-10-CM | POA: Diagnosis present

## 2015-04-20 DIAGNOSIS — E1122 Type 2 diabetes mellitus with diabetic chronic kidney disease: Secondary | ICD-10-CM | POA: Diagnosis present

## 2015-04-20 DIAGNOSIS — X58XXXA Exposure to other specified factors, initial encounter: Secondary | ICD-10-CM | POA: Diagnosis present

## 2015-04-20 DIAGNOSIS — Z880 Allergy status to penicillin: Secondary | ICD-10-CM

## 2015-04-20 DIAGNOSIS — Z833 Family history of diabetes mellitus: Secondary | ICD-10-CM

## 2015-04-20 DIAGNOSIS — N10 Acute tubulo-interstitial nephritis: Secondary | ICD-10-CM | POA: Diagnosis present

## 2015-04-20 DIAGNOSIS — Z794 Long term (current) use of insulin: Secondary | ICD-10-CM

## 2015-04-20 DIAGNOSIS — Z79899 Other long term (current) drug therapy: Secondary | ICD-10-CM

## 2015-04-20 DIAGNOSIS — F419 Anxiety disorder, unspecified: Secondary | ICD-10-CM | POA: Diagnosis not present

## 2015-04-20 DIAGNOSIS — IMO0002 Reserved for concepts with insufficient information to code with codable children: Secondary | ICD-10-CM | POA: Diagnosis present

## 2015-04-20 DIAGNOSIS — I509 Heart failure, unspecified: Secondary | ICD-10-CM

## 2015-04-20 DIAGNOSIS — I5022 Chronic systolic (congestive) heart failure: Secondary | ICD-10-CM | POA: Diagnosis present

## 2015-04-20 DIAGNOSIS — R21 Rash and other nonspecific skin eruption: Secondary | ICD-10-CM | POA: Diagnosis present

## 2015-04-20 DIAGNOSIS — N184 Chronic kidney disease, stage 4 (severe): Secondary | ICD-10-CM | POA: Diagnosis present

## 2015-04-20 DIAGNOSIS — J45909 Unspecified asthma, uncomplicated: Secondary | ICD-10-CM | POA: Diagnosis present

## 2015-04-20 DIAGNOSIS — E785 Hyperlipidemia, unspecified: Secondary | ICD-10-CM | POA: Diagnosis present

## 2015-04-20 DIAGNOSIS — E1169 Type 2 diabetes mellitus with other specified complication: Secondary | ICD-10-CM

## 2015-04-20 DIAGNOSIS — Z8249 Family history of ischemic heart disease and other diseases of the circulatory system: Secondary | ICD-10-CM

## 2015-04-20 DIAGNOSIS — G9340 Encephalopathy, unspecified: Secondary | ICD-10-CM

## 2015-04-20 DIAGNOSIS — Z888 Allergy status to other drugs, medicaments and biological substances status: Secondary | ICD-10-CM

## 2015-04-20 DIAGNOSIS — K219 Gastro-esophageal reflux disease without esophagitis: Secondary | ICD-10-CM | POA: Diagnosis present

## 2015-04-20 DIAGNOSIS — Z6841 Body Mass Index (BMI) 40.0 and over, adult: Secondary | ICD-10-CM

## 2015-04-20 DIAGNOSIS — T7840XA Allergy, unspecified, initial encounter: Principal | ICD-10-CM

## 2015-04-20 DIAGNOSIS — E1121 Type 2 diabetes mellitus with diabetic nephropathy: Secondary | ICD-10-CM | POA: Diagnosis present

## 2015-04-20 DIAGNOSIS — I429 Cardiomyopathy, unspecified: Secondary | ICD-10-CM | POA: Diagnosis present

## 2015-04-20 DIAGNOSIS — B9620 Unspecified Escherichia coli [E. coli] as the cause of diseases classified elsewhere: Secondary | ICD-10-CM | POA: Diagnosis present

## 2015-04-20 DIAGNOSIS — E669 Obesity, unspecified: Secondary | ICD-10-CM | POA: Diagnosis present

## 2015-04-20 DIAGNOSIS — M7989 Other specified soft tissue disorders: Secondary | ICD-10-CM | POA: Diagnosis present

## 2015-04-20 DIAGNOSIS — I10 Essential (primary) hypertension: Secondary | ICD-10-CM | POA: Diagnosis present

## 2015-04-20 DIAGNOSIS — Z882 Allergy status to sulfonamides status: Secondary | ICD-10-CM

## 2015-04-20 LAB — CBC WITH DIFFERENTIAL/PLATELET
BASOS ABS: 0 10*3/uL (ref 0.0–0.1)
Basophils Relative: 0 %
EOS ABS: 0 10*3/uL (ref 0.0–0.7)
Eosinophils Relative: 0 %
HCT: 33.2 % — ABNORMAL LOW (ref 36.0–46.0)
HEMOGLOBIN: 10.9 g/dL — AB (ref 12.0–15.0)
Lymphocytes Relative: 11 %
Lymphs Abs: 1.7 10*3/uL (ref 0.7–4.0)
MCH: 28.6 pg (ref 26.0–34.0)
MCHC: 32.8 g/dL (ref 30.0–36.0)
MCV: 87.1 fL (ref 78.0–100.0)
Monocytes Absolute: 0.9 10*3/uL (ref 0.1–1.0)
Monocytes Relative: 5 %
NEUTROS PCT: 84 %
Neutro Abs: 13.5 10*3/uL — ABNORMAL HIGH (ref 1.7–7.7)
Platelets: 387 10*3/uL (ref 150–400)
RBC: 3.81 MIL/uL — AB (ref 3.87–5.11)
RDW: 13.5 % (ref 11.5–15.5)
WBC: 16.1 10*3/uL — AB (ref 4.0–10.5)

## 2015-04-20 LAB — URINALYSIS, ROUTINE W REFLEX MICROSCOPIC
Bilirubin Urine: NEGATIVE
Glucose, UA: NEGATIVE mg/dL
Ketones, ur: NEGATIVE mg/dL
LEUKOCYTES UA: NEGATIVE
NITRITE: NEGATIVE
Protein, ur: 30 mg/dL — AB
Specific Gravity, Urine: 1.013 (ref 1.005–1.030)
Urobilinogen, UA: 0.2 mg/dL (ref 0.0–1.0)
pH: 5.5 (ref 5.0–8.0)

## 2015-04-20 LAB — I-STAT CHEM 8, ED
BUN: 20 mg/dL (ref 6–20)
CALCIUM ION: 1.15 mmol/L (ref 1.12–1.23)
Chloride: 106 mmol/L (ref 101–111)
Creatinine, Ser: 1.2 mg/dL — ABNORMAL HIGH (ref 0.44–1.00)
Glucose, Bld: 154 mg/dL — ABNORMAL HIGH (ref 65–99)
HCT: 33 % — ABNORMAL LOW (ref 36.0–46.0)
Hemoglobin: 11.2 g/dL — ABNORMAL LOW (ref 12.0–15.0)
POTASSIUM: 4.2 mmol/L (ref 3.5–5.1)
SODIUM: 141 mmol/L (ref 135–145)
TCO2: 21 mmol/L (ref 0–100)

## 2015-04-20 LAB — BRAIN NATRIURETIC PEPTIDE: B Natriuretic Peptide: 65.3 pg/mL (ref 0.0–100.0)

## 2015-04-20 LAB — URINE MICROSCOPIC-ADD ON

## 2015-04-20 LAB — TROPONIN I
TROPONIN I: 0.05 ng/mL — AB (ref <0.031)
TROPONIN I: 0.05 ng/mL — AB (ref <0.031)

## 2015-04-20 LAB — GLUCOSE, CAPILLARY: GLUCOSE-CAPILLARY: 170 mg/dL — AB (ref 65–99)

## 2015-04-20 LAB — CG4 I-STAT (LACTIC ACID): LACTIC ACID, VENOUS: 1.6 mmol/L (ref 0.5–2.0)

## 2015-04-20 MED ORDER — ACETAMINOPHEN 500 MG PO TABS
500.0000 mg | ORAL_TABLET | Freq: Once | ORAL | Status: AC
  Administered 2015-04-20: 500 mg via ORAL
  Filled 2015-04-20: qty 1

## 2015-04-20 MED ORDER — DIPHENHYDRAMINE HCL 50 MG PO TABS: 50.0000 mg | ORAL_TABLET | Freq: Every evening | ORAL | Status: DC | PRN

## 2015-04-20 MED ORDER — ENOXAPARIN SODIUM 40 MG/0.4ML ~~LOC~~ SOLN
40.0000 mg | SUBCUTANEOUS | Status: DC
  Administered 2015-04-20: 40 mg via SUBCUTANEOUS
  Filled 2015-04-20: qty 0.4

## 2015-04-20 MED ORDER — OMEPRAZOLE MAGNESIUM 20 MG PO TBEC: 20.0000 mg | DELAYED_RELEASE_TABLET | Freq: Every day | ORAL | Status: DC

## 2015-04-20 MED ORDER — MOMETASONE FURO-FORMOTEROL FUM 100-5 MCG/ACT IN AERO
2.0000 | INHALATION_SPRAY | Freq: Two times a day (BID) | RESPIRATORY_TRACT | Status: DC
  Administered 2015-04-20 – 2015-04-21 (×2): 2 via RESPIRATORY_TRACT
  Filled 2015-04-20: qty 8.8

## 2015-04-20 MED ORDER — HYDRALAZINE HCL 20 MG/ML IJ SOLN: 5.0000 mg | INTRAMUSCULAR | Status: DC | PRN

## 2015-04-20 MED ORDER — FLUTICASONE PROPIONATE 50 MCG/ACT NA SUSP
1.0000 | Freq: Every day | NASAL | Status: DC
  Administered 2015-04-20 – 2015-04-21 (×2): 1 via NASAL
  Filled 2015-04-20: qty 16

## 2015-04-20 MED ORDER — DEXTROSE 5 % IV SOLN: 1.0000 g | INTRAVENOUS | Status: DC

## 2015-04-20 MED ORDER — CARVEDILOL 6.25 MG PO TABS
6.2500 mg | ORAL_TABLET | Freq: Two times a day (BID) | ORAL | Status: DC
  Administered 2015-04-20 – 2015-04-21 (×2): 6.25 mg via ORAL
  Filled 2015-04-20 (×2): qty 1

## 2015-04-20 MED ORDER — ONDANSETRON HCL 4 MG PO TABS
4.0000 mg | ORAL_TABLET | Freq: Once | ORAL | Status: AC
  Administered 2015-04-20: 4 mg via ORAL
  Filled 2015-04-20: qty 1

## 2015-04-20 MED ORDER — DEXTROSE 5 % IV SOLN
1.0000 g | INTRAVENOUS | Status: DC
  Administered 2015-04-21: 1 g via INTRAVENOUS
  Filled 2015-04-20: qty 10

## 2015-04-20 MED ORDER — ACETAMINOPHEN 325 MG PO TABS
650.0000 mg | ORAL_TABLET | Freq: Four times a day (QID) | ORAL | Status: DC | PRN
  Administered 2015-04-20 – 2015-04-21 (×2): 650 mg via ORAL
  Filled 2015-04-20: qty 2

## 2015-04-20 MED ORDER — INSULIN ASPART 100 UNIT/ML ~~LOC~~ SOLN: 0.0000 [IU] | Freq: Every day | SUBCUTANEOUS | Status: DC

## 2015-04-20 MED ORDER — SODIUM CHLORIDE 0.9 % IV BOLUS (SEPSIS)
500.0000 mL | Freq: Once | INTRAVENOUS | Status: AC
  Administered 2015-04-20: 500 mL via INTRAVENOUS

## 2015-04-20 MED ORDER — SPIRONOLACTONE 25 MG PO TABS
25.0000 mg | ORAL_TABLET | Freq: Every day | ORAL | Status: DC
  Administered 2015-04-21: 25 mg via ORAL
  Filled 2015-04-20: qty 1

## 2015-04-20 MED ORDER — LORATADINE 10 MG PO TABS
10.0000 mg | ORAL_TABLET | Freq: Every day | ORAL | Status: DC
  Filled 2015-04-20: qty 1

## 2015-04-20 MED ORDER — PANTOPRAZOLE SODIUM 40 MG PO TBEC
40.0000 mg | DELAYED_RELEASE_TABLET | Freq: Every day | ORAL | Status: DC
  Filled 2015-04-20: qty 1

## 2015-04-20 MED ORDER — DIPHENHYDRAMINE HCL 50 MG/ML IJ SOLN: 12.5000 mg | Freq: Four times a day (QID) | INTRAMUSCULAR | Status: DC | PRN

## 2015-04-20 MED ORDER — INSULIN GLARGINE 100 UNIT/ML ~~LOC~~ SOLN
50.0000 [IU] | Freq: Every day | SUBCUTANEOUS | Status: DC
  Administered 2015-04-20: 50 [IU] via SUBCUTANEOUS
  Filled 2015-04-20 (×2): qty 0.5

## 2015-04-20 MED ORDER — ALBUTEROL SULFATE (2.5 MG/3ML) 0.083% IN NEBU: 2.5000 mg | INHALATION_SOLUTION | Freq: Four times a day (QID) | RESPIRATORY_TRACT | Status: DC | PRN

## 2015-04-20 MED ORDER — FAMOTIDINE IN NACL 20-0.9 MG/50ML-% IV SOLN
20.0000 mg | Freq: Two times a day (BID) | INTRAVENOUS | Status: DC
  Administered 2015-04-20 – 2015-04-21 (×2): 20 mg via INTRAVENOUS
  Filled 2015-04-20 (×4): qty 50

## 2015-04-20 MED ORDER — ALPRAZOLAM 0.5 MG PO TABS
0.5000 mg | ORAL_TABLET | Freq: Three times a day (TID) | ORAL | Status: DC | PRN
  Administered 2015-04-20: 0.5 mg via ORAL
  Filled 2015-04-20: qty 1

## 2015-04-20 MED ORDER — DEXTROSE 5 % IV SOLN
1.0000 g | Freq: Once | INTRAVENOUS | Status: AC
  Administered 2015-04-20: 1 g via INTRAVENOUS
  Filled 2015-04-20: qty 10

## 2015-04-20 MED ORDER — ROSUVASTATIN CALCIUM 10 MG PO TABS
10.0000 mg | ORAL_TABLET | Freq: Every evening | ORAL | Status: DC
  Administered 2015-04-20: 10 mg via ORAL
  Filled 2015-04-20 (×2): qty 1

## 2015-04-20 MED ORDER — ONDANSETRON HCL 4 MG/2ML IJ SOLN: 4.0000 mg | Freq: Four times a day (QID) | INTRAMUSCULAR | Status: DC | PRN

## 2015-04-20 MED ORDER — INSULIN ASPART 100 UNIT/ML ~~LOC~~ SOLN
0.0000 [IU] | Freq: Three times a day (TID) | SUBCUTANEOUS | Status: DC
  Administered 2015-04-21: 2 [IU] via SUBCUTANEOUS

## 2015-04-20 MED ORDER — LOSARTAN POTASSIUM 50 MG PO TABS
150.0000 mg | ORAL_TABLET | Freq: Every morning | ORAL | Status: DC
  Administered 2015-04-21: 150 mg via ORAL
  Filled 2015-04-20: qty 3

## 2015-04-20 MED ORDER — SODIUM CHLORIDE 0.9 % IJ SOLN: 3.0000 mL | Freq: Two times a day (BID) | INTRAMUSCULAR | Status: DC

## 2015-04-20 MED ORDER — FUROSEMIDE 40 MG PO TABS
40.0000 mg | ORAL_TABLET | Freq: Two times a day (BID) | ORAL | Status: DC
  Administered 2015-04-21: 40 mg via ORAL
  Filled 2015-04-20 (×2): qty 1

## 2015-04-20 MED ORDER — ALBUTEROL SULFATE HFA 108 (90 BASE) MCG/ACT IN AERS: 1.0000 | INHALATION_SPRAY | Freq: Four times a day (QID) | RESPIRATORY_TRACT | Status: DC | PRN

## 2015-04-20 MED ORDER — SODIUM CHLORIDE 0.9 % IV BOLUS (SEPSIS): 1000.0000 mL | Freq: Once | INTRAVENOUS | Status: DC

## 2015-04-20 NOTE — Discharge Instructions (Signed)
Please stop taking the macrobid   Drug Allergy Allergic reactions to medicines are common. Some allergic reactions are mild. A delayed type of drug allergy that occurs 1 week or more after exposure to a medicine or vaccine is called serum sickness. A life-threatening, sudden (acute) allergic reaction that involves the whole body is called anaphylaxis. CAUSES  "True" drug allergies occur when there is an allergic reaction to a medicine. This is caused by overactivity of the immune system. First, the body becomes sensitized. The immune system is triggered by your first exposure to the medicine. Following this first exposure, future exposure to the same medicine may be life-threatening. Almost any medicine can cause an allergic reaction. Common ones are:  Penicillin.  Sulfonamides (sulfa drugs).  Local anesthetics.  X-ray dyes that contain iodine. SYMPTOMS  Common symptoms of a minor allergic reaction are:  Swelling around the mouth.  An itchy red rash or hives.  Vomiting or diarrhea. Anaphylaxis can cause swelling of the mouth and throat. This makes it difficult to breathe and swallow. Severe reactions can be fatal within seconds, even after exposure to only a trace amount of the drug that causes the reaction. HOME CARE INSTRUCTIONS  If you are unsure of what caused your reaction, write down:  The names of the medicines you took.  How much medicine you took.  How you took the medicine, such as whether you took a pill, injected the medicine, or applied it to your skin.  All of the things you ate and drank.  The date and time of your reaction.  The symptoms of the reaction.  You may want to follow up with an allergy specialist after the reaction has cleared in order to be tested to confirm the allergy. It is important to confirm that your reaction is an allergy, not just a side effect to the medicine. If you have a true allergy to a medicine, this may prevent that medicine and  related medicines from being given to you when you are very ill.  If you have hives or a rash:  Take medicines as directed by your caregiver.  You may use an over-the-counter antihistamine (diphenhydramine) as needed.  Apply cold compresses to the skin or take baths in cool water. Avoid hot baths or showers.  If you are severely allergic:  Continuous observation after a severe reaction may be needed. Hospitalization is often required.  Wear a medical alert bracelet or necklace stating your allergy.  You and your family must learn how to use an anaphylaxis kit or give an epinephrine injection to temporarily treat an emergency allergic reaction. If you have had a severe reaction, always carry your epinephrine injection or anaphylaxis kit with you. This can be lifesaving if you have a severe reaction.  Do not drive or perform tasks after treatment until the medicines used to treat your reaction have worn off, or until your caregiver says it is okay.  If you have a drug allergy that was confirmed by your health care provider:  Carry information about the drug allergy with you at all times.  Always check with a pharmacist before taking any over-the-counter medicine. SEEK MEDICAL CARE IF:   You think you had an allergic reaction. Symptoms usually start within 30 minutes after exposure.  Symptoms are getting worse rather than better.  You develop new symptoms.  The symptoms that brought you to your caregiver return. SEEK IMMEDIATE MEDICAL CARE IF:   You have swelling of the mouth, difficulty breathing,  or wheezing.  You have a tight feeling in your chest or throat.  You develop hives, swelling, or itching all over your body.  You develop severe vomiting or diarrhea.  You feel faint or pass out. This is an emergency. Use your epinephrine injection or anaphylaxis kit as you have been instructed. Call for emergency medical help. Even if you improve after the injection, you need to  be examined at a hospital emergency department. MAKE SURE YOU:   Understand these instructions.  Will watch your condition.  Will get help right away if you are not doing well or get worse.   This information is not intended to replace advice given to you by your health care provider. Make sure you discuss any questions you have with your health care provider.   Document Released: 06/29/2005 Document Revised: 07/20/2014 Document Reviewed: 01/29/2015 Elsevier Interactive Patient Education [employer].

## 2015-04-20 NOTE — ED Notes (Signed)
Pt states that she has just started on Cipro and Macrobid for a UTI recently and thinks that she may have had an allergic reaction this morning. Pt reports that she had a sudden onset rash to abd, chest and arms. Pt also had a sudden onset nausea and, swelling on feet and hands, and tingling all over. Pt is alert and oriented, vss NAD. No respiratory distress noted.

## 2015-04-20 NOTE — ED Notes (Signed)
Pt here from home with c/o allergic reaction , pt recently started an antibiotic pt received benadryl , zantac and zofran from  EMS

## 2015-04-20 NOTE — H&P (Addendum)
History and Physical    Shelly [name] WJX:[id number] DOB: 1955-10-22 DOA: 04/20/2015  Referring physician: Dr. Jordan Likes PCP: Lupita Raider, MD  Specialists: none  Chief Complaint: back pain / fever / allergic reaction  HPI: Shelly [name] is a 59 y.o. female has a past medical history significant for hypertension, hyperlipidemia, chronic systolic heart failure, diabetes mellitus, obesity, presents to the emergency room with chief complaints of "out of body experience" that started this morning and lasted for a few minutes. Patient doesn't have full recollection of events.she also endorses having an allergic reaction with her rash on her abdomen, accompanied by swelling her arms and feet. She hasn't taken any of her home medications today and hasn't eaten anything this morning except for coffee. She was diagnosed with a UTI 2 days ago (she had dysuria about a week ago, saw her PCP and had a urinalysis done, and she was called that the urine culture was positive and was placed on Macrobid). She took a dose of Macrobid on Thursday evening and 2 doses on Friday. She denies any chest pain, denies any shortness of breath, denies any palpitation, she has chronic abdominal pain, endorses nausea, also endorses fever and chills over the last week. She denies any lightheadedness or dizziness. She denies any weight gain or fluid buildup in her legs. On EMS arrival at her home, she has received benadryl, famotidine with her rash resolving however hand and feet swelling are still persistent. In the ED, patient was febrile to 101.4, not tachycardic nor hypotensive, she has a leukocytosis to 16K, mild Cr elevation and a troponin of 0.05. UA was fairly normal. [facility] asked for admission for pyelonephritis given back pain / fever and allergic reaction.   Review of Systems: As per history of present illness, otherwise 10 point review of systems negative   Past Medical History  Diagnosis Date  . Diabetes mellitus   .  Hypertension   . Hyperlipemia   . Acid reflux   . Anxiety   . Obesity   . Asthma     infrequent problems  . Arthritis   . Rash     sides of neck  . Stress incontinence   . Complication of anesthesia     "I quit breathing during surgery" 2008 - has had surgery since with no problems   . PONV (postoperative nausea and vomiting)   . Pneumonia 12/2013  . Tuberculosis 1985  . Headache     hx of migraines  . Hepatitis     hx of dx during ruptured appendix hospitalization  . Irritable bowel syndrome   . Endometriosis     hx of   . Cardiomyopathy [facility])    Past Surgical History  Procedure Laterality Date  . Cholecystectomy    . Abdominal hysterectomy    . Abdominal adhesion surgery      x 7  . Ruptured appendix surgery    . Colon surgery  2010    colon resection due to adhesions  . Partial knee arthroplasty Left 10/27/2013    Procedure: LEFT KNEE UNICOMPARTMENTAL REPLACEMENT ;  Surgeon: Loanne Drilling, MD;  Location: [facility] ORS;  Service: Orthopedics;  Laterality: Left;  . Appendectomy    . Tonsillectomy    . Partial knee arthroplasty Right 05/21/2014    Procedure: MEDIAL UNICOMPARTMENTAL RIGHT KNEE ARTHROPLASTY;  Surgeon: Loanne Drilling, MD;  Location: [facility] ORS;  Service: Orthopedics;  Laterality: Right;  . Left and right heart catheterization with coronary angiogram N/A 06/29/2014  Procedure: LEFT AND RIGHT HEART CATHETERIZATION WITH CORONARY ANGIOGRAM;  Surgeon: Corky Crafts, MD;  Location: [facility] CATH LAB;  Service: Cardiovascular;  Laterality: N/A;   Social History:  reports that she has never smoked. She has never used smokeless tobacco. She reports that she drinks alcohol. She reports that she does not use illicit drugs.  Allergies  Allergen Reactions  . Amlodipine Besylate     Other reaction(s): fatgiue and nausea  . Atorvastatin     Other reaction(s): strange feelings  . Lisinopril Cough  . Metformin Diarrhea  . Penicillins Other (See Comments)    Other  reaction(s): rash Unknown reaction.   . Sulfa Antibiotics Nausea And Vomiting  . Welchol  [Colesevelam Hcl]     Other reaction(s): constipation  . Dilaudid [Hydromorphone Hcl] Anxiety and Other (See Comments)    paranoia  . Sulfamethoxazole-Trimethoprim Rash   Family History  Problem Relation Age of Onset  . Liver cancer Brother   . Heart disease Father   . Diabetes Father   . Heart failure Mother    Prior to Admission medications   Medication Sig Start Date End Date Taking? Authorizing Provider  ADVAIR DISKUS 250-50 MCG/DOSE AEPB Inhale 1 puff into the lungs 2 (two) times daily. 05/31/14  Yes Historical Provider, MD  albuterol (PROVENTIL HFA;VENTOLIN HFA) 108 (90 BASE) MCG/ACT inhaler Inhale 1 puff into the lungs every 6 (six) hours as needed for wheezing or shortness of breath.   Yes Historical Provider, MD  ALPRAZolam Prudy Feeler) 0.5 MG tablet Take 0.5 mg by mouth 3 (three) times daily as needed. Anxiety   Yes Historical Provider, MD  carvedilol (COREG) 6.25 MG tablet Take 1 tablet by mouth 2 (two) times daily. 02/20/15  Yes Historical Provider, MD  cetirizine (ZYRTEC) 10 MG tablet Take 5 mg by mouth daily.    Yes Historical Provider, MD  fluticasone (FLONASE) 50 MCG/ACT nasal spray Place 1 spray into both nostrils daily as needed. For allergy nasal congestion 05/31/14  Yes Historical Provider, MD  furosemide (LASIX) 40 MG tablet Take 1 tablet (40 mg total) by mouth 2 (two) times daily. 07/25/14  Yes Dwana Melena, PA-C  Insulin Glargine (TOUJEO SOLOSTAR [place]) Inject 80 Units into the skin daily.   Yes Historical Provider, MD  losartan (COZAAR) 100 MG tablet Take 1.5 tablets (150 mg total) by mouth every morning. 04/18/15  Yes Rollene Rotunda, MD  nitrofurantoin, macrocrystal-monohydrate, (MACROBID) 100 MG capsule Take 100 mg by mouth 2 (two) times daily. For 7 days. Started on 04-16-15 04/16/15  Yes Historical Provider, MD  omeprazole (PRILOSEC OTC) 20 MG tablet Take 20 mg by mouth daily.   Yes  Historical Provider, MD  rosuvastatin (CRESTOR) 10 MG tablet Take 10 mg by mouth every evening.   Yes Historical Provider, MD  spironolactone (ALDACTONE) 25 MG tablet Take 1 tablet (25 mg total) by mouth daily. 07/25/14  Yes Dwana Melena, PA-C  diphenhydrAMINE (BENADRYL) 50 MG tablet Take 1 tablet (50 mg total) by mouth at bedtime as needed for itching. 04/20/15   Myra Rude, MD   Physical Exam: Filed Vitals:   04/20/15 1521 04/20/15 1530 04/20/15 1545 04/20/15 1611  BP:  194/106 189/84   Pulse:  87 86   Temp: 99.1 F (37.3 C)   101.4 F (38.6 C)  TempSrc: Oral     Resp:  23 19   SpO2:  94% 96%      GENERAL: NAD  HEENT: head NCAT, no scleral icterus. Pupils round  and reactive. Mucous membranes are moist. Posterior pharynx clear of any exudate or lesions.  NECK: Supple.   LUNGS: Clear to auscultation. No wheezing or crackles  HEART: Regular rate and rhythm without murmur. 2+ pulses, no JVD, trace peripheral edema  ABDOMEN: Soft, nontender, and nondistended. Positive bowel sounds. No hepatosplenomegaly was noted.  EXTREMITIES: Without any cyanosis, clubbing, rash, lesions or edema.  NEUROLOGIC: Alert and oriented x3. Cranial nerves II through XII are grossly intact. Strength 5/5 in all 4.  PSYCHIATRIC: Normal mood and affect  SKIN: No ulceration or induration present. No rashes appreciated  Labs on Admission:  Basic Metabolic Panel:  Recent Labs Lab 04/20/15 1345  NA 141  K 4.2  CL 106  GLUCOSE 154*  BUN 20  CREATININE 1.20*   CBC:  Recent Labs Lab 04/20/15 1345 04/20/15 1353  WBC  --  16.1*  NEUTROABS  --  13.5*  HGB 11.2* 10.9*  HCT 33.0* 33.2*  MCV  --  87.1  PLT  --  387   Cardiac Enzymes:  Recent Labs Lab 04/20/15 1353  TROPONINI 0.05*    BNP (last 3 results)  Recent Labs  04/20/15 1353  BNP 65.3    CBG: No results for input(s): GLUCAP in the last 168 hours.  Radiological Exams on Admission: No results found.  EKG:  Independently reviewed. Sinus rhythm  Assessment/Plan Active Problems:   Hypertension   Hyperlipemia   Acid reflux   Anxiety   Obesity   Diabetes mellitus (HCC)   Asthma   Chronic kidney disease, stage III (moderate)   Hypertensive heart and chronic kidney disease with heart failure and stage 1 through stage 4 chronic kidney disease, or chronic kidney disease (HCC)   Secondary pulmonary hypertension (HCC)   Type 2 diabetes mellitus with diabetic nephropathy (HCC)   Pyelonephritis   Chronic systolic (congestive) heart failure (HCC)    Allergic reaction - unclear trigger, the only new medication is Macrobid however took it for 3 doses and she was 12 hours out of her last dose when her reaction happened so it is very unlikely that this is the trigger. No reported bug bites or foods prior other than coffee.  - continue famotidine scheduled, benadryl prn. Hold steroids for now but if rash returns that would be next step  Pyelonephritis - Ceftriaxone, could not access records from PCP office to see cultures, I was able to talk to on call Eagle MD, and cultures showed E coli sensitive to Augmentin, Cefepime, Ceftriaxone, Cefuroxime, Bactrim, Tetracyclines, Macrobid/   HTN - resume home medications, she is hypertensive in the ED since has not taken any of her home meds, she is asymptomatic - prn IV hydralazine  Chronic systolic heart failure - last 2D echo with EF of 50-55% in March 2016 improvement from previous EF of 25-30% last December - She appears euvolemic on exam, has lower extremity and hand swelling however states that he appeared suddenly this morning with her allergic reaction. - She has a mild troponin leak which is likely due to febrile illness, but given history of obtain a repeat troponin later tonight. No chest pain, no EKG changes  Chronic kidney disease stage III - Creatinine is at baseline, continue home medications  Type 2 diabetes mellitus - Resume her home  Lantus at a lower dose, add sliding scale insulin   Asthma - Stable, no wheezing, continue home inhalers  Hyperlipidemia - Continue statin  Anxiety - Continue home medications   Diet: carb modified Fluids:  none DVT Prophylaxis: Lovenox  Code Status: Full Family Communication: d/w daughter bedside Disposition Plan: admit to tele, obs   Shenetta Schnackenberg M. Elvera Lennox, MD [facility] Pager [phone]  If 7PM-7AM, please contact night-coverage [email or web address] Password [facility] 04/20/2015, 4:47 PM

## 2015-04-20 NOTE — ED Notes (Signed)
The pts bp is higher since she arrived.  She reports that she has not taken her bp meds today

## 2015-04-20 NOTE — ED Provider Notes (Signed)
CSN: [id number]     Arrival date & time 04/20/15  1234 History   First MD Initiated Contact with Patient 04/20/15 1245     Chief Complaint  Patient presents with  . Allergic Reaction     (Consider location/radiation/quality/duration/timing/severity/associated sxs/prior Treatment) Patient is a 59 y.o. female presenting with allergic reaction. The history is provided by the patient.  Allergic Reaction Presenting symptoms: no difficulty breathing, no difficulty swallowing, no rash and no wheezing   Prior allergic episodes:  No prior episodes Context: no animal exposure, no food allergies and no medications   Worsened by:  Nothing tried   Shelly [name] is a 59 yo F PMH DM, HTN, HFPEF, HLD, obesity, and anxiety. She reports being normal until sometime this morning she had an out of body experience while she was drinking her coffee. She called to one of her grandchildren and felt like she was looking down on her body. She doesn't remember conversations during this experience.  One of her grandchildren called EMS and was given benadryl, zantac and zofran in route and currently feels much better.  Reports that she had a rash on her stomach that was seen by EMS. Reports that her fingers and her feet are more swollen than usual. She is having some itchiness in her feet. Currently she is having some tingling that is going throughout her body. She denies any chest pain or shortness of breath or changes in her bowel movements. She does endorse some nausea, dysuria and low back pain and chills.  Feels like she may have something in her throat but has no trouble swallowing. She does have some abdominal fullness but has a long history of abdominal repairs. Her last bowel movement was this morning. She denies any abdominal pain. Report of recent UTI with being placed on Macrobid. She denies taking any of her medications today. She denies starting any new medications and has not taken her diabetic medications  today.  Past Medical History  Diagnosis Date  . Diabetes mellitus   . Hypertension   . Hyperlipemia   . Acid reflux   . Anxiety   . Obesity   . Asthma     infrequent problems  . Arthritis   . Rash     sides of neck  . Stress incontinence   . Complication of anesthesia     "I quit breathing during surgery" 2008 - has had surgery since with no problems   . PONV (postoperative nausea and vomiting)   . Pneumonia 12/2013  . Tuberculosis 1985  . Headache     hx of migraines  . Hepatitis     hx of dx during ruptured appendix hospitalization  . Irritable bowel syndrome   . Endometriosis     hx of   . Cardiomyopathy [facility])    Past Surgical History  Procedure Laterality Date  . Cholecystectomy    . Abdominal hysterectomy    . Abdominal adhesion surgery      x 7  . Ruptured appendix surgery    . Colon surgery  2010    colon resection due to adhesions  . Partial knee arthroplasty Left 10/27/2013    Procedure: LEFT KNEE UNICOMPARTMENTAL REPLACEMENT ;  Surgeon: Loanne Drilling, MD;  Location: [facility] ORS;  Service: Orthopedics;  Laterality: Left;  . Appendectomy    . Tonsillectomy    . Partial knee arthroplasty Right 05/21/2014    Procedure: MEDIAL UNICOMPARTMENTAL RIGHT KNEE ARTHROPLASTY;  Surgeon: Loanne Drilling, MD;  Location:  [facility] ORS;  Service: Orthopedics;  Laterality: Right;  . Left and right heart catheterization with coronary angiogram N/A 06/29/2014    Procedure: LEFT AND RIGHT HEART CATHETERIZATION WITH CORONARY ANGIOGRAM;  Surgeon: Corky Crafts, MD;  Location: [facility] CATH LAB;  Service: Cardiovascular;  Laterality: N/A;   Family History  Problem Relation Age of Onset  . Liver cancer Brother   . Heart disease Father   . Diabetes Father   . Heart failure Mother    Social History  Substance Use Topics  . Smoking status: Never Smoker   . Smokeless tobacco: Never Used  . Alcohol Use: Yes     Comment: occ   OB History    No data available     Review of Systems   Constitutional: Positive for chills. Negative for fever.  HENT: Negative for facial swelling and trouble swallowing.   Respiratory: Negative for cough, choking, shortness of breath and wheezing.   Cardiovascular: Negative for chest pain.  Gastrointestinal: Positive for nausea and vomiting. Negative for abdominal pain, diarrhea and constipation.  Genitourinary: Positive for dysuria.  Musculoskeletal: Positive for back pain. Negative for neck pain.  Skin: Negative for rash.  Neurological: Negative for syncope and light-headedness.      Allergies  Amlodipine besylate; Atorvastatin; Lisinopril; Metformin; Penicillins; Sulfa antibiotics; Welchol ; Dilaudid; and Sulfamethoxazole-trimethoprim  Home Medications   Prior to Admission medications   Medication Sig Start Date End Date Taking? Authorizing Provider  ADVAIR DISKUS 250-50 MCG/DOSE AEPB Inhale 1 puff into the lungs 2 (two) times daily. 05/31/14  Yes Historical Provider, MD  albuterol (PROVENTIL HFA;VENTOLIN HFA) 108 (90 BASE) MCG/ACT inhaler Inhale 1 puff into the lungs every 6 (six) hours as needed for wheezing or shortness of breath.   Yes Historical Provider, MD  ALPRAZolam Prudy Feeler) 0.5 MG tablet Take 0.5 mg by mouth 3 (three) times daily as needed. Anxiety   Yes Historical Provider, MD  carvedilol (COREG) 6.25 MG tablet Take 1 tablet by mouth 2 (two) times daily. 02/20/15  Yes Historical Provider, MD  cetirizine (ZYRTEC) 10 MG tablet Take 5 mg by mouth daily.    Yes Historical Provider, MD  fluticasone (FLONASE) 50 MCG/ACT nasal spray Place 1 spray into both nostrils daily as needed. For allergy nasal congestion 05/31/14  Yes Historical Provider, MD  furosemide (LASIX) 40 MG tablet Take 1 tablet (40 mg total) by mouth 2 (two) times daily. 07/25/14  Yes Dwana Melena, PA-C  Insulin Glargine (TOUJEO SOLOSTAR [place]) Inject 80 Units into the skin daily.   Yes Historical Provider, MD  losartan (COZAAR) 100 MG tablet Take 1.5 tablets (150 mg  total) by mouth every morning. 04/18/15  Yes Rollene Rotunda, MD  nitrofurantoin, macrocrystal-monohydrate, (MACROBID) 100 MG capsule Take 100 mg by mouth 2 (two) times daily. For 7 days. Started on 04-16-15 04/16/15  Yes Historical Provider, MD  omeprazole (PRILOSEC OTC) 20 MG tablet Take 20 mg by mouth daily.   Yes Historical Provider, MD  rosuvastatin (CRESTOR) 10 MG tablet Take 10 mg by mouth every evening.   Yes Historical Provider, MD  spironolactone (ALDACTONE) 25 MG tablet Take 1 tablet (25 mg total) by mouth daily. 07/25/14  Yes Dwana Melena, PA-C  diphenhydrAMINE (BENADRYL) 50 MG tablet Take 1 tablet (50 mg total) by mouth at bedtime as needed for itching. 04/20/15   Myra Rude, MD   BP 189/84 mmHg  Pulse 86  Temp(Src) 101.4 F (38.6 C) (Oral)  Resp 19  SpO2 96% Physical Exam  Constitutional: She is oriented to person, place, and time. She appears well-developed and well-nourished. No distress.  Rigors   HENT:  Head: Normocephalic and atraumatic.  Eyes: Conjunctivae and EOM are normal.  Neck: Normal range of motion. Neck supple.  Cardiovascular: Normal rate, regular rhythm, normal heart sounds and intact distal pulses.   No murmur heard. Pulmonary/Chest: Effort normal and breath sounds normal. No respiratory distress. She has no wheezes.  Abdominal: Soft. Bowel sounds are normal. She exhibits no distension. There is no tenderness. There is no rebound and no guarding.  Musculoskeletal: Normal range of motion.  +1 pitting edema in b/l LE  Some swelling of digits b/l   Neurological: She is alert and oriented to person, place, and time.  Skin: Skin is warm. No rash noted.  Psychiatric: She has a normal mood and affect.    ED Course  Procedures (including critical care time) Labs Review Labs Reviewed  TROPONIN I - Abnormal; Notable for the following:    Troponin I 0.05 (*)    All other components within normal limits  CBC WITH DIFFERENTIAL/PLATELET - Abnormal; Notable for  the following:    WBC 16.1 (*)    RBC 3.81 (*)    Hemoglobin 10.9 (*)    HCT 33.2 (*)    Neutro Abs 13.5 (*)    All other components within normal limits  URINALYSIS, ROUTINE W REFLEX MICROSCOPIC (NOT AT [facility]) - Abnormal; Notable for the following:    APPearance CLOUDY (*)    Hgb urine dipstick SMALL (*)    Protein, ur 30 (*)    All other components within normal limits  URINE MICROSCOPIC-ADD ON - Abnormal; Notable for the following:    Squamous Epithelial / LPF MANY (*)    Bacteria, UA MANY (*)    All other components within normal limits  I-STAT CHEM 8, ED - Abnormal; Notable for the following:    Creatinine, Ser 1.20 (*)    Glucose, Bld 154 (*)    Hemoglobin 11.2 (*)    HCT 33.0 (*)    All other components within normal limits  URINE CULTURE  CULTURE, BLOOD (SINGLE)  CULTURE, BLOOD (SINGLE)  BRAIN NATRIURETIC PEPTIDE  I-STAT CG4 LACTIC ACID, ED    Imaging Review No results found. I have personally reviewed and evaluated these images and lab results as part of my medical decision-making.   EKG Interpretation   Date/Time:  Saturday April 20 2015 13:46:05 EDT Ventricular Rate:  75 PR Interval:  163 QRS Duration: 93 QT Interval:  417 QTC Calculation: 466 R Axis:   74 Text Interpretation:  Sinus rhythm Anterior infarct, old No significant  change since last tracing Confirmed by BEATON  MD, ROBERT [phone]) on  04/20/2015 1:56:26 PM      MDM   Final diagnoses:  Allergic reaction, initial encounter  Pyelonephritis    Shelly [name] is presenting with reported allergic reaction and rigors and fever.  Rash has resolved with presentation to the ED.  Has a chronic leukocytosis but worsened today. Febrile to 101 after tylenol and rigors on exam. Recent urine culture showing bacteria that was diagnosed on Tuesday but she only started taking macrobid yesterday. She started having odd sensation as of this morning. Urine culture pending. Blood culture x 2 and 1 g rocephin.  History of HFPEF as she has a slight elvated troponin. Admit to hospitalist.    Myra Rude, MD PGY-3, [facility] 04/20/2015, 4:12 PM     Riki Rusk  Redmond Baseman, MD 04/20/15 1554  Myra Rude, MD 04/20/15 1620  Nelva Nay, MD 04/21/15 [phone]

## 2015-04-20 NOTE — ED Notes (Signed)
The pt c/o being too cold blankets on the pt.  Alert asking what has been decided.

## 2015-04-20 NOTE — ED Notes (Signed)
Rocephin infused pt still has a headache

## 2015-04-21 ENCOUNTER — Inpatient Hospital Stay (HOSPITAL_COMMUNITY): Payer: 59

## 2015-04-21 DIAGNOSIS — I5022 Chronic systolic (congestive) heart failure: Secondary | ICD-10-CM

## 2015-04-21 DIAGNOSIS — N183 Chronic kidney disease, stage 3 (moderate): Secondary | ICD-10-CM | POA: Diagnosis present

## 2015-04-21 DIAGNOSIS — N12 Tubulo-interstitial nephritis, not specified as acute or chronic: Secondary | ICD-10-CM | POA: Diagnosis present

## 2015-04-21 DIAGNOSIS — Z833 Family history of diabetes mellitus: Secondary | ICD-10-CM | POA: Diagnosis not present

## 2015-04-21 DIAGNOSIS — B9620 Unspecified Escherichia coli [E. coli] as the cause of diseases classified elsewhere: Secondary | ICD-10-CM | POA: Diagnosis present

## 2015-04-21 DIAGNOSIS — T7840XA Allergy, unspecified, initial encounter: Secondary | ICD-10-CM | POA: Diagnosis present

## 2015-04-21 DIAGNOSIS — Z8249 Family history of ischemic heart disease and other diseases of the circulatory system: Secondary | ICD-10-CM | POA: Diagnosis not present

## 2015-04-21 DIAGNOSIS — I130 Hypertensive heart and chronic kidney disease with heart failure and stage 1 through stage 4 chronic kidney disease, or unspecified chronic kidney disease: Secondary | ICD-10-CM | POA: Diagnosis present

## 2015-04-21 DIAGNOSIS — J45909 Unspecified asthma, uncomplicated: Secondary | ICD-10-CM | POA: Diagnosis present

## 2015-04-21 DIAGNOSIS — E1121 Type 2 diabetes mellitus with diabetic nephropathy: Secondary | ICD-10-CM | POA: Diagnosis present

## 2015-04-21 DIAGNOSIS — E1122 Type 2 diabetes mellitus with diabetic chronic kidney disease: Secondary | ICD-10-CM | POA: Diagnosis present

## 2015-04-21 DIAGNOSIS — Z794 Long term (current) use of insulin: Secondary | ICD-10-CM | POA: Diagnosis not present

## 2015-04-21 DIAGNOSIS — I429 Cardiomyopathy, unspecified: Secondary | ICD-10-CM | POA: Diagnosis present

## 2015-04-21 DIAGNOSIS — X58XXXA Exposure to other specified factors, initial encounter: Secondary | ICD-10-CM | POA: Diagnosis present

## 2015-04-21 DIAGNOSIS — Z6841 Body Mass Index (BMI) 40.0 and over, adult: Secondary | ICD-10-CM | POA: Diagnosis not present

## 2015-04-21 DIAGNOSIS — N10 Acute tubulo-interstitial nephritis: Secondary | ICD-10-CM | POA: Diagnosis present

## 2015-04-21 DIAGNOSIS — R21 Rash and other nonspecific skin eruption: Secondary | ICD-10-CM | POA: Diagnosis present

## 2015-04-21 DIAGNOSIS — F419 Anxiety disorder, unspecified: Secondary | ICD-10-CM | POA: Diagnosis present

## 2015-04-21 DIAGNOSIS — T7840XD Allergy, unspecified, subsequent encounter: Secondary | ICD-10-CM

## 2015-04-21 DIAGNOSIS — K219 Gastro-esophageal reflux disease without esophagitis: Secondary | ICD-10-CM | POA: Diagnosis present

## 2015-04-21 DIAGNOSIS — M7989 Other specified soft tissue disorders: Secondary | ICD-10-CM | POA: Diagnosis present

## 2015-04-21 DIAGNOSIS — Z888 Allergy status to other drugs, medicaments and biological substances status: Secondary | ICD-10-CM | POA: Diagnosis not present

## 2015-04-21 DIAGNOSIS — Z79899 Other long term (current) drug therapy: Secondary | ICD-10-CM | POA: Diagnosis not present

## 2015-04-21 DIAGNOSIS — E785 Hyperlipidemia, unspecified: Secondary | ICD-10-CM | POA: Diagnosis present

## 2015-04-21 DIAGNOSIS — M1990 Unspecified osteoarthritis, unspecified site: Secondary | ICD-10-CM | POA: Diagnosis present

## 2015-04-21 DIAGNOSIS — Z880 Allergy status to penicillin: Secondary | ICD-10-CM | POA: Diagnosis not present

## 2015-04-21 DIAGNOSIS — E669 Obesity, unspecified: Secondary | ICD-10-CM | POA: Diagnosis present

## 2015-04-21 DIAGNOSIS — Z882 Allergy status to sulfonamides status: Secondary | ICD-10-CM | POA: Diagnosis not present

## 2015-04-21 DIAGNOSIS — E119 Type 2 diabetes mellitus without complications: Secondary | ICD-10-CM

## 2015-04-21 DIAGNOSIS — I272 Other secondary pulmonary hypertension: Secondary | ICD-10-CM | POA: Diagnosis present

## 2015-04-21 LAB — GLUCOSE, CAPILLARY
GLUCOSE-CAPILLARY: 175 mg/dL — AB (ref 65–99)
GLUCOSE-CAPILLARY: 165 mg/dL — AB (ref 65–99)
Glucose-Capillary: 179 mg/dL — ABNORMAL HIGH (ref 65–99)

## 2015-04-21 LAB — CBC
HCT: 26.6 % — ABNORMAL LOW (ref 36.0–46.0)
HEMOGLOBIN: 8.8 g/dL — AB (ref 12.0–15.0)
MCH: 29.4 pg (ref 26.0–34.0)
MCHC: 33.1 g/dL (ref 30.0–36.0)
MCV: 89 fL (ref 78.0–100.0)
PLATELETS: 279 10*3/uL (ref 150–400)
RBC: 2.99 MIL/uL — AB (ref 3.87–5.11)
RDW: 13.8 % (ref 11.5–15.5)
WBC: 12.6 10*3/uL — ABNORMAL HIGH (ref 4.0–10.5)

## 2015-04-21 LAB — BASIC METABOLIC PANEL
ANION GAP: 9 (ref 5–15)
BUN: 19 mg/dL (ref 6–20)
CALCIUM: 8.3 mg/dL — AB (ref 8.9–10.3)
CO2: 26 mmol/L (ref 22–32)
CREATININE: 1.44 mg/dL — AB (ref 0.44–1.00)
Chloride: 103 mmol/L (ref 101–111)
GFR, EST AFRICAN AMERICAN: 45 mL/min — AB (ref >60)
GFR, EST NON AFRICAN AMERICAN: 39 mL/min — AB (ref >60)
Glucose, Bld: 145 mg/dL — ABNORMAL HIGH (ref 65–99)
Potassium: 3.7 mmol/L (ref 3.5–5.1)
SODIUM: 138 mmol/L (ref 135–145)

## 2015-04-21 MED ORDER — FAMOTIDINE 20 MG PO TABS: 20.0000 mg | ORAL_TABLET | Freq: Two times a day (BID) | ORAL | Status: DC

## 2015-04-21 MED ORDER — INSULIN GLARGINE 100 UNIT/ML ~~LOC~~ SOLN
25.0000 [IU] | Freq: Every day | SUBCUTANEOUS | Status: DC
Start: 2015-04-21 — End: 2015-04-21
  Filled 2015-04-21: qty 0.25

## 2015-04-21 MED ORDER — CEFUROXIME AXETIL 500 MG PO TABS: 500.0000 mg | ORAL_TABLET | Freq: Two times a day (BID) | ORAL | Status: AC

## 2015-04-21 NOTE — Progress Notes (Signed)
Discharged to home with family office visits in place teaching done  

## 2015-04-21 NOTE — Discharge Summary (Signed)
Physician Discharge Summary  Shelly [name]:[id number] DOB: [birth year] DOA: 04/20/2015  PCP: Lupita Raider, MD  Admit date: 04/20/2015 Discharge date: 04/21/2015  Time spent: 60 minutes  Recommendations for Outpatient Follow-up:  1. Follow-up with Lupita Raider, MD in 1 week.  Discharge Diagnoses:  Principal Problem:   Allergic reaction Active Problems:   Pyelonephritis   Hypertension   Hyperlipemia   Acid reflux   Anxiety   Obesity   Diabetes mellitus (HCC)   Asthma   Chronic kidney disease, stage III (moderate)   Hypertensive heart and chronic kidney disease with heart failure and stage 1 through stage 4 chronic kidney disease, or chronic kidney disease (HCC)   Secondary pulmonary hypertension (HCC)   Type 2 diabetes mellitus with diabetic nephropathy (HCC)   Chronic systolic (congestive) heart failure (HCC)   Type 2 diabetes, HbA1c goal < 7% (HCC)   Discharge Condition: Stable and improved  Diet recommendation: Carb modified  Filed Weights   04/20/15 1746  Weight: 103.8 kg (228 lb 13.4 oz)    History of present illness:  Per Dr Shelly [name] is a 59 y.o. female has a past medical history significant for hypertension, hyperlipidemia, chronic systolic heart failure, diabetes mellitus, obesity, presents to the emergency room with chief complaints of "out of body experience" that started this morning and lasted for a few minutes. Patient doesn't have full recollection of events.she also endorses having an allergic reaction with her rash on her abdomen, accompanied by swelling her arms and feet. She hasn't taken any of her home medications today and hasn't eaten anything this morning except for coffee. She was diagnosed with a UTI 2 days ago (she had dysuria about a week ago, saw her PCP and had a urinalysis done, and she was called that the urine culture was positive and was placed on Macrobid). She took a dose of Macrobid on Thursday evening and 2 doses on  Friday. She denies any chest pain, denies any shortness of breath, denies any palpitation, she has chronic abdominal pain, endorses nausea, also endorses fever and chills over the last week. She denies any lightheadedness or dizziness. She denies any weight gain or fluid buildup in her legs. On EMS arrival at her home, she has received benadryl, famotidine with her rash resolving however hand and feet swelling are still persistent. In the ED, patient was febrile to 101.4, not tachycardic nor hypotensive, she has a leukocytosis to 16K, mild Cr elevation and a troponin of 0.05. UA was fairly normal. TR[facility] asked for admission for pyelonephritis given back pain / fever and allergic reaction  Hospital Course:  #1 allergic reaction Patient had presented with symptoms consistent with an allergic reaction with unclear trigger. On a new medication patient was started on was Macrobid and she had only taken 3 doses with the last dose 12 hours prior to her reaction. Patient had no reported bug bites or change in diet. She was placed on IV Pepcid scheduled, Benadryl as needed. Patient's rash improved patient's swelling improved and patient was back to baseline by day of discharge.  Patient's daughter was insistent that she felt her mother may have had a TIA and as she was not in agreement with our evaluation she called me at "M--F" and felt her medical decision was better than ours. MRI was subsequently obtained which was negative for any acute abnormalities. Patient will be discharged home in stable and improved condition on scheduled Pepcid taper. Outpatient follow-up with PCP.  #2 pyelonephritis Patient was  recently diagnosed with a UTI with complaints of left flank pain. Patient had been started on Macrobid prior to her allergic reaction. Admitting physician was unable to obtain records from PCPs office and spoke to [facility] M.D. on call who looked up the cultures which showed Escherichia coli which was sensitive to  Augmentin, cefepime, ceftriaxone, cefuroxime, Bactrim, tetracycline, Macrobid. Patient was placed empirically on IV Rocephin with clinical improvement and improvement in leukocytosis. Patient will be discharged home on 12 more days of oral Ceftin to complete a two-week course of antibiotic therapy. Outpatient follow-up.  #3 hypertension Patient was placed back on a home regimen.  #4 chronic systolic heart failure Patient's last 2-D echo headache if of 50-55% in Shelly 2016 with improvement from prior 2-D echo with EF of 25-30%. Patient was compensated. Patient had a mild troponin leak which was felt to be secondary to have febrile illness. Patient remained asymptomatic. EKG had no ischemic changes. Outpatient follow-up.  #5 chronic kidney disease stage III Remained stable throughout the hospitalization.  #6 type 2 diabetes mellitus Patient was placed back on a lower dose of her home regimen of Lantus. Patient was also maintained on a sliding scale insulin.  The rest of patient's chronic medical issues remained stable throughout the hospitalization and patient be discharged in stable and improved condition.   Procedures:  MRI brain 04/21/2015  Consultations:  None  Discharge Exam: Filed Vitals:   04/21/15 1408  BP: 144/64  Pulse: 72  Temp: 98.2 F (36.8 C)  Resp: 18    General: NAD Cardiovascular: RRR Respiratory: CTAB  Discharge Instructions   Discharge Instructions    Diet Carb Modified    Complete by:  As directed      Discharge instructions    Complete by:  As directed   Follow up with SHAW,KIMBERLEE, MD in 1 week.     Increase activity slowly    Complete by:  As directed           Current Discharge Medication List    START taking these medications   Details  cefUROXime (CEFTIN) 500 MG tablet Take 1 tablet (500 mg total) by mouth 2 (two) times daily with a meal. Take for 12 days, then stop. Qty: 24 tablet, Refills: 0    diphenhydrAMINE (BENADRYL) 50 MG  tablet Take 1 tablet (50 mg total) by mouth at bedtime as needed for itching. Qty: 30 tablet, Refills: 0    famotidine (PEPCID) 20 MG tablet Take 1 tablet (20 mg total) by mouth 2 (two) times daily. Take 1 tablet 2 times daily x 2 days, then 1 tablet daily x 2 days then stop.      CONTINUE these medications which have NOT CHANGED   Details  ADVAIR DISKUS 250-50 MCG/DOSE AEPB Inhale 1 puff into the lungs 2 (two) times daily.    albuterol (PROVENTIL HFA;VENTOLIN HFA) 108 (90 BASE) MCG/ACT inhaler Inhale 1 puff into the lungs every 6 (six) hours as needed for wheezing or shortness of breath.    ALPRAZolam (XANAX) 0.5 MG tablet Take 0.5 mg by mouth 3 (three) times daily as needed. Anxiety    carvedilol (COREG) 6.25 MG tablet Take 1 tablet by mouth 2 (two) times daily.    cetirizine (ZYRTEC) 10 MG tablet Take 5 mg by mouth daily.     fluticasone (FLONASE) 50 MCG/ACT nasal spray Place 1 spray into both nostrils daily as needed. For allergy nasal congestion    furosemide (LASIX) 40 MG tablet Take 1 tablet (40  mg total) by mouth 2 (two) times daily. Qty: 60 tablet, Refills: 11    Insulin Glargine (TOUJEO SOLOSTAR [place]) Inject 80 Units into the skin daily.    losartan (COZAAR) 100 MG tablet Take 1.5 tablets (150 mg total) by mouth every morning. Qty: 45 tablet, Refills: 9    omeprazole (PRILOSEC OTC) 20 MG tablet Take 20 mg by mouth daily.    rosuvastatin (CRESTOR) 10 MG tablet Take 10 mg by mouth every evening.    spironolactone (ALDACTONE) 25 MG tablet Take 1 tablet (25 mg total) by mouth daily. Qty: 30 tablet, Refills: 11      STOP taking these medications     nitrofurantoin, macrocrystal-monohydrate, (MACROBID) 100 MG capsule        Allergies  Allergen Reactions  . Amlodipine Besylate     Other reaction(s): fatgiue and nausea  . Atorvastatin     Other reaction(s): strange feelings  . Lisinopril Cough  . Metformin Diarrhea  . Penicillins Other (See Comments)    Other  reaction(s): rash Unknown reaction.   . Sulfa Antibiotics Nausea And Vomiting  . Welchol  [Colesevelam Hcl]     Other reaction(s): constipation  . Dilaudid [Hydromorphone Hcl] Anxiety and Other (See Comments)    paranoia  . Sulfamethoxazole-Trimethoprim Rash   Follow-up Information    Follow up with SHAW,KIMBERLEE, MD. Schedule an appointment as soon as possible for a visit in 3 days.   Specialty:  Family Medicine   Why:  ED follow up   Contact information:   [address] [place] [address] [phone]        The results of significant diagnostics from this hospitalization (including imaging, microbiology, ancillary and laboratory) are listed below for reference.    Significant Diagnostic Studies: Mr Brain Wo Contrast  04/21/2015   CLINICAL DATA:  Encephalopathy. Leukocytosis. Rash. Fever. Allergic reaction. The patient describes an out of body experience this morning.  EXAM: MRI HEAD WITHOUT CONTRAST  TECHNIQUE: Multiplanar, multiecho pulse sequences of the brain and surrounding structures were obtained without intravenous contrast.  COMPARISON:  CT head without contrast from the same day.  FINDINGS: No acute infarct, hemorrhage, or mass lesion is present. The ventricles are of normal size. No significant extraaxial fluid collection is present.  No significant white matter disease is present. Flow is present in the major intracranial arteries. Asymmetry of the ventricles is a normal variant.  The internal auditory canals and brainstem are within normal limits. The globes and orbits are intact. The paranasal sinuses and mastoid air cells are clear.  IMPRESSION: Negative MRI the brain.   Electronically Signed   By: Marin Roberts M.D.   On: 04/21/2015 18:19    Microbiology: Recent Results (from the past 240 hour(s))  Culture, blood (single)     Status: None (Preliminary result)   Collection Time: 04/20/15  5:17 PM  Result Value Ref Range Status   Specimen  Description BLOOD LEFT ARM  Final   Special Requests BOTTLES DRAWN AEROBIC AND ANAEROBIC 10CC  Final   Culture NO GROWTH < 24 HOURS  Final   Report Status PENDING  Incomplete  Culture, blood (single)     Status: None (Preliminary result)   Collection Time: 04/20/15  5:25 PM  Result Value Ref Range Status   Specimen Description BLOOD LEFT HAND  Final   Special Requests BOTTLES DRAWN AEROBIC ONLY 10CC  Final   Culture NO GROWTH < 24 HOURS  Final   Report Status PENDING  Incomplete     Labs: Basic Metabolic Panel:  Recent Labs Lab 04/20/15 1345 04/21/15 0352  NA 141 138  K 4.2 3.7  CL 106 103  CO2  --  26  GLUCOSE 154* 145*  BUN 20 19  CREATININE 1.20* 1.44*  CALCIUM  --  8.3*   Liver Function Tests: No results for input(s): AST, ALT, ALKPHOS, BILITOT, PROT, ALBUMIN in the last 168 hours. No results for input(s): LIPASE, AMYLASE in the last 168 hours. No results for input(s): AMMONIA in the last 168 hours. CBC:  Recent Labs Lab 04/20/15 1345 04/20/15 1353 04/21/15 0352  WBC  --  16.1* 12.6*  NEUTROABS  --  13.5*  --   HGB 11.2* 10.9* 8.8*  HCT 33.0* 33.2* 26.6*  MCV  --  87.1 89.0  PLT  --  387 279   Cardiac Enzymes:  Recent Labs Lab 04/20/15 1353 04/20/15 2011  TROPONINI 0.05* 0.05*   BNP: BNP (last 3 results)  Recent Labs  04/20/15 1353  BNP 65.3    ProBNP (last 3 results)  Recent Labs  06/28/14 0346  PROBNP 2230.0*    CBG:  Recent Labs Lab 04/20/15 2059 04/21/15 0626 04/21/15 1159 04/21/15 1657  GLUCAP 170* 175* 165* 179*       Signed:  THOMPSON,DANIEL MD [facility] 04/21/2015, 7:07 PM

## 2015-04-21 NOTE — Progress Notes (Signed)
Patient's CBG tonight 170, per pt she did not want to take the whole 50 units of lantus because she has had episodes with hypoglycemia through out the night. MD notified, verbal order to give 25 units tonight. RN will continue to monitor.   Nikki Dom, RN

## 2015-04-22 LAB — HEMOGLOBIN A1C
HEMOGLOBIN A1C: 8.1 % — AB (ref 4.8–5.6)
Mean Plasma Glucose: 186 mg/dL

## 2015-04-24 LAB — URINE CULTURE: Culture: >=100000

## 2015-04-24 NOTE — Care Management (Signed)
CM Contacted and informed that claim has been denied per [facility] for inpt status.  Pt has already discharged home, case was referred to secondary review while pt was still admitted; per physican advisor pt was appropriate for inpt status.  CM contacted Physician Advisor informing of denial.

## 2015-04-25 LAB — CULTURE, BLOOD (SINGLE)
CULTURE: NO GROWTH
Culture: NO GROWTH

## 2016-01-07 ENCOUNTER — Other Ambulatory Visit: Payer: Self-pay | Admitting: Physician Assistant

## 2016-01-07 NOTE — Telephone Encounter (Signed)
Rx request sent to pharmacy.  

## 2016-03-09 ENCOUNTER — Other Ambulatory Visit: Payer: Self-pay | Admitting: Cardiology

## 2016-03-09 NOTE — Telephone Encounter (Signed)
Rx request sent to pharmacy.  

## 2016-03-20 ENCOUNTER — Other Ambulatory Visit: Payer: Self-pay

## 2016-03-20 MED ORDER — FUROSEMIDE 40 MG PO TABS: 40.0000 mg | ORAL_TABLET | Freq: Two times a day (BID) | ORAL | 0 refills | Status: DC

## 2016-04-09 ENCOUNTER — Other Ambulatory Visit: Payer: Self-pay | Admitting: Cardiology

## 2016-04-09 NOTE — Telephone Encounter (Signed)
Rx request sent to pharmacy.  

## 2016-04-28 ENCOUNTER — Other Ambulatory Visit: Payer: Self-pay | Admitting: Family Medicine

## 2016-04-28 DIAGNOSIS — Z1231 Encounter for screening mammogram for malignant neoplasm of breast: Secondary | ICD-10-CM

## 2016-05-06 ENCOUNTER — Other Ambulatory Visit: Payer: Self-pay | Admitting: Cardiology

## 2016-05-12 ENCOUNTER — Ambulatory Visit: Payer: 59

## 2016-06-09 ENCOUNTER — Other Ambulatory Visit: Payer: Self-pay | Admitting: Cardiology

## 2016-06-29 ENCOUNTER — Other Ambulatory Visit: Payer: Self-pay | Admitting: Cardiology

## 2016-06-29 NOTE — Telephone Encounter (Signed)
refill 

## 2016-06-30 ENCOUNTER — Other Ambulatory Visit: Payer: Self-pay | Admitting: Family Medicine

## 2016-06-30 DIAGNOSIS — R1030 Lower abdominal pain, unspecified: Secondary | ICD-10-CM

## 2016-07-03 ENCOUNTER — Ambulatory Visit
Admission: RE | Admit: 2016-07-03 | Discharge: 2016-07-03 | Disposition: A | Discharge status: Home / Self Care | Payer: 59 | Source: Ambulatory Visit | Attending: Family Medicine | Admitting: Family Medicine

## 2016-07-03 DIAGNOSIS — R1030 Lower abdominal pain, unspecified: Secondary | ICD-10-CM

## 2016-07-03 MED ORDER — IOPAMIDOL (ISOVUE-300) INJECTION 61%
100.0000 mL | Freq: Once | INTRAVENOUS | Status: AC | PRN
  Administered 2016-07-03: 100 mL via INTRAVENOUS

## 2016-07-07 ENCOUNTER — Other Ambulatory Visit: Payer: 59

## 2016-07-09 ENCOUNTER — Other Ambulatory Visit: Payer: Self-pay | Admitting: Family Medicine

## 2016-07-09 ENCOUNTER — Ambulatory Visit
Admission: RE | Admit: 2016-07-09 | Discharge: 2016-07-09 | Disposition: A | Discharge status: Home / Self Care | Payer: 59 | Source: Ambulatory Visit | Attending: Family Medicine | Admitting: Family Medicine

## 2016-07-09 DIAGNOSIS — R059 Cough, unspecified: Secondary | ICD-10-CM

## 2016-07-09 DIAGNOSIS — R05 Cough: Secondary | ICD-10-CM

## 2016-08-04 ENCOUNTER — Ambulatory Visit: Payer: 59

## 2016-08-04 DIAGNOSIS — N3020 Other chronic cystitis without hematuria: Secondary | ICD-10-CM | POA: Diagnosis not present

## 2016-08-04 DIAGNOSIS — N183 Chronic kidney disease, stage 3 (moderate): Secondary | ICD-10-CM | POA: Diagnosis not present

## 2016-08-04 DIAGNOSIS — N1330 Unspecified hydronephrosis: Secondary | ICD-10-CM | POA: Diagnosis not present

## 2016-08-10 ENCOUNTER — Other Ambulatory Visit: Payer: Self-pay | Admitting: Urology

## 2016-08-10 DIAGNOSIS — N1330 Unspecified hydronephrosis: Secondary | ICD-10-CM

## 2016-08-14 ENCOUNTER — Ambulatory Visit
Admission: RE | Admit: 2016-08-14 | Discharge: 2016-08-14 | Disposition: A | Discharge status: Home / Self Care | Payer: 59 | Source: Ambulatory Visit | Attending: Family Medicine | Admitting: Family Medicine

## 2016-08-14 DIAGNOSIS — Z1231 Encounter for screening mammogram for malignant neoplasm of breast: Secondary | ICD-10-CM | POA: Diagnosis not present

## 2016-08-20 ENCOUNTER — Encounter (HOSPITAL_COMMUNITY)
Admission: RE | Admit: 2016-08-20 | Discharge: 2016-08-20 | Disposition: A | Discharge status: Home / Self Care | Payer: 59 | Source: Ambulatory Visit | Attending: Urology | Admitting: Urology

## 2016-08-20 ENCOUNTER — Encounter (HOSPITAL_COMMUNITY): Payer: Self-pay

## 2016-08-20 DIAGNOSIS — N1330 Unspecified hydronephrosis: Secondary | ICD-10-CM | POA: Insufficient documentation

## 2016-08-20 MED ORDER — FUROSEMIDE 10 MG/ML IJ SOLN
INTRAMUSCULAR | Status: AC
  Filled 2016-08-20: qty 8

## 2016-08-20 MED ORDER — FUROSEMIDE 10 MG/ML IJ SOLN: 52.0000 mg | Freq: Once | INTRAMUSCULAR | Status: DC

## 2016-08-24 ENCOUNTER — Encounter (HOSPITAL_COMMUNITY)
Admission: RE | Admit: 2016-08-24 | Discharge: 2016-08-24 | Disposition: A | Discharge status: Home / Self Care | Payer: 59 | Source: Ambulatory Visit | Attending: Urology | Admitting: Urology

## 2016-08-24 DIAGNOSIS — N1330 Unspecified hydronephrosis: Secondary | ICD-10-CM | POA: Diagnosis not present

## 2016-08-24 MED ORDER — TECHNETIUM TC 99M MERTIATIDE
5.5000 | Freq: Once | INTRAVENOUS | Status: AC | PRN
  Administered 2016-08-24: 5.5 via INTRAVENOUS

## 2016-08-24 MED ORDER — FUROSEMIDE 10 MG/ML IJ SOLN
52.0000 mg | Freq: Once | INTRAMUSCULAR | Status: AC
  Administered 2016-08-24: 52 mg via INTRAVENOUS

## 2016-08-24 MED ORDER — FUROSEMIDE 10 MG/ML IJ SOLN
INTRAMUSCULAR | Status: AC
  Filled 2016-08-24: qty 8

## 2016-09-03 DIAGNOSIS — N3020 Other chronic cystitis without hematuria: Secondary | ICD-10-CM | POA: Diagnosis not present

## 2016-09-17 DIAGNOSIS — N3020 Other chronic cystitis without hematuria: Secondary | ICD-10-CM | POA: Diagnosis not present

## 2016-09-17 DIAGNOSIS — N1330 Unspecified hydronephrosis: Secondary | ICD-10-CM | POA: Diagnosis not present

## 2016-09-20 ENCOUNTER — Emergency Department (HOSPITAL_COMMUNITY)
Admission: EM | Admit: 2016-09-20 | Discharge: 2016-09-20 | Disposition: A | Discharge status: Home / Self Care | Payer: 59 | Attending: Emergency Medicine | Admitting: Emergency Medicine

## 2016-09-20 ENCOUNTER — Encounter (HOSPITAL_COMMUNITY): Payer: Self-pay | Admitting: Emergency Medicine

## 2016-09-20 ENCOUNTER — Emergency Department (HOSPITAL_COMMUNITY): Payer: 59

## 2016-09-20 DIAGNOSIS — Z79899 Other long term (current) drug therapy: Secondary | ICD-10-CM | POA: Insufficient documentation

## 2016-09-20 DIAGNOSIS — Z794 Long term (current) use of insulin: Secondary | ICD-10-CM | POA: Insufficient documentation

## 2016-09-20 DIAGNOSIS — N183 Chronic kidney disease, stage 3 (moderate): Secondary | ICD-10-CM | POA: Insufficient documentation

## 2016-09-20 DIAGNOSIS — J45909 Unspecified asthma, uncomplicated: Secondary | ICD-10-CM | POA: Insufficient documentation

## 2016-09-20 DIAGNOSIS — E1122 Type 2 diabetes mellitus with diabetic chronic kidney disease: Secondary | ICD-10-CM | POA: Insufficient documentation

## 2016-09-20 DIAGNOSIS — R1084 Generalized abdominal pain: Secondary | ICD-10-CM

## 2016-09-20 DIAGNOSIS — I130 Hypertensive heart and chronic kidney disease with heart failure and stage 1 through stage 4 chronic kidney disease, or unspecified chronic kidney disease: Secondary | ICD-10-CM | POA: Diagnosis not present

## 2016-09-20 DIAGNOSIS — R109 Unspecified abdominal pain: Secondary | ICD-10-CM | POA: Diagnosis not present

## 2016-09-20 DIAGNOSIS — Z96653 Presence of artificial knee joint, bilateral: Secondary | ICD-10-CM | POA: Insufficient documentation

## 2016-09-20 DIAGNOSIS — I5022 Chronic systolic (congestive) heart failure: Secondary | ICD-10-CM | POA: Insufficient documentation

## 2016-09-20 LAB — URINALYSIS, ROUTINE W REFLEX MICROSCOPIC
BILIRUBIN URINE: NEGATIVE
GLUCOSE, UA: NEGATIVE mg/dL
Ketones, ur: NEGATIVE mg/dL
LEUKOCYTES UA: NEGATIVE
Nitrite: NEGATIVE
PH: 6 (ref 5.0–8.0)
Protein, ur: 100 mg/dL — AB
Specific Gravity, Urine: 1.012 (ref 1.005–1.030)

## 2016-09-20 LAB — CBC WITH DIFFERENTIAL/PLATELET
Basophils Absolute: 0.1 10*3/uL (ref 0.0–0.1)
Basophils Relative: 0 %
EOS PCT: 1 %
Eosinophils Absolute: 0.2 10*3/uL (ref 0.0–0.7)
HCT: 27.7 % — ABNORMAL LOW (ref 36.0–46.0)
Hemoglobin: 9.1 g/dL — ABNORMAL LOW (ref 12.0–15.0)
LYMPHS ABS: 2 10*3/uL (ref 0.7–4.0)
LYMPHS PCT: 11 %
MCH: 28.2 pg (ref 26.0–34.0)
MCHC: 32.9 g/dL (ref 30.0–36.0)
MCV: 85.8 fL (ref 78.0–100.0)
MONO ABS: 1.4 10*3/uL — AB (ref 0.1–1.0)
MONOS PCT: 7 %
Neutro Abs: 15.3 10*3/uL — ABNORMAL HIGH (ref 1.7–7.7)
Neutrophils Relative %: 81 %
PLATELETS: 382 10*3/uL (ref 150–400)
RBC: 3.23 MIL/uL — ABNORMAL LOW (ref 3.87–5.11)
RDW: 14.7 % (ref 11.5–15.5)
WBC: 19 10*3/uL — ABNORMAL HIGH (ref 4.0–10.5)

## 2016-09-20 LAB — COMPREHENSIVE METABOLIC PANEL
ALT: 11 U/L — ABNORMAL LOW (ref 14–54)
AST: 16 U/L (ref 15–41)
Albumin: 3.2 g/dL — ABNORMAL LOW (ref 3.5–5.0)
Alkaline Phosphatase: 66 U/L (ref 38–126)
Anion gap: 6 (ref 5–15)
BUN: 18 mg/dL (ref 6–20)
CHLORIDE: 103 mmol/L (ref 101–111)
CO2: 26 mmol/L (ref 22–32)
CREATININE: 1.4 mg/dL — AB (ref 0.44–1.00)
Calcium: 8.6 mg/dL — ABNORMAL LOW (ref 8.9–10.3)
GFR calc Af Amer: 46 mL/min — ABNORMAL LOW (ref >60)
GFR, EST NON AFRICAN AMERICAN: 40 mL/min — AB (ref >60)
Glucose, Bld: 106 mg/dL — ABNORMAL HIGH (ref 65–99)
Potassium: 3.8 mmol/L (ref 3.5–5.1)
Sodium: 135 mmol/L (ref 135–145)
TOTAL PROTEIN: 7.8 g/dL (ref 6.5–8.1)
Total Bilirubin: 0.9 mg/dL (ref 0.3–1.2)

## 2016-09-20 LAB — LACTIC ACID, PLASMA
LACTIC ACID, VENOUS: 1.4 mmol/L (ref 0.5–1.9)
Lactic Acid, Venous: 0.7 mmol/L (ref 0.5–1.9)

## 2016-09-20 MED ORDER — IOPAMIDOL (ISOVUE-300) INJECTION 61%
INTRAVENOUS | Status: AC
  Administered 2016-09-20: 80 mL
  Filled 2016-09-20: qty 100

## 2016-09-20 MED ORDER — IBUPROFEN 800 MG PO TABS
800.0000 mg | ORAL_TABLET | Freq: Once | ORAL | Status: AC
  Administered 2016-09-20: 800 mg via ORAL
  Filled 2016-09-20: qty 1

## 2016-09-20 MED ORDER — ONDANSETRON HCL 4 MG/2ML IJ SOLN
4.0000 mg | Freq: Once | INTRAMUSCULAR | Status: AC
  Administered 2016-09-20: 4 mg via INTRAVENOUS
  Filled 2016-09-20: qty 2

## 2016-09-20 MED ORDER — PROMETHAZINE HCL 25 MG PO TABS: 25.0000 mg | ORAL_TABLET | Freq: Every day | ORAL | 0 refills | Status: AC | PRN

## 2016-09-20 MED ORDER — HYDROCODONE-ACETAMINOPHEN 5-325 MG PO TABS
1.0000 | ORAL_TABLET | Freq: Once | ORAL | Status: AC
  Administered 2016-09-20: 1 via ORAL
  Filled 2016-09-20: qty 1

## 2016-09-20 MED ORDER — PROMETHAZINE HCL 25 MG PO TABS
12.5000 mg | ORAL_TABLET | Freq: Once | ORAL | Status: AC
  Administered 2016-09-20: 12.5 mg via ORAL
  Filled 2016-09-20: qty 1

## 2016-09-20 MED ORDER — MORPHINE SULFATE (PF) 4 MG/ML IV SOLN
6.0000 mg | Freq: Once | INTRAVENOUS | Status: AC
  Administered 2016-09-20: 6 mg via INTRAVENOUS
  Filled 2016-09-20: qty 2

## 2016-09-20 MED ORDER — ACETAMINOPHEN 325 MG PO TABS
650.0000 mg | ORAL_TABLET | Freq: Once | ORAL | Status: AC
  Administered 2016-09-20: 650 mg via ORAL
  Filled 2016-09-20: qty 2

## 2016-09-20 MED ORDER — SODIUM CHLORIDE 0.9 % IV BOLUS (SEPSIS)
1000.0000 mL | Freq: Once | INTRAVENOUS | Status: AC
Start: 2016-09-20 — End: 2016-09-20
  Administered 2016-09-20: 1000 mL via INTRAVENOUS

## 2016-09-20 NOTE — ED Notes (Signed)
No respiratory or acute distress noted alert and oriented x 3 call light in reach visitors at bedside no reaction to medication noted.

## 2016-09-20 NOTE — ED Provider Notes (Signed)
[facility]-EMERGENCY DEPT Provider Note   CSN: [id number] Arrival date & time: 09/20/16  1707     History   Chief Complaint Chief Complaint  Patient presents with  . Abdominal Pain  . Back Pain    HPI Shelly [name] is a 61 y.o. female.  HPI  61 year old female with past revision of anxiety, arthritis, cardiomyopathy, recurrent UTIs presents emergency Department with acute worsening of abdominal distention, pain and back pain. No urinary symptoms. She has nausea and no vomiting. She has a past history of multiple abdominal surgeries as well. She states this is been worsening over the last 24 hours. She has associated temperature MAXIMUM TEMPERATURE of 102 at home. Has not tried anything for her symptoms. No sick contacts. She says she felt this way last time she had pyelonephritis.  Past Medical History:  Diagnosis Date  . Acid reflux   . Anxiety   . Arthritis   . Asthma    infrequent problems  . Cardiomyopathy (HCC)   . Complication of anesthesia    "I quit breathing during surgery" 2008 - has had surgery since with no problems   . Diabetes mellitus   . Endometriosis    hx of   . Headache    hx of migraines  . Hepatitis    hx of dx during ruptured appendix hospitalization  . Hyperlipemia   . Hypertension   . Irritable bowel syndrome   . Obesity   . Pneumonia 12/2013  . PONV (postoperative nausea and vomiting)   . Rash    sides of neck  . Stress incontinence   . Tuberculosis 1985    Patient Active Problem List   Diagnosis Date Noted  . Allergic reaction 04/21/2015  . Type 2 diabetes, HbA1c goal < 7% (HCC)   . Pyelonephritis 04/20/2015  . Chronic systolic (congestive) heart failure 04/20/2015  . Anemia 04/18/2015  . Atherosclerosis of aorta (HCC) 04/18/2015  . Heart failure (HCC) 04/18/2015  . Chronic kidney disease, stage III (moderate) 04/18/2015  . Gastro-esophageal reflux disease without esophagitis 04/18/2015  . Hypertensive heart and chronic kidney  disease with heart failure and stage 1 through stage 4 chronic kidney disease, or chronic kidney disease (HCC) 04/18/2015  . Mixed hyperlipidemia 04/18/2015  . Mood disorder (HCC) 04/18/2015  . Secondary pulmonary hypertension 04/18/2015  . History of colonic polyps 04/18/2015  . Type 2 diabetes mellitus with diabetic nephropathy (HCC) 04/18/2015  . 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 Dyspnea   . SOB (shortness of breath)   . Acute CHF (HCC) 06/28/2014  . Acute systolic congestive heart failure (HCC)   . OA (osteoarthritis) of knee 10/27/2013  . Dehydration 09/12/2011  . ARF (acute renal failure) (HCC) 09/12/2011  . Diarrhea 09/12/2011  . Nausea and vomiting 09/12/2011  . Diabetes mellitus (HCC) 09/12/2011  . Asthma 09/12/2011  . Hypertension   . Hyperlipemia   . Acid reflux   . Anxiety   . Obesity     Past Surgical History:  Procedure Laterality Date  . ABDOMINAL ADHESION SURGERY     x 7  . ABDOMINAL HYSTERECTOMY    . APPENDECTOMY    . CHOLECYSTECTOMY    . COLON SURGERY  2010   colon resection due to adhesions  . LEFT AND RIGHT HEART CATHETERIZATION WITH CORONARY ANGIOGRAM N/A 06/29/2014   Procedure: LEFT AND RIGHT HEART CATHETERIZATION WITH CORONARY ANGIOGRAM;  Surgeon: Corky Crafts, MD;  Location: [facility] CATH LAB;  Service: Cardiovascular;  Laterality: N/A;  . PARTIAL KNEE ARTHROPLASTY  Left 10/27/2013   Procedure: LEFT KNEE UNICOMPARTMENTAL REPLACEMENT ;  Surgeon: Loanne Drilling, MD;  Location: [facility] ORS;  Service: Orthopedics;  Laterality: Left;  . PARTIAL KNEE ARTHROPLASTY Right 05/21/2014   Procedure: MEDIAL UNICOMPARTMENTAL RIGHT KNEE ARTHROPLASTY;  Surgeon: Loanne Drilling, MD;  Location: [facility] ORS;  Service: Orthopedics;  Laterality: Right;  . ruptured appendix surgery    . TONSILLECTOMY      OB History    No data available       Home Medications    Prior to Admission medications   Medication Sig Start Date End Date Taking? Authorizing Provider  ADVAIR DISKUS  250-50 MCG/DOSE AEPB Inhale 1 puff into the lungs 2 (two) times daily. 05/31/14  Yes Historical Provider, MD  albuterol (PROVENTIL HFA;VENTOLIN HFA) 108 (90 BASE) MCG/ACT inhaler Inhale 1 puff into the lungs every 6 (six) hours as needed for wheezing or shortness of breath.   Yes Historical Provider, MD  albuterol (PROVENTIL) (2.5 MG/3ML) 0.083% nebulizer solution Inhale 2.5 mg into the lungs daily. 07/09/16  Yes Historical Provider, MD  ALPRAZolam Prudy Feeler) 0.5 MG tablet Take 0.5 mg by mouth 3 (three) times daily as needed. Anxiety   Yes Historical Provider, MD  carvedilol (COREG) 25 MG tablet Take 1 tablet by mouth 2 (two) times daily. 02/20/15  Yes Historical Provider, MD  cetirizine (ZYRTEC) 10 MG tablet Take 5 mg by mouth daily.    Yes Historical Provider, MD  cloNIDine (CATAPRES) 0.1 MG tablet Take 0.1 mg by mouth daily. 07/31/16  Yes Historical Provider, MD  COMBIVENT RESPIMAT 20-100 MCG/ACT AERS respimat Take 1 puff by mouth daily. 07/23/16  Yes Historical Provider, MD  ESTRACE VAGINAL 0.1 MG/GM vaginal cream Place 1 application vaginally daily. 08/04/16  Yes Historical Provider, MD  fluticasone (FLONASE) 50 MCG/ACT nasal spray Place 1 spray into both nostrils daily as needed. For allergy nasal congestion 05/31/14  Yes Historical Provider, MD  furosemide (LASIX) 40 MG tablet TAKE ONE TABLET BY MOUTH TWICE DAILY Patient taking differently: take 1 tablet by mouth once daily 06/10/16  Yes Rollene Rotunda, MD  gabapentin (NEURONTIN) 100 MG capsule Take 100 mg by mouth daily. 07/20/16  Yes Historical Provider, MD  Insulin Glargine (TOUJEO SOLOSTAR [place]) Inject 50 Units into the skin daily.    Yes Historical Provider, MD  losartan (COZAAR) 100 MG tablet TAKE ONE AND ONE-HALF TABLETS BY MOUTH ONCE DAILY IN THE MORNING **NEED  APPOINTMENT  FOR  REFILLS** 06/29/16  Yes Rollene Rotunda, MD  omeprazole (PRILOSEC OTC) 20 MG tablet Take 20 mg by mouth daily.   Yes Historical Provider, MD  promethazine (PHENERGAN) 25  MG tablet Take 1 tablet (25 mg total) by mouth daily as needed. 09/20/16   Marily Memos, MD    Family History Family History  Problem Relation Age of Onset  . Heart failure Mother   . Heart disease Father   . Diabetes Father   . Liver cancer Brother     Social History Social History  Substance Use Topics  . Smoking status: Never Smoker  . Smokeless tobacco: Never Used  . Alcohol use Yes     Comment: occ     Allergies   Amlodipine besylate; Atorvastatin; Lisinopril; Metformin; Penicillins; Sulfa antibiotics; Welchol  [colesevelam hcl]; Dilaudid [hydromorphone hcl]; and Sulfamethoxazole-trimethoprim   Review of Systems Review of Systems  All other systems reviewed and are negative.    Physical Exam Updated Vital Signs BP 117/56 (BP Location: Right Arm)   Pulse 88  Temp 99.6 F (37.6 C) (Oral)   Resp 16   Ht 5\' 2"  (1.575 m)   Wt 228 lb (103.4 kg)   SpO2 90%   BMI 41.70 kg/m   Physical Exam  Constitutional: She is oriented to person, place, and time. She appears well-developed and well-nourished.  HENT:  Head: Normocephalic and atraumatic.  Eyes: Conjunctivae and EOM are normal.  Neck: Normal range of motion.  Cardiovascular: Regular rhythm.  Tachycardia present.   Pulmonary/Chest: No stridor. No respiratory distress.  Abdominal: Soft. She exhibits no distension.  Musculoskeletal: She exhibits no edema or deformity.  Neurological: She is alert and oriented to person, place, and time. No cranial nerve deficit. Coordination normal.  Skin: Skin is warm and dry.  Nursing note and vitals reviewed.    ED Treatments / Results  Labs (all labs ordered are listed, but only abnormal results are displayed) Labs Reviewed  CBC WITH DIFFERENTIAL/PLATELET - Abnormal; Notable for the following:       Result Value   WBC 19.0 (*)    RBC 3.23 (*)    Hemoglobin 9.1 (*)    HCT 27.7 (*)    Neutro Abs 15.3 (*)    Monocytes Absolute 1.4 (*)    All other components within  normal limits  COMPREHENSIVE METABOLIC PANEL - Abnormal; Notable for the following:    Glucose, Bld 106 (*)    Creatinine, Ser 1.40 (*)    Calcium 8.6 (*)    Albumin 3.2 (*)    ALT 11 (*)    GFR calc non Af Amer 40 (*)    GFR calc Af Amer 46 (*)    All other components within normal limits  URINALYSIS, ROUTINE W REFLEX MICROSCOPIC - Abnormal; Notable for the following:    Hgb urine dipstick MODERATE (*)    Protein, ur 100 (*)    Bacteria, UA RARE (*)    Squamous Epithelial / LPF 0-5 (*)    All other components within normal limits  URINE CULTURE  LACTIC ACID, PLASMA  LACTIC ACID, PLASMA    EKG  EKG Interpretation None       Radiology Ct Abdomen Pelvis W Contrast  Result Date: 09/20/2016 CLINICAL DATA:  Abdominal pain/distension, evaluate for free air or obstruction EXAM: CT ABDOMEN AND PELVIS WITH CONTRAST TECHNIQUE: Multidetector CT imaging of the abdomen and pelvis was performed using the standard protocol following bolus administration of intravenous contrast. CONTRAST:  65mL ISOVUE-300 IOPAMIDOL (ISOVUE-300) INJECTION 61% COMPARISON:  07/03/2016 FINDINGS: Lower chest: Mild patchy bibasilar opacities, likely atelectasis. Trace bilateral pleural effusions. Hepatobiliary: Liver is within normal limits. Status post cholecystectomy. No intrahepatic ductal dilatation. Common duct is mildly prominent, measuring 14 mm centrally, but smoothly tapering at the ampulla. Pancreas: Within normal limits. Spleen: Within normal limits. Adrenals/Urinary Tract: Adrenal gland are within normal limits. Kidneys are within normal limits. No renal, ureteral, or bladder calculi. No hydronephrosis. Bladder is underdistended but unremarkable. Stomach/Bowel: Stomach is within normal limits. No evidence of bowel obstruction. Prior appendectomy. Status post left hemicolectomy with suture line in the left lower pelvis (series 2/ image 69). Mild left colonic diverticulosis, without evidence of diverticulitis.  Vascular/Lymphatic: No evidence of abdominal aortic aneurysm. Atherosclerotic calcifications of the abdominal aorta and branch vessels. Small upper abdominal lymph nodes, including an 11 mm short axis portacaval node (series 2/image 25), within normal limits. No suspicious pelvic lymphadenopathy. Reproductive: Status post hysterectomy. No adnexal masses. Other: No abdominopelvic ascites. No free air. Postsurgical changes along the midline anterior abdominal  wall. Musculoskeletal: Very mild degenerative changes the visualized thoracolumbar spine. IMPRESSION: No CT findings to account for the patient's abdominal pain. Prior cholecystectomy, appendectomy, hysterectomy, and partial left hemicolectomy. Trace bilateral pleural effusions. Associated bilateral lower lobe atelectasis. Additional ancillary findings as above. Electronically Signed   By: Charline Bills M.D.   On: 09/20/2016 20:40    Procedures Procedures (including critical care time)  Medications Ordered in ED Medications  sodium chloride 0.9 % bolus 1,000 mL (0 mLs Intravenous Stopped 09/20/16 2053)  ondansetron (ZOFRAN) injection 4 mg (4 mg Intravenous Given 09/20/16 1850)  morphine 4 MG/ML injection 6 mg (6 mg Intravenous Given 09/20/16 1849)  iopamidol (ISOVUE-300) 61 % injection (80 mLs  Contrast Given 09/20/16 1959)  acetaminophen (TYLENOL) tablet 650 mg (650 mg Oral Given 09/20/16 2017)  promethazine (PHENERGAN) tablet 12.5 mg (12.5 mg Oral Given 09/20/16 2238)  HYDROcodone-acetaminophen (NORCO/VICODIN) 5-325 MG per tablet 1 tablet (1 tablet Oral Given 09/20/16 2238)  ibuprofen (ADVIL,MOTRIN) tablet 800 mg (800 mg Oral Given 09/20/16 2238)     Initial Impression / Assessment and Plan / ED Course  I have reviewed the triage vital signs and the nursing notes.  Pertinent labs & imaging results that were available during my care of the patient were reviewed by me and considered in my medical decision making (see chart for details).      Pyelo vs perforated viscus v obstruction. Labs/ct/ua/analgesia/antipyretic/anti-emetics. CT okay. Labs are all appropriate. Unsure of the cause her symptoms could be related to gas versus IBS. Patient will continue to follow with her primary doctor. Pain is well-controlled at time of discharge. Patient is tolerating by mouth fluids as well.  Final Clinical Impressions(s) / ED Diagnoses   Final diagnoses:  Generalized abdominal pain     Marily Memos, MD 09/20/16 2357

## 2016-09-20 NOTE — ED Notes (Signed)
Informed Dr Clayborne Dana that pt oxygen sats were 69% on room air pt awake and talking no respiratory or acute distress noted able to speak in full sentences. Alert and oriented x 3 visitors at bedside call light in reach no reaction to medication noted.

## 2016-09-20 NOTE — ED Triage Notes (Signed)
Pt c/o abdominal pain/distention and mid to lower back pain. Pt has multple c/o with extensive hx. Some symptoms pt states have been going on for over a year. Dry heaving in triage.

## 2016-09-22 LAB — URINE CULTURE

## 2016-09-29 ENCOUNTER — Other Ambulatory Visit: Payer: Self-pay | Admitting: Cardiology

## 2016-09-29 ENCOUNTER — Ambulatory Visit
Admission: RE | Admit: 2016-09-29 | Discharge: 2016-09-29 | Disposition: A | Discharge status: Home / Self Care | Payer: 59 | Source: Ambulatory Visit | Attending: Family Medicine | Admitting: Family Medicine

## 2016-09-29 ENCOUNTER — Other Ambulatory Visit: Payer: Self-pay | Admitting: Family Medicine

## 2016-09-29 DIAGNOSIS — I509 Heart failure, unspecified: Secondary | ICD-10-CM | POA: Diagnosis not present

## 2016-09-29 DIAGNOSIS — R0600 Dyspnea, unspecified: Secondary | ICD-10-CM

## 2016-09-29 DIAGNOSIS — R0602 Shortness of breath: Secondary | ICD-10-CM | POA: Diagnosis not present

## 2016-09-29 DIAGNOSIS — R109 Unspecified abdominal pain: Secondary | ICD-10-CM | POA: Diagnosis not present

## 2016-09-29 DIAGNOSIS — D72829 Elevated white blood cell count, unspecified: Secondary | ICD-10-CM | POA: Diagnosis not present

## 2016-09-29 DIAGNOSIS — N179 Acute kidney failure, unspecified: Secondary | ICD-10-CM | POA: Diagnosis not present

## 2016-09-29 DIAGNOSIS — N390 Urinary tract infection, site not specified: Secondary | ICD-10-CM | POA: Diagnosis not present

## 2016-10-02 ENCOUNTER — Other Ambulatory Visit: Payer: Self-pay | Admitting: Family Medicine

## 2016-10-02 ENCOUNTER — Ambulatory Visit
Admission: RE | Admit: 2016-10-02 | Discharge: 2016-10-02 | Disposition: A | Discharge status: Home / Self Care | Payer: 59 | Source: Ambulatory Visit | Attending: Family Medicine | Admitting: Family Medicine

## 2016-10-02 DIAGNOSIS — R9389 Abnormal findings on diagnostic imaging of other specified body structures: Secondary | ICD-10-CM

## 2016-10-02 DIAGNOSIS — R918 Other nonspecific abnormal finding of lung field: Secondary | ICD-10-CM | POA: Diagnosis not present

## 2016-10-05 ENCOUNTER — Other Ambulatory Visit: Payer: Self-pay | Admitting: Family Medicine

## 2016-10-05 DIAGNOSIS — R0600 Dyspnea, unspecified: Secondary | ICD-10-CM

## 2016-10-05 DIAGNOSIS — I509 Heart failure, unspecified: Secondary | ICD-10-CM

## 2016-10-19 DIAGNOSIS — I509 Heart failure, unspecified: Secondary | ICD-10-CM | POA: Diagnosis not present

## 2016-10-19 DIAGNOSIS — M549 Dorsalgia, unspecified: Secondary | ICD-10-CM | POA: Diagnosis not present

## 2016-10-19 DIAGNOSIS — D649 Anemia, unspecified: Secondary | ICD-10-CM | POA: Diagnosis not present

## 2016-10-19 DIAGNOSIS — N12 Tubulo-interstitial nephritis, not specified as acute or chronic: Secondary | ICD-10-CM | POA: Diagnosis not present

## 2016-10-21 ENCOUNTER — Other Ambulatory Visit (HOSPITAL_COMMUNITY): Payer: 59

## 2016-10-22 DIAGNOSIS — R197 Diarrhea, unspecified: Secondary | ICD-10-CM | POA: Diagnosis not present

## 2016-11-13 ENCOUNTER — Encounter: Payer: Self-pay | Admitting: *Deleted

## 2016-11-29 ENCOUNTER — Other Ambulatory Visit: Payer: Self-pay | Admitting: Cardiology

## 2016-11-30 NOTE — Telephone Encounter (Signed)
Rx request sent to pharmacy.  

## 2016-12-04 DIAGNOSIS — N183 Chronic kidney disease, stage 3 (moderate): Secondary | ICD-10-CM | POA: Diagnosis not present

## 2016-12-16 ENCOUNTER — Other Ambulatory Visit (HOSPITAL_COMMUNITY): Payer: 59

## 2016-12-16 DIAGNOSIS — N183 Chronic kidney disease, stage 3 (moderate): Secondary | ICD-10-CM | POA: Diagnosis not present

## 2016-12-16 DIAGNOSIS — E1121 Type 2 diabetes mellitus with diabetic nephropathy: Secondary | ICD-10-CM | POA: Diagnosis not present

## 2016-12-16 DIAGNOSIS — N179 Acute kidney failure, unspecified: Secondary | ICD-10-CM | POA: Diagnosis not present

## 2016-12-16 DIAGNOSIS — Z794 Long term (current) use of insulin: Secondary | ICD-10-CM | POA: Diagnosis not present

## 2016-12-24 DIAGNOSIS — N183 Chronic kidney disease, stage 3 (moderate): Secondary | ICD-10-CM | POA: Diagnosis not present

## 2017-01-05 DIAGNOSIS — N183 Chronic kidney disease, stage 3 (moderate): Secondary | ICD-10-CM | POA: Diagnosis not present

## 2017-01-12 ENCOUNTER — Ambulatory Visit (HOSPITAL_COMMUNITY): Payer: 59 | Attending: Cardiovascular Disease

## 2017-01-12 ENCOUNTER — Other Ambulatory Visit: Payer: Self-pay

## 2017-01-12 DIAGNOSIS — I509 Heart failure, unspecified: Secondary | ICD-10-CM

## 2017-01-12 DIAGNOSIS — Z6841 Body Mass Index (BMI) 40.0 and over, adult: Secondary | ICD-10-CM | POA: Diagnosis not present

## 2017-01-12 DIAGNOSIS — I420 Dilated cardiomyopathy: Secondary | ICD-10-CM | POA: Diagnosis not present

## 2017-01-12 DIAGNOSIS — E785 Hyperlipidemia, unspecified: Secondary | ICD-10-CM | POA: Diagnosis not present

## 2017-01-12 DIAGNOSIS — R0600 Dyspnea, unspecified: Secondary | ICD-10-CM

## 2017-01-12 DIAGNOSIS — I350 Nonrheumatic aortic (valve) stenosis: Secondary | ICD-10-CM | POA: Diagnosis not present

## 2017-01-12 DIAGNOSIS — E119 Type 2 diabetes mellitus without complications: Secondary | ICD-10-CM | POA: Insufficient documentation

## 2017-01-12 DIAGNOSIS — J45909 Unspecified asthma, uncomplicated: Secondary | ICD-10-CM | POA: Diagnosis not present

## 2017-01-12 DIAGNOSIS — E669 Obesity, unspecified: Secondary | ICD-10-CM | POA: Insufficient documentation

## 2017-01-12 DIAGNOSIS — I110 Hypertensive heart disease with heart failure: Secondary | ICD-10-CM | POA: Insufficient documentation

## 2017-01-12 DIAGNOSIS — Z8249 Family history of ischemic heart disease and other diseases of the circulatory system: Secondary | ICD-10-CM | POA: Diagnosis not present

## 2017-01-12 DIAGNOSIS — I340 Nonrheumatic mitral (valve) insufficiency: Secondary | ICD-10-CM | POA: Insufficient documentation

## 2017-02-08 DIAGNOSIS — J45901 Unspecified asthma with (acute) exacerbation: Secondary | ICD-10-CM | POA: Diagnosis not present

## 2017-02-08 DIAGNOSIS — I509 Heart failure, unspecified: Secondary | ICD-10-CM | POA: Diagnosis not present

## 2017-02-08 DIAGNOSIS — N179 Acute kidney failure, unspecified: Secondary | ICD-10-CM | POA: Diagnosis not present

## 2017-02-15 ENCOUNTER — Ambulatory Visit: Payer: 59 | Admitting: Physical Therapy

## 2017-02-15 ENCOUNTER — Ambulatory Visit: Payer: 59 | Attending: Family Medicine | Admitting: Physical Therapy

## 2017-02-15 ENCOUNTER — Encounter: Payer: Self-pay | Admitting: Physical Therapy

## 2017-02-15 DIAGNOSIS — R279 Unspecified lack of coordination: Secondary | ICD-10-CM | POA: Diagnosis not present

## 2017-02-15 DIAGNOSIS — M6281 Muscle weakness (generalized): Secondary | ICD-10-CM | POA: Diagnosis not present

## 2017-02-15 NOTE — Patient Instructions (Signed)
About Abdominal Massage  Abdominal massage, also called external colon massage, is a self-treatment circular massage technique that can reduce and eliminate gas and ease constipation. The colon naturally contracts in waves in a clockwise direction starting from inside the right hip, moving up toward the ribs, across the belly, and down inside the left hip.  When you perform circular abdominal massage, you help stimulate your colon's normal wave pattern of movement called peristalsis.  It is most beneficial when done after eating.  Positioning You can practice abdominal massage with oil while lying down, or in the shower with soap.  Some people find that it is just as effective to do the massage through clothing while sitting or standing.  How to Massage Start by placing your finger tips or knuckles on your right side, just inside your hip bone.  . Make small circular movements while you move upward toward your rib cage.   . Once you reach the bottom right side of your rib cage, take your circular movements across to the left side of the bottom of your rib cage.  . Next, move downward until you reach the inside of your left hip bone.  This is the path your feces travel in your colon. . Continue to perform your abdominal massage in this pattern for 10 minutes each day.     You can apply as much pressure as is comfortable in your massage.  Start gently and build pressure as you continue to practice.  Notice any areas of pain as you massage; areas of slight pain may be relieved as you massage, but if you have areas of significant or intense pain, consult with your healthcare provider.  Other Considerations . General physical activity including bending and stretching can have a beneficial massage-like effect on the colon.  Deep breathing can also stimulate the colon because breathing deeply activates the same nervous system that supplies the colon.   . Abdominal massage should always be used in  combination with a bowel-conscious diet that is high in the proper type of fiber for you, fluids (primarily water), and a regular exercise program. [facility] [address], [address] [place], [place] [address] Phone # [phone] Fax [phone]  

## 2017-02-15 NOTE — Therapy (Signed)
[facility] [address], [address] [place], [place], [address] Phone: [phone]   Fax:  [phone]  Physical Therapy Evaluation  Patient Details  Name: Shelly [name] MRN: [id number] Date of Birth: May 16, 1956 Referring Provider: Dr. Rockney Ghee D. Clelia Croft  Encounter Date: 02/15/2017      PT End of Session - 02/15/17 1700    Visit Number 1   Date for PT Re-Evaluation 06/17/17   PT Start Time 1615   PT Stop Time 1700   PT Time Calculation (min) 45 min   Activity Tolerance Patient tolerated treatment well   Behavior During Therapy [facility] for tasks assessed/performed      Past Medical History:  Diagnosis Date  . Acid reflux   . Anxiety   . Arthritis   . Asthma    infrequent problems  . Cardiomyopathy (HCC)   . Complication of anesthesia    "I quit breathing during surgery" 2008 - has had surgery since with no problems   . Diabetes mellitus   . Endometriosis    hx of   . Headache    hx of migraines  . Hepatitis    hx of dx during ruptured appendix hospitalization  . Hyperlipemia   . Hypertension   . Irritable bowel syndrome   . Obesity   . Pneumonia 12/2013  . PONV (postoperative nausea and vomiting)   . Rash    sides of neck  . Stress incontinence   . Tuberculosis 1985    Past Surgical History:  Procedure Laterality Date  . ABDOMINAL ADHESION SURGERY     x 7  . ABDOMINAL HYSTERECTOMY    . APPENDECTOMY    . CHOLECYSTECTOMY    . COLON SURGERY  2010   colon resection due to adhesions  . LEFT AND RIGHT HEART CATHETERIZATION WITH CORONARY ANGIOGRAM N/A 06/29/2014   Procedure: LEFT AND RIGHT HEART CATHETERIZATION WITH CORONARY ANGIOGRAM;  Surgeon: Corky Crafts, MD;  Location: [facility] CATH LAB;  Service: Cardiovascular;  Laterality: N/A;  . PARTIAL KNEE ARTHROPLASTY Left 10/27/2013   Procedure: LEFT KNEE UNICOMPARTMENTAL REPLACEMENT ;  Surgeon: Loanne Drilling, MD;  Location: [facility]S;  Service: Orthopedics;   Laterality: Left;  . PARTIAL KNEE ARTHROPLASTY Right 05/21/2014   Procedure: MEDIAL UNICOMPARTMENTAL RIGHT KNEE ARTHROPLASTY;  Surgeon: Loanne Drilling, MD;  Location: [facility];  Service: Orthopedics;  Laterality: Right;  . ruptured appendix surgery    . TONSILLECTOMY      There were no vitals filed for this visit.       Subjective Assessment - 02/15/17 1623    Subjective Patient is having urinary leakage.  Patient reports her urterus pushed her bladder out and she had her bladder reconstructed. 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  She had stool come out of the vagina. The bladder leakage started 2013.     Patient Stated Goals prevent getting a surgery for a sling   Currently in Pain? Yes   Pain Score 3    Pain Location Bladder   Pain Orientation Mid;Lower   Pain Descriptors / Indicators Penetrating   Pain Type Chronic pain   Pain Onset More than a month ago   Pain Frequency Intermittent   Aggravating Factors  When gets get the urge to urinate   Multiple Pain Sites No            [facility] PT Assessment - 02/15/17 0001  Assessment   Medical Diagnosis R32 Urinary incontinence   Referring Provider Dr. Rockney Ghee D. Shaw   Onset Date/Surgical Date 07/14/11   Prior Therapy None     Precautions   Precautions None     Restrictions   Weight Bearing Restrictions No     Balance Screen   Has the patient fallen in the past 6 months No   Has the patient had a decrease in activity level because of a fear of falling?  No   Is the patient reluctant to leave their home because of a fear of falling?  No     Home [employer]ist information centre manager residence     Prior Function   Level of Independence Independent   Vocation Full time employment   Vocation Requirements climb ladders, towers   Leisure workout  3days per week on a total gym     Cognition   Overall Cognitive Status Within Functional Limits  for tasks assessed     Observation/Other Assessments   Skin Integrity lower abdominal scars are tight, lower abdominal overlaps over with moist skin   Focus on Therapeutic Outcomes (FOTO)  53% limitation for urinary problem survey  goal is 48% limitation     Posture/Postural Control   Posture/Postural Control No significant limitations     ROM / Strength   AROM / PROM / Strength AROM;PROM;Strength     AROM   Overall AROM Comments Lumbar ROM is full     Strength   Overall Strength Comments abdominal strength is 2/5      Palpation   SI assessment  right ilium is rotated anteriorly   Palpation comment tenderness located in lower abdomen     Transfers   Transfers Not assessed     Ambulation/Gait   Ambulation/Gait No            Objective measurements completed on examination: See above findings.        Pelvic Floor Special Questions - 02/15/17 0001    Currently Sexually Active No   Urinary Leakage Yes   Pad use 1  always discreet   Activities that cause leaking Coughing;Sneezing;Laughing;Bending;Walking;Lifting   Urinary urgency Yes   Fecal incontinence Yes  intermittently   Skin Integrity Intact   Prolapse None   Pelvic Floor Internal Exam Patient confirms identification and approves PT to assess pelvic floor muscle integrity   Exam Type Vaginal   Palpation tenderness located on the left obturator and levator ani muscles, tightness located on bil. urethra sphincter   Strength fair squeeze, definite lift                  PT Education - 02/15/17 1700    Education provided Yes   Education Details abdominal massage   Person(s) Educated Patient   Methods Explanation;Demonstration;Verbal cues;Handout   Comprehension Verbalized understanding;Returned demonstration          PT Short Term Goals - 02/15/17 1711      PT SHORT TERM GOAL #1   Title independent with initial HEP   Time 4   Period Weeks   Status New   Target Date 03/15/17     PT  SHORT TERM GOAL #2   Title urinary leakage decreased >/= 25%   Time 4   Period Weeks   Status New   Target Date 03/15/17     PT SHORT TERM GOAL #3   Title understand how to toilet correctly and fully empty her bladder   Time 4  Period Weeks   Status New   Target Date 03/15/17     PT SHORT TERM GOAL #4   Title bladder pain decreased >/= 25% due to improved tissue mobility    Time 4   Period Weeks   Status New   Target Date 03/15/17           PT Long Term Goals - 02/15/17 1714      PT LONG TERM GOAL #1   Title independent with HEP   Time 4   Period Months   Status New   Target Date 06/17/17     PT LONG TERM GOAL #2   Title abdominal strength inceased >/= 4/5 so she is able to push her bowels out with less straining   Time 4   Period Months   Status New   Target Date 06/17/17     PT LONG TERM GOAL #3   Title urinary leakage decreased >/= 60% due to increased pelvic floor strength and coordination    Time 4   Period Months   Status New   Target Date 06/17/17     PT LONG TERM GOAL #4   Title fecal leakage decreased >/= 60% due to improve pelvic floor coordination and reduction of abdominal valsalva manuever   Time 4   Period Months   Status New   Target Date 06/17/17     PT LONG TERM GOAL #5   Title abdominal pain decreased >/= 50% due to improved tissue mobility   Time 4   Period Months   Status New   Target Date 06/17/17                Plan - 02/15/17 1701    Clinical Impression Statement Patient is a 61 year old female with urinary and fecal leakage.  Patient has had extensive abdominal surgeries and endometriosis. Patient will have bladder pain when she gets the urge to urinate  at 3/10.  Patient pelvic floor strength is 3/5 and when she bears down to assess for a prolapse there is urinary leakage.  Sporadically patient will have fecal leakage.  Patient will leak urine with activity, laughing, sneezing, lifting, and coughing.  Patient has  tenderness located in the abdomen especially lower with decreased tissue mobility.  Lower abdominal area has a tissue roll with the area shiny and moist.  Patient has difficulty with fully emptying her bladder.  Patient will benefit from skilled therapy to work on strength, increased tissue mobility, improve coordination for the pelvic floor and abdominal contraction.    History and Personal Factors relevant to plan of care: recurrent UTI's, endometriosis, abdominal surgeries for scar tissue removal, abdominal hysterectomy, colon surgery to resect due to adhesions   Clinical Presentation Evolving   Clinical Presentation due to: evolving due to her urinary leakage increasing and pain increasing.    Clinical Decision Making Moderate   Rehab Potential Excellent   Clinical Impairments Affecting Rehab Potential recurrent UTI's, endometriosis, abdominal surgeries for scar tissue removal, abdominal hysterectomy, colon surgery to resect due to adhesions   PT Frequency 1x / week   PT Duration Other (comment)  4 months   PT Treatment/Interventions Biofeedback;Moist Heat;Ultrasound;Therapeutic activities;Therapeutic exercise;Neuromuscular re-education;Patient/family education;Passive range of motion;Scar mobilization;Manual techniques;Dry needling;Taping   PT Next Visit Plan toileting; soft tissue work; pelvic floor contraction; bladder irritants; abdominal bracing   PT Home Exercise Plan toileting technique   Consulted and Agree with Plan of Care Patient      Patient will benefit  from skilled therapeutic intervention in order to improve the following deficits and impairments:  Decreased coordination, Increased fascial restricitons, Increased muscle spasms, Pain, Decreased activity tolerance, Decreased strength, Decreased scar mobility  Visit Diagnosis: Muscle weakness (generalized) - Plan: PT plan of care cert/re-cert  Unspecified lack of coordination - Plan: PT plan of care cert/re-cert     Problem  List Patient Active Problem List   Diagnosis Date Noted  . Allergic reaction 04/21/2015  . Type 2 diabetes, HbA1c goal < 7% (HCC)   . Pyelonephritis 04/20/2015  . Chronic systolic (congestive) heart failure (HCC) 04/20/2015  . Anemia 04/18/2015  . Atherosclerosis of aorta (HCC) 04/18/2015  . Heart failure (HCC) 04/18/2015  . Chronic kidney disease, stage III (moderate) 04/18/2015  . Gastro-esophageal reflux disease without esophagitis 04/18/2015  . Hypertensive heart and chronic kidney disease with heart failure and stage 1 through stage 4 chronic kidney disease, or chronic kidney disease (HCC) 04/18/2015  . Mixed hyperlipidemia 04/18/2015  . Mood disorder (HCC) 04/18/2015  . Secondary pulmonary hypertension 04/18/2015  . History of colonic polyps 04/18/2015  . Type 2 diabetes mellitus with diabetic nephropathy (HCC) 04/18/2015  . 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 Dyspnea   . SOB (shortness of breath)   . Acute CHF (HCC) 06/28/2014  . Acute systolic congestive heart failure (HCC)   . OA (osteoarthritis) of knee 10/27/2013  . Dehydration 09/12/2011  . ARF (acute renal failure) (HCC) 09/12/2011  . Diarrhea 09/12/2011  . Nausea and vomiting 09/12/2011  . Diabetes mellitus (HCC) 09/12/2011  . Asthma 09/12/2011  . Hypertension   . Hyperlipemia   . Acid reflux   . Anxiety   . Obesity     Eulis Foster, PT 02/15/17 5:21 PM    [facility] [address] [place], [place], [address] Phone: [phone]   Fax:  [phone]  Name: DERRIAN RODAK MRN: [id number] Date of Birth: Mar 23, 1956

## 2017-02-18 ENCOUNTER — Ambulatory Visit: Payer: 59 | Admitting: Physical Therapy

## 2017-02-23 ENCOUNTER — Ambulatory Visit: Payer: 59 | Admitting: Physical Therapy

## 2017-02-23 ENCOUNTER — Encounter: Payer: Self-pay | Admitting: Physical Therapy

## 2017-02-23 DIAGNOSIS — R279 Unspecified lack of coordination: Secondary | ICD-10-CM | POA: Diagnosis not present

## 2017-02-23 DIAGNOSIS — M6281 Muscle weakness (generalized): Secondary | ICD-10-CM

## 2017-02-23 NOTE — Therapy (Signed)
[facility] [address] [place], [place], [address] Phone: [phone]   Fax:  [phone]  Physical Therapy Treatment  Patient Details  Name: Shelly [name] MRN: [id number] Date of Birth: Oct 13, 1955 Referring Provider: Dr. Nathen May D. Brigitte Pulse  Encounter Date: 02/23/2017      PT End of Session - 02/23/17 0843    Visit Number 2   Date for PT Re-Evaluation 06/17/17   PT Start Time 0811  came late   PT Stop Time 0845   PT Time Calculation (min) 34 min   Activity Tolerance Patient tolerated treatment well   Behavior During Therapy [facility] for tasks assessed/performed      Past Medical History:  Diagnosis Date  . Acid reflux   . Anxiety   . Arthritis   . Asthma    infrequent problems  . Cardiomyopathy ([place])   . Complication of anesthesia    "I quit breathing during surgery" 2008 - has had surgery since with no problems   . Diabetes mellitus   . Endometriosis    hx of   . Headache    hx of migraines  . Hepatitis    hx of dx during ruptured appendix hospitalization  . Hyperlipemia   . Hypertension   . Irritable bowel syndrome   . Obesity   . Pneumonia 12/2013  . PONV (postoperative nausea and vomiting)   . Rash    sides of neck  . Stress incontinence   . Tuberculosis 1985    Past Surgical History:  Procedure Laterality Date  . ABDOMINAL ADHESION SURGERY     x 7  . ABDOMINAL HYSTERECTOMY    . APPENDECTOMY    . CHOLECYSTECTOMY    . COLON SURGERY  2010   colon resection due to adhesions  . LEFT AND RIGHT HEART CATHETERIZATION WITH CORONARY ANGIOGRAM N/A 06/29/2014   Procedure: LEFT AND RIGHT HEART CATHETERIZATION WITH CORONARY ANGIOGRAM;  Surgeon: Jettie Booze, MD;  Location: [facility] CATH LAB;  Service: Cardiovascular;  Laterality: N/A;  . PARTIAL KNEE ARTHROPLASTY Left 10/27/2013   Procedure: LEFT KNEE UNICOMPARTMENTAL REPLACEMENT ;  Surgeon: Gearlean Alf, MD;  Location: [facility];  Service:  Orthopedics;  Laterality: Left;  . PARTIAL KNEE ARTHROPLASTY Right 05/21/2014   Procedure: MEDIAL UNICOMPARTMENTAL RIGHT KNEE ARTHROPLASTY;  Surgeon: Gearlean Alf, MD;  Location: [facility];  Service: Orthopedics;  Laterality: Right;  . ruptured appendix surgery    . TONSILLECTOMY      There were no vitals filed for this visit.      Subjective Assessment - 02/23/17 0812    Subjective I am having alot of pain and I think it is do to having constipation. Last bowel movement was 3 days ago.    Patient Stated Goals prevent getting a surgery for a sling   Currently in Pain? Yes   Pain Score 4    Pain Location Abdomen  to the back   Pain Orientation Left   Pain Descriptors / Indicators Stabbing;Burning   Pain Type Chronic pain   Pain Onset More than a month ago   Pain Frequency Intermittent   Aggravating Factors  no frequent bowel movement   Pain Relieving Factors havign a bowel movement   Multiple Pain Sites No                         [facility] Adult PT Treatment/Exercise - 02/23/17 0001      Manual Therapy  Manual Therapy Soft tissue mobilization;Myofascial release   Soft tissue mobilization abdominal wall in supine with hips elevated   Myofascial Release quadruped with pulling skin forward to release the organs from the back; supine with hips elevated with lifting the intestines off the bladder, lift the descending colon away from the abdominal wall, work around the umbilicus                PT Education - 02/23/17 0843    Education provided Yes   Education Details lay with hips elevated to reduce pain   Person(s) Educated Patient   Methods Explanation;Demonstration   Comprehension Verbalized understanding;Returned demonstration          PT Short Term Goals - 02/23/17 0846      PT SHORT TERM GOAL #1   Title independent with initial HEP   Time 4   Period Weeks   Status On-going     PT SHORT TERM GOAL #2   Title urinary leakage decreased >/= 25%    Time 4   Period Weeks   Status On-going     PT SHORT TERM GOAL #3   Title understand how to toilet correctly and fully empty her bladder   Time 4   Period Weeks   Status On-going     PT SHORT TERM GOAL #4   Title bladder pain decreased >/= 25% due to improved tissue mobility    Time 4   Period Weeks   Status On-going           PT Long Term Goals - 02/15/17 1714      PT LONG TERM GOAL #1   Title independent with HEP   Time 4   Period Months   Status New   Target Date 06/17/17     PT LONG TERM GOAL #2   Title abdominal strength inceased >/= 4/5 so she is able to push her bowels out with less straining   Time 4   Period Months   Status New   Target Date 06/17/17     PT LONG TERM GOAL #3   Title urinary leakage decreased >/= 60% due to increased pelvic floor strength and coordination    Time 4   Period Months   Status New   Target Date 06/17/17     PT LONG TERM GOAL #4   Title fecal leakage decreased >/= 60% due to improve pelvic floor coordination and reduction of abdominal valsalva manuever   Time 4   Period Months   Status New   Target Date 06/17/17     PT LONG TERM GOAL #5   Title abdominal pain decreased >/= 50% due to improved tissue mobility   Time 4   Period Months   Status New   Target Date 06/17/17               Plan - 02/23/17 0844    Clinical Impression Statement After therapy patient had no pain.  Patient had good bowel sounds during soft tissue work.  Patient had many restrictions in the abdomen with pain.  Patient has not met goals due to just starting therapy.  Patient will benefit from skilled therapy to work on strength, increase tissue mobility, improve coordination for the pelvic floor and abdominal contraction.    Rehab Potential Excellent   Clinical Impairments Affecting Rehab Potential recurrent UTI's, endometriosis, abdominal surgeries for scar tissue removal, abdominal hysterectomy, colon surgery to resect due to adhesions    PT Frequency 1x /  week   PT Duration Other (comment)  4 months   PT Treatment/Interventions Biofeedback;Moist Heat;Ultrasound;Therapeutic activities;Therapeutic exercise;Neuromuscular re-education;Patient/family education;Passive range of motion;Scar mobilization;Manual techniques;Dry needling;Taping   PT Next Visit Plan toileting; soft tissue work; pelvic floor contraction; bladder irritants; abdominal bracing   PT Home Exercise Plan toileting technique   Consulted and Agree with Plan of Care Patient      Patient will benefit from skilled therapeutic intervention in order to improve the following deficits and impairments:  Decreased coordination, Increased fascial restricitons, Increased muscle spasms, Pain, Decreased activity tolerance, Decreased strength, Decreased scar mobility  Visit Diagnosis: Muscle weakness (generalized)  Unspecified lack of coordination     Problem List Patient Active Problem List   Diagnosis Date Noted  . Allergic reaction 04/21/2015  . Type 2 diabetes, HbA1c goal < 7% (HCC)   . Pyelonephritis 04/20/2015  . Chronic systolic (congestive) heart failure ([place]) 04/20/2015  . Anemia 04/18/2015  . Atherosclerosis of aorta ([place]) 04/18/2015  . Heart failure ([place]) 04/18/2015  . Chronic kidney disease, stage III (moderate) 04/18/2015  . Gastro-esophageal reflux disease without esophagitis 04/18/2015  . Hypertensive heart and chronic kidney disease with heart failure and stage 1 through stage 4 chronic kidney disease, or chronic kidney disease ([place]) 04/18/2015  . Mixed hyperlipidemia 04/18/2015  . Mood disorder ([place]) 04/18/2015  . Secondary pulmonary hypertension 04/18/2015  . History of colonic polyps 04/18/2015  . Type 2 diabetes mellitus with diabetic nephropathy ([place]) 04/18/2015  . 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 Dyspnea   . SOB (shortness of breath)   . Acute CHF ([place]) 06/28/2014  . Acute systolic congestive heart failure ([place])   . OA  (osteoarthritis) of knee 10/27/2013  . Dehydration 09/12/2011  . ARF (acute renal failure) ([place]) 09/12/2011  . Diarrhea 09/12/2011  . Nausea and vomiting 09/12/2011  . Diabetes mellitus ([place]) 09/12/2011  . Asthma 09/12/2011  . Hypertension   . Hyperlipemia   . Acid reflux   . Anxiety   . Obesity     Earlie Counts, PT 02/23/17 8:47 AM   [facility] [address], [place], [place], [address] Phone: [phone]   Fax:  [phone]  Name: VEANNA DOWER MRN: [id number] Date of Birth: 06-Jan-1956

## 2017-03-03 ENCOUNTER — Ambulatory Visit: Payer: 59 | Admitting: Physical Therapy

## 2017-03-03 ENCOUNTER — Encounter: Payer: Self-pay | Admitting: Physical Therapy

## 2017-03-03 DIAGNOSIS — R279 Unspecified lack of coordination: Secondary | ICD-10-CM | POA: Diagnosis not present

## 2017-03-03 DIAGNOSIS — M6281 Muscle weakness (generalized): Secondary | ICD-10-CM

## 2017-03-03 NOTE — Therapy (Signed)
[facility] [address] [place], [place], [address] Phone: [phone]   Fax:  [phone]  Physical Therapy Treatment  Patient Details  Name: Shelly [name] MRN: [id number] Date of Birth: [birth year] Referring Provider: Dr. Rockney Ghee D. Clelia Croft  Encounter Date: 03/03/2017      PT End of Session - 03/03/17 1611    Visit Number 3   Date for PT Re-Evaluation 06/17/17   PT Start Time 1530   PT Stop Time 1612   PT Time Calculation (min) 42 min   Activity Tolerance Patient tolerated treatment well   Behavior During Therapy [facility] for tasks assessed/performed      Past Medical History:  Diagnosis Date  . Acid reflux   . Anxiety   . Arthritis   . Asthma    infrequent problems  . Cardiomyopathy (HCC)   . Complication of anesthesia    "I quit breathing during surgery" 2008 - has had surgery since with no problems   . Diabetes mellitus   . Endometriosis    hx of   . Headache    hx of migraines  . Hepatitis    hx of dx during ruptured appendix hospitalization  . Hyperlipemia   . Hypertension   . Irritable bowel syndrome   . Obesity   . Pneumonia 12/2013  . PONV (postoperative nausea and vomiting)   . Rash    sides of neck  . Stress incontinence   . Tuberculosis 1985    Past Surgical History:  Procedure Laterality Date  . ABDOMINAL ADHESION SURGERY     x 7  . ABDOMINAL HYSTERECTOMY    . APPENDECTOMY    . CHOLECYSTECTOMY    . COLON SURGERY  2010   colon resection due to adhesions  . LEFT AND RIGHT HEART CATHETERIZATION WITH CORONARY ANGIOGRAM N/A 06/29/2014   Procedure: LEFT AND RIGHT HEART CATHETERIZATION WITH CORONARY ANGIOGRAM;  Surgeon: Corky Crafts, MD;  Location: [facility] C[facility] LAB;  Service: Cardiovascular;  Laterality: N/A;  . PARTIAL KNEE ARTHROPLASTY Left 10/27/2013   Procedure: LEFT KNEE UNICOMPARTMENTAL REPLACEMENT ;  Surgeon: Loanne Drilling, MD;  Location: [facility];  Service: Orthopedics;   Laterality: Left;  . PARTIAL KNEE ARTHROPLASTY Right 05/21/2014   Procedure: MEDIAL UNICOMPARTMENTAL RIGHT KNEE ARTHROPLASTY;  Surgeon: Loanne Drilling, MD;  Location: [facility];  Service: Orthopedics;  Laterality: Right;  . ruptured appendix surgery    . TONSILLECTOMY      There were no vitals filed for this visit.      Subjective Assessment - 03/03/17 1534    Subjective I do not feel like I have to urinate and I leak.  The soft tissue work helped me have a bowel movement afterwards and reduced pain.    Patient Stated Goals prevent getting a surgery for a sling   Currently in Pain? No/denies   Multiple Pain Sites No                         [facility] Adult PT Treatment/Exercise - 03/03/17 0001      Self-Care   Self-Care Other Self-Care Comments   Other Self-Care Comments  how to have a bowel movement and urinate fully with correct posture     Manual Therapy   Manual Therapy Soft tissue mobilization;Myofascial release   Soft tissue mobilization abdominal massage to left lateral abdomen in right sidely wiht good bowel sounds  PT Education - 03/03/17 1611    Education provided Yes   Education Details toileting  technique and urinary technique   Person(s) Educated Patient   Methods Explanation;Demonstration;Verbal cues;Handout   Comprehension Verbalized understanding;Returned demonstration          PT Short Term Goals - 03/03/17 1614      PT SHORT TERM GOAL #1   Period Weeks   Status Achieved     PT SHORT TERM GOAL #2   Title urinary leakage decreased >/= 25%   Time 4   Period Weeks   Status On-going     PT SHORT TERM GOAL #3   Title understand how to toilet correctly and fully empty her bladder   Period Weeks   Status Achieved     PT SHORT TERM GOAL #4   Title bladder pain decreased >/= 25% due to improved tissue mobility    Time 4   Period Weeks   Status On-going           PT Long Term Goals - 02/15/17 1714      PT  LONG TERM GOAL #1   Title independent with HEP   Time 4   Period Months   Status New   Target Date 06/17/17     PT LONG TERM GOAL #2   Title abdominal strength inceased >/= 4/5 so she is able to push her bowels out with less straining   Time 4   Period Months   Status New   Target Date 06/17/17     PT LONG TERM GOAL #3   Title urinary leakage decreased >/= 60% due to increased pelvic floor strength and coordination    Time 4   Period Months   Status New   Target Date 06/17/17     PT LONG TERM GOAL #4   Title fecal leakage decreased >/= 60% due to improve pelvic floor coordination and reduction of abdominal valsalva manuever   Time 4   Period Months   Status New   Target Date 06/17/17     PT LONG TERM GOAL #5   Title abdominal pain decreased >/= 50% due to improved tissue mobility   Time 4   Period Months   Status New   Target Date 06/17/17               Plan - 03/03/17 1537    Clinical Impression Statement Patient understands how to toilet technique to urinate and have a bowel movement.  Patient had pain in left side and improve after soft tissue work. Patient has trouble with constipation  which places pressure on the bladder causing pain.  Patient will benefit from skilled therapy to increase tissue mobility, improve coordination for the pelvic floor and abdominal contraction.    Rehab Potential Excellent   Clinical Impairments Affecting Rehab Potential recurrent UTI's, endometriosis, abdominal surgeries for scar tissue removal, abdominal hysterectomy, colon surgery to resect due to adhesions   PT Frequency 1x / week   PT Duration Other (comment)  4 months   PT Treatment/Interventions Biofeedback;Moist Heat;Ultrasound;Therapeutic activities;Therapeutic exercise;Neuromuscular re-education;Patient/family education;Passive range of motion;Scar mobilization;Manual techniques;Dry needling;Taping   PT Next Visit Plan  soft tissue work; pelvic floor contraction; bladder  irritants; abdominal bracing   PT Home Exercise Plan progress as needed   Recommended Other Services MD signed intial summary   Consulted and Agree with Plan of Care Patient      Patient will benefit from skilled therapeutic intervention in order to improve the  following deficits and impairments:  Decreased coordination, Increased fascial restricitons, Increased muscle spasms, Pain, Decreased activity tolerance, Decreased strength, Decreased scar mobility  Visit Diagnosis: Unspecified lack of coordination  Muscle weakness (generalized)     Problem List Patient Active Problem List   Diagnosis Date Noted  . Allergic reaction 04/21/2015  . Type 2 diabetes, HbA1c goal < 7% (HCC)   . Pyelonephritis 04/20/2015  . Chronic systolic (congestive) heart failure (HCC) 04/20/2015  . Anemia 04/18/2015  . Atherosclerosis of aorta (HCC) 04/18/2015  . Heart failure (HCC) 04/18/2015  . Chronic kidney disease, stage III (moderate) 04/18/2015  . Gastro-esophageal reflux disease without esophagitis 04/18/2015  . Hypertensive heart and chronic kidney disease with heart failure and stage 1 through stage 4 chronic kidney disease, or chronic kidney disease (HCC) 04/18/2015  . Mixed hyperlipidemia 04/18/2015  . Mood disorder (HCC) 04/18/2015  . Secondary pulmonary hypertension 04/18/2015  . History of colonic polyps 04/18/2015  . Type 2 diabetes mellitus with diabetic nephropathy (HCC) 04/18/2015  . 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 Dyspnea   . SOB (shortness of breath)   . Acute CHF (HCC) 06/28/2014  . Acute systolic congestive heart failure (HCC)   . OA (osteoarthritis) of knee 10/27/2013  . Dehydration 09/12/2011  . ARF (acute renal failure) (HCC) 09/12/2011  . Diarrhea 09/12/2011  . Nausea and vomiting 09/12/2011  . Diabetes mellitus (HCC) 09/12/2011  . Asthma 09/12/2011  . Hypertension   . Hyperlipemia   . Acid reflux   . Anxiety   . Obesity     Eulis Foster, PT 03/03/17 4:16  PM   [facility] [address], [address] [place], [place], [address] Phone: [phone]   Fax:  [phone]  Name: Shelly [name] MRN: [id number] Date of Birth: 05-01-1956

## 2017-03-03 NOTE — Patient Instructions (Addendum)
Toileting Techniques for Bowel Movements (Defecation) Using your belly (abdomen) and pelvic floor muscles to have a bowel movement is usually instinctive.  Sometimes people can have problems with these muscles and have to relearn proper defecation (emptying) techniques.  If you have weakness in your muscles, organs that are falling out, decreased sensation in your pelvis, or ignore your urge to go, you may find yourself straining to have a bowel movement.  You are straining if you are: . holding your breath or taking in a huge gulp of air and holding it  . keeping your lips and jaw tensed and closed tightly . turning red in the face because of excessive pushing or forcing . developing or worsening your  hemorrhoids . getting faint while pushing . not emptying completely and have to defecate many times a day  If you are straining, you are actually making it harder for yourself to have a bowel movement.  Many people find they are pulling up with the pelvic floor muscles and closing off instead of opening the anus. Due to lack pelvic floor relaxation and coordination the abdominal muscles, one has to work harder to push the feces out.  Many people have never been taught how to defecate efficiently and effectively.  Notice what happens to your body when you are having a bowel movement.  While you are sitting on the toilet pay attention to the following areas: . Jaw and mouth position . Angle of your hips   . Whether your feet touch the ground or not . Arm placement  . Spine position . Waist . Belly tension . Anus (opening of the anal canal)  An Evacuation/Defecation Plan   Here are the 4 basic points:  1. Lean forward enough for your elbows to rest on your knees 2. Support your feet on the floor or use a low stool if your feet don't touch the floor  3. Push out your belly as if you have swallowed a beach ball-you should feel a widening of your waist 4. Open and relax your pelvic floor muscles,  rather than tightening around the anus      The following conditions my require modifications to your toileting posture:  . If you have had surgery in the past that limits your back, hip, pelvic, knee or ankle flexibility . Constipation   Your healthcare practitioner may make the following additional suggestions and adjustments:  1) Sit on the toilet  a) Make sure your feet are supported. b) Notice your hip angle and spine position-most people find it effective to lean forward or raise their knees, which can help the muscles around the anus to relax  c) When you lean forward, place your forearms on your thighs for support  2) Relax suggestions a) Breath deeply in through your nose and out slowly through your mouth as if you are smelling the flowers and blowing out the candles. b) To become aware of how to relax your muscles, contracting and releasing muscles can be helpful.  Pull your pelvic floor muscles in tightly by using the image of holding back gas, or closing around the anus (visualize making a circle smaller) and lifting the anus up and in.  Then release the muscles and your anus should drop down and feel open. Repeat 5 times ending with the feeling of relaxation. c) Keep your pelvic floor muscles relaxed; let your belly bulge out. d) The digestive tract starts at the mouth and ends at the anal opening, so be   sure to relax both ends of the tube.  Place your tongue on the roof of your mouth with your teeth separated.  This helps relax your mouth and will help to relax the anus at the same time.  3) Empty (defecation) a) Keep your pelvic floor and sphincter relaxed, then bulge your anal muscles.  Make the anal opening wide.  b) Stick your belly out as if you have swallowed a beach ball. c) Make your belly wall hard using your belly muscles while continuing to breathe. Doing this makes it easier to open your anus. d) Breath out and give a grunt (or try using other sounds such as  ahhhh, shhhhh, ohhhh or grrrrrrr).  4) Finish a) As you finish your bowel movement, pull the pelvic floor muscles up and in.  This will leave your anus in the proper place rather than remaining pushed out and down. If you leave your anus pushed out and down, it will start to feel as though that is normal and give you incorrect signals about needing to have a bowel movement.  Toilet Meditation PhysioYoga   Shelly [name]    Sitting    Sit comfortably. Allow body's muscles to relax. Place hands on belly. Inhale slowly and deeply for _3__ seconds, so hands move out. Then take _3__ seconds to exhale. Repeat _5-10__ times. Prior to bathroom.  Copyright  VHI. All rights reserved.    How to Empty Bladder Contents to Overcome Bladder Emptying Problems   [facility] [address] [place], [place] [address] Phone # [phone] Fax [phone]

## 2017-03-04 DIAGNOSIS — N183 Chronic kidney disease, stage 3 (moderate): Secondary | ICD-10-CM | POA: Diagnosis not present

## 2017-03-09 ENCOUNTER — Encounter: Payer: Self-pay | Admitting: Physical Therapy

## 2017-03-09 ENCOUNTER — Ambulatory Visit: Payer: 59 | Admitting: Physical Therapy

## 2017-03-09 DIAGNOSIS — R279 Unspecified lack of coordination: Secondary | ICD-10-CM | POA: Diagnosis not present

## 2017-03-09 DIAGNOSIS — M6281 Muscle weakness (generalized): Secondary | ICD-10-CM

## 2017-03-09 NOTE — Therapy (Signed)
[facility] [address], [address] [place], [place], [address] Phone: [phone]   Fax:  [phone]  Physical Therapy Treatment  Patient Details  Name: Shelly [name] MRN: [id number] Date of Birth: February 17, 1956 Referring Provider: Dr. Rockney Ghee D. Clelia Croft  Encounter Date: 03/09/2017      PT End of Session - 03/09/17 1701    Visit Number 4   Date for PT Re-Evaluation 06/17/17   PT Start Time 1615   PT Stop Time 1655   PT Time Calculation (min) 40 min   Activity Tolerance Patient tolerated treatment well   Behavior During Therapy [facility] for tasks assessed/performed      Past Medical History:  Diagnosis Date  . Acid reflux   . Anxiety   . Arthritis   . Asthma    infrequent problems  . Cardiomyopathy (HCC)   . Complication of anesthesia    "I quit breathing during surgery" 2008 - has had surgery since with no problems   . Diabetes mellitus   . Endometriosis    hx of   . Headache    hx of migraines  . Hepatitis    hx of dx during ruptured appendix hospitalization  . Hyperlipemia   . Hypertension   . Irritable bowel syndrome   . Obesity   . Pneumonia 12/2013  . PONV (postoperative nausea and vomiting)   . Rash    sides of neck  . Stress incontinence   . Tuberculosis 1985    Past Surgical History:  Procedure Laterality Date  . ABDOMINAL ADHESION SURGERY     x 7  . ABDOMINAL HYSTERECTOMY    . APPENDECTOMY    . CHOLECYSTECTOMY    . COLON SURGERY  2010   colon resection due to adhesions  . LEFT AND RIGHT HEART CATHETERIZATION WITH CORONARY ANGIOGRAM N/A 06/29/2014   Procedure: LEFT AND RIGHT HEART CATHETERIZATION WITH CORONARY ANGIOGRAM;  Surgeon: Corky Crafts, MD;  Location: [facility] CATH LAB;  Service: Cardiovascular;  Laterality: N/A;  . PARTIAL KNEE ARTHROPLASTY Left 10/27/2013   Procedure: LEFT KNEE UNICOMPARTMENTAL REPLACEMENT ;  Surgeon: Loanne Drilling, MD;  Location: [facility];  Service: Orthopedics;   Laterality: Left;  . PARTIAL KNEE ARTHROPLASTY Right 05/21/2014   Procedure: MEDIAL UNICOMPARTMENTAL RIGHT KNEE ARTHROPLASTY;  Surgeon: Loanne Drilling, MD;  Location: [facility];  Service: Orthopedics;  Laterality: Right;  . ruptured appendix surgery    . TONSILLECTOMY      There were no vitals filed for this visit.      Subjective Assessment - 03/09/17 1618    Subjective No pain.  I think I have a UTI and started antibiotics yesterday.  I have not had a bowel movement in the 3 days. I feel like I am emptying my bladder fully.  I am using the stool for bowel movement.    Patient Stated Goals prevent getting a surgery for a sling   Currently in Pain? No/denies                         [facility] Adult PT Treatment/Exercise - 03/09/17 0001      Manual Therapy   Manual Therapy Soft tissue mobilization;Myofascial release   Soft tissue mobilization circular massage to abdomen; scar massage to lower abdomen   Myofascial Release release of the 3 planes of fascia of the urogenital diaphragm, release of the pubovesical ligaments, lift of the lateral sides of the abdomen upward, release around the  umbilicus                  PT Short Term Goals - 03/09/17 1624      PT SHORT TERM GOAL #2   Title urinary leakage decreased >/= 25%   Time 4   Period Weeks   Status On-going  no change in pad wetness     PT SHORT TERM GOAL #4   Title bladder pain decreased >/= 25% due to improved tissue mobility    Time 4   Period Weeks   Status Achieved           PT Long Term Goals - 03/09/17 1622      PT LONG TERM GOAL #1   Title independent with HEP   Time 4   Period Months   Status On-going     PT LONG TERM GOAL #2   Title abdominal strength inceased >/= 4/5 so she is able to push her bowels out with less straining   Time 4   Period Months   Status On-going     PT LONG TERM GOAL #3   Title urinary leakage decreased >/= 60% due to increased pelvic floor strength and  coordination    Time 4   Period Months   Status On-going     PT LONG TERM GOAL #4   Title fecal leakage decreased >/= 60% due to improve pelvic floor coordination and reduction of abdominal valsalva manuever   Period Months   Status On-going  no difficulty at this time     PT LONG TERM GOAL #5   Title abdominal pain decreased >/= 50% due to improved tissue mobility   Time 4   Period Months   Status On-going  30% better               Plan - 03/09/17 1702    Clinical Impression Statement After therapy gurgling sounds of the abdomen were heard and increased in soft tissue.  Tightness is located on the horizontal scar just above the pubic bone.  Patient is able to empty her bladder better and is using the squatty potty for bowel movements.  Patient is still leaking urine and is worse with consitpation.  Patient will benefit from skilled therapy to improve pelvic floor strength and coordination for bowel movement.    Rehab Potential Excellent   Clinical Impairments Affecting Rehab Potential recurrent UTI's, endometriosis, abdominal surgeries for scar tissue removal, abdominal hysterectomy, colon surgery to resect due to adhesions   PT Frequency 1x / week   PT Duration Other (comment)  4 months   PT Treatment/Interventions Biofeedback;Moist Heat;Ultrasound;Therapeutic activities;Therapeutic exercise;Neuromuscular re-education;Patient/family education;Passive range of motion;Scar mobilization;Manual techniques;Dry needling;Taping   PT Next Visit Plan  soft tissue work; pelvic floor contraction;  abdominal bracing   PT Home Exercise Plan progress as needed   Consulted and Agree with Plan of Care Patient      Patient will benefit from skilled therapeutic intervention in order to improve the following deficits and impairments:  Decreased coordination, Increased fascial restricitons, Increased muscle spasms, Pain, Decreased activity tolerance, Decreased strength, Decreased scar  mobility  Visit Diagnosis: Unspecified lack of coordination  Muscle weakness (generalized)     Problem List Patient Active Problem List   Diagnosis Date Noted  . Allergic reaction 04/21/2015  . Type 2 diabetes, HbA1c goal < 7% (HCC)   . Pyelonephritis 04/20/2015  . Chronic systolic (congestive) heart failure (HCC) 04/20/2015  . Anemia 04/18/2015  . Atherosclerosis of aorta (  HCC) 04/18/2015  . Heart failure (HCC) 04/18/2015  . Chronic kidney disease, stage III (moderate) 04/18/2015  . Gastro-esophageal reflux disease without esophagitis 04/18/2015  . Hypertensive heart and chronic kidney disease with heart failure and stage 1 through stage 4 chronic kidney disease, or chronic kidney disease (HCC) 04/18/2015  . Mixed hyperlipidemia 04/18/2015  . Mood disorder (HCC) 04/18/2015  . Secondary pulmonary hypertension 04/18/2015  . History of colonic polyps 04/18/2015  . Type 2 diabetes mellitus with diabetic nephropathy (HCC) 04/18/2015  . 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 Dyspnea   . SOB (shortness of breath)   . Acute CHF (HCC) 06/28/2014  . Acute systolic congestive heart failure (HCC)   . OA (osteoarthritis) of knee 10/27/2013  . Dehydration 09/12/2011  . ARF (acute renal failure) (HCC) 09/12/2011  . Diarrhea 09/12/2011  . Nausea and vomiting 09/12/2011  . Diabetes mellitus (HCC) 09/12/2011  . Asthma 09/12/2011  . Hypertension   . Hyperlipemia   . Acid reflux   . Anxiety   . Obesity    Eulis Foster, PT 03/09/17 5:07 PM   [facility] [address] [place], [place], [address] Phone: [phone]   Fax:  [phone]  Name: Shelly [name] MRN: [id number] Date of Birth: May 31, 1956

## 2017-03-17 ENCOUNTER — Ambulatory Visit: Payer: 59 | Attending: Family Medicine | Admitting: Physical Therapy

## 2017-03-17 ENCOUNTER — Encounter: Payer: Self-pay | Admitting: Physical Therapy

## 2017-03-17 DIAGNOSIS — R279 Unspecified lack of coordination: Secondary | ICD-10-CM | POA: Insufficient documentation

## 2017-03-17 DIAGNOSIS — M6281 Muscle weakness (generalized): Secondary | ICD-10-CM

## 2017-03-17 NOTE — Therapy (Addendum)
[facility] [address], [place], [place], [address] Phone: [phone]   Fax:  [phone]  Physical Therapy Treatment  Patient Details  Name: Shelly [name] MRN: [id number] Date of Birth: 06-14-1956 Referring Provider: Dr. Nathen May D. Brigitte Pulse  Encounter Date: 03/17/2017      PT End of Session - 03/17/17 1639    Visit Number 4   Date for PT Re-Evaluation 06/17/17   PT Start Time 3817   PT Stop Time 1630   PT Time Calculation (min) 15 min   Activity Tolerance Patient limited by pain      Past Medical History:  Diagnosis Date  . Acid reflux   . Anxiety   . Arthritis   . Asthma    infrequent problems  . Cardiomyopathy ([place])   . Complication of anesthesia    "I quit breathing during surgery" 2008 - has had surgery since with no problems   . Diabetes mellitus   . Endometriosis    hx of   . Headache    hx of migraines  . Hepatitis    hx of dx during ruptured appendix hospitalization  . Hyperlipemia   . Hypertension   . Irritable bowel syndrome   . Obesity   . Pneumonia 12/2013  . PONV (postoperative nausea and vomiting)   . Rash    sides of neck  . Stress incontinence   . Tuberculosis 1985    Past Surgical History:  Procedure Laterality Date  . ABDOMINAL ADHESION SURGERY     x 7  . ABDOMINAL HYSTERECTOMY    . APPENDECTOMY    . CHOLECYSTECTOMY    . COLON SURGERY  2010   colon resection due to adhesions  . LEFT AND RIGHT HEART CATHETERIZATION WITH CORONARY ANGIOGRAM N/A 06/29/2014   Procedure: LEFT AND RIGHT HEART CATHETERIZATION WITH CORONARY ANGIOGRAM;  Surgeon: Jettie Booze, MD;  Location: [facility] CATH LAB;  Service: Cardiovascular;  Laterality: N/A;  . PARTIAL KNEE ARTHROPLASTY Left 10/27/2013   Procedure: LEFT KNEE UNICOMPARTMENTAL REPLACEMENT ;  Surgeon: Gearlean Alf, MD;  Location: [facility] ORS;  Service: Orthopedics;  Laterality: Left;  . PARTIAL KNEE ARTHROPLASTY Right 05/21/2014   Procedure: MEDIAL UNICOMPARTMENTAL RIGHT KNEE ARTHROPLASTY;  Surgeon: Gearlean Alf, MD;  Location: [facility] ORS;  Service: Orthopedics;  Laterality: Right;  . ruptured appendix surgery    . TONSILLECTOMY      There were no vitals filed for this visit.      Subjective Assessment - 03/17/17 1618    Subjective I am emptying my bladder better and I have a nice steady stream. Ache when I urinate is better.  I am having difficulty with my constipation. I am using the squatty potty. I am eating bland food.  Patient has not had a full bowel movement in 3 weeks. I have so much constipation putting pressure on my bladder causing me to leak urine.    Patient Stated Goals prevent getting a surgery for a sling   Currently in Pain? Yes   Pain Score 6    Pain Location Back   Pain Orientation Lower   Pain Descriptors / Indicators Pressure   Pain Type Acute pain   Pain Onset More than a month ago   Pain Frequency Constant   Aggravating Factors  no bowel movement   Pain Relieving Factors having a bowel movement   Multiple Pain Sites No  PT Short Term Goals - 03/17/17 1644      PT SHORT TERM GOAL #1   Title independent with initial HEP   Time 4   Period Weeks   Status Achieved     PT SHORT TERM GOAL #2   Title urinary leakage decreased >/= 25%   Time 4   Period Weeks   Status On-going     PT SHORT TERM GOAL #3   Title understand how to toilet correctly and fully empty her bladder   Time 4   Period Weeks   Status Achieved     PT SHORT TERM GOAL #4   Title bladder pain decreased >/= 25% due to improved tissue mobility    Time 4   Period Weeks   Status Achieved           PT Long Term Goals - 03/17/17 1623      PT LONG TERM GOAL #1   Title independent with HEP   Time 4   Period Months   Status On-going     PT LONG TERM GOAL #2   Title abdominal strength inceased >/= 4/5 so she is able to push her bowels out with less  straining   Time 4   Period Months   Status On-going     PT LONG TERM GOAL #3   Title urinary leakage decreased >/= 60% due to increased pelvic floor strength and coordination    Time 4   Period Months   Status On-going     PT LONG TERM GOAL #4   Title fecal leakage decreased >/= 60% due to improve pelvic floor coordination and reduction of abdominal valsalva manuever   Time 4   Period Months   Status On-going     PT LONG TERM GOAL #5   Title abdominal pain decreased >/= 50% due to improved tissue mobility   Time 4   Period Months   Status On-going              Plan - 03/17/17 1644    Clinical Impression Statement Patient came into PT with increased pain in abdomen, pelvic and back.  Patient has not had a full bowel movement in 3 weeks.  She has tried several laxatives and therapist has performed abdominal work to release scar tissue.  Patient is using a squatty potty and correct posture for bowel movement but is not helping her at this time.  Patient is having difficulty with her urinary leakage due to the pressure of the bowels laying on her bladder. Patient will benefit from being further assessed to work on her constipation. Once the constipation has reduced she will have more success with her urinary leakage.    Rehab Potential Excellent   Clinical Impairments Affecting Rehab Potential recurrent UTI's, endometriosis, abdominal surgeries for scar tissue removal, abdominal hysterectomy, colon surgery to resect due to adhesions   PT Frequency 1x / week   PT Duration Other (comment)  4 months   PT Treatment/Interventions Biofeedback;Moist Heat;Ultrasound;Therapeutic activities;Therapeutic exercise;Neuromuscular re-education;Patient/family education;Passive range of motion;Scar mobilization;Manual techniques;Dry needling;Taping   PT Next Visit Plan  soft tissue work; pelvic floor contraction;  abdominal bracing; see what MD says   PT Home Exercise Plan progress as needed    Consulted and Agree with Plan of Care Patient      Patient will benefit from skilled therapeutic intervention in order to improve the following deficits and impairments:  Decreased coordination, Increased fascial restricitons, Increased muscle spasms, Pain, Decreased activity  tolerance, Decreased strength, Decreased scar mobility  Visit Diagnosis: Unspecified lack of coordination  Muscle weakness (generalized)     Problem List Patient Active Problem List   Diagnosis Date Noted  . Allergic reaction 04/21/2015  . Type 2 diabetes, HbA1c goal < 7% (HCC)   . Pyelonephritis 04/20/2015  . Chronic systolic (congestive) heart failure ([place]) 04/20/2015  . Anemia 04/18/2015  . Atherosclerosis of aorta ([place]) 04/18/2015  . Heart failure ([place]) 04/18/2015  . Chronic kidney disease, stage III (moderate) 04/18/2015  . Gastro-esophageal reflux disease without esophagitis 04/18/2015  . Hypertensive heart and chronic kidney disease with heart failure and stage 1 through stage 4 chronic kidney disease, or chronic kidney disease ([place]) 04/18/2015  . Mixed hyperlipidemia 04/18/2015  . Mood disorder ([place]) 04/18/2015  . Secondary pulmonary hypertension 04/18/2015  . History of colonic polyps 04/18/2015  . Type 2 diabetes mellitus with diabetic nephropathy ([place]) 04/18/2015  . 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 Dyspnea   . SOB (shortness of breath)   . Acute CHF ([place]) 06/28/2014  . Acute systolic congestive heart failure ([place])   . OA (osteoarthritis) of knee 10/27/2013  . Dehydration 09/12/2011  . ARF (acute renal failure) ([place]) 09/12/2011  . Diarrhea 09/12/2011  . Nausea and vomiting 09/12/2011  . Diabetes mellitus ([place]) 09/12/2011  . Asthma 09/12/2011  . Hypertension   . Hyperlipemia   . Acid reflux   . Anxiety   . Obesity     GRAY,CHERYL 03/17/2017, 4:46 PM  [facility] [address], [place], [place], [address] Phone:  [phone]   Fax:  [phone]  Name: Shelly [name] MRN: [id number] Date of Birth: [birth year] PHYSICAL THERAPY DISCHARGE SUMMARY  Visits from Start of Care: 4  Current functional level related to goals / functional outcomes: See above.    Remaining deficits: See above.  Unable to assess patient for discharge due to her not returning to therapy.    Education / Equipment: HEP Plan:                                                    Patient goals were not met. Patient is being discharged due to not returning since the last visit.  Thank you for the referral. Earlie Counts, PT 06/16/17 4:49 PM  ?????

## 2017-03-24 ENCOUNTER — Encounter: Payer: 59 | Admitting: Physical Therapy

## 2017-03-31 ENCOUNTER — Encounter: Payer: 59 | Admitting: Physical Therapy

## 2017-04-07 ENCOUNTER — Encounter: Payer: 59 | Admitting: Physical Therapy

## 2017-05-10 DIAGNOSIS — J019 Acute sinusitis, unspecified: Secondary | ICD-10-CM | POA: Diagnosis not present

## 2017-05-25 DIAGNOSIS — E1121 Type 2 diabetes mellitus with diabetic nephropathy: Secondary | ICD-10-CM | POA: Diagnosis not present

## 2017-05-25 DIAGNOSIS — Z0000 Encounter for general adult medical examination without abnormal findings: Secondary | ICD-10-CM | POA: Diagnosis not present

## 2017-05-25 DIAGNOSIS — Z23 Encounter for immunization: Secondary | ICD-10-CM | POA: Diagnosis not present

## 2017-06-17 DIAGNOSIS — B961 Klebsiella pneumoniae [K. pneumoniae] as the cause of diseases classified elsewhere: Secondary | ICD-10-CM | POA: Diagnosis not present

## 2017-06-17 DIAGNOSIS — N390 Urinary tract infection, site not specified: Secondary | ICD-10-CM | POA: Diagnosis not present

## 2017-06-17 DIAGNOSIS — N3020 Other chronic cystitis without hematuria: Secondary | ICD-10-CM | POA: Diagnosis not present

## 2017-07-14 DIAGNOSIS — N3020 Other chronic cystitis without hematuria: Secondary | ICD-10-CM | POA: Diagnosis not present

## 2017-09-01 DIAGNOSIS — R319 Hematuria, unspecified: Secondary | ICD-10-CM | POA: Diagnosis not present

## 2017-09-01 DIAGNOSIS — E1121 Type 2 diabetes mellitus with diabetic nephropathy: Secondary | ICD-10-CM | POA: Diagnosis not present

## 2017-09-01 DIAGNOSIS — N183 Chronic kidney disease, stage 3 (moderate): Secondary | ICD-10-CM | POA: Diagnosis not present

## 2017-11-17 IMAGING — CT CT CHEST W/O CM
3 of 4 series · 16 of 30 positions shown, 18 images · non-contrast
Comparison: Chest CT June 28, 2014; chest radiograph September 29, 2016

CLINICAL DATA: Question pulmonary nodular lesions on chest
radiograph

EXAM:
CT CHEST WITHOUT CONTRAST
TECHNIQUE: Multidetector CT imaging of the chest was performed following the
standard protocol without IV contrast.

[Series 3: chest w/o · axial · non-contrast · 0.70mm/px · z∈[-271,-46]mm · 7 of 121 slices shown, 9 images]
[im 16/121  mediastinal]
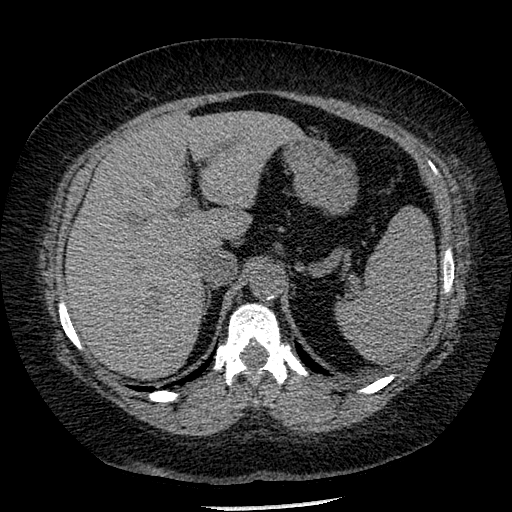
[im 16/121  lung]
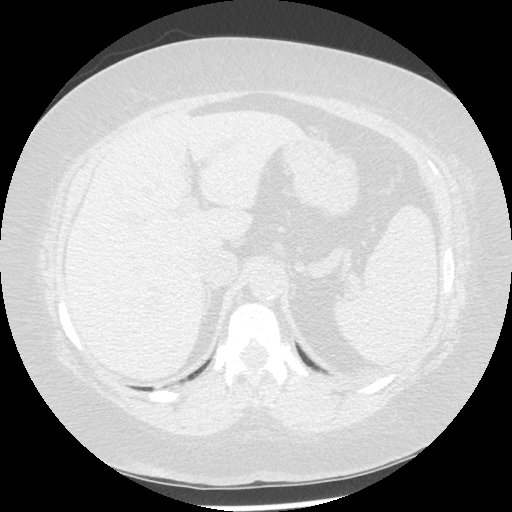
[im 31/121  lung]
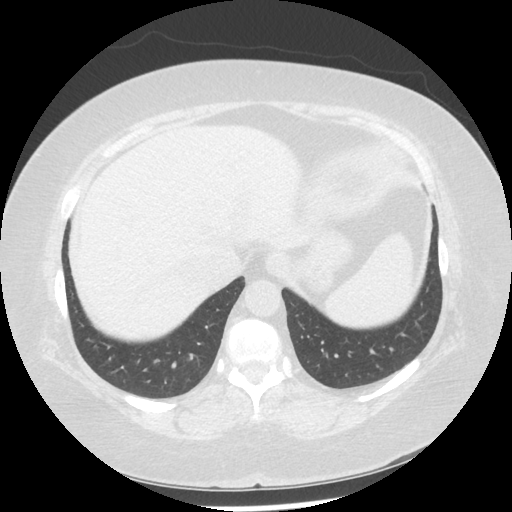
[im 46/121  lung]
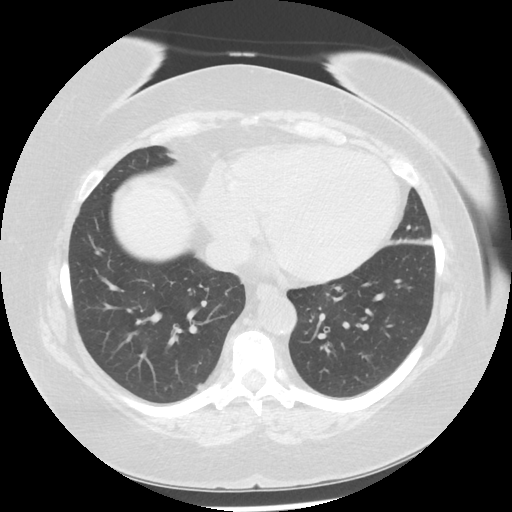
[im 61/121  lung]
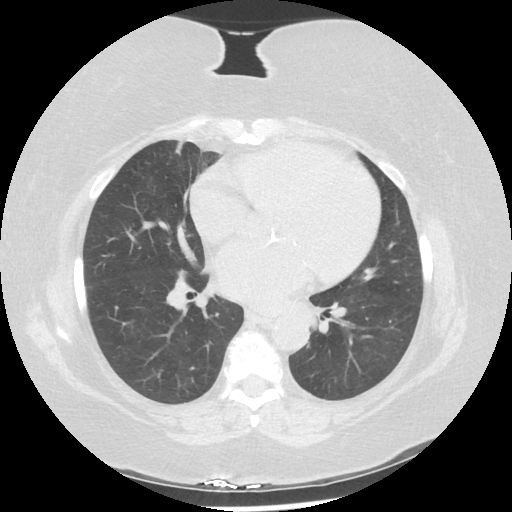
[im 76/121  mediastinal]
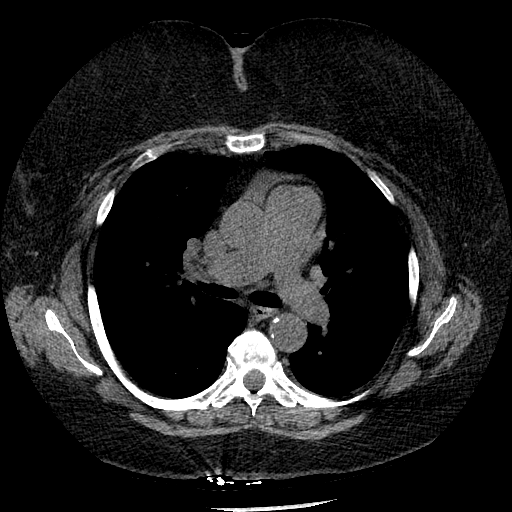
[im 76/121  lung]
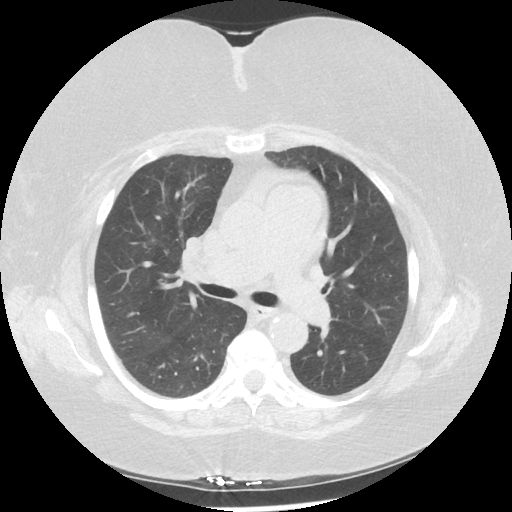
[im 91/121  lung]
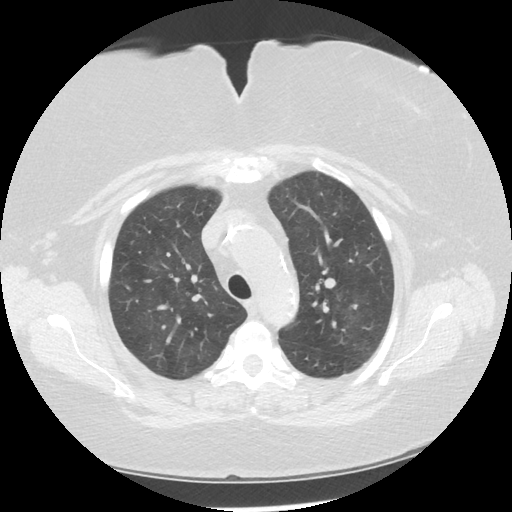
[im 106/121  lung]
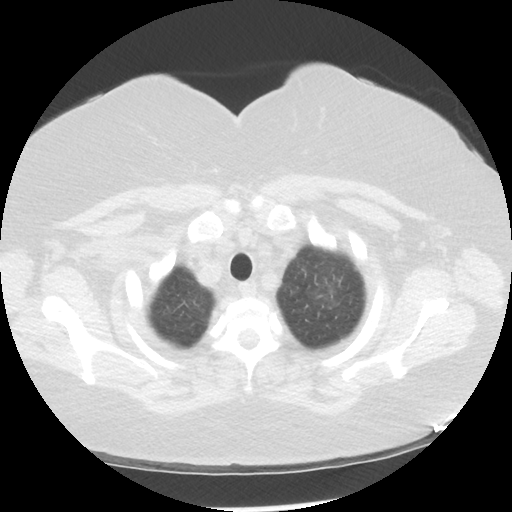

[Series 4: lung windows · axial · 0.70mm/px · z∈[-271,-46]mm · 7 of 121 slices shown]
[im 16/121  lung]
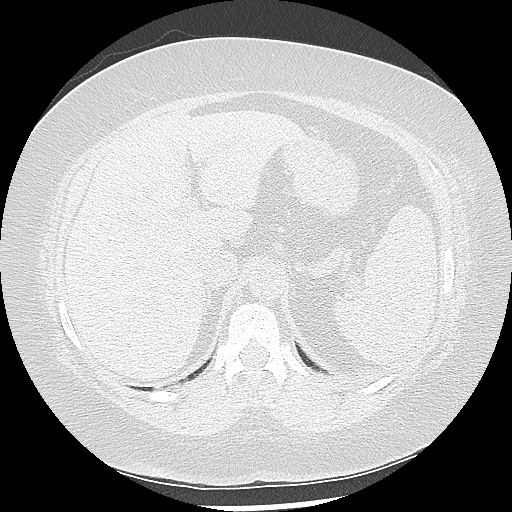
[im 31/121  lung]
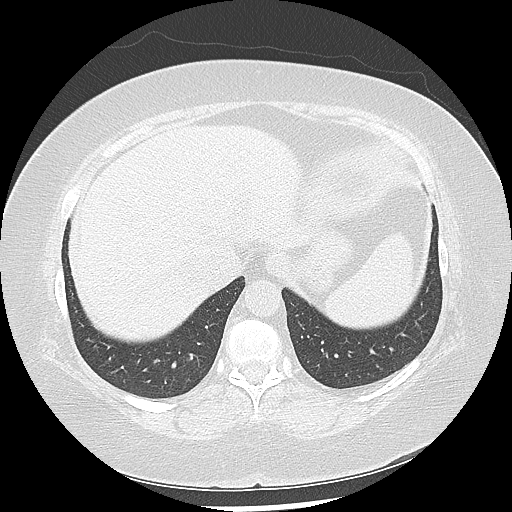
[im 46/121  lung]
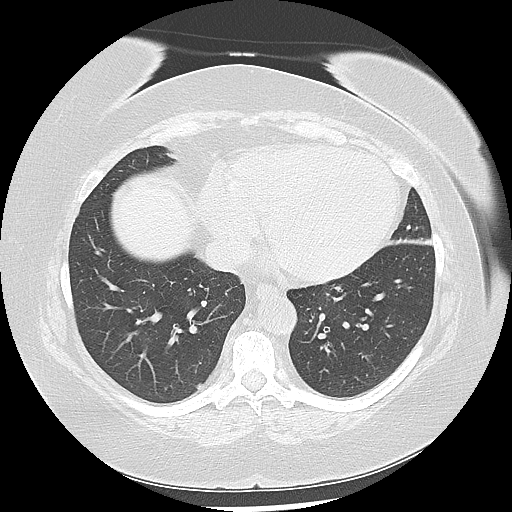
[im 61/121  lung]
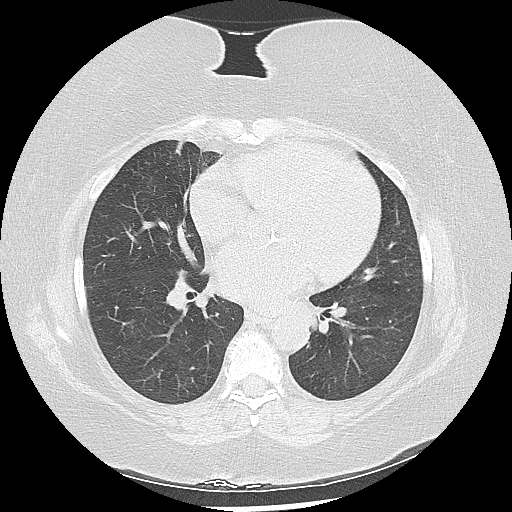
[im 76/121  lung]
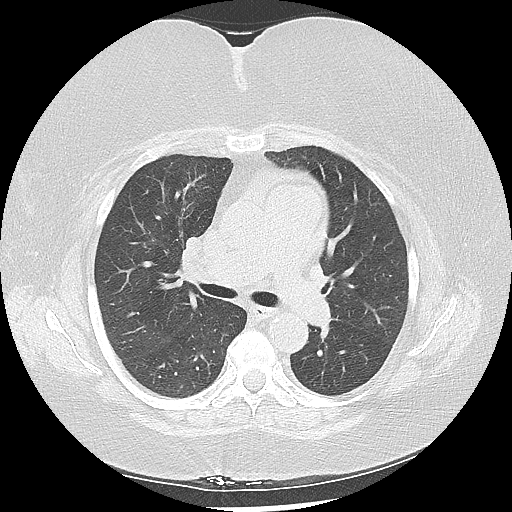
[im 91/121  lung]
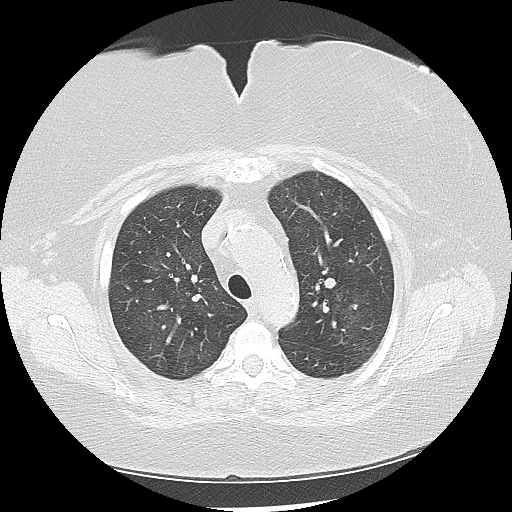
[im 106/121  lung]
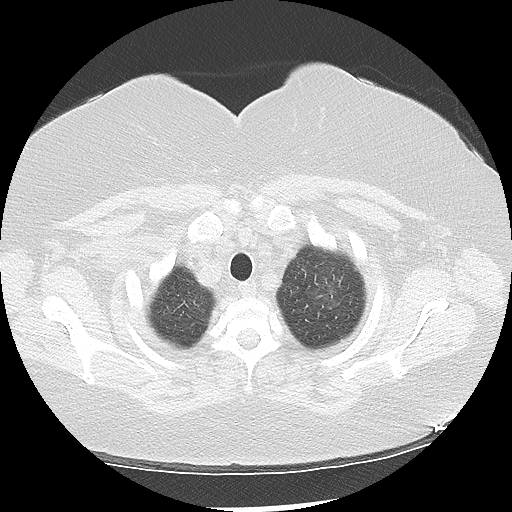

[Series 602: sagittal body · sagittal · 0.70mm/px · 2 of 145 slices shown]
[im 17/145  mediastinal]
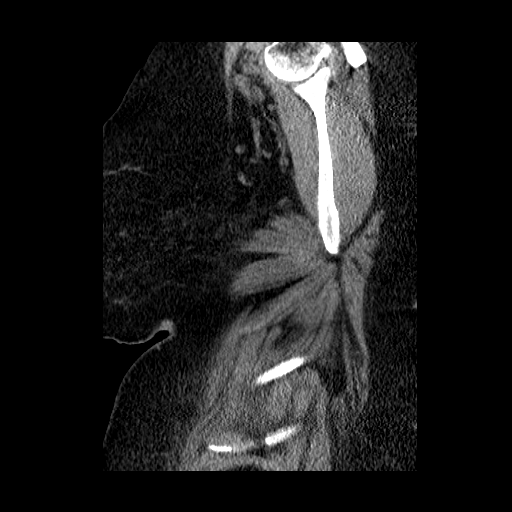
[im 33/145  mediastinal]
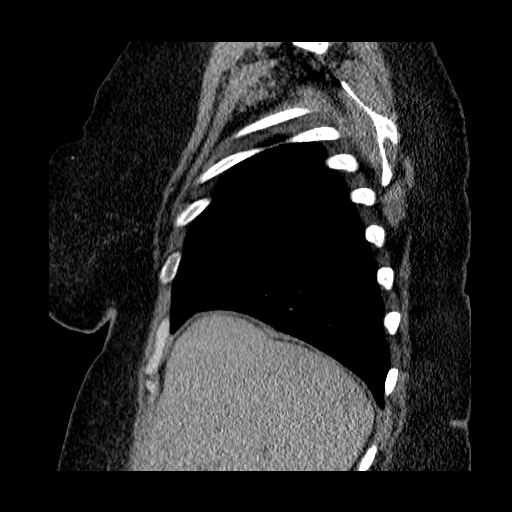

[16 of 30 positions shown; findings below may reference images not displayed]

FINDINGS: Cardiovascular: There is no thoracic aortic aneurysm. Visualized
great vessels appear unremarkable on this noncontrast enhanced
study. There are scattered foci of atherosclerotic calcification in
the aorta. There are foci of coronary artery calcification. There is
a small pericardial effusion inferiorly.

Mediastinum/Nodes: There are subcentimeter nodular opacities in the
thyroid consistent with multinodular goiter. There are a few
subcentimeter mediastinal lymph nodes bilaterally. There is no
adenopathy by size criteria.

Lungs/Pleura: There is mild scarring in the anterior left and
anterior right base regions. No edema or consolidation. There are no
evident pulmonary nodular lesions which correlate with opacities
questioned on recent chest radiograph. On axial slice 76 series 4,
there is a 3 mm nodular opacity abutting the pleura in the superior
segment of the right lower lobe. There is no pleural effusion or
pleural thickening.

Upper Abdomen: In the visualized upper abdomen, there is
atherosclerotic calcification in the aorta. Gallbladder is absent.
Visualized upper abdominal structures otherwise appear unremarkable.

Musculoskeletal: There is degenerative change in the thoracic spine.
No blastic or lytic bone lesions.
IMPRESSION: The nodular areas questioned on recent chest radiograph appear to
represent vessels on end. There is a 3 mm nodular opacity abutting
the pleura in the superior segment of the right lower lobe. No
follow-up needed if patient is low-risk. Non-contrast chest CT can
be considered in 12 months if patient is high-risk. This
recommendation follows the consensus statement: Guidelines for
Management of Incidental Pulmonary Nodules Detected on CT Images:
of mild scarring noted bilaterally. No edema or consolidation.

Multinodular goiter without dominant mass. There are a few
subcentimeter mediastinal lymph nodes without adenopathy by size
criteria.

There are foci of atherosclerotic calcification in the aorta. There
are foci of coronary artery calcification. There is a small
pericardial effusion of uncertain etiology.

## 2017-12-01 ENCOUNTER — Encounter: Payer: Self-pay | Admitting: Gastroenterology

## 2017-12-01 DIAGNOSIS — R809 Proteinuria, unspecified: Secondary | ICD-10-CM | POA: Diagnosis not present

## 2017-12-01 DIAGNOSIS — D649 Anemia, unspecified: Secondary | ICD-10-CM | POA: Diagnosis not present

## 2017-12-01 DIAGNOSIS — R1032 Left lower quadrant pain: Secondary | ICD-10-CM | POA: Diagnosis not present

## 2018-01-06 ENCOUNTER — Ambulatory Visit (INDEPENDENT_AMBULATORY_CARE_PROVIDER_SITE_OTHER): Payer: 59 | Admitting: Gastroenterology

## 2018-01-06 ENCOUNTER — Other Ambulatory Visit (INDEPENDENT_AMBULATORY_CARE_PROVIDER_SITE_OTHER): Payer: 59

## 2018-01-06 ENCOUNTER — Encounter: Payer: Self-pay | Admitting: Gastroenterology

## 2018-01-06 VITALS — BP 148/88 | HR 90 | Ht 62.0 in | Wt 235.2 lb

## 2018-01-06 DIAGNOSIS — R197 Diarrhea, unspecified: Secondary | ICD-10-CM

## 2018-01-06 DIAGNOSIS — K589 Irritable bowel syndrome without diarrhea: Secondary | ICD-10-CM | POA: Diagnosis not present

## 2018-01-06 DIAGNOSIS — R1030 Lower abdominal pain, unspecified: Secondary | ICD-10-CM

## 2018-01-06 LAB — C-REACTIVE PROTEIN: CRP: 3.7 mg/dL (ref 0.5–20.0)

## 2018-01-06 LAB — COMPREHENSIVE METABOLIC PANEL
ALBUMIN: 3.8 g/dL (ref 3.5–5.2)
ALK PHOS: 83 U/L (ref 39–117)
ALT: 9 U/L (ref 0–35)
AST: 11 U/L (ref 0–37)
BUN: 14 mg/dL (ref 6–23)
CALCIUM: 9.1 mg/dL (ref 8.4–10.5)
CHLORIDE: 99 meq/L (ref 96–112)
CO2: 33 mEq/L — ABNORMAL HIGH (ref 19–32)
Creatinine, Ser: 1.17 mg/dL (ref 0.40–1.20)
GFR: 49.8 mL/min — AB (ref >60.00)
Glucose, Bld: 177 mg/dL — ABNORMAL HIGH (ref 70–99)
POTASSIUM: 4 meq/L (ref 3.5–5.1)
Sodium: 138 mEq/L (ref 135–145)
Total Bilirubin: 0.5 mg/dL (ref 0.2–1.2)
Total Protein: 7.2 g/dL (ref 6.0–8.3)

## 2018-01-06 LAB — CBC WITH DIFFERENTIAL/PLATELET
BASOS ABS: 0.1 10*3/uL (ref 0.0–0.1)
Basophils Relative: 1 % (ref 0.0–3.0)
EOS ABS: 0.3 10*3/uL (ref 0.0–0.7)
Eosinophils Relative: 2.8 % (ref 0.0–5.0)
HEMATOCRIT: 31.2 % — AB (ref 36.0–46.0)
HEMOGLOBIN: 10.4 g/dL — AB (ref 12.0–15.0)
LYMPHS PCT: 20.1 % (ref 12.0–46.0)
Lymphs Abs: 2.3 10*3/uL (ref 0.7–4.0)
MCHC: 33.5 g/dL (ref 30.0–36.0)
MCV: 86.5 fl (ref 78.0–100.0)
MONO ABS: 0.8 10*3/uL (ref 0.1–1.0)
Monocytes Relative: 6.8 % (ref 3.0–12.0)
Neutro Abs: 7.9 10*3/uL — ABNORMAL HIGH (ref 1.4–7.7)
Neutrophils Relative %: 69.3 % (ref 43.0–77.0)
Platelets: 347 10*3/uL (ref 150.0–400.0)
RBC: 3.6 Mil/uL — AB (ref 3.87–5.11)
RDW: 14.6 % (ref 11.5–15.5)
WBC: 11.4 10*3/uL — AB (ref 4.0–10.5)

## 2018-01-06 LAB — TSH: TSH: 1.11 u[IU]/mL (ref 0.35–4.50)

## 2018-01-06 MED ORDER — CIPROFLOXACIN HCL 500 MG PO TABS: 500.0000 mg | ORAL_TABLET | Freq: Three times a day (TID) | ORAL | 0 refills | Status: DC

## 2018-01-06 MED ORDER — CIPROFLOXACIN HCL 500 MG PO TABS: 500.0000 mg | ORAL_TABLET | Freq: Two times a day (BID) | ORAL | 0 refills | Status: DC

## 2018-01-06 MED ORDER — METRONIDAZOLE 500 MG PO TABS: 500.0000 mg | ORAL_TABLET | Freq: Two times a day (BID) | ORAL | 0 refills | Status: DC

## 2018-01-06 NOTE — Patient Instructions (Addendum)
If you are age 62 or older, your body mass index should be between 23-30. Your Body mass index is 43.03 kg/m. If this is out of the aforementioned range listed, please consider follow up with your Primary Care Provider.  If you are age 58 or younger, your body mass index should be between 19-25. Your Body mass index is 43.03 kg/m. If this is out of the aformentioned range listed, please consider follow up with your Primary Care Provider.   We have sent the following medications to your pharmacy for you to pick up at your convenience: Flagyl 500 mg twice daily x 1 week Cipro 500 mg three times daily x 1 week.   You have been scheduled for a CT scan of the abdomen and pelvis at [facility] are scheduled on 01/12/18 at 10:30am. You should arrive 15 minutes prior to your appointment time for registration. Please follow the written instructions below on the day of your exam:  WARNING: IF YOU ARE ALLERGIC TO IODINE/X-RAY DYE, PLEASE NOTIFY RADIOLOGY IMMEDIATELY AT [phone]! YOU WILL BE GIVEN A 13 HOUR PREMEDICATION PREP.  1) Do not eat or drink anything after 6:30am (4 hours prior to your test) 2) You have been given 2 bottles of oral contrast to drink. The solution may taste better if refrigerated, but do NOT add ice or any other liquid to this solution. Shake well before drinking.    Drink 1 bottle of contrast @ 8:30am (2 hours prior to your exam)  Drink 1 bottle of contrast @ 9:30am  (1 hour prior to your exam)  You may take any medications as prescribed with a small amount of water except for the following: Metformin, Glucophage, Glucovance, Avandamet, Riomet, Fortamet, Actoplus Met, Janumet, Glumetza or Metaglip. The above medications must be held the day of the exam AND 48 hours after the exam.  The purpose of you drinking the oral contrast is to aid in the visualization of your intestinal tract. The contrast solution may cause some diarrhea. Before your exam is started, you  will be given a small amount of fluid to drink. Depending on your individual set of symptoms, you may also receive an intravenous injection of x-ray contrast/dye. Plan on being at [facility] for 30 minutes or longer, depending on the type of exam you are having performed.  This test typically takes 30-45 minutes to complete.  If you have any questions regarding your exam or if you need to reschedule, you may call the CT department at [phone] between the hours of 8:00 am and 5:00 pm, Monday-Friday.  ________________________________________________________________________  Please stop at the lab before you leave the office today.   Please call Dr. Leland Her nurse Leeanne Rio, RN)  in 4 weeks at [phone]  to let her now how you are doing.    Gluten-Free Diet for Celiac Disease, Adult The gluten-free diet includes all foods that do not contain gluten. Gluten is a protein that is found in wheat, rye, barley, and some other grains. Following the gluten-free diet is the only treatment for people with celiac disease. It helps to prevent damage to the intestines and improves or eliminates the symptoms of celiac disease. Following the gluten-free diet requires some planning. It can be challenging at first, but it gets easier with time and practice. There are more gluten-free options available today than ever before. If you need help finding gluten-free foods or if you have questions, talk with your diet and nutrition specialist (registered  dietitian) or your health care provider. What do I need to know about a gluten-free diet?  All fruits, vegetables, and meats are safe to eat and do not contain gluten.  When grocery shopping, start by shopping in the produce, meat, and dairy sections. These sections are more likely to contain gluten-free foods. Then move to the aisles that contain packaged foods if you need to.  Read all food labels. Gluten is often added to foods. Always check the  ingredient list and look for warnings, such as "may contain gluten."  Talk with your dietitian or health care provider before taking a gluten-free multivitamin or mineral supplement.  Be aware of gluten-free foods having contact with foods that contain gluten (cross-contamination). This can happen at home and with any processed foods. ? Talk with your health care provider or dietitian about how to reduce the risk of cross-contamination in your home. ? If you have questions about how a food is processed, ask the manufacturer. What key words help to identify gluten? Foods that list any of these key words on the label usually contain gluten:  Wheat, flour, enriched flour, bromated flour, white flour, durum flour, graham flour, phosphated flour, self-rising flour, semolina, farina, barley (malt), rye, and oats.  Starch, dextrin, modified food starch, or cereal.  Thickening, fillers, or emulsifiers.  Malt flavoring, malt extract, or malt syrup.  Hydrolyzed vegetable protein.  In the [place] packaged foods that are gluten-free are required to be labeled "GF." These foods should be easy to identify and are safe to eat. In the [place] food companies are also required to list common food allergens, including wheat, on their labels. Recommended foods Grains  Amaranth, bean flours, 100% buckwheat flour, corn, millet, nut flours or nut meals, GF oats, quinoa, rice, sorghum, teff, rice wafers, pure cornmeal tortillas, popcorn, and hot cereals made from cornmeal. Hominy, rice, wild rice. Some Asian rice noodles or bean noodles. Arrowroot starch, corn bran, corn flour, corn germ, cornmeal, corn starch, potato flour, potato starch flour, and rice bran. Plain, brown, and sweet rice flours. Rice polish, soy flour, and tapioca starch. Vegetables  All plain fresh, frozen, and canned vegetables. Fruits  All plain fresh, frozen, canned, and dried fruits, and 100% fruit juices. Meats and other protein  foods  All fresh beef, pork, poultry, fish, seafood, and eggs. Fish canned in water, oil, brine, or vegetable broth. Plain nuts and seeds, peanut butter. Some lunch meat and some frankfurters. Dried beans, dried peas, and lentils. Dairy  Fresh plain, dry, evaporated, or condensed milk. Cream, butter, sour cream, whipping cream, and most yogurts. Unprocessed cheese, most processed cheeses, some cottage cheese, some cream cheeses. Beverages  Coffee, tea, most herbal teas. Carbonated beverages and some root beers. Wine, sake, and distilled spirits, such as gin, vodka, and whiskey. Most hard ciders. Fats and oils  Butter, margarine, vegetable oil, hydrogenated butter, olive oil, shortening, lard, cream, and some mayonnaise. Some commercial salad dressings. Olives. Sweets and desserts  Sugar, honey, some syrups, molasses, jelly, and jam. Plain hard candy, marshmallows, and gumdrops. Pure cocoa powder. Plain chocolate. Custard and some pudding mixes. Gelatin desserts, sorbets, frozen ice pops, and sherbet. Cake, cookies, and other desserts prepared with allowed flours. Some commercial ice creams. Cornstarch, tapioca, and rice puddings. Seasoning and other foods  Some canned or frozen soups. Monosodium glutamate (MSG). Cider, rice, and wine vinegar. Baking soda and baking powder. Cream of tartar. Baking and nutritional yeast. Certain soy sauces made without wheat (ask your dietitian about  specific brands that are allowed). Nuts, coconut, and chocolate. Salt, pepper, herbs, spices, flavoring extracts, imitation or artificial flavorings, natural flavorings, and food colorings. Some medicines and supplements. Some lip glosses and other cosmetics. Rice syrups. The items listed may not be a complete list. Talk with your dietitian about what dietary choices are best for you. Foods to avoid Grains  Barley, bran, bulgur, couscous, cracked wheat, [place], farro, graham, malt, matzo, semolina, wheat germ, and  all wheat and rye cereals including spelt and kamut. Cereals containing malt as a flavoring, such as rice cereal. Noodles, spaghetti, macaroni, most packaged rice mixes, and all mixes containing wheat, rye, barley, or triticale. Vegetables  Most creamed vegetables and most vegetables canned in sauces. Some commercially prepared vegetables and salads. Fruits  Thickened or prepared fruits and some pie fillings. Some fruit snacks and fruit roll-ups. Meats and other protein foods  Any meat or meat alternative containing wheat, rye, barley, or gluten stabilizers. These are often marinated or packaged meats and lunch meats. Bread-containing products, such as Swiss steak, croquettes, meatballs, and meatloaf. Most tuna canned in vegetable broth and [place] with hydrolyzed vegetable protein (HVP) injected as part of the basting. Seitan. Imitation fish. Eggs in sauces made from ingredients to avoid. Dairy  Commercial chocolate milk drinks and malted milk. Some non-dairy creamers. Any cheese product containing ingredients to avoid. Beverages  Certain cereal beverages. Beer, ale, malted milk, and some root beers. Some hard ciders. Some instant flavored coffees. Some herbal teas made with barley or with barley malt added. Fats and oils  Some commercial salad dressings. Sour cream containing modified food starch. Sweets and desserts  Some toffees. Chocolate-coated nuts (may be rolled in wheat flour) and some commercial candies and candy bars. Most cakes, cookies, donuts, pastries, and other baked goods. Some commercial ice cream. Ice cream cones. Commercially prepared mixes for cakes, cookies, and other desserts. Bread pudding and other puddings thickened with flour. Products containing brown rice syrup made with barley malt enzyme. Desserts and sweets made with malt flavoring. Seasoning and other foods  Some curry powders, some dry seasoning mixes, some gravy extracts, some meat sauces, some ketchups, some  prepared mustards, and horseradish. Certain soy sauces. Malt vinegar. Bouillon and bouillon cubes that contain HVP. Some chip dips, and some chewing gum. Yeast extract. Brewer's yeast. Caramel color. Some medicines and supplements. Some lip glosses and other cosmetics. The items listed may not be a complete list. Talk with your dietitian about what dietary choices are best for you. Summary  Gluten is a protein that is found in wheat, rye, barley, and some other grains. The gluten-free diet includes all foods that do not contain gluten.  If you need help finding gluten-free foods or if you have questions, talk with your diet and nutrition specialist (registered dietitian) or your health care provider.  Read all food labels. Gluten is often added to foods. Always check the ingredient list and look for warnings, such as "may contain gluten." This information is not intended to replace advice given to you by your health care provider. Make sure you discuss any questions you have with your health care provider. Document Released: 06/29/2005 Document Revised: 04/13/2016 Document Reviewed: 04/13/2016 Elsevier Interactive Patient Education  2018 [employer].   Thank you,  Dr. Jackquline Denmark      .

## 2018-01-06 NOTE — Progress Notes (Signed)
Chief Complaint: Acute on chronic abdominal pain  Referring Provider:  Lupita Raider, MD      ASSESSMENT AND PLAN;   #1. Chronic abdominal pain with history of multiple abdominal surgeries due to adhesions, partial colectomy.  History of diverticulosis.  Now with acute on chronic abdominal pain.  Multiple potential etiologies of abdominal pain including abdominal wall pain (could be associated with fibromyalgia).  Need to rule out any intra-abdominal pathology especially given extensive multiple surgeries. Neg EGD and colonoscopy as below.  #2. IBS with alt diarrhea and constipation.  Plan: - Check CBC, CMP, celiac, TSH, CRP, and sed rate. - Check GI pathogen, C. Diff and fecal elastase. - Proceed with CT scan of the abdomen and pelvis -Cipro 500 mg p.o. twice daily and Flagyl 500 mg p.o. 3 times daily for 7 days at patient's request.  This is also being done as an empiric treatment for diverticulitis. - Trial of gluten free diet.  Information was given. - If she continues to have problems, will give her a trial of Bentyl and will obtain H2 breath test to rule out bacterial overgrowth.  Of note that the patient is currently being treated with antibiotics. We would like to hold off on endoscopic procedures since these were recently performed. - Pt to call us in 4 weeks. If her abdominal pain gets significantly worse, then she would go to the emergency room. - FU in 3 months.    HPI:    Shelly [name] is a 62 y.o. female  Abdominal pain - lower pelvic area with radiation to back.  Getting worse over the last few days.  No fever or chills.  She would like to have antibiotics as these are the only ones which make her feel better. 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 Had long standing endometriosis with extensive adhesions. Has required multiple abdominal surgeries.  - has chronic UTIs - negative CT with IV contrast  09/2016 - EGD and colon 2015 with Dr Dulce Sellar, negative except mild diverticulosis. Dx with IBS with alt diarrhea and constipation. Now with occasional diarrhea at the frequency of 1-2 looser bowel movements for a few days and then gets constipated.  Has history of questionable C. difficile in the past. - Has been followed by Dr Berneice Heinrich [facility] urology)- has bladder spasms.  - Very complicated GI history and has seen multiple gastroenterologists at multiple practices. Has had several EGDs and colonoscopies. -Denies having any nausea/vomiting, heartburn, regurgitation, odynophagia or dysphagia.  No recent weight loss.  In fact she has gained weight.   Past Medical History:  Diagnosis Date  . Acid reflux   . Anxiety   . Arthritis   . Asthma    infrequent problems  . Cardiomyopathy (HCC)   . Complication of anesthesia    "I quit breathing during surgery" 2008 - has had surgery since with no problems   . Diabetes mellitus   . Endometriosis    hx of   . Headache    hx of migraines  . Hepatitis    hx of dx during ruptured appendix hospitalization  . Hyperlipemia   . Hypertension   . Irritable bowel syndrome   . Obesity   . Pneumonia 12/2013  . PONV (postoperative nausea and vomiting)   . Rash    sides of neck  . Stress incontinence   . Tuberculosis 1985    Past Surgical History:  Procedure Laterality Date  .  ABDOMINAL ADHESION SURGERY     x 7  . ABDOMINAL HYSTERECTOMY    . APPENDECTOMY    . CHOLECYSTECTOMY    . COLON SURGERY  2010   colon resection due to adhesions  . LEFT AND RIGHT HEART CATHETERIZATION WITH CORONARY ANGIOGRAM N/A 06/29/2014   Procedure: LEFT AND RIGHT HEART CATHETERIZATION WITH CORONARY ANGIOGRAM;  Surgeon: Corky Crafts, MD;  Location: [facility] CATH LAB;  Service: Cardiovascular;  Laterality: N/A;  . PARTIAL KNEE ARTHROPLASTY Left 10/27/2013   Procedure: LEFT KNEE UNICOMPARTMENTAL REPLACEMENT ;  Surgeon: Loanne Drilling, MD;  Location: [facility] ORS;  Service:  Orthopedics;  Laterality: Left;  . PARTIAL KNEE ARTHROPLASTY Right 05/21/2014   Procedure: MEDIAL UNICOMPARTMENTAL RIGHT KNEE ARTHROPLASTY;  Surgeon: Loanne Drilling, MD;  Location: [facility] ORS;  Service: Orthopedics;  Laterality: Right;  . ruptured appendix surgery    . TONSILLECTOMY      Family History  Problem Relation Age of Onset  . Heart failure Mother   . Heart disease Father   . Diabetes Father   . Liver cancer Brother     Social History   Tobacco Use  . Smoking status: Never Smoker  . Smokeless tobacco: Never Used  Substance Use Topics  . Alcohol use: Yes    Comment: occ  . Drug use: No    Current Outpatient Medications  Medication Sig Dispense Refill  . ALPRAZolam (XANAX) 0.5 MG tablet Take 0.5 mg by mouth 3 (three) times daily as needed. Anxiety    . carvedilol (COREG) 25 MG tablet Take 1 tablet by mouth daily at 8 pm.     . ciprofloxacin (CIPRO) 250 MG tablet Take 250 mg by mouth 2 (two) times daily.    . cloNIDine (CATAPRES) 0.1 MG tablet Take 0.1 mg by mouth daily.    . COMBIVENT RESPIMAT 20-100 MCG/ACT AERS respimat Take 1 puff by mouth daily.    . fluticasone (FLONASE) 50 MCG/ACT nasal spray Place 1 spray into both nostrils daily as needed. For allergy nasal congestion    . furosemide (LASIX) 40 MG tablet TAKE ONE TABLET BY MOUTH TWICE DAILY (Patient taking differently: take 1 tablet by mouth once daily) 40 tablet 0  . Insulin Glargine (TOUJEO SOLOSTAR [place]) Inject 40-80 Units into the skin daily.     . nitrofurantoin (MACRODANTIN) 100 MG capsule Take 100 mg by mouth 3 (three) times daily.    . promethazine (PHENERGAN) 25 MG tablet Take 1 tablet (25 mg total) by mouth daily as needed. 30 tablet 0   No current facility-administered medications for this visit.     Allergies  Allergen Reactions  . Amlodipine Besylate     Other reaction(s): fatgiue and nausea  . Atorvastatin     Other reaction(s): strange feelings  . Lisinopril Cough  . Metformin Diarrhea  .  Penicillins Other (See Comments)    Other reaction(s): rash Unknown reaction.   . Sulfa Antibiotics Nausea And Vomiting  . Welchol  [Colesevelam Hcl]     Other reaction(s): constipation  . Dilaudid [Hydromorphone Hcl] Anxiety and Other (See Comments)    paranoia  . Sulfamethoxazole-Trimethoprim Rash    Review of Systems:  Constitutional: Denies fever, chills, diaphoresis, appetite change and fatigue.  HEENT: Denies photophobia, eye pain, redness, hearing loss, ear pain, congestion, sore throat, rhinorrhea, sneezing, mouth sores, neck pain, neck stiffness and tinnitus.   Respiratory: Denies SOB, DOE, cough, chest tightness,  and wheezing.   Cardiovascular: Denies chest pain, palpitations and leg swelling.  Genitourinary:  Has polyuria, urine leakage and has blood in the urine.  Had multiple cystoscopies. Musculoskeletal: Has arthritis, has headaches. Skin: No rash.  Neurological: Denies dizziness, seizures, syncope, weakness, light-headedness, numbness and headaches.  Hematological: Denies adenopathy. Easy bruising, personal or family bleeding history  Psychiatric/Behavioral: Has anxiety or depression     Physical Exam:    BP (!) 148/88   Pulse 90   Ht 5\' 2"  (1.575 m)   Wt 235 lb 4 oz (106.7 kg)   BMI 43.03 kg/m  Filed Weights   01/06/18 1054  Weight: 235 lb 4 oz (106.7 kg)   Constitutional:  Well-developed, in no acute distress. Psychiatric: Normal mood and affect. Behavior is normal. HEENT: Pupils normal.  Conjunctivae are normal. No scleral icterus. Neck supple.  Cardiovascular: Normal rate, regular rhythm. No edema Pulmonary/chest: Effort normal and breath sounds normal. No wheezing, rales or rhonchi. Abdominal: Soft, nondistended.  Generalized abdominal wall tenderness.  No rebound.  Bowel sounds active throughout. There are no masses palpable. No hepatomegaly. Rectal:  defered Neurological: Alert and oriented to person place and time. Skin: Skin is warm and dry. No  rashes noted.  Data Reviewed: I have personally reviewed following labs and imaging studies  CBC: CBC Latest Ref Rng & Units 09/20/2016 04/21/2015 04/20/2015  WBC 4.0 - 10.5 K/uL 19.0(H) 12.6(H) 16.1(H)  Hemoglobin 12.0 - 15.0 g/dL 8.1(X) 9.1(Y) 10.9(L)  Hematocrit 36.0 - 46.0 % 27.7(L) 26.6(L) 33.2(L)  Platelets 150 - 400 K/uL 382 279 387    CMP: CMP Latest Ref Rng & Units 09/20/2016 04/21/2015 04/20/2015  Glucose 65 - 99 mg/dL 782(N) 562(Z) 308(M)  BUN 6 - 20 mg/dL 18 19 20   Creatinine 0.44 - 1.00 mg/dL 5.78(I) 6.96(E) 9.52(W)  Sodium 135 - 145 mmol/L 135 138 141  Potassium 3.5 - 5.1 mmol/L 3.8 3.7 4.2  Chloride 101 - 111 mmol/L 103 103 106  CO2 22 - 32 mmol/L 26 26 -  Calcium 8.9 - 10.3 mg/dL 4.1(L) 8.3(L) -  Total Protein 6.5 - 8.1 g/dL 7.8 - -  Total Bilirubin 0.3 - 1.2 mg/dL 0.9 - -  Alkaline Phos 38 - 126 U/L 66 - -  AST 15 - 41 U/L 16 - -  ALT 14 - 54 U/L 11(L) - -   Seen in presence of [employer] CMA.     Edman Circle, MD 01/06/2018, 11:11 AM  Cc: Lupita Raider, MD

## 2018-01-07 LAB — TISSUE TRANSGLUTAMINASE, IGA: (tTG) Ab, IgA: 5 U/mL — ABNORMAL HIGH

## 2018-01-07 LAB — IGG: IGG (IMMUNOGLOBIN G), SERUM: 1392 mg/dL (ref 694–1618)

## 2018-01-12 ENCOUNTER — Ambulatory Visit (HOSPITAL_BASED_OUTPATIENT_CLINIC_OR_DEPARTMENT_OTHER)
Admission: RE | Admit: 2018-01-12 | Discharge: 2018-01-12 | Disposition: A | Discharge status: Home / Self Care | Payer: 59 | Source: Ambulatory Visit | Attending: Gastroenterology | Admitting: Gastroenterology

## 2018-01-12 ENCOUNTER — Encounter (HOSPITAL_BASED_OUTPATIENT_CLINIC_OR_DEPARTMENT_OTHER): Payer: Self-pay

## 2018-01-12 ENCOUNTER — Other Ambulatory Visit: Payer: 59

## 2018-01-12 DIAGNOSIS — R197 Diarrhea, unspecified: Secondary | ICD-10-CM | POA: Insufficient documentation

## 2018-01-12 DIAGNOSIS — I700 Atherosclerosis of aorta: Secondary | ICD-10-CM | POA: Insufficient documentation

## 2018-01-12 DIAGNOSIS — K589 Irritable bowel syndrome without diarrhea: Secondary | ICD-10-CM | POA: Diagnosis not present

## 2018-01-12 DIAGNOSIS — R1030 Lower abdominal pain, unspecified: Secondary | ICD-10-CM | POA: Diagnosis not present

## 2018-01-12 DIAGNOSIS — R109 Unspecified abdominal pain: Secondary | ICD-10-CM | POA: Diagnosis not present

## 2018-01-12 MED ORDER — IOPAMIDOL (ISOVUE-300) INJECTION 61%
100.0000 mL | Freq: Once | INTRAVENOUS | Status: AC | PRN
  Administered 2018-01-12: 100 mL via INTRAVENOUS

## 2018-01-19 LAB — GASTROINTESTINAL PATHOGEN PANEL PCR
C. DIFFICILE TOX A/B, PCR: NOT DETECTED
CAMPYLOBACTER, PCR: NOT DETECTED
Cryptosporidium, PCR: NOT DETECTED
E COLI 0157, PCR: NOT DETECTED
E coli (ETEC) LT/ST PCR: NOT DETECTED
E coli (STEC) stx1/stx2, PCR: NOT DETECTED
GIARDIA LAMBLIA, PCR: NOT DETECTED
Norovirus, PCR: NOT DETECTED
ROTAVIRUS, PCR: NOT DETECTED
SHIGELLA, PCR: NOT DETECTED
Salmonella, PCR: NOT DETECTED

## 2018-01-19 LAB — PANCREATIC ELASTASE, FECAL: PANCREATIC ELASTASE-1, STL: 306 ug/g

## 2018-01-19 LAB — CLOSTRIDIUM DIFFICILE TOXIN B, QUALITATIVE, REAL-TIME PCR: Toxigenic C. Difficile by PCR: NOT DETECTED

## 2018-02-02 DIAGNOSIS — E782 Mixed hyperlipidemia: Secondary | ICD-10-CM | POA: Diagnosis not present

## 2018-02-02 DIAGNOSIS — E1121 Type 2 diabetes mellitus with diabetic nephropathy: Secondary | ICD-10-CM | POA: Diagnosis not present

## 2018-02-02 DIAGNOSIS — J040 Acute laryngitis: Secondary | ICD-10-CM | POA: Diagnosis not present

## 2018-02-02 DIAGNOSIS — N183 Chronic kidney disease, stage 3 (moderate): Secondary | ICD-10-CM | POA: Diagnosis not present

## 2018-04-29 DIAGNOSIS — Z23 Encounter for immunization: Secondary | ICD-10-CM | POA: Diagnosis not present

## 2018-06-15 DIAGNOSIS — E1121 Type 2 diabetes mellitus with diabetic nephropathy: Secondary | ICD-10-CM | POA: Diagnosis not present

## 2018-06-15 DIAGNOSIS — Z0000 Encounter for general adult medical examination without abnormal findings: Secondary | ICD-10-CM | POA: Diagnosis not present

## 2018-08-11 DIAGNOSIS — J209 Acute bronchitis, unspecified: Secondary | ICD-10-CM | POA: Diagnosis not present

## 2018-08-11 DIAGNOSIS — R509 Fever, unspecified: Secondary | ICD-10-CM | POA: Diagnosis not present

## 2018-08-11 DIAGNOSIS — J019 Acute sinusitis, unspecified: Secondary | ICD-10-CM | POA: Diagnosis not present

## 2018-08-15 DIAGNOSIS — J111 Influenza due to unidentified influenza virus with other respiratory manifestations: Secondary | ICD-10-CM | POA: Diagnosis not present

## 2018-09-13 DIAGNOSIS — E1121 Type 2 diabetes mellitus with diabetic nephropathy: Secondary | ICD-10-CM | POA: Diagnosis not present

## 2018-09-13 DIAGNOSIS — E119 Type 2 diabetes mellitus without complications: Secondary | ICD-10-CM | POA: Diagnosis not present

## 2018-09-13 DIAGNOSIS — J45909 Unspecified asthma, uncomplicated: Secondary | ICD-10-CM | POA: Diagnosis not present

## 2018-09-13 DIAGNOSIS — E782 Mixed hyperlipidemia: Secondary | ICD-10-CM | POA: Diagnosis not present

## 2018-09-13 DIAGNOSIS — N183 Chronic kidney disease, stage 3 (moderate): Secondary | ICD-10-CM | POA: Diagnosis not present

## 2018-09-13 DIAGNOSIS — R300 Dysuria: Secondary | ICD-10-CM | POA: Diagnosis not present

## 2018-10-10 DIAGNOSIS — N390 Urinary tract infection, site not specified: Secondary | ICD-10-CM | POA: Diagnosis not present

## 2018-10-27 DIAGNOSIS — N390 Urinary tract infection, site not specified: Secondary | ICD-10-CM | POA: Diagnosis not present

## 2019-06-19 ENCOUNTER — Other Ambulatory Visit: Payer: Self-pay | Admitting: Family Medicine

## 2019-06-20 ENCOUNTER — Other Ambulatory Visit: Payer: Self-pay | Admitting: Family Medicine

## 2019-06-20 DIAGNOSIS — Z1231 Encounter for screening mammogram for malignant neoplasm of breast: Secondary | ICD-10-CM

## 2019-06-22 ENCOUNTER — Other Ambulatory Visit: Payer: Self-pay

## 2019-06-22 ENCOUNTER — Ambulatory Visit
Admission: RE | Admit: 2019-06-22 | Discharge: 2019-06-22 | Disposition: A | Discharge status: Home / Self Care | Payer: 59 | Source: Ambulatory Visit | Attending: Family Medicine | Admitting: Family Medicine

## 2019-06-22 DIAGNOSIS — Z1231 Encounter for screening mammogram for malignant neoplasm of breast: Secondary | ICD-10-CM

## 2019-07-25 ENCOUNTER — Ambulatory Visit
Admission: RE | Admit: 2019-07-25 | Discharge: 2019-07-25 | Disposition: A | Discharge status: Home / Self Care | Payer: 59 | Source: Ambulatory Visit | Attending: Family Medicine | Admitting: Family Medicine

## 2019-07-25 ENCOUNTER — Other Ambulatory Visit: Payer: Self-pay | Admitting: Family Medicine

## 2019-07-25 DIAGNOSIS — R059 Cough, unspecified: Secondary | ICD-10-CM

## 2019-07-25 DIAGNOSIS — R05 Cough: Secondary | ICD-10-CM

## 2019-07-26 ENCOUNTER — Ambulatory Visit (INDEPENDENT_AMBULATORY_CARE_PROVIDER_SITE_OTHER): Payer: 59 | Admitting: Internal Medicine

## 2019-07-26 ENCOUNTER — Other Ambulatory Visit: Payer: Self-pay

## 2019-07-26 ENCOUNTER — Encounter: Payer: Self-pay | Admitting: Internal Medicine

## 2019-07-26 VITALS — BP 134/84 | HR 92 | Ht 62.0 in | Wt 233.6 lb

## 2019-07-26 DIAGNOSIS — I5021 Acute systolic (congestive) heart failure: Secondary | ICD-10-CM

## 2019-07-26 DIAGNOSIS — J4540 Moderate persistent asthma, uncomplicated: Secondary | ICD-10-CM | POA: Diagnosis not present

## 2019-07-26 DIAGNOSIS — U071 COVID-19: Secondary | ICD-10-CM | POA: Diagnosis not present

## 2019-07-26 DIAGNOSIS — Z7951 Long term (current) use of inhaled steroids: Secondary | ICD-10-CM

## 2019-07-26 DIAGNOSIS — J310 Chronic rhinitis: Secondary | ICD-10-CM | POA: Diagnosis not present

## 2019-07-26 MED ORDER — MONTELUKAST SODIUM 10 MG PO TABS: 10.0000 mg | ORAL_TABLET | Freq: Every day | ORAL | 11 refills | Status: DC

## 2019-07-26 MED ORDER — FLUTICASONE-SALMETEROL 250-50 MCG/DOSE IN AEPB: 1.0000 | INHALATION_SPRAY | Freq: Two times a day (BID) | RESPIRATORY_TRACT | 5 refills | Status: DC

## 2019-07-26 MED ORDER — ALBUTEROL SULFATE (2.5 MG/3ML) 0.083% IN NEBU: 2.5000 mg | INHALATION_SOLUTION | Freq: Four times a day (QID) | RESPIRATORY_TRACT | 11 refills | Status: AC | PRN

## 2019-07-26 NOTE — Patient Instructions (Addendum)
The patient should have follow up scheduled with myself in 3 months.   Prior to next visit patient should have a full set of PFTs including: Spirometry with bronchodilator if obstructed FeNO   Stop using Affrin! Makes nasal congestion worse.  Keep taking montelukast and your singulair Start taking flonase twice a day.  We starting advair back for you 1 puff twice a day.  You can stop combivent if it isn't helping.  Keep taking albuterol as needed.   Flonase - 1 spray on each side of your nose twice a day for first week, then 1 spray on each side.   Instructions for use:  If you also use a saline nasal spray or rinse, use that first.  Position the head with the chin slightly tucked. Use the right hand to spray into the left nostril and the right hand to spray into the left nostril.   Point the bottle away from the septum of your nose (cartilage that divides the two sides of your nose).   Hold the nostril closed on the opposite side from where you will spray  Spray once and gently sniff to pull the medicine into the higher parts of your nose.  Don't sniff too hard as the medicine will drain down the back of your throat instead.  Repeat with a second spray on the same side if prescribed.  Repeat on the other side of your nose.    What is a dry powder inhaler (DPI)? Some asthma medications can be taken in the form of a dry powder using a small, hand-held device called a dry powder inhaler (DPI). Dry powder inhalers deliver medication to the lungs as you inhale through the device. The DPI does not contain propellants or any other ingredients. It contains only the medication.     What is the Diskus dry powder inhaler? The Diskus (figure 1) contains 60 doses of medication (Advair, Flovent, or Serevent are the brand names of the medication that may be prescribed for you). It has a dose indicator that counts down the number of doses as you use them. Doses 5 through 0 are in red to  alert you to refill your prescription. When the "0" appears in the dose indicator, throw away the Diskus and begin a new one.  How do I prepare the Diskus?  1. Hold the Diskus in your left hand. Place the thumb of your right hand in the thumb grip. Push your thumb as far away from you as it will go. This action opens the diskus to expose the lever underneath. 2.  Slide the lever away from you until it clicks. This action loads the dose of medication. You will see the dose counter decrease by one.  Inhaling the medication: Once you have your DPI loaded, follow these steps to inhale the medication:  3. Turn your head away from the diskus and breathe out as much air as you comfortably can. 4. Place the Diskus mouthpiece in your mouth and breathe in strong and steady but not too fast, and fill your lungs completely. 5. Hold your breath for up to 10 seconds. 6. Remove the Diskus from your mouth and exhale slowly. 7. Close the Diskus by placing your thumb in the thumb grip and sliding the grip back toward you, as far as it will go. This action resets the inhaler so it is ready to use for the next treatment. If more than one dose is prescribed, repeat steps 1 through 7 for  each dose.  How do I care for my DPI? Keep your dry powder inhaler in a dry place at room temperature. Never place the DPI in water. Never shake or breathe into the DPI. Never use a spacer device with your DPI. When taking dry powder inhalers, you may not taste, smell, or feel the dry powder. This experience may be different from what you are used to. As long as you are following the directions, you will get your full dose of medication. If you are using a corticosteroid medication, rinse your mouth and gargle after using the DPI. Do not swallow.    By learning about asthma and how it can be controlled, you take an important step toward managing this disease. Work closely with your asthma care team to learn all you can about  your asthma, how to avoid triggers, what your medications do, and how to take them correctly. With proper care, you can live free of asthma symptoms and maintain a normal, healthy lifestyle.   What is asthma? Asthma is a chronic disease that affects the airways of the lungs. During normal breathing, the bands of muscle that surround the airways are relaxed and air moves freely. During an asthma episode or "attack," there are three main changes that stop air from moving easily through the airways:  The bands of muscle that surround the airways tighten and make the airways narrow. This tightening is called bronchospasm.   The lining of the airways becomes swollen or inflamed.   The cells that line the airways produce more mucus, which is thicker than normal and clogs the airways.  These three factors - bronchospasm, inflammation, and mucus production - cause symptoms such as difficulty breathing, wheezing, and coughing.  What are the most common symptoms of asthma? Asthma symptoms are not the same for everyone. They can even change from episode to episode in the same person. Also, you may have only one symptom of asthma, such as cough, but another person may have all the symptoms of asthma. It is important to know all the symptoms of asthma and to be aware that your asthma can present in any of these ways at any time. The most common symptoms include: . Coughing, especially at night  . Shortness of breath  . Wheezing  . Chest tightness, pain, or pressure   Who is affected by asthma? Asthma affects 22 million Americans; about 6 million of these are children under age 32. People who have a family history of asthma have an increased risk of developing the disease. Asthma is also more common in people who have allergies or who are exposed to tobacco smoke. However, anyone can develop asthma at any time. Some people may have asthma all of their lives, while others may develop it as adults.  What  causes asthma? The airways in a person with asthma are very sensitive and react to many things, or "triggers." Contact with these triggers causes asthma symptoms. One of the most important parts of asthma control is to identify your triggers and then avoid them when possible. The only trigger you do not want to avoid is exercise. Pre-treatment with medicines before exercise can allow you to stay active yet avoid asthma symptoms. Common asthma triggers include: 1. Infections (colds, viruses, flu, sinus infections)  2. Exercise  3. Weather (changes in temperature and/or humidity, cold air)  4. Tobacco smoke  5. Allergens (dust mites, pollens, pets, mold spores, cockroaches, and sometimes foods)  6. Irritants (strong odors  from cleaning products, perfume, wood smoke, air pollution)  7. Strong emotions such as crying or laughing hard  8. Some medications   How is asthma diagnosed? To diagnose asthma, your doctor will first review your medical history, family history, and symptoms. Your doctor will want to know any past history of breathing problems you may have had, as well as a family history of asthma, allergies, eczema (a bumpy, itchy skin rash caused by allergies), or other lung disease. It is important that you describe your symptoms in detail (cough, wheeze, shortness of breath, chest tightness), including when and how often they occur. The doctor will perform a physical examination and listen to your heart and lungs. He or she may also order breathing tests, allergy tests, blood tests, and chest and sinus X-rays. The tests will find out if you do have asthma and if there are any other conditions that are contributing factors.  How is asthma treated? Asthma can be controlled, but not cured. It is not normal to have frequent symptoms, trouble sleeping, or trouble completing tasks. Appropriate asthma care will prevent symptoms and visits to the emergency room and hospital. Asthma medicines are one  of the mainstays of asthma treatment. The drugs used to treat asthma are explained below.  Anti-inflammatories: These are the most important drugs for most people with asthma. Anti-inflammatory drugs reduce swelling and mucus production in the airways. As a result, airways are less sensitive and less likely to react to triggers. These medications need to be taken daily and may need to be taken for several weeks before they begin to control asthma. Anti-inflammatory medicines lead to fewer symptoms, better airflow, less sensitive airways, less airway damage, and fewer asthma attacks. If taken every day, they CONTROL or prevent asthma symptoms.   Bronchodilators: These drugs relax the muscle bands that tighten around the airways. This action opens the airways, letting more air in and out of the lungs and improving breathing. Bronchodilators also help clear mucus from the lungs. As the airways open, the mucus moves more freely and can be coughed out more easily. In short-acting forms, bronchodilators RELIEVE or stop asthma symptoms by quickly opening the airways and are very helpful during an asthma episode. In long-acting forms, bronchodilators provide CONTROL of asthma symptoms and prevent asthma episodes.  Asthma drugs can be taken in a variety of ways. Inhaling the medications by using a metered dose inhaler, dry powder inhaler, or nebulizer is one way of taking asthma medicines. Oral medicines (pills or liquids you swallow) may also be prescribed.  Asthma severity Asthma is classified as either "intermittent" (comes and goes) or "persistent" (lasting). Persistent asthma is further described as being mild, moderate, or severe. The severity of asthma is based on how often you have symptoms both during the day and night, as well as by the results of lung function tests and by how well you can perform activities. The "severity" of asthma refers to how "intense" or "strong" your asthma is.  Asthma  control Asthma control is the goal of asthma treatment. Regardless of your asthma severity, it may or may not be controlled. Asthma control means: . You are able to do everything you want to do at work and home  . You have no (or minimal) asthma symptoms  . You do not wake up from your sleep or earlier than usual in the morning due to asthma  . You rarely need to use your reliever medicine (inhaler)  Another major part of your  treatment is that you are happy with your asthma care and believe your asthma is controlled.  Monitoring symptoms A key part of treatment is keeping track of how well your lungs are working. Monitoring your symptoms  what they are, how and when they happen, and how severe they are  is an important part of being able to control your asthma.  Sometimes asthma is monitored using a peak flow meter. A peak flow (PF) meter measures how fast the air comes out of your lungs. It can help you know when your asthma is getting worse, sometimes even before you have symptoms. By taking daily peak flow readings, you can learn when to adjust medications to keep asthma under good control. It is also used to create your asthma action plan (see below). Your doctor can use your peak flow readings to adjust your treatment plan in some cases.  Asthma Action Plan Based on your history and asthma severity, you and your doctor will develop a care plan called an "asthma action plan." The asthma action plan describes when and how to use your medicines, actions to take when asthma worsens, and when to seek emergency care. Make sure you understand this plan. If you do not, ask your asthma care provider any questions you may have. Your asthma action plan is one of the keys to controlling asthma. Keep it readily available to remind you of what you need to do every day to control asthma and what you need to do when symptoms occur.  Goals of asthma therapy These are the goals of asthma  treatment: . Live an active, normal life  . Prevent chronic and troublesome symptoms  . Attend work or school every day  . Perform daily activities without difficulty  . Stop urgent visits to the doctor, emergency department, or hospital  . Use and adjust medications to control asthma with few or no side effects

## 2019-07-26 NOTE — Progress Notes (Signed)
Shelly [name]    [id number]    [birth year]  Primary Care Physician:Shaw, Nathen May, MD  Referring Physician: Mayra Neer, MD [facility] [employer] [address] [place],  [place] [address] Reason for Consultation: Shortness of breath Date of Consultation: 07/26/2019  Chief complaint:   Chief Complaint  Patient presents with  . Consult    Referred by Dr. Brigitte Pulse due to SOB. Pt was diagnosed with covid January 2020 and then again November 2020 and has been having problems with her breathing since. Pt also has complaints of coughing with yellow phlegm and occ wheezing.     HPI:  Had a URI in Jan 2020. Had another URI in Nov 2020 and was covid 19 positive. Has been having persistent shortness of breath and cough and chest congestion. She has a histoy of asthma but has been off ICS for a long time.   Current Regimen: combivent and albuterol, singulair Asthma Triggers: cold air, URI  Exacerbations in the last year: 2 History of hospitalization or intubation: None Hives: None Allergy Testing: Has not had GERD: Yes Allergic Rhinitis: Yes, uncontrolled ACT: No flowsheet data found. FeNO:  She takes albuterol nebulizer 4-5 times/day including twice at night. She is using combivent 3 times/day and 2-3 times at night as well.   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  Recurrent pneumonia and bronchitis growing up. Took advair after this and she felt improvement.   Also has history of Extensive abdominal surgeries and endometriosis. S/p colectomy   Social history:  Occupation: Banker company Exposures: lives alone at home.  Smoking history: Never smoker, no significant  passive smoke exposure  Social History   Occupational History  . Not on file  Tobacco Use  . Smoking status: Never Smoker  . Smokeless tobacco: Never Used  Substance and Sexual Activity  . Alcohol use: Yes    Comment: occ  . Drug  use: No  . Sexual activity: Not on file    Relevant family history: Family History  Problem Relation Age of Onset  . Heart failure Mother   . Heart disease Father   . Diabetes Father   . Liver cancer Brother     Past Medical History:  Diagnosis Date  . Acid reflux   . Anemia   . Anxiety   . Arthritis   . Asthma    infrequent problems  . Cardiomyopathy ([place])   . CHF (congestive heart failure) ([place])   . Chronic UTI (urinary tract infection)   . Complication of anesthesia    "I quit breathing during surgery" 2008 - has had surgery since with no problems   . Diabetes mellitus   . Endometriosis    hx of   . Headache    hx of migraines  . Hepatitis B    hx of dx during ruptured appendix hospitalization  . Hyperlipemia   . Hypertension   . IBS (irritable bowel syndrome)   . Irritable bowel syndrome   . Kidney disease   . Obesity   . Pneumonia 12/2013  . Pneumonia   . PONV (postoperative nausea and vomiting)   . Rash    sides of neck  . Stress incontinence   . Tuberculosis 1985    Past Surgical History:  Procedure Laterality Date  . ABDOMINAL ADHESION SURGERY  1989   x 7  . ABDOMINAL HYSTERECTOMY  01/1996  .  APPENDECTOMY  1987  . CHOLECYSTECTOMY  11/24/2000  . COLON SURGERY  2010   colon resection due to adhesions  . LAPAROSCOPIC LYSIS OF ADHESIONS  07/1991   pelvic adhesions  . LEFT AND RIGHT HEART CATHETERIZATION WITH CORONARY ANGIOGRAM N/A 06/29/2014   Procedure: LEFT AND RIGHT HEART CATHETERIZATION WITH CORONARY ANGIOGRAM;  Surgeon: Corky Crafts, MD;  Location: [facility] CATH LAB;  Service: Cardiovascular;  Laterality: N/A;  . OOPHORECTOMY Right 04/1995  . OOPHORECTOMY Left 12/1992   removed left ovary  . PARTIAL KNEE ARTHROPLASTY Left 10/27/2013   Procedure: LEFT KNEE UNICOMPARTMENTAL REPLACEMENT ;  Surgeon: Loanne Drilling, MD;  Location: [facility] ORS;  Service: Orthopedics;  Laterality: Left;  . PARTIAL KNEE ARTHROPLASTY Right 05/21/2014   Procedure: MEDIAL  UNICOMPARTMENTAL RIGHT KNEE ARTHROPLASTY;  Surgeon: Loanne Drilling, MD;  Location: [facility] ORS;  Service: Orthopedics;  Laterality: Right;  . TONSILLECTOMY       Review of systems: Review of Systems  Constitutional: Negative for chills, fever and weight loss.  HENT: Positive for congestion, sinus pain and sore throat.   Eyes: Negative for discharge and redness.  Respiratory: Positive for cough, shortness of breath and wheezing. Negative for hemoptysis and sputum production.   Cardiovascular: Positive for chest pain. Negative for palpitations and leg swelling.  Gastrointestinal: Positive for abdominal pain and heartburn. Negative for nausea and vomiting.  Musculoskeletal: Negative for joint pain and myalgias.  Skin: Negative for rash.  Neurological: Negative for dizziness, tremors, focal weakness and headaches.  Endo/Heme/Allergies: Negative for environmental allergies.  Psychiatric/Behavioral: Negative for depression. The patient is nervous/anxious.   All other systems reviewed and are negative.   Physical Exam: Blood pressure 134/84, pulse 92, height 5\' 2"  (1.575 m), weight 233 lb 9.6 oz (106 kg), SpO2 96 %. Gen:      No acute distress Eyes: EOMI, sclera anicteric ENT:  Mmm, cobblestoning in oropharynx, nasal debris and erythema Neck:     Supple, wheezing auscultated over the neck Lungs:    No increased respiratory effort, symmetric chest wall excursion, clear to auscultation bilaterally, end expiratory wheezes,  CV:         Regular rate and rhythm; no murmurs, rubs, or gallops.  No pedal edema Abd:      + bowel sounds; soft, non-tender; no distension MSK: no acute synovitis of DIP or PIP joints, no mechanics hands.  Skin:      Warm and dry; no rashes Neuro: normal speech, no focal facial asymmetry, hoarse voice.  Psych: alert and oriented x3, anxious  Data Reviewed: Imaging: I have personally reviewed the chest xray on 1/12 with no acute cardiopulmonary process.   PFTs: None on  file  Labs:  Immunization status: Immunization History  Administered Date(s) Administered  . Influenza Split 06/16/2013, 03/13/2014  . Influenza,inj,Quad PF,6+ Mos 05/01/2011, 04/29/2012, 05/15/2019  . Influenza,inj,Quad PF,6-35 Mos 05/30/2010  . Influenza,inj,quad, With Preservative 05/11/2014  . Pneumococcal Polysaccharide-23 08/05/2012  . Td 04/23/1995  . Tdap 01/27/2013   Assessment:  Moderate Persistent Asthma COVID 19 infection Allergic rhinitis  Plan/Recommendations: Obtain spirometry and FeNO prior to next visit   Will resume ICS-LABA.  She has done very well with Advair in the past, and this would be the most medically appropriate medication for her moderate persistent asthma which is not well controlled.  Without this medication she is at high risk for adverse effects including hospitalization and even death.  We will continue as needed albuterol, she can stop Combivent as this serves a duel  purpose and is expensive for her.  Continue Singulair for her rhinitis.  We will also start twice daily Flonase and she should continue Zyrtec as well.  Discussed asthma management and disease progression in detail today.  We also did inhaler teaching today.  I spent 47 minutes on 07/26/2019 in care of this patient including face to face time and non-face to face time spent charting, review of outside records, and coordination of care.   Return to Care: Return in about 3 months (around 10/24/2019) for asthma.  Durel Salts, MD Pulmonary and Critical Care Medicine [facility] Office:[phone]  CC: Lupita Raider, MD

## 2019-07-28 ENCOUNTER — Other Ambulatory Visit: Payer: Self-pay | Admitting: *Deleted

## 2019-07-28 DIAGNOSIS — J4540 Moderate persistent asthma, uncomplicated: Secondary | ICD-10-CM

## 2019-08-03 ENCOUNTER — Other Ambulatory Visit (HOSPITAL_COMMUNITY): Payer: Self-pay | Admitting: Family Medicine

## 2019-08-03 DIAGNOSIS — I509 Heart failure, unspecified: Secondary | ICD-10-CM

## 2019-09-15 ENCOUNTER — Telehealth: Payer: Self-pay | Admitting: Internal Medicine

## 2019-09-15 DIAGNOSIS — J4540 Moderate persistent asthma, uncomplicated: Secondary | ICD-10-CM

## 2019-09-15 DIAGNOSIS — U071 COVID-19: Secondary | ICD-10-CM

## 2019-09-15 NOTE — Telephone Encounter (Signed)
Received request for pulmonary clearance for colonoscopy. This requires an appt. She has not had Shelly [name] or Shelly [name] done yet. Can we please arrange.

## 2019-09-15 NOTE — Telephone Encounter (Signed)
Called and spoke with pt. Stated to her Dr. Celine Mans received letter stating that pulmonary clearance needed to be done prior to her colonoscopy and she verbalized understanding. I have scheduled pt for spiro/feno appt followed by OV with Beth for clearance. Also scheduled covid test prior to her spiro. Nothing further needed.

## 2019-09-18 ENCOUNTER — Other Ambulatory Visit (HOSPITAL_COMMUNITY)
Admission: RE | Admit: 2019-09-18 | Discharge: 2019-09-18 | Disposition: A | Discharge status: Home / Self Care | Payer: 59 | Source: Ambulatory Visit | Attending: Internal Medicine | Admitting: Internal Medicine

## 2019-09-18 DIAGNOSIS — U071 COVID-19: Secondary | ICD-10-CM | POA: Insufficient documentation

## 2019-09-18 DIAGNOSIS — Z01812 Encounter for preprocedural laboratory examination: Secondary | ICD-10-CM | POA: Diagnosis present

## 2019-09-19 ENCOUNTER — Telehealth: Payer: Self-pay | Admitting: Internal Medicine

## 2019-09-19 LAB — SARS CORONAVIRUS 2 (TAT 6-24 HRS): SARS Coronavirus 2: POSITIVE — AB

## 2019-09-19 NOTE — Progress Notes (Signed)
Patient with positive covid result. Contacted MD and informed of result.   

## 2019-09-19 NOTE — Telephone Encounter (Signed)
Patient is scheduled for spiro/feno 09/21/19.  Patient Covid test was positive.   Please advise if you have been notified and if Patient  Aware.  Message routed to Dr. Celine Mans

## 2019-09-19 NOTE — Telephone Encounter (Signed)
Called and spoke to pt. Informed her of the results and recs per Dr. Celine Mans. Appts has been canceled. Pt did not want to reschedule at this time but states she will call us next month to reschedule.   Will forward to Dr. Celine Mans as Lorain Childes.

## 2019-09-19 NOTE — Telephone Encounter (Signed)
Thanks for letting me know. Please reschedule patient's PFTs. Please call patient to let her know she is covid positive and should quarantine accordingly.

## 2019-09-20 ENCOUNTER — Telehealth: Payer: Self-pay | Admitting: Unknown Physician Specialty

## 2019-09-20 NOTE — Telephone Encounter (Signed)
Called to discuss with Shelly [name] about Covid symptoms and the use of bamlanivimab, a monoclonal antibody infusion for those with mild to moderate Covid symptoms and at a high risk of hospitalization.     Pt does not qualify for infusion therapy as  Her symptoms first presented > 10 days prior to timing of infusion and diagnosed in November.  Symptoms tier reviewed as well as criteria for ending isolation. Preventative practices reviewed. Patient verbalized understanding  Patient Active Problem List   Diagnosis Date Noted  . Allergic reaction 04/21/2015  . Type 2 diabetes, HbA1c goal < 7% (HCC)   . Pyelonephritis 04/20/2015  . Chronic systolic (congestive) heart failure (HCC) 04/20/2015  . Anemia 04/18/2015  . Atherosclerosis of aorta (HCC) 04/18/2015  . Heart failure (HCC) 04/18/2015  . Chronic kidney disease, stage III (moderate) 04/18/2015  . Gastro-esophageal reflux disease without esophagitis 04/18/2015  . Hypertensive heart and chronic kidney disease with heart failure and stage 1 through stage 4 chronic kidney disease, or chronic kidney disease (HCC) 04/18/2015  . Mixed hyperlipidemia 04/18/2015  . Mood disorder (HCC) 04/18/2015  . Secondary pulmonary hypertension 04/18/2015  . History of colonic polyps 04/18/2015  . Type 2 diabetes mellitus with diabetic nephropathy (HCC) 04/18/2015  . 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 Dyspnea   . SOB (shortness of breath)   . Acute CHF (HCC) 06/28/2014  . Acute systolic congestive heart failure (HCC)   . OA (osteoarthritis) of knee 10/27/2013  . Dehydration 09/12/2011  . ARF (acute renal failure) (HCC) 09/12/2011  . Diarrhea 09/12/2011  . Nausea and vomiting 09/12/2011  . Diabetes mellitus (HCC) 09/12/2011  . Asthma 09/12/2011  . Hypertension   . Hyperlipemia   . Acid reflux   . Anxiety   . Obesity

## 2019-09-21 ENCOUNTER — Ambulatory Visit: Payer: 59 | Admitting: Primary Care

## 2019-09-21 ENCOUNTER — Other Ambulatory Visit: Payer: 59

## 2019-09-21 ENCOUNTER — Telehealth: Payer: Self-pay | Admitting: Internal Medicine

## 2019-09-21 NOTE — Telephone Encounter (Signed)
The clearance form is in Dr. Humphrey Rolls to sign folder but it is not signed yet. Patient was supposed to come in for f/u with PFT but from the notes, she cancelled the appts because she tested positive for Covid. I called Cheri to let her know that I have the form and will await Dr. Humphrey Rolls response. I warned her that pt may still have to come in for surgical clearance but after her quarantine time is up. She understood, will forward to Dr. Celine Mans as Lorain Childes.

## 2019-09-27 ENCOUNTER — Ambulatory Visit: Payer: 59 | Attending: Internal Medicine

## 2019-09-27 DIAGNOSIS — Z20822 Contact with and (suspected) exposure to covid-19: Secondary | ICD-10-CM

## 2019-09-28 LAB — NOVEL CORONAVIRUS, NAA: SARS-CoV-2, NAA: NOT DETECTED

## 2019-11-01 ENCOUNTER — Other Ambulatory Visit: Payer: Self-pay

## 2019-11-01 ENCOUNTER — Ambulatory Visit (INDEPENDENT_AMBULATORY_CARE_PROVIDER_SITE_OTHER): Payer: 59 | Admitting: Nurse Practitioner

## 2019-11-01 VITALS — BP 138/88 | HR 85 | Temp 97.8°F | Resp 96 | Wt 236.0 lb

## 2019-11-01 DIAGNOSIS — R000 Tachycardia, unspecified: Secondary | ICD-10-CM

## 2019-11-01 DIAGNOSIS — R0602 Shortness of breath: Secondary | ICD-10-CM

## 2019-11-01 DIAGNOSIS — F488 Other specified nonpsychotic mental disorders: Secondary | ICD-10-CM

## 2019-11-01 DIAGNOSIS — R413 Other amnesia: Secondary | ICD-10-CM

## 2019-11-01 DIAGNOSIS — Z8679 Personal history of other diseases of the circulatory system: Secondary | ICD-10-CM

## 2019-11-01 DIAGNOSIS — Z8616 Personal history of COVID-19: Secondary | ICD-10-CM

## 2019-11-01 DIAGNOSIS — R4189 Other symptoms and signs involving cognitive functions and awareness: Secondary | ICD-10-CM

## 2019-11-01 NOTE — Patient Instructions (Addendum)
Asthma Shortness of breath:  Continue Advair, Singulair, and rescue inhalers  Stay active  Will get follow up set up with pulmonary - may need pulmonary rehab - pt is awaiting a PFT - may need 6 minute walk and chest x ray  Patient walked in office today O2 sats dropped to 89% on RA and heart rate was elevated with minimal exertion    Frequent Throat clearing:  -Continue gastroesophageal reflux disease treatment with elevating the head your bed and taking antacids  -Continue taking over-the-counter antihistamines and nasal fluticasone to help with allergic rhinitis  -You need to try to suppress your throat clearing to allow your larynx (voice box) to heal.  For three days don't talk, laugh, sing, or clear your throat. Take a sip of water when you feel the need to clear your throat.   -Use hard candies (sugarless Jolly Ranchers) or non-mint or non-menthol containing cough drops during this time to soothe your throat.   History of CHF Tachycardia Shortness of breath:  Please make follow up appointment with cardiology - may need repeat echo  EKG: NSR  Brain fog Memory loss:  Will refer to neurology   Follow up: post covid care center as needed

## 2019-11-01 NOTE — Progress Notes (Signed)
@Patient  ID: , female    DOB: [birth year], 64 y.o.   MRN: [id number]  Chief Complaint  Patient presents with  . Post COVID    Patient tested positive on 3/8. Patient stated she also believes she had it 08/2018 but it was too soon too test, also tested postitive 05/2019 at [facility]. Still have sx of SOB, brain fog and Hoarseness.    Referring provider: [facility], MD   64 year old female with history of hypertension, CHF, asthma, GERD, IBS, diabetes, CKD.   HPI   Patient presents today for post Covid care clinic visit.  She reports that she was sick in February 2020 with flulike symptoms and thinks that she may have had Covid at that time but was not tested.  She became sick again in November 2020 and did test positive for Covid at that time.  She was never hospitalized. She states that she has been having ongoing shortness of breath with exertion, memory loss, brain fog, hoarse throat since November. Patient still works and tries to stay active. 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 She enjoys fishing, but states that she can not get her boat out anymore because she doesn't have the strength and becomes too short winded. Patient is compliant with Advair. She states that she could not tolerate Singulair due to nightmares. Patient states that she is supposed to be scheduled with pulmonary for PFT sometime in the near future. Denies f/c/s, n/v/d, hemoptysis, PND, chest pain or edema.    Note: Patient walked in office today O2 sats dropped to 89% on RA and heart rate was elevated with minimal exertion    Note: patient's heart rhythm was irregular during exam today - EKG: NSR - suspect she has occasional PVC's     Allergies  Allergen Reactions  . Amlodipine Besylate     Other reaction(s): fatgiue and nausea  . Atorvastatin     Other reaction(s): strange feelings  . Lisinopril  Cough  . Metformin Diarrhea  . Penicillins Other (See Comments)    Other reaction(s): rash Unknown reaction.   . Sulfa Antibiotics Nausea And Vomiting  . Welchol  [Colesevelam Hcl]     Other reaction(s): constipation  . Dilaudid [Hydromorphone Hcl] Anxiety and Other (See Comments)    paranoia  . Sulfamethoxazole-Trimethoprim Rash    Immunization History  Administered Date(s) Administered  . Influenza Split 06/16/2013, 03/13/2014  . Influenza,inj,Quad PF,6+ Mos 05/01/2011, 04/29/2012, 05/15/2019  . Influenza,inj,Quad PF,6-35 Mos 05/30/2010  . Influenza,inj,quad, With Preservative 05/11/2014  . Pneumococcal Polysaccharide-23 08/05/2012  . Td 04/23/1995  . Tdap 01/27/2013    Past Medical History:  Diagnosis Date  . Acid reflux   . Anemia   . Anxiety   . Arthritis   . Asthma    infrequent problems  . Cardiomyopathy (HCC)   . CHF (congestive heart failure) (HCC)   . Chronic UTI (urinary tract infection)   . Complication of anesthesia    "I quit breathing during surgery" 2008 - has had surgery since with no problems   . Diabetes mellitus   . Endometriosis    hx of   . Headache    hx of migraines  . Hepatitis B    hx of dx during ruptured appendix hospitalization  . Hyperlipemia   . Hypertension   . IBS (irritable bowel syndrome)   . Irritable bowel syndrome   .  Kidney disease   . Obesity   . Pneumonia 12/2013  . Pneumonia   . PONV (postoperative nausea and vomiting)   . Rash    sides of neck  . Stress incontinence   . Tuberculosis 1985    Tobacco History: Social History   Tobacco Use  Smoking Status Never Smoker  Smokeless Tobacco Never Used   Counseling given: Yes   Outpatient Encounter Medications as of 11/01/2019  Medication Sig  . albuterol (PROVENTIL) (2.5 MG/3ML) 0.083% nebulizer solution Take 3 mLs (2.5 mg total) by nebulization every 6 (six) hours as needed for wheezing or shortness of breath.  . ALPRAZolam (XANAX) 0.5 MG tablet Take 0.5 mg by  mouth 3 (three) times daily as needed. Anxiety  . carvedilol (COREG) 25 MG tablet Take 1 tablet by mouth daily at 8 pm.   . cloNIDine (CATAPRES) 0.2 MG tablet Take 0.2 mg by mouth 2 (two) times daily.  . COMBIVENT RESPIMAT 20-100 MCG/ACT AERS respimat Take 1 puff by mouth daily.  Marland Kitchen doxazosin (CARDURA) 1 MG tablet Take 1 mg by mouth daily.  . fluticasone (FLONASE) 50 MCG/ACT nasal spray Place 1 spray into both nostrils daily as needed. For allergy nasal congestion  . Fluticasone-Salmeterol (ADVAIR DISKUS) 250-50 MCG/DOSE AEPB Inhale 1 puff into the lungs 2 (two) times daily.  . furosemide (LASIX) 40 MG tablet TAKE ONE TABLET BY MOUTH TWICE DAILY (Patient taking differently: take 1 tablet by mouth once daily)  . HYDROcodone-acetaminophen (NORCO/VICODIN) 5-325 MG tablet Take 1 tablet by mouth every 6 (six) hours as needed.  . Insulin Glargine (TOUJEO SOLOSTAR [place]) Inject 40-80 Units into the skin daily.   . nitrofurantoin (MACRODANTIN) 100 MG capsule Take 100 mg by mouth 3 (three) times daily.  . promethazine (PHENERGAN) 25 MG tablet Take 1 tablet (25 mg total) by mouth daily as needed.  . montelukast (SINGULAIR) 10 MG tablet Take 1 tablet (10 mg total) by mouth at bedtime. (Patient not taking: Reported on 11/01/2019)   No facility-administered encounter medications on file as of 11/01/2019.     Review of Systems  Review of Systems  Constitutional: Positive for activity change (decreased). Negative for chills and fever.  HENT: Negative.   Respiratory: Positive for shortness of breath. Negative for cough.        Frequent throat clearing  Cardiovascular: Negative.  Negative for chest pain, palpitations and leg swelling.  Gastrointestinal: Negative.   Allergic/Immunologic: Negative.   Neurological: Negative.   Psychiatric/Behavioral: Positive for decreased concentration.       Physical Exam  BP 138/88 (BP Location: Left Arm, Patient Position: Sitting, Cuff Size: Large)   Pulse 85   Temp  97.8 F (36.6 C)   Resp (!) 96   Wt 236 lb (107 kg)   BMI 43.16 kg/m   Wt Readings from Last 5 Encounters:  11/02/19 236 lb (107 kg)  07/26/19 233 lb 9.6 oz (106 kg)  01/06/18 235 lb 4 oz (106.7 kg)  09/20/16 228 lb (103.4 kg)  08/24/16 228 lb (103.4 kg)     Physical Exam Vitals and nursing note reviewed.  Constitutional:      General: She is not in acute distress.    Appearance: She is well-developed.  Cardiovascular:     Rate and Rhythm: Normal rate. Rhythm irregular.  Pulmonary:     Effort: Pulmonary effort is normal. No respiratory distress.     Breath sounds: Normal breath sounds. No wheezing or rhonchi.  Musculoskeletal:     Right lower  leg: No edema.     Left lower leg: No edema.  Neurological:     Mental Status: She is alert and oriented to person, place, and time.  Psychiatric:        Mood and Affect: Mood normal.        Behavior: Behavior normal.        Assessment & Plan:   History of COVID-19 Asthma Shortness of breath:  Will order chest x ray  Continue Advair, Singulair, and rescue inhalers  Stay active  Will get follow up set up with pulmonary - may need pulmonary rehab - pt is awaiting a PFT - may need 6 minute walk and chest x ray  Patient walked in office today O2 sats dropped to 89% on RA and heart rate was elevated with minimal exertion    Frequent Throat clearing:  -Continue gastroesophageal reflux disease treatment with elevating the head your bed and taking antacids  -Continue taking over-the-counter antihistamines and nasal fluticasone to help with allergic rhinitis  -You need to try to suppress your throat clearing to allow your larynx (voice box) to heal.  For three days don't talk, laugh, sing, or clear your throat. Take a sip of water when you feel the need to clear your throat.   -Use hard candies (sugarless Jolly Ranchers) or non-mint or non-menthol containing cough drops during this time to soothe your throat.   History  of CHF Tachycardia Shortness of breath:  Please make follow up appointment with cardiology - may need repeat echo  EKG: NSR  Brain fog Memory loss:  Will refer to neurology   Follow up: post covid care center as needed       Fenton Foy, NP 11/02/2019

## 2019-11-02 DIAGNOSIS — R413 Other amnesia: Secondary | ICD-10-CM | POA: Insufficient documentation

## 2019-11-02 DIAGNOSIS — Z8679 Personal history of other diseases of the circulatory system: Secondary | ICD-10-CM | POA: Insufficient documentation

## 2019-11-02 DIAGNOSIS — F488 Other specified nonpsychotic mental disorders: Secondary | ICD-10-CM | POA: Insufficient documentation

## 2019-11-02 DIAGNOSIS — Z8616 Personal history of COVID-19: Secondary | ICD-10-CM | POA: Insufficient documentation

## 2019-11-02 DIAGNOSIS — R4189 Other symptoms and signs involving cognitive functions and awareness: Secondary | ICD-10-CM | POA: Insufficient documentation

## 2019-11-02 DIAGNOSIS — R000 Tachycardia, unspecified: Secondary | ICD-10-CM | POA: Insufficient documentation

## 2019-11-02 NOTE — Assessment & Plan Note (Addendum)
Asthma Shortness of breath:  Will order chest x ray  Continue Advair, Singulair, and rescue inhalers  Stay active  Will get follow up set up with pulmonary - may need pulmonary rehab - pt is awaiting a PFT - may need 6 minute walk and chest x ray  Patient walked in office today O2 sats dropped to 89% on RA and heart rate was elevated with minimal exertion    Frequent Throat clearing:  -Continue gastroesophageal reflux disease treatment with elevating the head your bed and taking antacids  -Continue taking over-the-counter antihistamines and nasal fluticasone to help with allergic rhinitis  -You need to try to suppress your throat clearing to allow your larynx (voice box) to heal.  For three days don't talk, laugh, sing, or clear your throat. Take a sip of water when you feel the need to clear your throat.   -Use hard candies (sugarless Jolly Ranchers) or non-mint or non-menthol containing cough drops during this time to soothe your throat.   History of CHF Tachycardia Shortness of breath:  Please make follow up appointment with cardiology - may need repeat echo  EKG: NSR  Brain fog Memory loss:  Will refer to neurology   Follow up: post covid care center as needed

## 2019-11-06 ENCOUNTER — Other Ambulatory Visit (HOSPITAL_COMMUNITY): Payer: Self-pay | Admitting: Family Medicine

## 2019-11-06 DIAGNOSIS — I509 Heart failure, unspecified: Secondary | ICD-10-CM

## 2019-11-23 ENCOUNTER — Other Ambulatory Visit: Payer: Self-pay

## 2019-11-23 ENCOUNTER — Ambulatory Visit (HOSPITAL_COMMUNITY): Payer: 59 | Attending: Cardiovascular Disease

## 2019-11-23 DIAGNOSIS — I509 Heart failure, unspecified: Secondary | ICD-10-CM

## 2019-12-13 ENCOUNTER — Ambulatory Visit: Payer: Self-pay | Admitting: Neurology

## 2020-01-18 ENCOUNTER — Other Ambulatory Visit: Payer: Self-pay | Admitting: *Deleted

## 2020-01-18 DIAGNOSIS — U071 COVID-19: Secondary | ICD-10-CM

## 2020-01-18 DIAGNOSIS — J4540 Moderate persistent asthma, uncomplicated: Secondary | ICD-10-CM

## 2020-01-19 ENCOUNTER — Other Ambulatory Visit: Payer: Self-pay

## 2020-01-19 ENCOUNTER — Ambulatory Visit (INDEPENDENT_AMBULATORY_CARE_PROVIDER_SITE_OTHER): Payer: 59

## 2020-01-19 DIAGNOSIS — J4540 Moderate persistent asthma, uncomplicated: Secondary | ICD-10-CM | POA: Diagnosis not present

## 2020-01-19 LAB — NITRIC OXIDE: Nitric Oxide: 7

## 2020-01-23 ENCOUNTER — Encounter: Payer: Self-pay | Admitting: Pulmonary Disease

## 2020-01-23 ENCOUNTER — Other Ambulatory Visit: Payer: Self-pay

## 2020-01-23 ENCOUNTER — Ambulatory Visit (INDEPENDENT_AMBULATORY_CARE_PROVIDER_SITE_OTHER): Payer: 59 | Admitting: Pulmonary Disease

## 2020-01-23 DIAGNOSIS — R490 Dysphonia: Secondary | ICD-10-CM | POA: Diagnosis not present

## 2020-01-23 DIAGNOSIS — R0600 Dyspnea, unspecified: Secondary | ICD-10-CM

## 2020-01-23 DIAGNOSIS — R0609 Other forms of dyspnea: Secondary | ICD-10-CM

## 2020-01-23 DIAGNOSIS — Z8616 Personal history of COVID-19: Secondary | ICD-10-CM | POA: Diagnosis not present

## 2020-01-23 DIAGNOSIS — J4540 Moderate persistent asthma, uncomplicated: Secondary | ICD-10-CM | POA: Diagnosis not present

## 2020-01-23 NOTE — Assessment & Plan Note (Signed)
Dyspnea is likely multifactorial given the patient's history of a COVID-19 infection in November/2020, persistent hoarseness, asthma, weight gain, sedentary lifestyle.  There is restriction seen on patient's spirometry.  Will order a spirometry with DLCO with lung volumes in order to better evaluate the restriction.  Will obtain chest x-ray imaging.  If restriction is present and DLCO is reduced will likely need to consider high-resolution CT chest imaging to further evaluate.  With patient's persistent hoarseness as well as shortness of breath I believe it is reasonable for her to be evaluated by ear nose and throat to ensure there is no vocal cord dysfunction.  Plan: Chest x-ray Spirometry with DLCO ordered Continue Advair 250 Referral to ear nose and throat Follow-up with our office in 6 to 8 weeks

## 2020-01-23 NOTE — Patient Instructions (Addendum)
You were seen today by Coral Ceo, NP  for:   1. Moderate persistent asthma, unspecified whether complicated 2. History of COVID-19  - Pulmonary function test; Future  Continue Advair 250  3. Dyspnea on exertion  - Pulmonary function test; Future - DG Chest 2 View; Future  Please present to [address] to obtain a chest x-ray  We will order a spirometry with DLCO to further evaluate your continued shortness of breath as well as difficulty taking a full deep breath   4. Hoarseness of voice  - Ambulatory referral to ENT  With your hoarseness persisting since November/2020 will recommend evaluation by ear nose and throat specialist to directly visualize her vocal cords  We recommend today:  Orders Placed This Encounter  Procedures  . DG Chest 2 View    Standing Status:   Future    Standing Expiration Date:   05/25/2020    Order Specific Question:   Reason for Exam (SYMPTOM  OR DIAGNOSIS REQUIRED)    Answer:   doe / post covid 54    Order Specific Question:   Preferred imaging location?    Answer:   [facility]    Order Specific Question:   Radiology Contrast Protocol - do NOT remove file path    Answer:   [email or web address]  . Ambulatory referral to ENT    Referral Priority:   Routine    Referral Type:   Consultation    Referral Reason:   Specialty Services Required    Requested Specialty:   Otolaryngology    Number of Visits Requested:   1  . Pulmonary function test    [name] and dlco    Standing Status:   Future    Standing Expiration Date:   01/22/2021    Order Specific Question:   Where should this test be performed?    Answer:   [facility]   Orders Placed This Encounter  Procedures  . DG Chest 2 View  . Ambulatory referral to ENT  . Pulmonary function test   No orders of the defined types were placed in this encounter.   Follow Up:    Return in about 6 weeks (around 03/05/2020), or if symptoms  worsen or fail to improve, for Follow up with Elisha Headland FNP-C, Follow up for PFT - , Spiro with DLCO.   Please do your part to reduce the spread of COVID-19:      Reduce your risk of any infection  and COVID19 by using the similar precautions used for avoiding the common cold or flu:  Marland Kitchen Wash your hands often with soap and warm water for at least 20 seconds.  If soap and water are not readily available, use an alcohol-based hand sanitizer with at least 60% alcohol.  . If coughing or sneezing, cover your mouth and nose by coughing or sneezing into the elbow areas of your shirt or coat, into a tissue or into your sleeve (not your hands). Drinda Butts A MASK when in public  . Avoid shaking hands with others and consider head nods or verbal greetings only. . Avoid touching your eyes, nose, or mouth with unwashed hands.  . Avoid close contact with people who are sick. . Avoid places or events with large numbers of people in one location, like concerts or sporting events. . If you have some symptoms but not all symptoms, continue to monitor at home and seek medical attention if your symptoms worsen. . If you are  having a medical emergency, call 911.   ADDITIONAL HEALTHCARE OPTIONS FOR PATIENTS  [facility] / e-Visit: [email or web address]         [facility]: [phone]  [facility]: [phone]                   [facility]: [phone]     It is flu season:   >>> Best ways to protect herself from the flu: Receive the yearly flu vaccine, practice good hand hygiene washing with soap and also using hand sanitizer when available, eat a nutritious meals, get adequate rest, hydrate appropriately   Please contact the office if your symptoms worsen or you have concerns that you are not improving.   Thank you for choosing [facility] for your healthcare, and for allowing [place] to partner  with you on your healthcare journey. I am thankful to be able to provide care to you today.   Elisha Headland FNP-C

## 2020-01-23 NOTE — Assessment & Plan Note (Signed)
Plan: Spirometry with DLCO ordered 6 to 8-week follow-up with our office Patient would likely benefit from a walk at that time Chest x-ray today May need to consider high-resolution CT imaging based off of spirometry with DLCO to further evaluate restriction as well as potential diffusion defect

## 2020-01-23 NOTE — Assessment & Plan Note (Signed)
Plan: Continue Advair 250 Continue daily Zyrtec Spirometry with DLCO ordered Chest x-ray

## 2020-01-23 NOTE — Assessment & Plan Note (Signed)
Persistent hoarseness since COVID-19 in November/2020  Plan: Referral to ear nose and throat for vocal cord evaluation

## 2020-01-23 NOTE — Progress Notes (Signed)
Virtual Visit via Telephone Note  I connected with Shelly [name]  on 01/23/20 at  1:30 PM EDT by telephone and verified that I am speaking with the correct person using two identifiers.  Location: Patient: Home Provider: Office [facility] - [address] [place], [address], [place], [place] [address]   I discussed the limitations, risks, security and privacy concerns of performing an evaluation and management service by telephone and the availability of in person appointments. I also discussed with the patient that there may be a patient responsible charge related to this service. The patient expressed understanding and agreed to proceed.  Patient consented to consult via telephone: Yes People present and their role in pt care: Pt     History of Present Illness:  64 year old female never smoker followed in our office for moderate persistent asthma  Past medical history: Type 2 diabetes, hypertension, hyperlipidemia, CHF, GERD, mood disorder, history of COVID-19 infection (November/2021) Smoke history: Never smoker Maintenance: Advair 250 Patient of Dr. Celine [name]  Chief complaint: Shelly [name] / FENO    64 year old female never smoker last evaluated in our office in January/2021 by Dr. Celine [name].  This was the original consult.  It was recommended that she resume ICS/LABA.  She was encouraged to resume Advair as she had previously been maintained on this.  She was encouraged to remain on Singulair.  It was recommended that she obtain spirometry and Fino prior to next visit.  Patient reports she has been doing better since last visit.  The Advair has helped.  She had to stop the Singulair due to it causing nightmares.  She is still taking Zyrtec.  She continues to have a raspy/hoarse voice.  This is heard audibly over the telephonic visit.  She reports that this is happened ever since Covid.  She has not yet been evaluated by ear nose and throat.  She has attempted voice rest, lozenges, teas, warm  drinks, nothing seems to improve the hoarseness.  The hoarseness never returned back to her baseline voice.  Last chest x-ray in January was negative for acute cardiopulmonary disease.  Observations/Objective:  01/19/2020-spirometry-FVC 1.5 (51% addicted), postbronchodilator ratio 72, postbronchodilator FEV1 1.2 (52% predicted) >>>Moderate airway obstruction, no significant response to bronchodilator  Lab Results  Component Value Date   NITRICOXIDE 7 01/19/2020  >>> off advair   Social History   Tobacco Use  Smoking Status Never Smoker  Smokeless Tobacco Never Used   Immunization History  Administered Date(s) Administered   Influenza Split 06/16/2013, 03/13/2014   Influenza,inj,Quad PF,6+ Mos 05/01/2011, 04/29/2012, 05/15/2019   Influenza,inj,Quad PF,6-35 Mos 05/30/2010   Influenza,inj,quad, With Preservative 05/11/2014   Janssen (J&J) SARS-COV-2 Vaccination 10/06/2019   Pneumococcal Polysaccharide-23 08/05/2012   Td 04/23/1995   Tdap 01/27/2013      Assessment and Plan:  Asthma Plan: Continue Advair 250 Continue daily Zyrtec Spirometry with DLCO ordered Chest x-ray  Dyspnea Dyspnea is likely multifactorial given the patient's history of a COVID-19 infection in November/2020, persistent hoarseness, asthma, weight gain, sedentary lifestyle.  There is restriction seen on patient's spirometry.  Will order a spirometry with DLCO with lung volumes in order to better evaluate the restriction.  Will obtain chest x-ray imaging.  If restriction is present and DLCO is reduced will likely need to consider high-resolution CT chest imaging to further evaluate.  With patient's persistent hoarseness as well as shortness of breath I believe it is reasonable for her to be evaluated by ear nose and throat to ensure there is no  vocal cord dysfunction.  Plan: Chest x-ray Spirometry with DLCO ordered Continue Advair 250 Referral to ear nose and throat Follow-up with our office in  6 to 8 weeks  History of COVID-19 Plan: Spirometry with DLCO ordered 6 to 8-week follow-up with our office Patient would likely benefit from a walk at that time Chest x-ray today May need to consider high-resolution CT imaging based off of spirometry with DLCO to further evaluate restriction as well as potential diffusion defect  Hoarseness of voice Persistent hoarseness since COVID-19 in November/2020  Plan: Referral to ear nose and throat for vocal cord evaluation   Follow Up Instructions:  Return in about 6 weeks (around 03/05/2020), or if symptoms worsen or fail to improve, for Follow up with Shelly Headland FNP-C, Follow up for PFT - , Spiro with DLCO.   I discussed the assessment and treatment plan with the patient. The patient was provided an opportunity to ask questions and all were answered. The patient agreed with the plan and demonstrated an understanding of the instructions.   The patient was advised to call back or seek an in-person evaluation if the symptoms worsen or if the condition fails to improve as anticipated.  I provided 32 minutes of non-face-to-face time during this encounter.   Shelly Ceo, NP

## 2020-01-24 ENCOUNTER — Other Ambulatory Visit: Payer: Self-pay | Admitting: Internal Medicine

## 2020-01-24 DIAGNOSIS — J4540 Moderate persistent asthma, uncomplicated: Secondary | ICD-10-CM

## 2020-01-29 ENCOUNTER — Ambulatory Visit: Payer: Self-pay | Admitting: Neurology

## 2020-01-30 ENCOUNTER — Telehealth: Payer: Self-pay | Admitting: *Deleted

## 2020-01-30 ENCOUNTER — Other Ambulatory Visit: Payer: Self-pay

## 2020-01-30 ENCOUNTER — Ambulatory Visit (INDEPENDENT_AMBULATORY_CARE_PROVIDER_SITE_OTHER)
Admission: RE | Admit: 2020-01-30 | Discharge: 2020-01-30 | Disposition: A | Discharge status: Home / Self Care | Payer: 59 | Source: Ambulatory Visit | Attending: Pulmonary Disease | Admitting: Pulmonary Disease

## 2020-01-30 DIAGNOSIS — R0600 Dyspnea, unspecified: Secondary | ICD-10-CM

## 2020-01-30 DIAGNOSIS — R0609 Other forms of dyspnea: Secondary | ICD-10-CM

## 2020-01-30 NOTE — Telephone Encounter (Signed)
Patient called and does not have any questions at this time. Follow up appointments reviewed, patient is having CXR at this time.

## 2020-02-01 NOTE — Progress Notes (Signed)
Spoke with pt and notified of results per Brian. Pt verbalized understanding and denied any questions. °

## 2020-03-26 ENCOUNTER — Ambulatory Visit: Payer: 59 | Admitting: Internal Medicine

## 2020-03-26 ENCOUNTER — Ambulatory Visit (INDEPENDENT_AMBULATORY_CARE_PROVIDER_SITE_OTHER): Payer: 59 | Admitting: Internal Medicine

## 2020-03-26 ENCOUNTER — Other Ambulatory Visit: Payer: Self-pay

## 2020-03-26 ENCOUNTER — Encounter: Payer: Self-pay | Admitting: Internal Medicine

## 2020-03-26 VITALS — BP 116/64 | HR 84 | Temp 97.8°F | Ht 62.0 in | Wt 239.0 lb

## 2020-03-26 DIAGNOSIS — R0600 Dyspnea, unspecified: Secondary | ICD-10-CM

## 2020-03-26 DIAGNOSIS — R0609 Other forms of dyspnea: Secondary | ICD-10-CM

## 2020-03-26 DIAGNOSIS — J4540 Moderate persistent asthma, uncomplicated: Secondary | ICD-10-CM

## 2020-03-26 DIAGNOSIS — U071 COVID-19: Secondary | ICD-10-CM | POA: Diagnosis not present

## 2020-03-26 DIAGNOSIS — Z8616 Personal history of COVID-19: Secondary | ICD-10-CM

## 2020-03-26 DIAGNOSIS — K219 Gastro-esophageal reflux disease without esophagitis: Secondary | ICD-10-CM

## 2020-03-26 LAB — PULMONARY FUNCTION TEST
DL/VA % pred: 97 %
DL/VA: 4.13 ml/min/mmHg/L
DLCO unc % pred: 72 %
DLCO unc: 13.51 ml/min/mmHg
FEF 25-75 Pre: 0.87 L/sec
FEF2575-%Pred-Pre: 42 %
FEV1-%Pred-Pre: 50 %
FEV1-Pre: 1.14 L
FEV1FVC-%Pred-Pre: 96 %
FEV6-%Pred-Pre: 54 %
FEV6-Pre: 1.53 L
FEV6FVC-%Pred-Pre: 104 %
FVC-%Pred-Pre: 51 %
FVC-Pre: 1.53 L
Pre FEV1/FVC ratio: 74 %
Pre FEV6/FVC Ratio: 100 %

## 2020-03-26 MED ORDER — DULERA 100-5 MCG/ACT IN AERO: 2.0000 | INHALATION_SPRAY | Freq: Two times a day (BID) | RESPIRATORY_TRACT | 5 refills | Status: DC

## 2020-03-26 NOTE — Progress Notes (Signed)
Shelly [name]    [id number]    08/16/1955  Primary Care Physician:Shaw, Rockney Ghee, MD Date of Appointment: 03/26/2020 Established Patient Visit  Chief complaint:   Chief Complaint  Patient presents with  . Follow-up    shortness of breath , asthma, PFT     HPI: Moderate Persistent Asthma. History of viral myocardiitis.Recurrent pneumonia and bronchitis growing up. Also has history of Extensive abdominal surgeries and endometriosis. S/p colectomy  Interval Updates:  Saw Dr. Rubye Oaks at [facility] and was started on BID PPI which has helped voice hoarseness.   Current Regimen: combivent and albuterol, singulair, advair BID Asthma Triggers: cold air, URI  Exacerbations in the last year: 2 History of hospitalization or intubation: None Hives: None Allergy Testing: Has not had GERD: Yes Allergic Rhinitis: Yes, uncontrolled ACT: No flowsheet data found.  FeNO: 7ppb  I have reviewed the patient's family social and past medical history and updated as appropriate.   Past Medical History:  Diagnosis Date  . Acid reflux   . Anemia   . Anxiety   . Arthritis   . Asthma    infrequent problems  . Cardiomyopathy (HCC)   . CHF (congestive heart failure) (HCC)   . Chronic UTI (urinary tract infection)   . Complication of anesthesia    "I quit breathing during surgery" 2008 - has had surgery since with no problems   . Diabetes mellitus   . Endometriosis    hx of   . Headache    hx of migraines  . Hepatitis B    hx of dx during ruptured appendix hospitalization  . Hyperlipemia   . Hypertension   . IBS (irritable bowel syndrome)   . Irritable bowel syndrome   . Kidney disease   . Obesity   . Pneumonia 12/2013  . Pneumonia   . PONV (postoperative nausea and vomiting)   . Rash    sides of neck  . Stress incontinence   . Tuberculosis 1985    Past Surgical History:  Procedure Laterality Date  . ABDOMINAL ADHESION SURGERY  1989   x 7  . ABDOMINAL HYSTERECTOMY   01/1996  . APPENDECTOMY  1987  . CHOLECYSTECTOMY  11/24/2000  . COLON SURGERY  2010   colon resection due to adhesions  . LAPAROSCOPIC LYSIS OF ADHESIONS  07/1991   pelvic adhesions  . LEFT AND RIGHT HEART CATHETERIZATION WITH CORONARY ANGIOGRAM N/A 06/29/2014   Procedure: LEFT AND RIGHT HEART CATHETERIZATION WITH CORONARY ANGIOGRAM;  Surgeon: Corky Crafts, MD;  Location: [facility] CATH LAB;  Service: Cardiovascular;  Laterality: N/A;  . OOPHORECTOMY Right 04/1995  . OOPHORECTOMY Left 12/1992   removed left ovary  . PARTIAL KNEE ARTHROPLASTY Left 10/27/2013   Procedure: LEFT KNEE UNICOMPARTMENTAL REPLACEMENT ;  Surgeon: Loanne Drilling, MD;  Location: [facility] ORS;  Service: Orthopedics;  Laterality: Left;  . PARTIAL KNEE ARTHROPLASTY Right 05/21/2014   Procedure: MEDIAL UNICOMPARTMENTAL RIGHT KNEE ARTHROPLASTY;  Surgeon: Loanne Drilling, MD;  Location: [facility] ORS;  Service: Orthopedics;  Laterality: Right;  . TONSILLECTOMY      Family History  Problem Relation Age of Onset  . Heart failure Mother   . Heart disease Father   . Diabetes Father   . Liver cancer Brother     Social History   Occupational History  . Not on file  Tobacco Use  . Smoking status: Never Smoker  . Smokeless tobacco: Never Used  Substance and Sexual Activity  .  Alcohol use: Yes    Comment: occ  . Drug use: No  . Sexual activity: Not on file     Physical Exam: Blood pressure 116/64, pulse 84, temperature 97.8 F (36.6 C), temperature source Temporal, height 5\' 2"  (1.575 m), weight 239 lb (108.4 kg), SpO2 93 %.  Gen:      No acute distress ENT:  no nasal polyps, mucus membranes moist Lungs:    No increased respiratory effort, symmetric chest wall excursion, clear to auscultation bilaterally, no wheezes or crackles CV:         Regular rate and rhythm; no murmurs, rubs, or gallops.  No pedal edema   Data Reviewed: Imaging: I have personally reviewed the chest xray from July 2021. No acute cardiopulmonary  process. No change from jan 2021.   PFTs:  PFT Results Latest Ref Rng & Units 03/26/2020  FVC-Pre L 1.53  FVC-Predicted Pre % 51  Pre FEV1/FVC % % 74  FEV1-Pre L 1.14  FEV1-Predicted Pre % 50  DLCO uncorrected ml/min/mmHg 13.51  DLCO UNC% % 72  DLVA Predicted % 97   I have personally reviewed the patient's PFTs and there is no airflow limitation. Diffusion capacity mildly reduced but not corrected for hemoglobin.    Echo May 2021  1. Left ventricular ejection fraction, by estimation, is 45 to 50%. Left  ventricular ejection fraction by 3D volume is 48 %. The left ventricle has  mildly decreased function. The left ventricle demonstrates global  hypokinesis. Left ventricular diastolic  parameters are consistent with Grade I diastolic dysfunction (impaired  relaxation). Elevated left ventricular end-diastolic pressure.  2. Right ventricular systolic function is normal. The right ventricular  size is normal. There is normal pulmonary artery systolic pressure.  3. Left atrial size was moderately dilated.  4. The mitral valve is normal in structure. Trivial mitral valve  regurgitation. No evidence of mitral stenosis.  5. The aortic valve is normal in structure. Aortic valve regurgitation is  not visualized. Mild aortic valve stenosis. Aortic valve area, by VTI  measures 1.47 cm. Aortic valve mean gradient measures 12.0 mmHg. Aortic  valve Vmax measures 2.34 m/s.  6. The inferior vena cava is normal in size with greater than 50%  respiratory variability, suggesting right atrial pressure of 3 mmHg.  Labs:  Immunization status: Immunization History  Administered Date(s) Administered  . Influenza Split 06/16/2013, 03/13/2014  . Influenza,inj,Quad PF,6+ Mos 05/01/2011, 04/29/2012, 05/15/2019  . Influenza,inj,Quad PF,6-35 Mos 05/30/2010  . Influenza,inj,quad, With Preservative 05/11/2014  . Janssen (J&J) SARS-COV-2 Vaccination 10/06/2019  . Pneumococcal Polysaccharide-23  08/05/2012  . Td 04/23/1995  . Tdap 01/27/2013    Assessment:  Shortness of Breath HFrEF Ef 45% COVID 19 infection March 2021 LPR - saw ENT Dr. April 2021 at [facility]  Plan/Recommendations: Reviewed PFTs with her today. She has no crackles on exam and no hypoxemia. Her chest xray and exam are reassuring that she doesn't have fibrosis or ILD. She was ambulated and desaturated from 94% to 90% on room air. No hypoxemia.   Continue ICS-LABA will switch to dulera due to insurance formulary vs generic advair.Continue Zyrtec Switch singulair to morning to see if that helps bad dreams otherwise we can try zafirlukast.  Continue BID PPI. Her voice hoarseness is substantially improved with LPR treatment.  I spent 41 minutes on 03/26/2020 in care of this patient including face to face time and non-face to face time spent charting, review of outside records, and coordination of care.   Return to Care:  Return in about 4 months (around 07/26/2020).   Durel Salts, MD Pulmonary and Critical Care Medicine [facility] Office:[phone]

## 2020-03-26 NOTE — Progress Notes (Signed)
Spirometry/DLCO performed today. 

## 2020-03-26 NOTE — Patient Instructions (Signed)
The patient should have follow up scheduled with myself in 4 months.   Switch from Advair to dulera, 2 puffs twice a day. Start taking montelukast in the morning and see if that improves your side effects. If that does not help call us and let us know and we can prescribe an alternative medication.

## 2020-03-28 ENCOUNTER — Telehealth: Payer: Self-pay | Admitting: Internal Medicine

## 2020-03-28 NOTE — Telephone Encounter (Signed)
Called and spoke with pt. Pt stated insurance will not cover Dulera but the covered alternatives are Flovent diskus and Arnuity.  Dr. Celine Mans, please advise on this.

## 2020-03-29 NOTE — Telephone Encounter (Signed)
Ok to substitute arnuity.

## 2020-03-29 NOTE — Telephone Encounter (Signed)
Dr. Celine Mans, please advise which dose of Arnuity you want [place] to send to pharmacy for pt.

## 2020-04-01 MED ORDER — ARNUITY ELLIPTA 200 MCG/ACT IN AEPB: 1.0000 | INHALATION_SPRAY | Freq: Every day | RESPIRATORY_TRACT | 11 refills | Status: DC

## 2020-04-01 NOTE — Telephone Encounter (Signed)
Arnuity once daily

## 2020-04-01 NOTE — Telephone Encounter (Signed)
Spoke with the pt and made aware of inhaler change  Rx was sent to her preferred pharm  Nothing further needed

## 2020-04-01 NOTE — Telephone Encounter (Signed)
Pt is calling regarding her medication switch - states that she is out of medication - she would like to pick up while in [place] - please advise - pharmacy - [facility] on [address] - CB# for pt  [phone]

## 2020-10-29 ENCOUNTER — Other Ambulatory Visit: Payer: Self-pay | Admitting: Family Medicine

## 2020-10-29 DIAGNOSIS — Z1231 Encounter for screening mammogram for malignant neoplasm of breast: Secondary | ICD-10-CM

## 2020-12-25 ENCOUNTER — Ambulatory Visit
Admission: RE | Admit: 2020-12-25 | Discharge: 2020-12-25 | Disposition: A | Discharge status: Home / Self Care | Payer: 59 | Source: Ambulatory Visit | Attending: Family Medicine | Admitting: Family Medicine

## 2020-12-25 ENCOUNTER — Other Ambulatory Visit: Payer: Self-pay

## 2020-12-25 DIAGNOSIS — Z1231 Encounter for screening mammogram for malignant neoplasm of breast: Secondary | ICD-10-CM

## 2021-02-27 ENCOUNTER — Other Ambulatory Visit: Payer: Self-pay | Admitting: Internal Medicine

## 2021-02-27 ENCOUNTER — Other Ambulatory Visit: Payer: Self-pay

## 2021-02-27 ENCOUNTER — Ambulatory Visit
Admission: RE | Admit: 2021-02-27 | Discharge: 2021-02-27 | Disposition: A | Discharge status: Home / Self Care | Payer: 59 | Source: Ambulatory Visit | Attending: Internal Medicine | Admitting: Internal Medicine

## 2021-02-27 DIAGNOSIS — M79672 Pain in left foot: Secondary | ICD-10-CM

## 2021-09-03 ENCOUNTER — Other Ambulatory Visit: Payer: Self-pay | Admitting: Physician Assistant

## 2021-09-03 DIAGNOSIS — R109 Unspecified abdominal pain: Secondary | ICD-10-CM

## 2021-09-03 DIAGNOSIS — D509 Iron deficiency anemia, unspecified: Secondary | ICD-10-CM

## 2021-09-11 ENCOUNTER — Ambulatory Visit
Admission: RE | Admit: 2021-09-11 | Discharge: 2021-09-11 | Disposition: A | Discharge status: Home / Self Care | Payer: 59 | Source: Ambulatory Visit | Attending: Physician Assistant | Admitting: Physician Assistant

## 2021-09-11 ENCOUNTER — Other Ambulatory Visit: Payer: Self-pay

## 2021-09-11 DIAGNOSIS — R109 Unspecified abdominal pain: Secondary | ICD-10-CM

## 2021-09-11 DIAGNOSIS — D509 Iron deficiency anemia, unspecified: Secondary | ICD-10-CM

## 2021-10-31 ENCOUNTER — Other Ambulatory Visit: Payer: Self-pay | Admitting: Family Medicine

## 2021-10-31 DIAGNOSIS — E2839 Other primary ovarian failure: Secondary | ICD-10-CM

## 2021-11-03 ENCOUNTER — Other Ambulatory Visit: Payer: Self-pay | Admitting: Advanced Practice Midwife

## 2021-11-03 DIAGNOSIS — Z1231 Encounter for screening mammogram for malignant neoplasm of breast: Secondary | ICD-10-CM

## 2022-04-23 ENCOUNTER — Ambulatory Visit
Admission: RE | Admit: 2022-04-23 | Discharge: 2022-04-23 | Disposition: A | Discharge status: Home / Self Care | Payer: 59 | Source: Ambulatory Visit | Attending: Family Medicine | Admitting: Family Medicine

## 2022-04-23 ENCOUNTER — Ambulatory Visit
Admission: RE | Admit: 2022-04-23 | Discharge: 2022-04-23 | Disposition: A | Discharge status: Home / Self Care | Payer: 59 | Source: Ambulatory Visit | Attending: Advanced Practice Midwife | Admitting: Advanced Practice Midwife

## 2022-04-23 DIAGNOSIS — Z1231 Encounter for screening mammogram for malignant neoplasm of breast: Secondary | ICD-10-CM

## 2022-04-23 DIAGNOSIS — E2839 Other primary ovarian failure: Secondary | ICD-10-CM

## 2022-04-24 ENCOUNTER — Other Ambulatory Visit: Payer: Self-pay | Admitting: Advanced Practice Midwife

## 2022-04-24 DIAGNOSIS — R928 Other abnormal and inconclusive findings on diagnostic imaging of breast: Secondary | ICD-10-CM

## 2022-04-30 ENCOUNTER — Other Ambulatory Visit: Payer: Self-pay | Admitting: Family Medicine

## 2022-04-30 DIAGNOSIS — R928 Other abnormal and inconclusive findings on diagnostic imaging of breast: Secondary | ICD-10-CM

## 2022-05-01 ENCOUNTER — Other Ambulatory Visit: Payer: Self-pay | Admitting: Family Medicine

## 2022-05-01 ENCOUNTER — Ambulatory Visit
Admission: RE | Admit: 2022-05-01 | Discharge: 2022-05-01 | Disposition: A | Discharge status: Home / Self Care | Payer: 59 | Source: Ambulatory Visit | Attending: Advanced Practice Midwife | Admitting: Advanced Practice Midwife

## 2022-05-01 DIAGNOSIS — R921 Mammographic calcification found on diagnostic imaging of breast: Secondary | ICD-10-CM

## 2022-05-01 DIAGNOSIS — R928 Other abnormal and inconclusive findings on diagnostic imaging of breast: Secondary | ICD-10-CM

## 2022-05-18 ENCOUNTER — Ambulatory Visit
Admission: RE | Admit: 2022-05-18 | Discharge: 2022-05-18 | Disposition: A | Discharge status: Home / Self Care | Payer: 59 | Source: Ambulatory Visit | Attending: Family Medicine | Admitting: Family Medicine

## 2022-05-18 DIAGNOSIS — R921 Mammographic calcification found on diagnostic imaging of breast: Secondary | ICD-10-CM

## 2022-05-18 HISTORY — PX: BREAST BIOPSY: SHX20

## 2022-10-14 DIAGNOSIS — H43393 Other vitreous opacities, bilateral: Secondary | ICD-10-CM | POA: Diagnosis not present

## 2022-10-14 DIAGNOSIS — E113292 Type 2 diabetes mellitus with mild nonproliferative diabetic retinopathy without macular edema, left eye: Secondary | ICD-10-CM | POA: Diagnosis not present

## 2022-10-14 DIAGNOSIS — H2513 Age-related nuclear cataract, bilateral: Secondary | ICD-10-CM | POA: Diagnosis not present

## 2022-10-14 DIAGNOSIS — E113211 Type 2 diabetes mellitus with mild nonproliferative diabetic retinopathy with macular edema, right eye: Secondary | ICD-10-CM | POA: Diagnosis not present

## 2022-11-02 DIAGNOSIS — N183 Chronic kidney disease, stage 3 unspecified: Secondary | ICD-10-CM | POA: Diagnosis not present

## 2022-11-02 DIAGNOSIS — I700 Atherosclerosis of aorta: Secondary | ICD-10-CM | POA: Diagnosis not present

## 2022-11-02 DIAGNOSIS — Z0000 Encounter for general adult medical examination without abnormal findings: Secondary | ICD-10-CM | POA: Diagnosis not present

## 2022-11-02 DIAGNOSIS — I130 Hypertensive heart and chronic kidney disease with heart failure and stage 1 through stage 4 chronic kidney disease, or unspecified chronic kidney disease: Secondary | ICD-10-CM | POA: Diagnosis not present

## 2022-11-02 DIAGNOSIS — J45909 Unspecified asthma, uncomplicated: Secondary | ICD-10-CM | POA: Diagnosis not present

## 2022-11-02 DIAGNOSIS — E782 Mixed hyperlipidemia: Secondary | ICD-10-CM | POA: Diagnosis not present

## 2022-11-02 DIAGNOSIS — Z1159 Encounter for screening for other viral diseases: Secondary | ICD-10-CM | POA: Diagnosis not present

## 2022-11-02 DIAGNOSIS — I509 Heart failure, unspecified: Secondary | ICD-10-CM | POA: Diagnosis not present

## 2022-11-02 DIAGNOSIS — I272 Pulmonary hypertension, unspecified: Secondary | ICD-10-CM | POA: Diagnosis not present

## 2022-11-02 DIAGNOSIS — E1121 Type 2 diabetes mellitus with diabetic nephropathy: Secondary | ICD-10-CM | POA: Diagnosis not present

## 2022-11-02 DIAGNOSIS — Z23 Encounter for immunization: Secondary | ICD-10-CM | POA: Diagnosis not present

## 2022-11-02 DIAGNOSIS — D649 Anemia, unspecified: Secondary | ICD-10-CM | POA: Diagnosis not present

## 2022-11-23 DIAGNOSIS — I129 Hypertensive chronic kidney disease with stage 1 through stage 4 chronic kidney disease, or unspecified chronic kidney disease: Secondary | ICD-10-CM | POA: Diagnosis not present

## 2022-11-23 DIAGNOSIS — K219 Gastro-esophageal reflux disease without esophagitis: Secondary | ICD-10-CM | POA: Diagnosis not present

## 2022-11-23 DIAGNOSIS — N184 Chronic kidney disease, stage 4 (severe): Secondary | ICD-10-CM | POA: Diagnosis not present

## 2022-11-23 DIAGNOSIS — D649 Anemia, unspecified: Secondary | ICD-10-CM | POA: Diagnosis not present

## 2022-11-23 DIAGNOSIS — N179 Acute kidney failure, unspecified: Secondary | ICD-10-CM | POA: Diagnosis not present

## 2022-11-23 DIAGNOSIS — N390 Urinary tract infection, site not specified: Secondary | ICD-10-CM | POA: Diagnosis not present

## 2022-11-23 DIAGNOSIS — E1122 Type 2 diabetes mellitus with diabetic chronic kidney disease: Secondary | ICD-10-CM | POA: Diagnosis not present

## 2022-11-25 ENCOUNTER — Other Ambulatory Visit: Payer: Self-pay | Admitting: Internal Medicine

## 2022-11-25 DIAGNOSIS — N184 Chronic kidney disease, stage 4 (severe): Secondary | ICD-10-CM

## 2022-11-27 ENCOUNTER — Ambulatory Visit
Admission: RE | Admit: 2022-11-27 | Discharge: 2022-11-27 | Disposition: A | Discharge status: Home / Self Care | Payer: Medicare Other | Source: Ambulatory Visit | Attending: Internal Medicine | Admitting: Internal Medicine

## 2022-11-27 DIAGNOSIS — N184 Chronic kidney disease, stage 4 (severe): Secondary | ICD-10-CM

## 2022-11-27 DIAGNOSIS — N189 Chronic kidney disease, unspecified: Secondary | ICD-10-CM | POA: Diagnosis not present

## 2022-12-16 DIAGNOSIS — E1122 Type 2 diabetes mellitus with diabetic chronic kidney disease: Secondary | ICD-10-CM | POA: Diagnosis not present

## 2022-12-16 DIAGNOSIS — N179 Acute kidney failure, unspecified: Secondary | ICD-10-CM | POA: Diagnosis not present

## 2022-12-16 DIAGNOSIS — I129 Hypertensive chronic kidney disease with stage 1 through stage 4 chronic kidney disease, or unspecified chronic kidney disease: Secondary | ICD-10-CM | POA: Diagnosis not present

## 2022-12-16 DIAGNOSIS — N184 Chronic kidney disease, stage 4 (severe): Secondary | ICD-10-CM | POA: Diagnosis not present

## 2022-12-16 DIAGNOSIS — N390 Urinary tract infection, site not specified: Secondary | ICD-10-CM | POA: Diagnosis not present

## 2022-12-16 DIAGNOSIS — D649 Anemia, unspecified: Secondary | ICD-10-CM | POA: Diagnosis not present

## 2022-12-16 DIAGNOSIS — K219 Gastro-esophageal reflux disease without esophagitis: Secondary | ICD-10-CM | POA: Diagnosis not present

## 2023-02-25 DIAGNOSIS — E1122 Type 2 diabetes mellitus with diabetic chronic kidney disease: Secondary | ICD-10-CM | POA: Diagnosis not present

## 2023-02-25 DIAGNOSIS — I129 Hypertensive chronic kidney disease with stage 1 through stage 4 chronic kidney disease, or unspecified chronic kidney disease: Secondary | ICD-10-CM | POA: Diagnosis not present

## 2023-02-25 DIAGNOSIS — K219 Gastro-esophageal reflux disease without esophagitis: Secondary | ICD-10-CM | POA: Diagnosis not present

## 2023-02-25 DIAGNOSIS — N179 Acute kidney failure, unspecified: Secondary | ICD-10-CM | POA: Diagnosis not present

## 2023-02-25 DIAGNOSIS — N184 Chronic kidney disease, stage 4 (severe): Secondary | ICD-10-CM | POA: Diagnosis not present

## 2023-02-25 DIAGNOSIS — D649 Anemia, unspecified: Secondary | ICD-10-CM | POA: Diagnosis not present

## 2023-04-05 ENCOUNTER — Other Ambulatory Visit: Payer: Self-pay | Admitting: Family Medicine

## 2023-04-05 DIAGNOSIS — R1032 Left lower quadrant pain: Secondary | ICD-10-CM

## 2023-04-05 DIAGNOSIS — N390 Urinary tract infection, site not specified: Secondary | ICD-10-CM | POA: Diagnosis not present

## 2023-04-07 ENCOUNTER — Ambulatory Visit
Admission: RE | Admit: 2023-04-07 | Discharge: 2023-04-07 | Disposition: A | Discharge status: Home / Self Care | Payer: Medicare Other | Source: Ambulatory Visit | Attending: Family Medicine | Admitting: Family Medicine

## 2023-04-07 ENCOUNTER — Other Ambulatory Visit: Payer: Self-pay | Admitting: Family Medicine

## 2023-04-07 DIAGNOSIS — R1032 Left lower quadrant pain: Secondary | ICD-10-CM

## 2023-04-07 DIAGNOSIS — K5732 Diverticulitis of large intestine without perforation or abscess without bleeding: Secondary | ICD-10-CM | POA: Diagnosis not present

## 2023-04-21 DIAGNOSIS — E113211 Type 2 diabetes mellitus with mild nonproliferative diabetic retinopathy with macular edema, right eye: Secondary | ICD-10-CM | POA: Diagnosis not present

## 2023-05-10 DIAGNOSIS — E1122 Type 2 diabetes mellitus with diabetic chronic kidney disease: Secondary | ICD-10-CM | POA: Diagnosis not present

## 2023-05-10 DIAGNOSIS — Z23 Encounter for immunization: Secondary | ICD-10-CM | POA: Diagnosis not present

## 2023-05-10 DIAGNOSIS — I130 Hypertensive heart and chronic kidney disease with heart failure and stage 1 through stage 4 chronic kidney disease, or unspecified chronic kidney disease: Secondary | ICD-10-CM | POA: Diagnosis not present

## 2023-05-10 DIAGNOSIS — N184 Chronic kidney disease, stage 4 (severe): Secondary | ICD-10-CM | POA: Diagnosis not present

## 2023-05-10 DIAGNOSIS — I509 Heart failure, unspecified: Secondary | ICD-10-CM | POA: Diagnosis not present

## 2023-05-10 DIAGNOSIS — E782 Mixed hyperlipidemia: Secondary | ICD-10-CM | POA: Diagnosis not present

## 2023-05-10 DIAGNOSIS — E113211 Type 2 diabetes mellitus with mild nonproliferative diabetic retinopathy with macular edema, right eye: Secondary | ICD-10-CM | POA: Diagnosis not present

## 2023-09-08 DIAGNOSIS — D649 Anemia, unspecified: Secondary | ICD-10-CM | POA: Diagnosis not present

## 2023-09-08 DIAGNOSIS — I129 Hypertensive chronic kidney disease with stage 1 through stage 4 chronic kidney disease, or unspecified chronic kidney disease: Secondary | ICD-10-CM | POA: Diagnosis not present

## 2023-09-08 DIAGNOSIS — E1122 Type 2 diabetes mellitus with diabetic chronic kidney disease: Secondary | ICD-10-CM | POA: Diagnosis not present

## 2023-09-08 DIAGNOSIS — K219 Gastro-esophageal reflux disease without esophagitis: Secondary | ICD-10-CM | POA: Diagnosis not present

## 2023-09-08 DIAGNOSIS — N179 Acute kidney failure, unspecified: Secondary | ICD-10-CM | POA: Diagnosis not present

## 2023-09-08 DIAGNOSIS — N184 Chronic kidney disease, stage 4 (severe): Secondary | ICD-10-CM | POA: Diagnosis not present

## 2023-09-28 DIAGNOSIS — H25811 Combined forms of age-related cataract, right eye: Secondary | ICD-10-CM | POA: Diagnosis not present

## 2023-09-28 DIAGNOSIS — E119 Type 2 diabetes mellitus without complications: Secondary | ICD-10-CM | POA: Diagnosis not present

## 2023-09-28 DIAGNOSIS — E113211 Type 2 diabetes mellitus with mild nonproliferative diabetic retinopathy with macular edema, right eye: Secondary | ICD-10-CM | POA: Diagnosis not present

## 2023-09-28 DIAGNOSIS — H52221 Regular astigmatism, right eye: Secondary | ICD-10-CM | POA: Diagnosis not present

## 2023-09-28 DIAGNOSIS — Z01818 Encounter for other preprocedural examination: Secondary | ICD-10-CM | POA: Diagnosis not present

## 2023-09-28 DIAGNOSIS — H25813 Combined forms of age-related cataract, bilateral: Secondary | ICD-10-CM | POA: Diagnosis not present

## 2023-10-20 DIAGNOSIS — H269 Unspecified cataract: Secondary | ICD-10-CM | POA: Diagnosis not present

## 2023-10-20 DIAGNOSIS — H52201 Unspecified astigmatism, right eye: Secondary | ICD-10-CM | POA: Diagnosis not present

## 2023-10-20 DIAGNOSIS — H25811 Combined forms of age-related cataract, right eye: Secondary | ICD-10-CM | POA: Diagnosis not present

## 2023-10-20 DIAGNOSIS — H2511 Age-related nuclear cataract, right eye: Secondary | ICD-10-CM | POA: Diagnosis not present

## 2023-11-01 DIAGNOSIS — H2512 Age-related nuclear cataract, left eye: Secondary | ICD-10-CM | POA: Diagnosis not present

## 2023-11-01 DIAGNOSIS — H52202 Unspecified astigmatism, left eye: Secondary | ICD-10-CM | POA: Diagnosis not present

## 2023-11-01 DIAGNOSIS — H25812 Combined forms of age-related cataract, left eye: Secondary | ICD-10-CM | POA: Diagnosis not present

## 2023-11-03 ENCOUNTER — Other Ambulatory Visit: Payer: Self-pay | Admitting: Family Medicine

## 2023-11-03 DIAGNOSIS — Z1231 Encounter for screening mammogram for malignant neoplasm of breast: Secondary | ICD-10-CM

## 2023-11-03 DIAGNOSIS — E1122 Type 2 diabetes mellitus with diabetic chronic kidney disease: Secondary | ICD-10-CM | POA: Diagnosis not present

## 2023-11-03 DIAGNOSIS — N184 Chronic kidney disease, stage 4 (severe): Secondary | ICD-10-CM | POA: Diagnosis not present

## 2023-11-03 DIAGNOSIS — I509 Heart failure, unspecified: Secondary | ICD-10-CM | POA: Diagnosis not present

## 2023-11-03 DIAGNOSIS — I2721 Secondary pulmonary arterial hypertension: Secondary | ICD-10-CM | POA: Diagnosis not present

## 2023-11-03 DIAGNOSIS — E782 Mixed hyperlipidemia: Secondary | ICD-10-CM | POA: Diagnosis not present

## 2023-11-03 DIAGNOSIS — M159 Polyosteoarthritis, unspecified: Secondary | ICD-10-CM | POA: Diagnosis not present

## 2023-11-03 DIAGNOSIS — I130 Hypertensive heart and chronic kidney disease with heart failure and stage 1 through stage 4 chronic kidney disease, or unspecified chronic kidney disease: Secondary | ICD-10-CM | POA: Diagnosis not present

## 2023-11-03 DIAGNOSIS — D649 Anemia, unspecified: Secondary | ICD-10-CM | POA: Diagnosis not present

## 2023-11-03 DIAGNOSIS — Z0000 Encounter for general adult medical examination without abnormal findings: Secondary | ICD-10-CM | POA: Diagnosis not present

## 2023-11-03 DIAGNOSIS — J45909 Unspecified asthma, uncomplicated: Secondary | ICD-10-CM | POA: Diagnosis not present

## 2023-11-11 ENCOUNTER — Ambulatory Visit

## 2023-11-18 DIAGNOSIS — C4359 Malignant melanoma of other part of trunk: Secondary | ICD-10-CM | POA: Diagnosis not present

## 2023-11-18 DIAGNOSIS — D485 Neoplasm of uncertain behavior of skin: Secondary | ICD-10-CM | POA: Diagnosis not present

## 2023-11-19 ENCOUNTER — Encounter (HOSPITAL_COMMUNITY): Payer: Self-pay

## 2023-11-26 ENCOUNTER — Ambulatory Visit
Admission: RE | Admit: 2023-11-26 | Discharge: 2023-11-26 | Disposition: A | Discharge status: Home / Self Care | Source: Ambulatory Visit | Attending: Family Medicine | Admitting: Family Medicine

## 2023-11-26 DIAGNOSIS — Z1231 Encounter for screening mammogram for malignant neoplasm of breast: Secondary | ICD-10-CM

## 2023-12-01 DIAGNOSIS — Z973 Presence of spectacles and contact lenses: Secondary | ICD-10-CM | POA: Diagnosis not present

## 2023-12-02 ENCOUNTER — Other Ambulatory Visit: Payer: Self-pay | Admitting: Family Medicine

## 2023-12-02 DIAGNOSIS — R928 Other abnormal and inconclusive findings on diagnostic imaging of breast: Secondary | ICD-10-CM

## 2023-12-11 DIAGNOSIS — E113211 Type 2 diabetes mellitus with mild nonproliferative diabetic retinopathy with macular edema, right eye: Secondary | ICD-10-CM | POA: Diagnosis not present

## 2023-12-11 DIAGNOSIS — J45909 Unspecified asthma, uncomplicated: Secondary | ICD-10-CM | POA: Diagnosis not present

## 2023-12-11 DIAGNOSIS — E782 Mixed hyperlipidemia: Secondary | ICD-10-CM | POA: Diagnosis not present

## 2023-12-11 DIAGNOSIS — I130 Hypertensive heart and chronic kidney disease with heart failure and stage 1 through stage 4 chronic kidney disease, or unspecified chronic kidney disease: Secondary | ICD-10-CM | POA: Diagnosis not present

## 2023-12-15 DIAGNOSIS — C4359 Malignant melanoma of other part of trunk: Secondary | ICD-10-CM | POA: Diagnosis not present

## 2023-12-15 DIAGNOSIS — L905 Scar conditions and fibrosis of skin: Secondary | ICD-10-CM | POA: Diagnosis not present

## 2023-12-17 ENCOUNTER — Ambulatory Visit
Admission: RE | Admit: 2023-12-17 | Discharge: 2023-12-17 | Disposition: A | Discharge status: Home / Self Care | Source: Ambulatory Visit | Attending: Family Medicine | Admitting: Family Medicine

## 2023-12-17 ENCOUNTER — Ambulatory Visit

## 2023-12-17 DIAGNOSIS — R928 Other abnormal and inconclusive findings on diagnostic imaging of breast: Secondary | ICD-10-CM | POA: Diagnosis not present

## 2023-12-31 ENCOUNTER — Telehealth: Payer: Self-pay | Admitting: Pharmacist

## 2023-12-31 NOTE — Progress Notes (Signed)
   12/31/2023  Patient ID: Shelly [name], female   DOB: Apr 07, 1956, 68 y.o.   MRN: [id number]  Received referral from Dr. Bernetta Brilliant regarding statin usage in patient.  Patient background:   Tried: Crestor  40mg  daily (sluggish or lethargic), Atorvastatin 10mg  (2013- strange feelings), Fluvastatin 90mg  (2009- unknown), Welchol 625mg  half tablet (2013- constipation)  Does not appear that Rosuvastatin  has been picked up since October 2022 per fill report  Could consider Rosuvastatin  20mg  daily + CoQ-10, Pravastatin 40mg  (moderate), Zetia 10mg  daily  39.14 % 10-Year Total CVD Risk  10-Year ASCVD Risk: 22.54% 10-Year Heart Failure Risk: 32.05% 10-Year Coronary Heart Disease Risk: 15.66% 10-Year Stroke Risk: 11.09%  46.30 %= 30-Year Total CVD Risk  1 mile every morning after coffee + Tuesday yoga + Thursday pilates + Sunday mobility for ankles ([employer]) Breakfast: Cottage cheese pineapple, greek low-fat yogurt with blueberries + granola, keto toast + avocado/roasted dried tomatoes Lunch: Administrator, sports: Chicken, pork loin, steak once per week w/ a grilled vegetable (a lot of chicken), salmon once per week Drinks: Water, unsweet tea + Splenda occasional   Phone call: Called and spoke with the patient about the above. Reviewed diet and past statin medications. Unknown as to why her cholesterol is increasing. As for rosuvastatin , patient reports taking the 40mg  as directed for 2 weeks with fatigue and feeling of recurring kidney infection. After holding the medication while at the beach, she returned home feeling much better and energized. Advised that Rosuvastatin  40mg  is to be renally adjusted to 5 or 10mg  daily based on her kidney function (newer evidence for adjustment). Could re-try the medication in the future, but at a lower dose or similar medication like Pravastatin. Patient deferred option of statins at this time and would prefer to start Zetia instead. Discussed mechanism of  action and to monitor for nausea as most common side effect. Advised that this will only lower her LDL by 15%, whereas a statin would decrease by 30-50%. Confirmed understanding but still prefers to trial the Zetia for now instead. Advised if statin is needed in the future, can use with CoQ-10 to help with any muscle pain.   Dr. Bernetta Brilliant, could we please send Rx for Zetia 10mg  #90DS to [facility]? Patient deferred statin use until next lipid panel re-check. Thank you!  Delvin File, PharmD [facility]  Phone Number: [phone]

## 2024-01-10 ENCOUNTER — Telehealth: Payer: Self-pay | Admitting: Pharmacist

## 2024-01-10 DIAGNOSIS — I130 Hypertensive heart and chronic kidney disease with heart failure and stage 1 through stage 4 chronic kidney disease, or unspecified chronic kidney disease: Secondary | ICD-10-CM | POA: Diagnosis not present

## 2024-01-10 DIAGNOSIS — E113211 Type 2 diabetes mellitus with mild nonproliferative diabetic retinopathy with macular edema, right eye: Secondary | ICD-10-CM | POA: Diagnosis not present

## 2024-01-10 DIAGNOSIS — E782 Mixed hyperlipidemia: Secondary | ICD-10-CM | POA: Diagnosis not present

## 2024-01-10 DIAGNOSIS — J45909 Unspecified asthma, uncomplicated: Secondary | ICD-10-CM | POA: Diagnosis not present

## 2024-01-10 NOTE — Progress Notes (Signed)
   01/10/2024  Patient ID: Shelly [name], female   DOB: April 11, 1956, 68 y.o.   MRN: [id number]  Called and spoke with the patient on the phone today. Reports doing well with the Zetia so far and no side effects. Will continue to take until visit in October and let the office know if any new concerns arise.  No follow-up needed from me at this time.    Aloysius Lewis, PharmD [facility]  Phone Number: [phone]

## 2024-01-12 DIAGNOSIS — K5900 Constipation, unspecified: Secondary | ICD-10-CM | POA: Diagnosis not present

## 2024-02-10 DIAGNOSIS — E113211 Type 2 diabetes mellitus with mild nonproliferative diabetic retinopathy with macular edema, right eye: Secondary | ICD-10-CM | POA: Diagnosis not present

## 2024-02-10 DIAGNOSIS — E782 Mixed hyperlipidemia: Secondary | ICD-10-CM | POA: Diagnosis not present

## 2024-02-10 DIAGNOSIS — I130 Hypertensive heart and chronic kidney disease with heart failure and stage 1 through stage 4 chronic kidney disease, or unspecified chronic kidney disease: Secondary | ICD-10-CM | POA: Diagnosis not present

## 2024-02-10 DIAGNOSIS — J45909 Unspecified asthma, uncomplicated: Secondary | ICD-10-CM | POA: Diagnosis not present

## 2024-03-06 ENCOUNTER — Encounter (HOSPITAL_COMMUNITY): Payer: Self-pay | Admitting: *Deleted

## 2024-03-06 ENCOUNTER — Emergency Department (HOSPITAL_COMMUNITY)

## 2024-03-06 ENCOUNTER — Emergency Department (HOSPITAL_COMMUNITY)
Admission: EM | Admit: 2024-03-06 | Discharge: 2024-03-06 | Disposition: A | Discharge status: Home / Self Care | Attending: Emergency Medicine | Admitting: Emergency Medicine

## 2024-03-06 ENCOUNTER — Other Ambulatory Visit: Payer: Self-pay

## 2024-03-06 DIAGNOSIS — I159 Secondary hypertension, unspecified: Secondary | ICD-10-CM | POA: Insufficient documentation

## 2024-03-06 DIAGNOSIS — Z794 Long term (current) use of insulin: Secondary | ICD-10-CM | POA: Diagnosis not present

## 2024-03-06 DIAGNOSIS — R110 Nausea: Secondary | ICD-10-CM | POA: Diagnosis not present

## 2024-03-06 DIAGNOSIS — I700 Atherosclerosis of aorta: Secondary | ICD-10-CM | POA: Diagnosis not present

## 2024-03-06 DIAGNOSIS — M546 Pain in thoracic spine: Secondary | ICD-10-CM | POA: Insufficient documentation

## 2024-03-06 DIAGNOSIS — E1122 Type 2 diabetes mellitus with diabetic chronic kidney disease: Secondary | ICD-10-CM | POA: Insufficient documentation

## 2024-03-06 DIAGNOSIS — M25511 Pain in right shoulder: Secondary | ICD-10-CM | POA: Diagnosis not present

## 2024-03-06 DIAGNOSIS — N189 Chronic kidney disease, unspecified: Secondary | ICD-10-CM | POA: Insufficient documentation

## 2024-03-06 DIAGNOSIS — I509 Heart failure, unspecified: Secondary | ICD-10-CM | POA: Insufficient documentation

## 2024-03-06 DIAGNOSIS — Z79899 Other long term (current) drug therapy: Secondary | ICD-10-CM | POA: Insufficient documentation

## 2024-03-06 DIAGNOSIS — I10 Essential (primary) hypertension: Secondary | ICD-10-CM | POA: Diagnosis not present

## 2024-03-06 DIAGNOSIS — I130 Hypertensive heart and chronic kidney disease with heart failure and stage 1 through stage 4 chronic kidney disease, or unspecified chronic kidney disease: Secondary | ICD-10-CM | POA: Insufficient documentation

## 2024-03-06 DIAGNOSIS — M549 Dorsalgia, unspecified: Secondary | ICD-10-CM | POA: Diagnosis not present

## 2024-03-06 LAB — BASIC METABOLIC PANEL WITH GFR
Anion gap: 4 — ABNORMAL LOW (ref 5–15)
BUN: 60 mg/dL — ABNORMAL HIGH (ref 8–23)
CO2: 24 mmol/L (ref 22–32)
Calcium: 8.6 mg/dL — ABNORMAL LOW (ref 8.9–10.3)
Chloride: 109 mmol/L (ref 98–111)
Creatinine, Ser: 2.19 mg/dL — ABNORMAL HIGH (ref 0.44–1.00)
GFR, Estimated: 24 mL/min — ABNORMAL LOW (ref >60)
Glucose, Bld: 168 mg/dL — ABNORMAL HIGH (ref 70–99)
Potassium: 5.1 mmol/L (ref 3.5–5.1)
Sodium: 137 mmol/L (ref 135–145)

## 2024-03-06 LAB — COMPREHENSIVE METABOLIC PANEL WITH GFR
ALT: 11 U/L (ref 0–44)
AST: 14 U/L — ABNORMAL LOW (ref 15–41)
Albumin: 2.7 g/dL — ABNORMAL LOW (ref 3.5–5.0)
Alkaline Phosphatase: 62 U/L (ref 38–126)
Anion gap: 6 (ref 5–15)
BUN: 60 mg/dL — ABNORMAL HIGH (ref 8–23)
CO2: 22 mmol/L (ref 22–32)
Calcium: 8.4 mg/dL — ABNORMAL LOW (ref 8.9–10.3)
Chloride: 108 mmol/L (ref 98–111)
Creatinine, Ser: 2.12 mg/dL — ABNORMAL HIGH (ref 0.44–1.00)
GFR, Estimated: 25 mL/min — ABNORMAL LOW (ref >60)
Glucose, Bld: 167 mg/dL — ABNORMAL HIGH (ref 70–99)
Potassium: 4.9 mmol/L (ref 3.5–5.1)
Sodium: 136 mmol/L (ref 135–145)
Total Bilirubin: 0.4 mg/dL (ref 0.0–1.2)
Total Protein: 5.6 g/dL — ABNORMAL LOW (ref 6.5–8.1)

## 2024-03-06 LAB — TROPONIN I (HIGH SENSITIVITY)
Troponin I (High Sensitivity): 23 ng/L — ABNORMAL HIGH (ref <18)
Troponin I (High Sensitivity): 19 ng/L — ABNORMAL HIGH (ref <18)

## 2024-03-06 LAB — CBC
HCT: 32.6 % — ABNORMAL LOW (ref 36.0–46.0)
Hemoglobin: 10.8 g/dL — ABNORMAL LOW (ref 12.0–15.0)
MCH: 31.4 pg (ref 26.0–34.0)
MCHC: 33.1 g/dL (ref 30.0–36.0)
MCV: 94.8 fL (ref 80.0–100.0)
Platelets: 233 K/uL (ref 150–400)
RBC: 3.44 MIL/uL — ABNORMAL LOW (ref 3.87–5.11)
RDW: 12.2 % (ref 11.5–15.5)
WBC: 11.3 K/uL — ABNORMAL HIGH (ref 4.0–10.5)
nRBC: 0 % (ref 0.0–0.2)

## 2024-03-06 LAB — BRAIN NATRIURETIC PEPTIDE: B Natriuretic Peptide: 167.9 pg/mL — ABNORMAL HIGH (ref 0.0–100.0)

## 2024-03-06 LAB — CBG MONITORING, ED: Glucose-Capillary: 156 mg/dL — ABNORMAL HIGH (ref 70–99)

## 2024-03-06 LAB — MAGNESIUM: Magnesium: 1.8 mg/dL (ref 1.7–2.4)

## 2024-03-06 MED ORDER — ASPIRIN 81 MG PO CHEW
324.0000 mg | CHEWABLE_TABLET | Freq: Once | ORAL | Status: AC
  Administered 2024-03-06: 324 mg via ORAL
  Filled 2024-03-06: qty 4

## 2024-03-06 NOTE — ED Triage Notes (Signed)
 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 This am woke with severe pain in her right scapula and felt like she had lock jaw, denies chest pain. States the pain has eased up some now.

## 2024-03-06 NOTE — ED Notes (Signed)
 ED Provider at bedside.

## 2024-03-06 NOTE — ED Provider Notes (Signed)
 [place] EMERGENCY DEPARTMENT AT [facility] Provider Note   CSN: [id number] Arrival date & time: 03/06/24  9372     Patient presents with: Back Pain   Shelly [name] is a 68 y.o. female.  With a history of myocarditis, heart failure, type 2 diabetes, hypertension and CKD who presents to the ED for scapular pain.  Patient awoke from sleep around 0500 with pain in her jaw.  She was able to fall back asleep but then awoke shortly after with pain in the right scapula.  This last lasted for period of 20 minutes before resolving spontaneously.  She has never had similar episodes of jaw pain or scapular pain in the past.  No inciting trauma or recent falls.  Asymptomatic now.  Notes recently increased hypertension despite taking her antihypertensive regimen of carvedilol  clonidine.  No chest pain shortness of breath jaw pain diaphoresis nausea vomiting fevers chills or recent illness.    Back Pain      Prior to Admission medications   Medication Sig Start Date End Date Taking? Authorizing Provider  albuterol  (PROVENTIL ) (2.5 MG/3ML) 0.083% nebulizer solution Take 3 mLs (2.5 mg total) by nebulization every 6 (six) hours as needed for wheezing or shortness of breath. 07/26/19   Meade Verdon RAMAN, MD  ALPRAZolam  (XANAX ) 0.5 MG tablet Take 0.5 mg by mouth 3 (three) times daily as needed. Anxiety    [provider]  carvedilol  (COREG ) 25 MG tablet Take 1 tablet by mouth daily at 8 pm.  02/20/15   [provider]  cloNIDine (CATAPRES) 0.2 MG tablet Take 0.2 mg by mouth 2 (two) times daily. 07/19/19   [provider]  COMBIVENT RESPIMAT 20-100 MCG/ACT AERS respimat Take 1 puff by mouth daily. 07/23/16   [provider]  doxazosin (CARDURA) 1 MG tablet Take 1 mg by mouth daily. 07/19/19   [provider]  fluticasone  (FLONASE ) 50 MCG/ACT nasal spray Place 1 spray into both nostrils daily as needed. For allergy nasal congestion 05/31/14   [provider]  Fluticasone  Furoate (ARNUITY ELLIPTA ) 200 MCG/ACT AEPB Inhale 1 Package into the lungs daily. 04/01/20   Meade Verdon RAMAN, MD  furosemide  (LASIX ) 40 MG tablet TAKE ONE TABLET BY MOUTH TWICE DAILY Patient taking differently: take 1 tablet by mouth once daily 06/10/16   Lavona Agent, MD  HYDROcodone -acetaminophen  (NORCO/VICODIN) 5-325 MG tablet Take 1 tablet by mouth every 6 (six) hours as needed.    [provider]  Insulin  Glargine (TOUJEO  SOLOSTAR Diaz) Inject 40-80 Units into the skin daily.     [provider]  mometasone -formoterol  (DULERA ) 100-5 MCG/ACT AERO Inhale 2 puffs into the lungs in the morning and at bedtime. 03/26/20   Desai, Nikita S, MD  nitrofurantoin (MACRODANTIN) 100 MG capsule Take 100 mg by mouth 3 (three) times daily.    [provider]  promethazine  (PHENERGAN ) 25 MG tablet Take 1 tablet (25 mg total) by mouth daily as needed. 09/20/16   Mesner, Selinda, MD    Allergies: Singulair  [montelukast  sodium], Amlodipine  besylate, Atorvastatin, Lisinopril, Metformin, Penicillins, Sulfa antibiotics, Welchol  [colesevelam hcl], Dilaudid  [hydromorphone  hcl], and Sulfamethoxazole-trimethoprim    Review of Systems  Musculoskeletal:  Positive for back pain.    Updated Vital Signs BP (!) 172/73   Pulse 65   Temp 97.8 F (36.6 C) (Oral)   Resp 16   Ht 5' 2 (1.575 m)   Wt 73.9 kg   SpO2 97%   BMI 29.81 kg/m  Physical Exam Vitals and nursing note reviewed.  HENT:     Head: Normocephalic and atraumatic.  Eyes:     Pupils: Pupils are equal, round, and reactive to light.  Cardiovascular:     Rate and Rhythm: Normal rate and regular rhythm.  Pulmonary:     Effort: Pulmonary effort is normal.     Breath sounds: Normal breath sounds.  Abdominal:     Palpations: Abdomen is soft.     Tenderness: There is no abdominal tenderness.  Musculoskeletal:     Right lower leg: No edema.     Left lower leg: No edema.  Skin:    General: Skin is  warm and dry.  Neurological:     Mental Status: She is alert.  Psychiatric:        Mood and Affect: Mood normal.     (all labs ordered are listed, but only abnormal results are displayed) Labs Reviewed  BASIC METABOLIC PANEL WITH GFR - Abnormal; Notable for the following components:      Result Value   Glucose, Bld 168 (*)    BUN 60 (*)    Creatinine, Ser 2.19 (*)    Calcium  8.6 (*)    GFR, Estimated 24 (*)    Anion gap 4 (*)    All other components within normal limits  CBC - Abnormal; Notable for the following components:   WBC 11.3 (*)    RBC 3.44 (*)    Hemoglobin 10.8 (*)    HCT 32.6 (*)    All other components within normal limits  BRAIN NATRIURETIC PEPTIDE - Abnormal; Notable for the following components:   B Natriuretic Peptide 167.9 (*)    All other components within normal limits  COMPREHENSIVE METABOLIC PANEL WITH GFR - Abnormal; Notable for the following components:   Glucose, Bld 167 (*)    BUN 60 (*)    Creatinine, Ser 2.12 (*)    Calcium  8.4 (*)    Total Protein 5.6 (*)    Albumin 2.7 (*)    AST 14 (*)    GFR, Estimated 25 (*)    All other components within normal limits  CBG MONITORING, ED - Abnormal; Notable for the following components:   Glucose-Capillary 156 (*)    All other components within normal limits  TROPONIN I (HIGH SENSITIVITY) - Abnormal; Notable for the following components:   Troponin I (High Sensitivity) 23 (*)    All other components within normal limits  TROPONIN I (HIGH SENSITIVITY) - Abnormal; Notable for the following components:   Troponin I (High Sensitivity) 19 (*)    All other components within normal limits  MAGNESIUM     EKG: EKG Interpretation Date/Time:  Monday March 06 2024 06:36:36 EDT Ventricular Rate:  68 PR Interval:  176 QRS Duration:  94 QT Interval:  412 QTC Calculation: 438 R Axis:   90  Text Interpretation: Normal sinus rhythm Rightward axis ST & T wave abnormality, consider inferolateral ischemia  Abnormal ECG When compared with ECG of 20-Apr-2015 13:46, PREVIOUS ECG IS PRESENT Confirmed by Pamella Sharper [phone]) on 03/06/2024 7:11:42 AM  Radiology: [facility] Chest 2 View Result Date: 03/06/2024 EXAM: 2 VIEW(S) XRAY OF THE CHEST 03/06/2024 07:21:24 AM COMPARISON: 01/30/2020 CLINICAL HISTORY: Back pain. 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 This am woke with severe pain in her right scapula and felt like  she had lock jaw, denies chest pain. States the pain has eased up some now. FINDINGS: LUNGS AND PLEURA: No focal pulmonary opacity. No pulmonary edema. No pleural effusion. No pneumothorax. HEART AND MEDIASTINUM: No acute abnormality of the cardiac and mediastinal silhouettes. Aortic atherosclerotic calcification. BONES AND SOFT TISSUES: Multilevel endplate degenerative changes noted in the thoracic spine. No acute osseous abnormality. IMPRESSION: 1. No acute findings. Electronically signed by: Waddell Calk MD 03/06/2024 07:38 AM EDT RP Workstation: [id number]     Procedures   Medications Ordered in the ED  aspirin  chewable tablet 324 mg (324 mg Oral Given 03/06/24 0741)    Clinical Course as of 03/06/24 1337  Mon Mar 06, 2024  1335 Renal function baseline.  No elevation in delta troponin.  Suspicion for ACS.  Blood pressure has improved now 170 systolic.  Patient is asymptomatic.  Low suspicion for aortic pathology.  Unclear cause of her symptoms earlier but she has repeat lab work scheduled for later this week.  Return precaution discussed in detail.  Appropriate for discharge at this time [MP]    Clinical Course User Index [MP] Pamella Ozell LABOR, DO                                 Medical Decision Making 68 year old female with history as above presenting for atraumatic jaw pain and right scapular pain.  Pain lasted for 20 minutes earlier this morning while laying in bed.  Resolved  spontaneously.  Asymptomatic now.  Vital signs notable for significant hypertension with systolic in the 220s initially.  Improved to 180s after resting here.  Benign physical exam.  Differential diagnosis includes ACS, dysrhythmia, heart failure exacerbation, recurrent myocarditis.  Low suspicion for aortic dissection given that her pain has improved.  Low suspicion for PE given no tachycardia tachypnea or hypoxemia.  Will obtain laboratory workup chest x-ray EKG and continue to monitor.  Amount and/or Complexity of Data Reviewed Labs: ordered. Radiology: ordered.  Risk OTC drugs.        Final diagnoses:  Right-sided thoracic back pain, unspecified chronicity  Secondary hypertension    ED Discharge Orders     None          Pamella Ozell LABOR, DO 03/06/24 1337

## 2024-03-06 NOTE — Discharge Instructions (Signed)
 You were seen in the emerged apartment for pain in your back and pain in your jaw Your blood work looked okay Your kidney numbers were elevated but this is likely normal for you There is no evidence of heart attack on your EKG or blood work You will need to follow-up with your kidney team to have your blood work rechecked Return to the emergency room for trouble breathing chest pain or any other concerns Continue taking all previous prescribed occasions

## 2024-03-10 DIAGNOSIS — D649 Anemia, unspecified: Secondary | ICD-10-CM | POA: Diagnosis not present

## 2024-03-10 DIAGNOSIS — K219 Gastro-esophageal reflux disease without esophagitis: Secondary | ICD-10-CM | POA: Diagnosis not present

## 2024-03-10 DIAGNOSIS — N184 Chronic kidney disease, stage 4 (severe): Secondary | ICD-10-CM | POA: Diagnosis not present

## 2024-03-10 DIAGNOSIS — I129 Hypertensive chronic kidney disease with stage 1 through stage 4 chronic kidney disease, or unspecified chronic kidney disease: Secondary | ICD-10-CM | POA: Diagnosis not present

## 2024-03-10 DIAGNOSIS — E1122 Type 2 diabetes mellitus with diabetic chronic kidney disease: Secondary | ICD-10-CM | POA: Diagnosis not present

## 2024-03-12 DIAGNOSIS — I130 Hypertensive heart and chronic kidney disease with heart failure and stage 1 through stage 4 chronic kidney disease, or unspecified chronic kidney disease: Secondary | ICD-10-CM | POA: Diagnosis not present

## 2024-03-12 DIAGNOSIS — E782 Mixed hyperlipidemia: Secondary | ICD-10-CM | POA: Diagnosis not present

## 2024-03-12 DIAGNOSIS — E113211 Type 2 diabetes mellitus with mild nonproliferative diabetic retinopathy with macular edema, right eye: Secondary | ICD-10-CM | POA: Diagnosis not present

## 2024-03-12 DIAGNOSIS — J45909 Unspecified asthma, uncomplicated: Secondary | ICD-10-CM | POA: Diagnosis not present

## 2024-03-14 ENCOUNTER — Other Ambulatory Visit: Payer: Self-pay | Admitting: Internal Medicine

## 2024-03-14 DIAGNOSIS — I129 Hypertensive chronic kidney disease with stage 1 through stage 4 chronic kidney disease, or unspecified chronic kidney disease: Secondary | ICD-10-CM

## 2024-03-24 ENCOUNTER — Ambulatory Visit
Admission: RE | Admit: 2024-03-24 | Discharge: 2024-03-24 | Disposition: A | Discharge status: Home / Self Care | Source: Ambulatory Visit | Attending: Internal Medicine | Admitting: Internal Medicine

## 2024-03-24 DIAGNOSIS — N2889 Other specified disorders of kidney and ureter: Secondary | ICD-10-CM | POA: Diagnosis not present

## 2024-03-24 DIAGNOSIS — I10 Essential (primary) hypertension: Secondary | ICD-10-CM | POA: Diagnosis not present

## 2024-03-24 DIAGNOSIS — I129 Hypertensive chronic kidney disease with stage 1 through stage 4 chronic kidney disease, or unspecified chronic kidney disease: Secondary | ICD-10-CM

## 2024-03-24 DIAGNOSIS — R0600 Dyspnea, unspecified: Secondary | ICD-10-CM | POA: Diagnosis not present

## 2024-03-24 DIAGNOSIS — I130 Hypertensive heart and chronic kidney disease with heart failure and stage 1 through stage 4 chronic kidney disease, or unspecified chronic kidney disease: Secondary | ICD-10-CM | POA: Diagnosis not present

## 2024-03-24 DIAGNOSIS — M159 Polyosteoarthritis, unspecified: Secondary | ICD-10-CM | POA: Diagnosis not present

## 2024-03-24 DIAGNOSIS — N184 Chronic kidney disease, stage 4 (severe): Secondary | ICD-10-CM | POA: Diagnosis not present

## 2024-03-27 ENCOUNTER — Other Ambulatory Visit (HOSPITAL_COMMUNITY): Payer: Self-pay | Admitting: Family Medicine

## 2024-03-27 DIAGNOSIS — R0600 Dyspnea, unspecified: Secondary | ICD-10-CM

## 2024-03-29 NOTE — Progress Notes (Unsigned)
 Cardiology Office Note    Date:  03/30/2024  ID:  Shelly [name], DOB 06-26-1956, MRN [id number] PCP:  Loreli Kins, MD  Cardiologist:  None - New (remotely Dr. Lavona)  Chief Complaint: HTN, CHF  History of Present Illness: .    Shelly [name] is a 68 y.o. female with visit-pertinent history of NICM, chronic HFmrEF, mitral regurgitation, aortic stenosis, HTN, HLD managed by PCP, gastric adenoma, CKD stage IV, anemia by labs, DM, diabetic retinopathy, acid reflux, anxiety, obesity, arthritis, migraines, IBS, endometriosis, melanoma s/p excision seen for evaluation of dyspnea at the request of Dr. Loreli. She was remotely seen by our team when she initially developed acute HFrEF in 2015 with EF 25-30% with moderate-severe MR of unclear etiology. Cath showed no significant CAD, LVEF 20%, moderate pHTN. She was diuresed and started on GDMT. F/u echo 2016 showed EF 50-55%. The patient tells me she was told perhaps she had had a virus that attacked her heart. She does not recall being sick before the diagnosis. She was last seen by cardiology in 04/2015, on carvedilol  6.25mg  BID, Lasix  40mg  BID, losartan  150mg  daily, spironolactone  25mg  by last OV. Last echo 2021 showed EF 45-50%, G1DD, elevated LVEDP moderate LAE, mild AS, trivial MR. Blood pressure at 10/2023 OV with PCP was recorded at 120/82. Over the years she has also lost a significant amount of weight.  In June she reports she was being managed for melanoma excision and was noted to have marked HTN with SBP 245. She got in touch with her PCP and reports carvedilol  was increased to 25mg  BID. She recalls having a nurse visit in that same month showing SBP 210-220 systolic. She reports that somehow this information did not get communicated to PCP so she ended up being evaluated for this eventually in August. She was seen in ED 02/2024 for atypical jaw pain/right scapular pain with low level troponin elevation in setting of marked HTN. Symptoms  resolved and able to be discharged home. She reports 2 weeks ago her nephrologist added hydralazine  25mg  TID ordered further workup. Her PCP also started clonidine patch but she just picked up the prescription and placed this yesterday. Renal artery duplex showed no significant renal artery stenosis. She reports she was told there may be a tumor on her kidneys contributing to her high blood pressure related to an abnormal blood level, and she is pending an MRI to further evaluate this (?pheo eval, had high plasma norepinephrine level of 891). Recent labs are outlined below. Over the past few months she has noticed some mild dyspnea on exertion but prior to recent events was working out 40 minutes a day. She states she was advised by her other physicians to limit strenuous activity until her blood pressure is better controlled. Without her daily workout she has begun to put on some weight back. She has not had angina. She has been having hot flashes and headaches. Home BPs prior to addition of clonidine patch ~170s/90s. No significant edema, angina or orthopnea. Here, BP 190/80 with recheck 170/90.   Family History: multiple family members with various heart disease - maternal grandmother had hardening of the arteries/HTN, father's parents had diabetes, cardiac disease of some kind, father had MI Tobacco: none Alcohol: rare Drug use: none  Labwork independently reviewed: 03/10/24 [facility] DKA by nephrology plasma norepinephrine elevated to 891, epinephrine/dopamine reported within normal limits, plasma normetanephrine and metanephrine reported WNL, PRA 0.635, aldosterone 1.3, cortisol low at 5.8, TSH wnl, Cr 2.34, UA  negative protein, TSH OK, Cr 2.34 03/06/24 low level trops, Mg 1.8, Cr 2.12, alb 2.7, K 4.9, AST ALT OK, BNP 167, Hgb 10.8, plt 233   ROS: .    Please see the history of present illness. All other systems are reviewed and otherwise negative.  Studies Reviewed: SABRA    EKG:  EKG is not  ordered today but reviewed from recent tracing 03/06/24 NSR 68bpm, rightward axis, nonspecific STTW changes inferiorly and V3-V6 - had nonspecific STTW changes III, avF in 2021 but otherwise these changes are new from prior  CV Studies: Cardiac studies reviewed are outlined and summarized above. Otherwise please see EMR for full report.   Current Reported Medications:.    Current Meds  Medication Sig   albuterol  (PROVENTIL ) (2.5 MG/3ML) 0.083% nebulizer solution Take 3 mLs (2.5 mg total) by nebulization every 6 (six) hours as needed for wheezing or shortness of breath.   ALPRAZolam  (XANAX ) 0.5 MG tablet Take 0.5 mg by mouth 3 (three) times daily as needed. Anxiety   carvedilol  (COREG ) 25 MG tablet Take 25 mg by mouth 2 (two) times daily with a meal.   cloNIDine (CATAPRES - DOSED IN MG/24 HR) 0.2 mg/24hr patch Place 0.2 mg onto the skin once a week.   cloNIDine (CATAPRES) 0.2 MG tablet Take 0.2 mg by mouth daily.   COMBIVENT RESPIMAT 20-100 MCG/ACT AERS respimat Take 1 puff by mouth daily.   doxazosin (CARDURA) 1 MG tablet Take 1 mg by mouth daily.   ezetimibe (ZETIA) 10 MG tablet Take 10 mg by mouth daily.   fluticasone  (FLONASE ) 50 MCG/ACT nasal spray Place 1 spray into both nostrils daily as needed. For allergy nasal congestion   hydrALAZINE  (APRESOLINE ) 25 MG tablet Take 25 mg by mouth 3 (three) times daily.   HYDROcodone -acetaminophen  (NORCO/VICODIN) 5-325 MG tablet Take 1 tablet by mouth every 6 (six) hours as needed.   promethazine  (PHENERGAN ) 25 MG tablet Take 1 tablet (25 mg total) by mouth daily as needed.   [DISCONTINUED] carvedilol  (COREG ) 25 MG tablet Take 1 tablet by mouth daily at 8 pm.  (Patient taking differently: Take 1 tablet by mouth 2 (two) times daily with a meal.)   [DISCONTINUED] cloNIDine (CATAPRES) 0.2 MG tablet Take 0.2 mg by mouth 2 (two) times daily. (Patient taking differently: Take 0.2 mg by mouth daily.)    Physical Exam:    VS:  BP (!) 170/90   Pulse 84    Ht 5' 2 (1.575 m)   Wt 185 lb (83.9 kg)   SpO2 96%   BMI 33.84 kg/m    Wt Readings from Last 3 Encounters:  03/30/24 185 lb (83.9 kg)  03/06/24 163 lb (73.9 kg)  03/26/20 239 lb (108.4 kg)    GEN: Well nourished, well developed in no acute distress NECK: No JVD; No carotid bruits CARDIAC: RRR, no murmurs, rubs, gallops RESPIRATORY:  Clear to auscultation without rales, wheezing or rhonchi  ABDOMEN: Soft, non-tender, non-distended EXTREMITIES:  No edema; No acute deformity   Asessement and Plan:.    1. Uncontrolled HTN - unclear precipitant. We do not have access to all the interim trends since last cardiology OV in 2016, but per report, BP was normal in 10/2023 then abruptly increased beginning 12/2023 onward. This has been co-managed by PCP and nephrology recently. Dr. Norine ordered multiple labs for evaluation. Recent renal artery duplex, TSH, UA, aldosterone level unrevealing.  Plasma catecholamines showed elevated norepinephrine - the patient reports she is awaiting for their office  to arrange MRI of her kidneys, unclear if perhaps awaiting evaluation for pheochromocytoma. I have asked our staff to request a copy of nephrology's last note. It is not known to me at this time if the labs were drawn with indwelling catheter or venipuncture. I will defer interpretation of the metanephrines/catecholamines to the ordering provider, especially in the context of her renal disease. Her CKD may certainly make management more complicated. From a cardiac standpoint we will await her echocardiogram to help guide next steps. I have asked her to have her PCP send us  a copy when this results as it was ordered by Dr. Loreli. She is presently on carvedilol  25mg  BID, doxazosin 1mg  daily, hydralazine  25mg  TID, clonidine 0.2mg  daily and just started clonidine patch yesterday. Patient confirms that primary care had intended for both forms of clonidine. She would like for us  and Dr. Norine to manage her blood pressure  moving forward. I have asked her to please send us  a copy of her blood pressure readings in 1 week for further review. Titration of hydralazine  would probably be the next step. With her recent renal trajectory, would avoid ACEI/ARB/ARNI/spironolactone . She is reported to have an intolerance to amlodipine  in the past with fatigue and nausea. Echocardiogram will also help guide med recommendations.  2. Dyspnea with history of chronic HFmrEF/NICM and pulmonary HTN at time of initial diagnosis - await echocardiogram before advising further recommendations. Original etiology of cardiomyopathy unclear, possibly viral, though her blood pressure was also elevated at time of original diagnosis.  3. Mitral regurgitation, aortic stenosis - await echocardiogram as above.    Disposition: Given complexity of HTN and CKD, will refer to our advanced HTN clinic.   Signed, Lasya Vetter N Jamonta Goerner, PA-C

## 2024-03-29 NOTE — Telephone Encounter (Signed)
 Faxed records  for [facility] 05 08 25, 12-15-23 & 12-29-23 along with pathology reports for [facility]  11-18-23 & 12-15-23 were faxed to [facility] fax   [phone], records faxed via [email or web address].

## 2024-03-30 ENCOUNTER — Encounter: Payer: Self-pay | Admitting: Physician Assistant

## 2024-03-30 ENCOUNTER — Ambulatory Visit: Attending: Physician Assistant | Admitting: Physician Assistant

## 2024-03-30 VITALS — BP 170/90 | HR 84 | Ht 62.0 in | Wt 185.0 lb

## 2024-03-30 DIAGNOSIS — I350 Nonrheumatic aortic (valve) stenosis: Secondary | ICD-10-CM

## 2024-03-30 DIAGNOSIS — I340 Nonrheumatic mitral (valve) insufficiency: Secondary | ICD-10-CM

## 2024-03-30 DIAGNOSIS — I5022 Chronic systolic (congestive) heart failure: Secondary | ICD-10-CM | POA: Diagnosis not present

## 2024-03-30 DIAGNOSIS — R0600 Dyspnea, unspecified: Secondary | ICD-10-CM

## 2024-03-30 DIAGNOSIS — I10 Essential (primary) hypertension: Secondary | ICD-10-CM | POA: Diagnosis not present

## 2024-03-30 NOTE — Patient Instructions (Signed)
 Medication Instructions:  Your physician recommends that you continue on your current medications as directed. Please refer to the Current Medication list given to you today.  *If you need a refill on your cardiac medications before your next appointment, please call your pharmacy*  Lab Work: None ordered  If you have labs (blood work) drawn today and your tests are completely normal, you will receive your results only by: MyChart Message (if you have MyChart) OR A paper copy in the mail If you have any lab test that is abnormal or we need to change your treatment, we will call you to review the results.  Testing/Procedures: None ordered   You have been referred to DR. Buchanan FOR HTN Follow-Up: At [facility], you and your health needs are our priority.  As part of our continuing mission to provide you with exceptional heart care, our providers are all part of one team.  This team includes your primary Cardiologist (physician) and Advanced Practice Providers or APPs (Physician Assistants and Nurse Practitioners) who all work together to provide you with the care you need, when you need it.  Your next appointment:    Provider:        We recommend signing up for the patient portal called MyChart.  Sign up information is provided on this After Visit Summary.  MyChart is used to connect with patients for Virtual Visits (Telemedicine).  Patients are able to view lab/test results, encounter notes, upcoming appointments, etc.  Non-urgent messages can be sent to your provider as well.   To learn more about what you can do with MyChart, go to [email or web address].   Other Instructions

## 2024-03-31 DIAGNOSIS — I129 Hypertensive chronic kidney disease with stage 1 through stage 4 chronic kidney disease, or unspecified chronic kidney disease: Secondary | ICD-10-CM | POA: Diagnosis not present

## 2024-04-03 ENCOUNTER — Encounter (HOSPITAL_BASED_OUTPATIENT_CLINIC_OR_DEPARTMENT_OTHER): Payer: Self-pay | Admitting: Cardiovascular Disease

## 2024-04-03 ENCOUNTER — Other Ambulatory Visit: Payer: Self-pay | Admitting: Internal Medicine

## 2024-04-03 ENCOUNTER — Ambulatory Visit (HOSPITAL_BASED_OUTPATIENT_CLINIC_OR_DEPARTMENT_OTHER): Admitting: Cardiovascular Disease

## 2024-04-03 VITALS — BP 180/88 | HR 67 | Resp 17 | Ht 62.0 in | Wt 180.0 lb

## 2024-04-03 DIAGNOSIS — E782 Mixed hyperlipidemia: Secondary | ICD-10-CM

## 2024-04-03 DIAGNOSIS — I5022 Chronic systolic (congestive) heart failure: Secondary | ICD-10-CM

## 2024-04-03 DIAGNOSIS — I700 Atherosclerosis of aorta: Secondary | ICD-10-CM

## 2024-04-03 DIAGNOSIS — E781 Pure hyperglyceridemia: Secondary | ICD-10-CM | POA: Diagnosis not present

## 2024-04-03 DIAGNOSIS — I10 Essential (primary) hypertension: Secondary | ICD-10-CM

## 2024-04-03 DIAGNOSIS — I129 Hypertensive chronic kidney disease with stage 1 through stage 4 chronic kidney disease, or unspecified chronic kidney disease: Secondary | ICD-10-CM

## 2024-04-03 MED ORDER — DOXAZOSIN MESYLATE 2 MG PO TABS: 2.0000 mg | ORAL_TABLET | Freq: Every day | ORAL | 3 refills | Status: DC

## 2024-04-03 MED ORDER — CLONIDINE HCL 0.1 MG PO TABS: 0.1000 mg | ORAL_TABLET | Freq: Two times a day (BID) | ORAL | 3 refills | Status: DC

## 2024-04-03 NOTE — Progress Notes (Signed)
 [facility] Initial Assessment:    Date:  04/03/2024   ID:  [name], DOB [birth year], MRN [id number]  PCP:  Shelly Kins, MD  Cardiologist:  None  Nephrologist:  Referring MD: Shelly Raphael SAILOR, PA-C   CC: Hypertension  History of Present Illness:    Shelly [name] is a 68 y.o. female with a hx of HFmrEF, NICM, hypertension, HL, aortic stenosis, stage IV CKD, gastric adenoma, DM, and anemia here to establish care in the [facility].  She developed HFrEF in 2015.  LEF 25-30% with moderate-severe MR.  No significant CAD on cath.  She started on GDMT with improvement in LVEF 50-55%.  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  LVEF worsened to 45-50% in 2021.    BP was noted to be SBP 245 in the setting of a melanoma excision.  Carvedilol  was increased and BP remained very elevated.  Her nephrologist added hydralazine .  PCP starterd a clonidine  patch the day before she saw Shelly Dunn, PA-C in clinic 03/30/24.  Renal Doppers were negative.  Home BP was in the 170s.90 at the tine,  Per Shelly's note:  03/10/24 [facility] DKA by nephrology plasma norepinephrine elevated to 891, epinephrine/dopamine reported within normal limits, plasma normetanephrine and metanephrine reported WNL, PRA 0.635, aldosterone 1.3, cortisol low at 5.8, TSH wnl, Cr 2.34, UA negative protein, TSH OK, Cr 2.34 03/06/24 low level trops, Mg 1.8, Cr 2.12, alb 2.7, K 4.9, AST ALT OK, BNP 167, Hgb 10.8, plt 233  Shelly [name] reported exertional dyspnea and fatigue.  An echo was ordered and not yet performed.  Discussed the use of AI scribe software for clinical note transcription with the patient, who gave verbal consent to proceed.  History of Present Illness Shelly [name] notes that her blood pressure, previously well-controlled at 120/70 mmHg, has increased to around 170/90 mmHg over the past two months. She experiences severe cramps in her thighs and feet, which affect her ability  to work out, although she maintains a routine of walking 12,000 steps a day. She also reports dizziness and off-balance sensations, leading to a temporary cessation of weight training.  She has experienced a significant increase in anxiety over the past two months, coinciding with the rise in blood pressure. She feels tense and has difficulty sleeping due to racing thoughts. She takes Xanax  mid-morning, mid-afternoon, and before bed, totaling about two pills a day, to manage her anxiety.  Her current medications for blood pressure include carvedilol  25 mg twice a day, clonidine  patch, clonidine  pill at night, and doxazosin , totaling seven pills a day. She uses hydrocodone  for pain management, particularly for shoulder cramps, and avoids NSAIDs.  She has a history of multiple abdominal surgeries, including the removal of a large adenoma from her stomach, and has lost 19 inches of her intestines due to rupture from scar tissue. She has high cholesterol, which recently increased from 180 to 245 mg/dL despite a healthy diet. She follows a high-protein diet with minimal salt and caffeine intake.  Family history is significant for cardiovascular issues, including her mother who died of multiple strokes and congestive heart failure, and her father who had four heart attacks. Both paternal grandparents died in their fifties from heart-related issues.  She has undergone a heart catheterization in the past, which showed no major blockages. She has a history of heart failure, which resolved after three months, and was attributed to a possible viral cause. She reports that an MRI has  been recommended to further evaluate her adrenal glands after elevated norepinephrine levels were found, but it has not yet been scheduled. She has recently completed a 24-hour urine collection.   Previous antihypertensives: Amlodipine  Lisinopril   Past Medical History:  Diagnosis Date   Acid reflux    Anemia    Anxiety     Arthritis    Asthma    infrequent problems   Cardiomyopathy (HCC)    CHF (congestive heart failure) (HCC)    Chronic UTI (urinary tract infection)    Complication of anesthesia    I quit breathing during surgery 2008 - has had surgery since with no problems    Diabetes mellitus    Endometriosis    hx of    Headache    hx of migraines   Hepatitis B    hx of dx during ruptured appendix hospitalization   Hyperlipemia    Hypertension    IBS (irritable bowel syndrome)    Irritable bowel syndrome    Kidney disease    Obesity    Pneumonia 12/2013   Pneumonia    PONV (postoperative nausea and vomiting)    Rash    sides of neck   Stress incontinence    Tuberculosis 1985    Past Surgical History:  Procedure Laterality Date   ABDOMINAL ADHESION SURGERY  1989   x 7   ABDOMINAL HYSTERECTOMY  01/1996   APPENDECTOMY  1987   BREAST BIOPSY Right 05/18/2022   MM RT BREAST BX W LOC DEV 1ST LESION IMAGE BX SPEC STEREO GUIDE 05/18/2022 [facility]   CHOLECYSTECTOMY  11/24/2000   COLON SURGERY  2010   colon resection due to adhesions   LAPAROSCOPIC LYSIS OF ADHESIONS  07/1991   pelvic adhesions   LEFT AND RIGHT HEART CATHETERIZATION WITH CORONARY ANGIOGRAM N/A 06/29/2014   Procedure: LEFT AND RIGHT HEART CATHETERIZATION WITH CORONARY ANGIOGRAM;  Surgeon: Candyce GORMAN Reek, MD;  Location: [facility] CATH LAB;  Service: Cardiovascular;  Laterality: N/A;   OOPHORECTOMY Right 04/1995   OOPHORECTOMY Left 12/1992   removed left ovary   PARTIAL KNEE ARTHROPLASTY Left 10/27/2013   Procedure: LEFT KNEE UNICOMPARTMENTAL REPLACEMENT ;  Surgeon: Dempsey LULLA Moan, MD;  Location: [facility];  Service: Orthopedics;  Laterality: Left;   PARTIAL KNEE ARTHROPLASTY Right 05/21/2014   Procedure: MEDIAL UNICOMPARTMENTAL RIGHT KNEE ARTHROPLASTY;  Surgeon: Dempsey Moan LULLA, MD;  Location: [facility];  Service: Orthopedics;  Laterality: Right;   TONSILLECTOMY      Current Medications: Current Meds  Medication Sig    albuterol  (PROVENTIL ) (2.5 MG/3ML) 0.083% nebulizer solution Take 3 mLs (2.5 mg total) by nebulization every 6 (six) hours as needed for wheezing or shortness of breath.   ALPRAZolam  (XANAX ) 0.5 MG tablet Take 0.5 mg by mouth 3 (three) times daily as needed. Anxiety   carvedilol  (COREG ) 25 MG tablet Take 25 mg by mouth 2 (two) times daily with a meal.   cloNIDine  (CATAPRES  - DOSED IN MG/24 HR) 0.2 mg/24hr patch Place 0.2 mg onto the skin once a week.   COMBIVENT RESPIMAT 20-100 MCG/ACT AERS respimat Take 1 puff by mouth daily.   ezetimibe (ZETIA) 10 MG tablet Take 10 mg by mouth daily.   fluticasone  (FLONASE ) 50 MCG/ACT nasal spray Place 1 spray into both nostrils daily as needed. For allergy nasal congestion   hydrALAZINE  (APRESOLINE ) 25 MG tablet Take 25 mg by mouth 3 (three) times daily.   HYDROcodone -acetaminophen  (NORCO/VICODIN) 5-325 MG tablet Take 1 tablet by mouth every 6 (  six) hours as needed.   promethazine  (PHENERGAN ) 25 MG tablet Take 1 tablet (25 mg total) by mouth daily as needed.   [DISCONTINUED] cloNIDine  (CATAPRES ) 0.2 MG tablet Take 0.2 mg by mouth daily.   [DISCONTINUED] doxazosin  (CARDURA ) 1 MG tablet Take 1 mg by mouth daily.     Allergies:   Singulair  [montelukast  sodium], Amlodipine  besylate, Atorvastatin, Lisinopril, Metformin, Penicillins, Sulfa antibiotics, Welchol  [colesevelam hcl], Dilaudid  [hydromorphone  hcl], and Sulfamethoxazole-trimethoprim   Social History   Socioeconomic History   Marital status: Divorced    Spouse name: Not on file   Number of children: Not on file   Years of education: Not on file   Highest education level: Not on file  Occupational History   Not on file  Tobacco Use   Smoking status: Never   Smokeless tobacco: Never  Substance and Sexual Activity   Alcohol use: Yes    Comment: occ   Drug use: No   Sexual activity: Not on file  Other Topics Concern   Not on file  Social History Narrative   Not on file   Social Drivers of  Health   Financial Resource Strain: Not on file  Food Insecurity: Low Risk  (01/12/2024)   Received from [facility]   Hunger Vital Sign    Within the past 12 months, you worried that your food would run out before you got money to buy more: Never true    Within the past 12 months, the food you bought just didn't last and you didn't have money to get more. : Never true  Transportation Needs: No Transportation Needs (01/12/2024)   Received from [facility]    In the past 12 months, has lack of reliable transportation kept you from medical appointments, meetings, work or from getting things needed for daily living? : No  Physical Activity: Not on file  Stress: Not on file  Social Connections: Not on file     Family History: The patient's family history includes Diabetes in her father; Heart attack in her maternal grandmother and paternal grandmother; Heart disease in her father; Heart failure in her mother; Hypertension in her maternal grandmother and mother; Liver cancer in her brother; Stroke in her mother. There is no history of Breast cancer.  ROS:   Please see the history of present illness.     All other systems reviewed and are negative.  EKGs/Labs/Other Studies Reviewed:    EKG:  EKG is no ordered today.   Recent Labs: 03/06/2024: ALT 11; B Natriuretic Peptide 167.9; BUN 60; Creatinine, Ser 2.12; Hemoglobin 10.8; Magnesium  1.8; Platelets 233; Potassium 4.9; Sodium 136   Recent Lipid Panel No results found for: CHOL, TRIG, HDL, CHOLHDL, VLDL, LDLCALC, LDLDIRECT  Physical Exam:   VS:  BP (!) 180/88 (BP Location: Left Arm, Patient Position: Sitting, Cuff Size: Large)   Pulse 67   Resp 17   Ht 5' 2 (1.575 m)   Wt 180 lb (81.6 kg)   SpO2 94%   BMI 32.92 kg/m  , BMI Body mass index is 32.92 kg/m. GENERAL:  Well appearing HEENT: Pupils equal round and reactive, fundi not visualized, oral mucosa unremarkable NECK:  No jugular venous  distention, waveform within normal limits, carotid upstroke brisk and symmetric, no bruits, no thyromegaly LUNGS:  Clear to auscultation bilaterally HEART:  RRR.  PMI not displaced or sustained,S1 and S2 within normal limits, no S3, no S4, no clicks, no rubs, no murmurs ABD:  Flat, positive  bowel sounds normal in frequency in pitch, no bruits, no rebound, no guarding, no midline pulsatile mass, no hepatomegaly, no splenomegaly EXT:  2 plus pulses throughout, no edema, no cyanosis no clubbing SKIN:  No rashes no nodules NEURO:  Cranial nerves II through XII grossly intact, motor grossly intact throughout PSYCH:  Cognitively intact, oriented to person place and time   ASSESSMENT/PLAN:    Assessment & Plan # Resistant hypertension with stage 4 chronic kidney disease Hypertension difficult to control, recent readings 170/90 mmHg. Stage 4 CKD limits medication options. Current regimen includes carvedilol , clonidine  patch, doxazosin , and hydralazine . BP control critical to prevent further kidney damage. - Increase clonidine  pill to 0.1 mg twice daily. - Increase doxazosin  to 2 mg twice daily. - Continue carvedilol  25 mg twice daily. - Continue hydralazine  25 mg three times daily.  This can be increased as needed. - Maintain clonidine  patch at 0.2 mg.  She just purchased these patches and they are expensive so we will not change it for now.  Consider higher dose at the time of refill. - Continue lifestyle modifications including low salt diet and exercise as tolerated.  # Evaluation for secondary hypertension (possible pheochromocytoma) She is already undergoing the appropriate evaluation with nephrology.  Elevated norepinephrine levels noted, not typical for pheochromocytoma. MRI of adrenal glands pending. 24-hour urine collection completed. - Await results of 24-hour urine collection. - Scheduled for MRI of adrenal glands.  # HFmrEF:  Heart failure with previous catheterization showing no  major blockages. 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  Current symptoms include fatigue and anxiety, possibly exacerbated by uncontrolled hypertension.  Need to discuss SGLT2 inhibitor at follow-up.  GDMT limited by chronic kidney disease. - Continue current heart failure management.  # Aortic valve stenosis Aortic stenosis murmur noted, likely mild. - Proceed with echocardiogram on October 26 to evaluate aortic stenosis.  # Mixed hyperlipidemia Recent cholesterol levels elevated at 245 mg/dL, previously well-managed at 180 mg/dL. Dietary factors considered, including high shrimp intake. Fasting status confirmed during last test. - Continue Zetia for cholesterol management. - Recheck lipid panel at next appointment in October.  Will make decisions on medication management based on this.  She may benefit from a calcium  score as well.  # Generalized anxiety symptoms Anxiety symptoms increased, possibly related to uncontrolled hypertension and life stressors. Current regimen includes Xanax  as needed, but she prefers to minimize medication use.  Recommend discussing nonbenzodiazepine options for management of her anxiety, especially given that she is using it so frequently. - Encourage non-pharmacological interventions such as therapy, meditation and yoga.    Screening for Secondary Hypertension:     04/03/2024    8:27 AM  Causes  Drugs/Herbals Screened     - Comments limits salt.  1 coffee daily.  No NSAIDS.  Renovascular HTN Screened  Sleep Apnea Screened     - Comments no snoring or daytime somnolence  Thyroid  Disease Screened  Hyperaldosteronism Screened  Pheochromocytoma Screened     - Comments further evaluation pending with MRI and urine labs  Cushing's Syndrome Screened  Hyperparathyroidism Screened  Coarctation of the Aorta Screened     - Comments BP symmetric  Compliance Screened    Relevant Labs/Studies:    Latest Ref Rng & Units  03/06/2024    8:42 AM 03/06/2024    6:52 AM 01/06/2018   12:20 PM  Basic Labs  Sodium 135 - 145 mmol/L 136  137  138   Potassium 3.5 - 5.1  mmol/L 4.9  5.1  4.0   Creatinine 0.44 - 1.00 mg/dL 7.87  7.80  8.82        Latest Ref Rng & Units 01/06/2018   12:20 PM  Thyroid    TSH 0.35 - 4.50 uIU/mL 1.11                     Disposition:    FU with MD/PharmD in 3 months    Medication Adjustments/Labs and Tests Ordered: Current medicines are reviewed at length with the patient today.  Concerns regarding medicines are outlined above.  No orders of the defined types were placed in this encounter.  Meds ordered this encounter  Medications   cloNIDine  (CATAPRES ) 0.1 MG tablet    Sig: Take 1 tablet (0.1 mg total) by mouth 2 (two) times daily.    Dispense:  180 tablet    Refill:  3    NEW DOSE, D/C 0.2 MG RX   doxazosin  (CARDURA ) 2 MG tablet    Sig: Take 1 tablet (2 mg total) by mouth daily.    Dispense:  180 tablet    Refill:  3    NEW DOSE, D/C 1 MG RX     Signed, Annabella Scarce, MD  04/03/2024 9:24 AM    [facility]

## 2024-04-03 NOTE — Patient Instructions (Addendum)
 Medication Instructions:  CHANGE CLONIDINE  TO 0.1 MG TWICE A DAY, CONTINUE THE PATCH   INCREASE YOUR DOXAZOSIN  TO 2 MG TWICE A DAY   Labwork: NONE  Testing/Procedures: NONE  Follow-Up: 3 months with Dr Raford, Shelly [name], or Shelly ORN NP   Any Other Special Instructions Will Be Listed Below (If Applicable).     If you need a refill on your cardiac medications before your next appointment, please call your pharmacy.

## 2024-04-03 NOTE — Telephone Encounter (Signed)
 HTN clinic note reviewed. Very much appreciated. Can we please schedule f/u with me after echo to discuss results in 4-6 weeks?

## 2024-04-11 DIAGNOSIS — J45909 Unspecified asthma, uncomplicated: Secondary | ICD-10-CM | POA: Diagnosis not present

## 2024-04-11 DIAGNOSIS — E113211 Type 2 diabetes mellitus with mild nonproliferative diabetic retinopathy with macular edema, right eye: Secondary | ICD-10-CM | POA: Diagnosis not present

## 2024-04-11 DIAGNOSIS — I130 Hypertensive heart and chronic kidney disease with heart failure and stage 1 through stage 4 chronic kidney disease, or unspecified chronic kidney disease: Secondary | ICD-10-CM | POA: Diagnosis not present

## 2024-04-11 DIAGNOSIS — E782 Mixed hyperlipidemia: Secondary | ICD-10-CM | POA: Diagnosis not present

## 2024-04-13 ENCOUNTER — Ambulatory Visit
Admission: RE | Admit: 2024-04-13 | Discharge: 2024-04-13 | Disposition: A | Discharge status: Home / Self Care | Source: Ambulatory Visit | Attending: Internal Medicine | Admitting: Internal Medicine

## 2024-04-13 DIAGNOSIS — I129 Hypertensive chronic kidney disease with stage 1 through stage 4 chronic kidney disease, or unspecified chronic kidney disease: Secondary | ICD-10-CM

## 2024-04-13 MED ORDER — GADOPICLENOL 0.5 MMOL/ML IV SOLN
10.0000 mL | Freq: Once | INTRAVENOUS | Status: AC | PRN
  Administered 2024-04-13: 8 mL via INTRAVENOUS

## 2024-04-24 DIAGNOSIS — D649 Anemia, unspecified: Secondary | ICD-10-CM | POA: Diagnosis not present

## 2024-04-24 DIAGNOSIS — N184 Chronic kidney disease, stage 4 (severe): Secondary | ICD-10-CM | POA: Diagnosis not present

## 2024-04-24 DIAGNOSIS — K219 Gastro-esophageal reflux disease without esophagitis: Secondary | ICD-10-CM | POA: Diagnosis not present

## 2024-04-24 DIAGNOSIS — I129 Hypertensive chronic kidney disease with stage 1 through stage 4 chronic kidney disease, or unspecified chronic kidney disease: Secondary | ICD-10-CM | POA: Diagnosis not present

## 2024-04-24 DIAGNOSIS — E1122 Type 2 diabetes mellitus with diabetic chronic kidney disease: Secondary | ICD-10-CM | POA: Diagnosis not present

## 2024-05-03 ENCOUNTER — Ambulatory Visit (HOSPITAL_COMMUNITY)
Admission: RE | Admit: 2024-05-03 | Discharge: 2024-05-03 | Disposition: A | Discharge status: Home / Self Care | Source: Ambulatory Visit | Attending: Cardiology | Admitting: Cardiology

## 2024-05-03 DIAGNOSIS — R0600 Dyspnea, unspecified: Secondary | ICD-10-CM | POA: Insufficient documentation

## 2024-05-03 LAB — ECHOCARDIOGRAM COMPLETE
AR max vel: 1.66 cm2
AV Area VTI: 1.61 cm2
AV Area mean vel: 1.58 cm2
AV Mean grad: 17 mmHg
AV Peak grad: 29.5 mmHg
Ao pk vel: 2.72 m/s
Area-P 1/2: 2.73 cm2
S' Lateral: 4.4 cm

## 2024-05-04 DIAGNOSIS — K5900 Constipation, unspecified: Secondary | ICD-10-CM | POA: Diagnosis not present

## 2024-05-04 DIAGNOSIS — N3281 Overactive bladder: Secondary | ICD-10-CM | POA: Diagnosis not present

## 2024-05-05 DIAGNOSIS — I2721 Secondary pulmonary arterial hypertension: Secondary | ICD-10-CM | POA: Diagnosis not present

## 2024-05-05 DIAGNOSIS — J45909 Unspecified asthma, uncomplicated: Secondary | ICD-10-CM | POA: Diagnosis not present

## 2024-05-05 DIAGNOSIS — N184 Chronic kidney disease, stage 4 (severe): Secondary | ICD-10-CM | POA: Diagnosis not present

## 2024-05-05 DIAGNOSIS — Z23 Encounter for immunization: Secondary | ICD-10-CM | POA: Diagnosis not present

## 2024-05-05 DIAGNOSIS — E782 Mixed hyperlipidemia: Secondary | ICD-10-CM | POA: Diagnosis not present

## 2024-05-05 DIAGNOSIS — K219 Gastro-esophageal reflux disease without esophagitis: Secondary | ICD-10-CM | POA: Diagnosis not present

## 2024-05-05 DIAGNOSIS — I130 Hypertensive heart and chronic kidney disease with heart failure and stage 1 through stage 4 chronic kidney disease, or unspecified chronic kidney disease: Secondary | ICD-10-CM | POA: Diagnosis not present

## 2024-05-05 DIAGNOSIS — E1122 Type 2 diabetes mellitus with diabetic chronic kidney disease: Secondary | ICD-10-CM | POA: Diagnosis not present

## 2024-05-05 DIAGNOSIS — M159 Polyosteoarthritis, unspecified: Secondary | ICD-10-CM | POA: Diagnosis not present

## 2024-05-12 NOTE — Progress Notes (Deleted)
 Cardiology Office Note    Date:  05/12/2024  ID:  Shelly [name], DOB 06-Nov-1955, MRN [id number] PCP:  Loreli Kins, MD  Cardiologist:  None  Electrophysiologist:  None   Chief Complaint: ***  History of Present Illness: .    Shelly [name] is a 68 y.o. female with visit-pertinent history of NICM, chronic HFmrEF, mitral regurgitation, aortic stenosis, HTN, HLD managed by PCP, gastric adenoma, CKD stage IV, anemia by labs, DM, diabetic retinopathy, acid reflux, anxiety, obesity, arthritis, migraines, IBS, endometriosis, melanoma s/p excision.  She was remotely seen by our team when she initially developed acute HFrEF in 2015 with EF 25-30% with moderate-severe MR of unclear etiology. Cath showed no significant CAD, LVEF 20%, moderate pHTN. She was diuresed and started on GDMT. F/u echo 2016 showed EF 50-55%. The patient tells me she was told perhaps she had had a virus that attacked her heart, did not recall being sick before the diagnosis. 2021 echo showed EF 45-50%, G1DD, elevated LVEDP moderate LAE, mild AS, trivial MR. Over the years she has also lost a significant amount of weight. She was previously on carvedilol , Lasix , losartan , spironolactone  when followed in 2016.  She recently re-established care in 03/2024 for severe HTN and dyspnea - see full note for details. She has also been following with nephrology for progressive renal insufficiency. Renal artery duplex 03/2024 showed no significant renal artery stenosis. She reported she was told there may be a tumor on her kidneys contributing to her high blood pressure and abnormal labwork (?pheo eval, had high plasma norepinephrine level of 891 by nephrology). Subsequent MR abdomen 04/13/24 showed no suspicions renal lesions or adrenal lesions. At our visit she presented on carvedilol , doxazosin , hydralazine , and recently started on clonidine  patch in addition to clonidine  patch by PCP. I ordered echo and arranged close f/u with [facility]/Dr. Raford who increased her clonidine  and doxazosin . Due to the recent purchase of clonidine  patches, this was maintained along with oral clonidine  and 3 month f/u. 2D echo showed  EF 55%, mild LVH, G1DD, normal RV, mild-moderate MR, mild AS, dilated IVC.  Essential HTN Dyspnea Chronic HFmrEF/NICM with pulmonary HTN at time of initial dx Mild-moderate MR, mild AS  Labwork independently reviewed: 03/10/24 [facility] DKA by nephrology plasma norepinephrine elevated to 891, epinephrine/dopamine reported within normal limits, plasma normetanephrine and metanephrine reported WNL, PRA 0.635, aldosterone 1.3, cortisol low at 5.8, TSH wnl, Cr 2.34, UA negative protein, TSH OK, Cr 2.34 03/06/24 low level trops, Mg 1.8, Cr 2.12, alb 2.7, K 4.9, AST ALT OK, BNP 167, Hgb 10.8, plt 233  ROS: .    Please see the history of present illness. Otherwise, review of systems is positive for ***.  All other systems are reviewed and otherwise negative.  Studies Reviewed: SABRA    EKG:  EKG is ordered today, personally reviewed, demonstrating ***  CV Studies: Cardiac studies reviewed are outlined and summarized above. Otherwise please see EMR for full report.   Current Reported Medications:.    No outpatient medications have been marked as taking for the 05/15/24 encounter (Appointment) with Jasmine Maceachern N, PA-C.    Physical Exam:    VS:  There were no vitals taken for this visit.   Wt Readings from Last 3 Encounters:  04/03/24 180 lb (81.6 kg)  03/30/24 185 lb (83.9 kg)  03/06/24 163 lb (73.9 kg)    GEN: Well nourished, well developed in no acute distress NECK: No JVD; No carotid bruits  CARDIAC: ***RRR, no murmurs, rubs, gallops RESPIRATORY:  Clear to auscultation without rales, wheezing or rhonchi  ABDOMEN: Soft, non-tender, non-distended EXTREMITIES:  No edema; No acute deformity   Asessement and Plan:.     ***     Disposition: F/u with ***  Signed, Philis Doke N Emaly Boschert, PA-C

## 2024-05-15 ENCOUNTER — Ambulatory Visit: Admitting: Physician Assistant

## 2024-05-15 DIAGNOSIS — I10 Essential (primary) hypertension: Secondary | ICD-10-CM

## 2024-05-15 DIAGNOSIS — I340 Nonrheumatic mitral (valve) insufficiency: Secondary | ICD-10-CM

## 2024-05-15 DIAGNOSIS — I428 Other cardiomyopathies: Secondary | ICD-10-CM

## 2024-05-15 DIAGNOSIS — I350 Nonrheumatic aortic (valve) stenosis: Secondary | ICD-10-CM

## 2024-05-15 DIAGNOSIS — I5022 Chronic systolic (congestive) heart failure: Secondary | ICD-10-CM

## 2024-05-15 DIAGNOSIS — R0600 Dyspnea, unspecified: Secondary | ICD-10-CM

## 2024-05-17 ENCOUNTER — Inpatient Hospital Stay (HOSPITAL_COMMUNITY)
Admission: EM | Admit: 2024-05-17 | Discharge: 2024-05-23 | DRG: 280 | Disposition: A | Discharge status: Home / Self Care | Source: Ambulatory Visit | Attending: Internal Medicine | Admitting: Internal Medicine

## 2024-05-17 ENCOUNTER — Other Ambulatory Visit: Payer: Self-pay

## 2024-05-17 ENCOUNTER — Inpatient Hospital Stay (HOSPITAL_COMMUNITY)

## 2024-05-17 ENCOUNTER — Emergency Department (HOSPITAL_COMMUNITY)

## 2024-05-17 DIAGNOSIS — Z8701 Personal history of pneumonia (recurrent): Secondary | ICD-10-CM

## 2024-05-17 DIAGNOSIS — Z1152 Encounter for screening for COVID-19: Secondary | ICD-10-CM

## 2024-05-17 DIAGNOSIS — N179 Acute kidney failure, unspecified: Secondary | ICD-10-CM | POA: Diagnosis present

## 2024-05-17 DIAGNOSIS — T380X5A Adverse effect of glucocorticoids and synthetic analogues, initial encounter: Secondary | ICD-10-CM | POA: Diagnosis present

## 2024-05-17 DIAGNOSIS — J441 Chronic obstructive pulmonary disease with (acute) exacerbation: Secondary | ICD-10-CM | POA: Diagnosis present

## 2024-05-17 DIAGNOSIS — K219 Gastro-esophageal reflux disease without esophagitis: Secondary | ICD-10-CM | POA: Diagnosis present

## 2024-05-17 DIAGNOSIS — Z87440 Personal history of urinary (tract) infections: Secondary | ICD-10-CM

## 2024-05-17 DIAGNOSIS — E782 Mixed hyperlipidemia: Secondary | ICD-10-CM | POA: Diagnosis present

## 2024-05-17 DIAGNOSIS — Z7982 Long term (current) use of aspirin: Secondary | ICD-10-CM

## 2024-05-17 DIAGNOSIS — J45909 Unspecified asthma, uncomplicated: Secondary | ICD-10-CM | POA: Diagnosis not present

## 2024-05-17 DIAGNOSIS — Z79899 Other long term (current) drug therapy: Secondary | ICD-10-CM

## 2024-05-17 DIAGNOSIS — Z90722 Acquired absence of ovaries, bilateral: Secondary | ICD-10-CM

## 2024-05-17 DIAGNOSIS — I5043 Acute on chronic combined systolic (congestive) and diastolic (congestive) heart failure: Secondary | ICD-10-CM | POA: Diagnosis present

## 2024-05-17 DIAGNOSIS — Z881 Allergy status to other antibiotic agents status: Secondary | ICD-10-CM

## 2024-05-17 DIAGNOSIS — J45901 Unspecified asthma with (acute) exacerbation: Secondary | ICD-10-CM | POA: Diagnosis present

## 2024-05-17 DIAGNOSIS — I2510 Atherosclerotic heart disease of native coronary artery without angina pectoris: Secondary | ICD-10-CM | POA: Diagnosis present

## 2024-05-17 DIAGNOSIS — N393 Stress incontinence (female) (male): Secondary | ICD-10-CM | POA: Diagnosis present

## 2024-05-17 DIAGNOSIS — D509 Iron deficiency anemia, unspecified: Secondary | ICD-10-CM | POA: Diagnosis present

## 2024-05-17 DIAGNOSIS — I5022 Chronic systolic (congestive) heart failure: Secondary | ICD-10-CM | POA: Diagnosis present

## 2024-05-17 DIAGNOSIS — Z8249 Family history of ischemic heart disease and other diseases of the circulatory system: Secondary | ICD-10-CM

## 2024-05-17 DIAGNOSIS — Z85820 Personal history of malignant melanoma of skin: Secondary | ICD-10-CM

## 2024-05-17 DIAGNOSIS — D631 Anemia in chronic kidney disease: Secondary | ICD-10-CM | POA: Diagnosis present

## 2024-05-17 DIAGNOSIS — I130 Hypertensive heart and chronic kidney disease with heart failure and stage 1 through stage 4 chronic kidney disease, or unspecified chronic kidney disease: Secondary | ICD-10-CM | POA: Diagnosis present

## 2024-05-17 DIAGNOSIS — R911 Solitary pulmonary nodule: Secondary | ICD-10-CM | POA: Diagnosis present

## 2024-05-17 DIAGNOSIS — R1084 Generalized abdominal pain: Secondary | ICD-10-CM | POA: Diagnosis not present

## 2024-05-17 DIAGNOSIS — E66812 Obesity, class 2: Secondary | ICD-10-CM | POA: Diagnosis present

## 2024-05-17 DIAGNOSIS — J189 Pneumonia, unspecified organism: Secondary | ICD-10-CM | POA: Diagnosis present

## 2024-05-17 DIAGNOSIS — Z9049 Acquired absence of other specified parts of digestive tract: Secondary | ICD-10-CM

## 2024-05-17 DIAGNOSIS — Z880 Allergy status to penicillin: Secondary | ICD-10-CM

## 2024-05-17 DIAGNOSIS — Z882 Allergy status to sulfonamides status: Secondary | ICD-10-CM

## 2024-05-17 DIAGNOSIS — E1165 Type 2 diabetes mellitus with hyperglycemia: Secondary | ICD-10-CM | POA: Diagnosis present

## 2024-05-17 DIAGNOSIS — R109 Unspecified abdominal pain: Secondary | ICD-10-CM | POA: Diagnosis present

## 2024-05-17 DIAGNOSIS — I080 Rheumatic disorders of both mitral and aortic valves: Secondary | ICD-10-CM | POA: Diagnosis present

## 2024-05-17 DIAGNOSIS — Z96653 Presence of artificial knee joint, bilateral: Secondary | ICD-10-CM | POA: Diagnosis present

## 2024-05-17 DIAGNOSIS — J9601 Acute respiratory failure with hypoxia: Secondary | ICD-10-CM | POA: Diagnosis present

## 2024-05-17 DIAGNOSIS — I1A0 Resistant hypertension: Secondary | ICD-10-CM | POA: Diagnosis present

## 2024-05-17 DIAGNOSIS — N390 Urinary tract infection, site not specified: Secondary | ICD-10-CM | POA: Diagnosis present

## 2024-05-17 DIAGNOSIS — E119 Type 2 diabetes mellitus without complications: Secondary | ICD-10-CM

## 2024-05-17 DIAGNOSIS — Z885 Allergy status to narcotic agent status: Secondary | ICD-10-CM

## 2024-05-17 DIAGNOSIS — I428 Other cardiomyopathies: Secondary | ICD-10-CM | POA: Diagnosis present

## 2024-05-17 DIAGNOSIS — Z90710 Acquired absence of both cervix and uterus: Secondary | ICD-10-CM

## 2024-05-17 DIAGNOSIS — D649 Anemia, unspecified: Secondary | ICD-10-CM | POA: Diagnosis present

## 2024-05-17 DIAGNOSIS — M1990 Unspecified osteoarthritis, unspecified site: Secondary | ICD-10-CM | POA: Diagnosis present

## 2024-05-17 DIAGNOSIS — I214 Non-ST elevation (NSTEMI) myocardial infarction: Principal | ICD-10-CM | POA: Diagnosis present

## 2024-05-17 DIAGNOSIS — A419 Sepsis, unspecified organism: Secondary | ICD-10-CM | POA: Diagnosis present

## 2024-05-17 DIAGNOSIS — Z9089 Acquired absence of other organs: Secondary | ICD-10-CM

## 2024-05-17 DIAGNOSIS — R1310 Dysphagia, unspecified: Secondary | ICD-10-CM | POA: Diagnosis present

## 2024-05-17 DIAGNOSIS — J9811 Atelectasis: Secondary | ICD-10-CM | POA: Diagnosis present

## 2024-05-17 DIAGNOSIS — I350 Nonrheumatic aortic (valve) stenosis: Secondary | ICD-10-CM | POA: Diagnosis present

## 2024-05-17 DIAGNOSIS — N184 Chronic kidney disease, stage 4 (severe): Secondary | ICD-10-CM | POA: Diagnosis present

## 2024-05-17 DIAGNOSIS — E8721 Acute metabolic acidosis: Secondary | ICD-10-CM | POA: Diagnosis present

## 2024-05-17 DIAGNOSIS — Z9889 Other specified postprocedural states: Secondary | ICD-10-CM

## 2024-05-17 DIAGNOSIS — F419 Anxiety disorder, unspecified: Secondary | ICD-10-CM | POA: Diagnosis not present

## 2024-05-17 DIAGNOSIS — N3000 Acute cystitis without hematuria: Secondary | ICD-10-CM

## 2024-05-17 DIAGNOSIS — E1122 Type 2 diabetes mellitus with diabetic chronic kidney disease: Secondary | ICD-10-CM | POA: Diagnosis present

## 2024-05-17 DIAGNOSIS — Z833 Family history of diabetes mellitus: Secondary | ICD-10-CM

## 2024-05-17 DIAGNOSIS — I5033 Acute on chronic diastolic (congestive) heart failure: Secondary | ICD-10-CM | POA: Diagnosis not present

## 2024-05-17 DIAGNOSIS — Z6834 Body mass index (BMI) 34.0-34.9, adult: Secondary | ICD-10-CM

## 2024-05-17 DIAGNOSIS — Z8611 Personal history of tuberculosis: Secondary | ICD-10-CM

## 2024-05-17 DIAGNOSIS — Z888 Allergy status to other drugs, medicaments and biological substances status: Secondary | ICD-10-CM

## 2024-05-17 DIAGNOSIS — Z8619 Personal history of other infectious and parasitic diseases: Secondary | ICD-10-CM

## 2024-05-17 DIAGNOSIS — Z823 Family history of stroke: Secondary | ICD-10-CM

## 2024-05-17 DIAGNOSIS — G43909 Migraine, unspecified, not intractable, without status migrainosus: Secondary | ICD-10-CM | POA: Diagnosis present

## 2024-05-17 DIAGNOSIS — Z800 Family history of malignant neoplasm of digestive organs: Secondary | ICD-10-CM

## 2024-05-17 DIAGNOSIS — K589 Irritable bowel syndrome without diarrhea: Secondary | ICD-10-CM | POA: Diagnosis present

## 2024-05-17 LAB — CBC
HCT: 28 % — ABNORMAL LOW (ref 36.0–46.0)
Hemoglobin: 9 g/dL — ABNORMAL LOW (ref 12.0–15.0)
MCH: 30.8 pg (ref 26.0–34.0)
MCHC: 32.1 g/dL (ref 30.0–36.0)
MCV: 95.9 fL (ref 80.0–100.0)
Platelets: 240 K/uL (ref 150–400)
RBC: 2.92 MIL/uL — ABNORMAL LOW (ref 3.87–5.11)
RDW: 12.7 % (ref 11.5–15.5)
WBC: 15 K/uL — ABNORMAL HIGH (ref 4.0–10.5)
nRBC: 0 % (ref 0.0–0.2)

## 2024-05-17 LAB — MAGNESIUM: Magnesium: 1.2 mg/dL — ABNORMAL LOW (ref 1.7–2.4)

## 2024-05-17 LAB — RETICULOCYTES
Immature Retic Fract: 8.8 % (ref 2.3–15.9)
RBC.: 2.55 MIL/uL — ABNORMAL LOW (ref 3.87–5.11)
Retic Count, Absolute: 53.6 K/uL (ref 19.0–186.0)
Retic Ct Pct: 2.1 % (ref 0.4–3.1)

## 2024-05-17 LAB — IRON AND TIBC
Iron: 12 ug/dL — ABNORMAL LOW (ref 28–170)
Saturation Ratios: 6 % — ABNORMAL LOW (ref 10.4–31.8)
TIBC: 206 ug/dL — ABNORMAL LOW (ref 250–450)
UIBC: 194 ug/dL

## 2024-05-17 LAB — URINALYSIS, ROUTINE W REFLEX MICROSCOPIC
Bilirubin Urine: NEGATIVE
Glucose, UA: NEGATIVE mg/dL
Ketones, ur: NEGATIVE mg/dL
Nitrite: POSITIVE — AB
Protein, ur: 100 mg/dL — AB
Specific Gravity, Urine: 1.011 (ref 1.005–1.030)
pH: 5 (ref 5.0–8.0)

## 2024-05-17 LAB — RESP PANEL BY RT-PCR (RSV, FLU A&B, COVID)  RVPGX2
Influenza A by PCR: NEGATIVE
Influenza B by PCR: NEGATIVE
Resp Syncytial Virus by PCR: NEGATIVE
SARS Coronavirus 2 by RT PCR: NEGATIVE

## 2024-05-17 LAB — BASIC METABOLIC PANEL WITH GFR
Anion gap: 10 (ref 5–15)
Anion gap: 8 (ref 5–15)
BUN: 47 mg/dL — ABNORMAL HIGH (ref 8–23)
BUN: 36 mg/dL — ABNORMAL HIGH (ref 8–23)
CO2: 18 mmol/L — ABNORMAL LOW (ref 22–32)
CO2: 17 mmol/L — ABNORMAL LOW (ref 22–32)
Calcium: 8.1 mg/dL — ABNORMAL LOW (ref 8.9–10.3)
Calcium: 6.5 mg/dL — ABNORMAL LOW (ref 8.9–10.3)
Chloride: 103 mmol/L (ref 98–111)
Chloride: 113 mmol/L — ABNORMAL HIGH (ref 98–111)
Creatinine, Ser: 2.62 mg/dL — ABNORMAL HIGH (ref 0.44–1.00)
Creatinine, Ser: 2.01 mg/dL — ABNORMAL HIGH (ref 0.44–1.00)
GFR, Estimated: 19 mL/min — ABNORMAL LOW (ref >60)
GFR, Estimated: 27 mL/min — ABNORMAL LOW (ref >60)
Glucose, Bld: 204 mg/dL — ABNORMAL HIGH (ref 70–99)
Glucose, Bld: 116 mg/dL — ABNORMAL HIGH (ref 70–99)
Potassium: 5.1 mmol/L (ref 3.5–5.1)
Potassium: 3.6 mmol/L (ref 3.5–5.1)
Sodium: 131 mmol/L — ABNORMAL LOW (ref 135–145)
Sodium: 138 mmol/L (ref 135–145)

## 2024-05-17 LAB — RESPIRATORY PANEL BY PCR

## 2024-05-17 LAB — TSH: TSH: 0.791 u[IU]/mL (ref 0.350–4.500)

## 2024-05-17 LAB — HEPATIC FUNCTION PANEL
ALT: 11 U/L (ref 0–44)
AST: 14 U/L — ABNORMAL LOW (ref 15–41)
Albumin: 2.9 g/dL — ABNORMAL LOW (ref 3.5–5.0)
Alkaline Phosphatase: 51 U/L (ref 38–126)
Bilirubin, Direct: 0.2 mg/dL (ref 0.0–0.2)
Indirect Bilirubin: 0.7 mg/dL (ref 0.3–0.9)
Total Bilirubin: 0.9 mg/dL (ref 0.0–1.2)
Total Protein: 6.7 g/dL (ref 6.5–8.1)

## 2024-05-17 LAB — FERRITIN: Ferritin: 52 ng/mL (ref 11–307)

## 2024-05-17 LAB — I-STAT CG4 LACTIC ACID, ED: Lactic Acid, Venous: 0.6 mmol/L (ref 0.5–1.9)

## 2024-05-17 LAB — HEMOGLOBIN A1C
Hgb A1c MFr Bld: 6.9 % — ABNORMAL HIGH (ref 4.8–5.6)
Mean Plasma Glucose: 151.33 mg/dL

## 2024-05-17 LAB — APTT: aPTT: 169 s (ref 24–36)

## 2024-05-17 LAB — BRAIN NATRIURETIC PEPTIDE: B Natriuretic Peptide: 740.5 pg/mL — ABNORMAL HIGH (ref 0.0–100.0)

## 2024-05-17 LAB — PROTIME-INR
INR: 1.6 — ABNORMAL HIGH (ref 0.8–1.2)
Prothrombin Time: 19.7 s — ABNORMAL HIGH (ref 11.4–15.2)

## 2024-05-17 LAB — CK: Total CK: 39 U/L (ref 38–234)

## 2024-05-17 LAB — TROPONIN I (HIGH SENSITIVITY)
Troponin I (High Sensitivity): 251 ng/L (ref <18)
Troponin I (High Sensitivity): 489 ng/L (ref <18)
Troponin I (High Sensitivity): 910 ng/L (ref <18)

## 2024-05-17 LAB — LACTIC ACID, PLASMA
Lactic Acid, Venous: 0.9 mmol/L (ref 0.5–1.9)
Lactic Acid, Venous: 1.1 mmol/L (ref 0.5–1.9)

## 2024-05-17 LAB — T4, FREE: Free T4: 1.08 ng/dL (ref 0.61–1.12)

## 2024-05-17 LAB — LIPASE, BLOOD: Lipase: 18 U/L (ref 11–51)

## 2024-05-17 LAB — OSMOLALITY, URINE: Osmolality, Ur: 379 mosm/kg (ref 300–900)

## 2024-05-17 LAB — SODIUM, URINE, RANDOM: Sodium, Ur: 34 mmol/L

## 2024-05-17 LAB — CREATININE, URINE, RANDOM: Creatinine, Urine: 82 mg/dL

## 2024-05-17 LAB — OSMOLALITY: Osmolality: 298 mosm/kg — ABNORMAL HIGH (ref 275–295)

## 2024-05-17 LAB — PHOSPHORUS: Phosphorus: 2.5 mg/dL (ref 2.5–4.6)

## 2024-05-17 MED ORDER — LACTATED RINGERS IV SOLN: INTRAVENOUS | Status: AC

## 2024-05-17 MED ORDER — INSULIN ASPART 100 UNIT/ML IJ SOLN
0.0000 [IU] | INTRAMUSCULAR | Status: DC
  Administered 2024-05-18 (×2): 2 [IU] via SUBCUTANEOUS
  Administered 2024-05-18 (×2): 5 [IU] via SUBCUTANEOUS
  Administered 2024-05-18: 1 [IU] via SUBCUTANEOUS
  Administered 2024-05-18: 2 [IU] via SUBCUTANEOUS
  Administered 2024-05-19: 1 [IU] via SUBCUTANEOUS
  Administered 2024-05-19: 2 [IU] via SUBCUTANEOUS
  Administered 2024-05-19 (×2): 1 [IU] via SUBCUTANEOUS
  Administered 2024-05-19: 2 [IU] via SUBCUTANEOUS
  Administered 2024-05-20 – 2024-05-21 (×5): 1 [IU] via SUBCUTANEOUS
  Administered 2024-05-21 – 2024-05-22 (×5): 2 [IU] via SUBCUTANEOUS
  Filled 2024-05-17: qty 2
  Filled 2024-05-17: qty 3
  Filled 2024-05-17: qty 1
  Filled 2024-05-17 (×2): qty 2
  Filled 2024-05-17: qty 1
  Filled 2024-05-17 (×2): qty 5
  Filled 2024-05-17: qty 2
  Filled 2024-05-17 (×4): qty 1
  Filled 2024-05-17: qty 2
  Filled 2024-05-17: qty 1
  Filled 2024-05-17 (×2): qty 2
  Filled 2024-05-17 (×2): qty 1
  Filled 2024-05-17 (×2): qty 2

## 2024-05-17 MED ORDER — HEPARIN (PORCINE) 25000 UT/250ML-% IV SOLN
1350.0000 [IU]/h | INTRAVENOUS | Status: DC
  Administered 2024-05-17: 1100 [IU]/h via INTRAVENOUS
  Administered 2024-05-18: 1250 [IU]/h via INTRAVENOUS
  Administered 2024-05-19: 1350 [IU]/h via INTRAVENOUS
  Filled 2024-05-17 (×3): qty 250

## 2024-05-17 MED ORDER — SODIUM CHLORIDE 0.9 % IV SOLN
2.0000 g | Freq: Once | INTRAVENOUS | Status: AC
  Administered 2024-05-17: 2 g via INTRAVENOUS
  Filled 2024-05-17: qty 12.5

## 2024-05-17 MED ORDER — VANCOMYCIN HCL IN DEXTROSE 1-5 GM/200ML-% IV SOLN
1000.0000 mg | Freq: Once | INTRAVENOUS | Status: AC
  Administered 2024-05-17: 1000 mg via INTRAVENOUS
  Filled 2024-05-17: qty 200

## 2024-05-17 MED ORDER — SODIUM CHLORIDE 0.9 % IV SOLN: 2.0000 g | INTRAVENOUS | Status: DC

## 2024-05-17 MED ORDER — VANCOMYCIN HCL 500 MG/100ML IV SOLN: 500.0000 mg | INTRAVENOUS | Status: DC

## 2024-05-17 MED ORDER — ASPIRIN 81 MG PO CHEW
324.0000 mg | CHEWABLE_TABLET | Freq: Once | ORAL | Status: AC
  Administered 2024-05-17: 324 mg via ORAL
  Filled 2024-05-17: qty 4

## 2024-05-17 MED ORDER — VANCOMYCIN HCL 750 MG/150ML IV SOLN: 750.0000 mg | INTRAVENOUS | Status: DC

## 2024-05-17 MED ORDER — LACTATED RINGERS IV BOLUS (SEPSIS)
1000.0000 mL | Freq: Once | INTRAVENOUS | Status: AC
  Administered 2024-05-17: 1000 mL via INTRAVENOUS

## 2024-05-17 MED ORDER — METRONIDAZOLE 500 MG/100ML IV SOLN
500.0000 mg | Freq: Two times a day (BID) | INTRAVENOUS | Status: DC
  Administered 2024-05-18: 500 mg via INTRAVENOUS
  Filled 2024-05-17: qty 100

## 2024-05-17 MED ORDER — METRONIDAZOLE 500 MG/100ML IV SOLN: 500.0000 mg | Freq: Two times a day (BID) | INTRAVENOUS | Status: DC

## 2024-05-17 MED ORDER — HEPARIN BOLUS VIA INFUSION
4000.0000 [IU] | Freq: Once | INTRAVENOUS | Status: AC
  Administered 2024-05-17: 4000 [IU] via INTRAVENOUS
  Filled 2024-05-17: qty 4000

## 2024-05-17 MED ORDER — MAGNESIUM SULFATE 2 GM/50ML IV SOLN
2.0000 g | Freq: Once | INTRAVENOUS | Status: AC
  Administered 2024-05-18: 2 g via INTRAVENOUS
  Filled 2024-05-17: qty 50

## 2024-05-17 MED ORDER — NITROGLYCERIN 0.4 MG SL SUBL: 0.4000 mg | SUBLINGUAL_TABLET | SUBLINGUAL | Status: DC | PRN

## 2024-05-17 MED ORDER — METRONIDAZOLE 500 MG/100ML IV SOLN
500.0000 mg | Freq: Once | INTRAVENOUS | Status: AC
  Administered 2024-05-17: 500 mg via INTRAVENOUS
  Filled 2024-05-17: qty 100

## 2024-05-17 NOTE — ED Notes (Signed)
 CCMD called and verified patient on cardiac telemetry

## 2024-05-17 NOTE — Assessment & Plan Note (Signed)
 Albuterol as needed

## 2024-05-17 NOTE — Assessment & Plan Note (Signed)
 Continue zetia 10 mg po q day

## 2024-05-17 NOTE — Assessment & Plan Note (Signed)
 Continue PPI.

## 2024-05-17 NOTE — Progress Notes (Signed)
 PHARMACY - ANTICOAGULATION CONSULT NOTE  Pharmacy Consult for Heparin  Indication: chest pain/ACS  Allergies  Allergen Reactions   Singulair  [Montelukast  Sodium] Other (See Comments)    Vivid dreams   Amlodipine  Besylate     Other reaction(s): fatgiue and nausea   Atorvastatin     Other reaction(s): strange feelings   Lisinopril Cough   Metformin Diarrhea   Penicillins Other (See Comments)    Other reaction(s): rash Unknown reaction.    Sulfa Antibiotics Nausea And Vomiting   Welchol  [Colesevelam Hcl]     Other reaction(s): constipation   Dilaudid  [Hydromorphone  Hcl] Anxiety and Other (See Comments)    paranoia   Sulfamethoxazole-Trimethoprim Rash    Patient Measurements: Height: 5' 2 (157.5 cm) Weight: 85.3 kg (188 lb) IBW/kg (Calculated) : 50.1 HEPARIN  DW (KG): 69.4  Vital Signs: Temp: 100.4 F (38 C) (11/05 1356) Temp Source: Oral (11/05 1356) BP: 165/75 (11/05 1545) Pulse Rate: 79 (11/05 1545)  Labs: Recent Labs    05/17/24 1408 05/17/24 1556  HGB 9.0*  --   HCT 28.0*  --   PLT 240  --   CREATININE 2.62*  --   TROPONINIHS 251* 489*    Estimated Creatinine Clearance: 20.8 mL/min (A) (by C-G formula based on SCr of 2.62 mg/dL (H)).   Medical History: Past Medical History:  Diagnosis Date   Acid reflux    Anemia    Anxiety    Arthritis    Asthma    infrequent problems   Cardiomyopathy (HCC)    CHF (congestive heart failure) (HCC)    Chronic UTI (urinary tract infection)    Complication of anesthesia    I quit breathing during surgery 2008 - has had surgery since with no problems    Diabetes mellitus    Endometriosis    hx of    Headache    hx of migraines   Hepatitis B    hx of dx during ruptured appendix hospitalization   Hyperlipemia    Hypertension    IBS (irritable bowel syndrome)    Irritable bowel syndrome    Kidney disease    Obesity    Pneumonia 12/2013   Pneumonia    PONV (postoperative nausea and vomiting)    Rash     sides of neck   Stress incontinence    Tuberculosis 1985    Medications:  Infusions:   heparin      lactated ringers  150 mL/hr at 05/17/24 1737    Assessment: Pt is a 68 year old admitted for ACS. Not on anticoagulation PTA.   Hgb 9.0. Per chart, she has a history of chronic anemia. PLT stable. No bleeding noted.   Goal of Therapy:  Heparin  level 0.3-0.7 units/ml Monitor platelets by anticoagulation protocol: Yes   Plan:  Give 4,000 units bolus x 1 Give heparin  1100 units/hr.  HL in 8 hours.  Daily CBC   Valkyrie Guardiola M Ryane Canavan 05/17/2024,5:43 PM

## 2024-05-17 NOTE — Progress Notes (Addendum)
 Pharmacy Antibiotic Note  Shelly [name] is a 68 y.o. female admitted on 05/17/2024 with sepsis.  Pharmacy has been consulted for vancomycin and cefepime dosing.  Plan: Vancomycin 1 g IV LD, then 750 mg q48h.  Cefepime 2g IV q24h  Monitor renal function closely  Height: 5' 2 (157.5 cm) Weight: 85.3 kg (188 lb) IBW/kg (Calculated) : 50.1  Temp (24hrs), Avg:99.3 F (37.4 C), Min:98.2 F (36.8 C), Max:100.4 F (38 C)  Recent Labs  Lab 05/17/24 1408 05/17/24 1610  WBC 15.0*  --   CREATININE 2.62*  --   LATICACIDVEN  --  0.6    Estimated Creatinine Clearance: 20.8 mL/min (A) (by C-G formula based on SCr of 2.62 mg/dL (H)).    Allergies  Allergen Reactions   Singulair  [Montelukast  Sodium] Other (See Comments)    Vivid dreams   Amlodipine  Besylate     Other reaction(s): fatgiue and nausea   Atorvastatin     Other reaction(s): strange feelings   Lisinopril Cough   Metformin Diarrhea   Penicillins Other (See Comments)    Other reaction(s): rash Unknown reaction.    Sulfa Antibiotics Nausea And Vomiting   Welchol  [Colesevelam Hcl]     Other reaction(s): constipation   Dilaudid  [Hydromorphone  Hcl] Anxiety and Other (See Comments)    paranoia   Sulfamethoxazole-Trimethoprim Rash    Antimicrobials this admission: Flagyl  11/5 >>  Cefepime 11/5 >>  Vanc 11/5 >>    Microbiology results: 11/5 BCx:  11/5 MRSA PCR:   Thank you for allowing pharmacy to be a part of this patient's care.  Maitland Lesiak M Sarahlynn Cisnero 05/17/2024 9:15 PM

## 2024-05-17 NOTE — ED Triage Notes (Addendum)
 Sent by GI doctor for SOB. Pt observed walking in very slowly appearing winded. Pt placed in wheelchair.  Has been SOB for a while but  worse over the last 5 days.  She states she thinks it is her esophagus. Pt has low grade temp and is a little htn.

## 2024-05-17 NOTE — Sepsis Progress Note (Signed)
 Code Sepsis protocol being monitored by eLink.

## 2024-05-17 NOTE — ED Notes (Signed)
 Trop 251 called from the lab charge nurse notified

## 2024-05-17 NOTE — H&P (Incomplete)
 Shelly [name] FMW:[id number] DOB: Nov 21, 1955 DOA: 05/17/2024     PCP: Loreli Kins, MD   Outpatient Specialists:   CARDS:   Dr. Annabella Scarce, MD   Patient arrived to ER on 05/17/24 at 1352 Referred by Attending Rogelia Jerilynn RAMAN, MD   Patient coming from:    home Lives alone,     Chief Complaint:   Chief Complaint  Patient presents with  . Shortness of Breath  . Chest Pain  . Gastroesophageal Reflux  . Anxiety    HPI: Shelly [name] is a 68 y.o. female with medical history significant of CHF, HTN, NICM, HLD, aortic stenosis, CKD IV, gastric adenoma, DM2, anemia, melanoma excision, sp cholecystectomy, hx of Tb treated    Presented with  fever, increased abdominal girth, chest pain/epigastric pain  Since Saturday has been having dyspnea, CP radiating to her neck She thought it was GERD and went to her GI  Was found to be hypoxic mid 83% on RA Improved on 2 L  Febrile to 100.4 Initial trop 250 repeat 400  Continues to have chest pain Blood cultures ordered   Started on broad spectrum abx cefepime/ vanc and flagyl   Cardiology consulted Dr. Wonda  Agreed with Heparin  hold off on cardiac cath given AKI  Careful rehydration          Recently was treated for UTi with keflex and finished all antibiotics  Denies significant ETOH intake   Does not smoke      Regarding pertinent Chronic problems:    Hyperlipidemia -  on zetia Lipid Panel  No results found for: CHOL, TRIG, HDL, CHOLHDL, VLDL, LDLCALC, LDLDIRECT, LABVLDL   HTN on coreg , clonidine , cardura , hydralazine    chronic CHF diastolic/ - last echo  Recent Results (from the past 56199 hours)  ECHOCARDIOGRAM COMPLETE   Collection Time: 05/03/24  9:33 AM  Result Value   Area-P 1/2 2.73   S' Lateral 4.40   AV Area mean vel 1.58   AR max vel 1.66   AV Area VTI 1.61   Ao pk vel 2.72   AV Mean grad 17.0   AV Peak grad 29.5   Narrative      ECHOCARDIOGRAM REPORT       IMPRESSIONS    1. Left ventricular ejection fraction by 3D volume is 55 %. The left ventricle has normal function. The left ventricle has no regional wall motion abnormalities. The left ventricular internal cavity size was mildly dilated. There is mild concentric left  ventricular hypertrophy. Left ventricular diastolic parameters are consistent with Grade I diastolic dysfunction (impaired relaxation).  2. Right ventricular systolic function is normal. The right ventricular size is normal.  3. Left atrial size was severely dilated.  4. The mitral valve is normal in structure. Mild to moderate mitral valve regurgitation. No evidence of mitral stenosis.  5. The aortic valve is normal in structure. Aortic valve regurgitation is not visualized. Mild aortic valve stenosis. Aortic valve area, by VTI measures 1.61 cm. Aortic valve mean gradient measures 17.0 mmHg. Aortic valve Vmax measures 2.72 m/s.  6. The inferior vena cava is dilated in size with <50% respiratory variability, suggesting right atrial pressure of 15 mmHg.            DM 2 -  Lab Results  Component Value Date   HGBA1C 8.1 (H) 04/20/2015    diet controlled    obesity-   BMI Readings from Last 1 Encounters:  05/17/24 34.39 kg/m  Asthma -well   controlled on home inhalers/ nebs                       CKD stage Iv   baseline Cr2.2 Estimated Creatinine Clearance: 20.8 mL/min (A) (by C-G formula based on SCr of 2.62 mg/dL (H)).  Lab Results  Component Value Date   CREATININE 2.62 (H) 05/17/2024   CREATININE 2.12 (H) 03/06/2024   CREATININE 2.19 (H) 03/06/2024   Lab Results  Component Value Date   NA 131 (L) 05/17/2024   CL 103 05/17/2024   K 5.1 05/17/2024   CO2 18 (L) 05/17/2024   BUN 47 (H) 05/17/2024   CREATININE 2.62 (H) 05/17/2024   GFRNONAA 19 (L) 05/17/2024   CALCIUM  8.1 (L) 05/17/2024   ALBUMIN 2.9 (L) 05/17/2024   GLUCOSE 204 (H) 05/17/2024    Chronic anemia - baseline hg Hemoglobin &  Hematocrit  Recent Labs    03/06/24 0652 05/17/24 1408  HGB 10.8* 9.0*   Iron /TIBC/Ferritin/ %Sat    Component Value Date/Time   IRON  37 (L) 09/13/2011 0651   TIBC 242 (L) 09/13/2011 0651   FERRITIN 260 09/13/2011 0651   IRONPCTSAT 15 (L) 09/13/2011 0651     Cancer: melanoma    While in ER: Clinical Course as of 05/17/24 1921  Wed May 17, 2024  1738 cooper [LS]    Clinical Course User Index [LS] Rogelia Jerilynn RAMAN, MD       Lab Orders         Blood culture (routine x 2)         Resp panel by RT-PCR (RSV, Flu A&B, Covid) Anterior Nasal Swab         Basic metabolic panel         CBC         Hepatic function panel         Lipase, blood         Urinalysis, Routine w reflex microscopic -Urine, Clean Catch         Brain natriuretic peptide         Heparin  level (unfractionated)         I-Stat CG4 Lactic Acid        CXR - New small right pleural effusion and possible small amount left pleural fluid.  CTabd/pelvis - ordered    Following Medications were ordered in ER: Medications  lactated ringers  infusion ( Intravenous New Bag/Given 05/17/24 1737)  nitroGLYCERIN  (NITROSTAT ) SL tablet 0.4 mg (has no administration in time range)  heparin  ADULT infusion 100 units/mL (25000 units/250mL) (1,100 Units/hr Intravenous New Bag/Given 05/17/24 1811)  lactated ringers  bolus 1,000 mL (0 mLs Intravenous Stopped 05/17/24 1727)  ceFEPIme (MAXIPIME) 2 g in sodium chloride  0.9 % 100 mL IVPB (0 g Intravenous Stopped 05/17/24 1649)  metroNIDAZOLE  (FLAGYL ) IVPB 500 mg (0 mg Intravenous Stopped 05/17/24 1727)  vancomycin (VANCOCIN) IVPB 1000 mg/200 mL premix (0 mg Intravenous Stopped 05/17/24 1726)  aspirin  chewable tablet 324 mg (324 mg Oral Given 05/17/24 1620)  heparin  bolus via infusion 4,000 Units (4,000 Units Intravenous Bolus from Bag 05/17/24 1811)    _______________________________________________________ ER Provider Called:     Cardiology  Dr. Wonda They Recommend admit to  medicine    SEEN in ER     ED Triage Vitals  Encounter Vitals Group     BP 05/17/24 1356 (!) 178/80     Girls Systolic BP Percentile --      Girls Diastolic BP Percentile --  Boys Systolic BP Percentile --      Boys Diastolic BP Percentile --      Pulse Rate 05/17/24 1356 91     Resp 05/17/24 1356 18     Temp 05/17/24 1356 (!) 100.4 F (38 C)     Temp Source 05/17/24 1356 Oral     SpO2 05/17/24 1356 94 %     Weight 05/17/24 1404 188 lb (85.3 kg)     Height 05/17/24 1404 5' 2 (1.575 m)     Head Circumference --      Peak Flow --      Pain Score 05/17/24 1403 7     Pain Loc --      Pain Education --      Exclude from Growth Chart --   UFJK(75)@     _________________________________________ Significant initial  Findings: Abnormal Labs Reviewed  BASIC METABOLIC PANEL WITH GFR - Abnormal; Notable for the following components:      Result Value   Sodium 131 (*)    CO2 18 (*)    Glucose, Bld 204 (*)    BUN 47 (*)    Creatinine, Ser 2.62 (*)    Calcium  8.1 (*)    GFR, Estimated 19 (*)    All other components within normal limits  CBC - Abnormal; Notable for the following components:   WBC 15.0 (*)    RBC 2.92 (*)    Hemoglobin 9.0 (*)    HCT 28.0 (*)    All other components within normal limits  HEPATIC FUNCTION PANEL - Abnormal; Notable for the following components:   Albumin 2.9 (*)    AST 14 (*)    All other components within normal limits  URINALYSIS, ROUTINE W REFLEX MICROSCOPIC - Abnormal; Notable for the following components:   Hgb urine dipstick SMALL (*)    Protein, ur 100 (*)    Nitrite POSITIVE (*)    Leukocytes,Ua TRACE (*)    Bacteria, UA RARE (*)    All other components within normal limits  TROPONIN I (HIGH SENSITIVITY) - Abnormal; Notable for the following components:   Troponin I (High Sensitivity) 251 (*)    All other components within normal limits  TROPONIN I (HIGH SENSITIVITY) - Abnormal; Notable for the following components:   Troponin I  (High Sensitivity) 489 (*)    All other components within normal limits      _________________________ Troponin  ordered Cardiac Panel (last 3 results) Recent Labs    05/17/24 1408 05/17/24 1556 05/17/24 2044  CKTOTAL  --   --  39  TROPONINIHS 251* 489* 910*     ECG: Ordered Personally reviewed and interpreted by me showing: HR : *** Rhythm: *NSR, Sinus tachycardia * A.fib. W RVR, RBBB, LBBB, Paced Ischemic changes*nonspecific changes, no evidence of ischemic changes QTC*  BNP (last 3 results) Recent Labs    03/06/24 0755  BNP 167.9*      No results for input(s): DDIMER, FERRITIN, LDH, CRP in the last 72 hours.    ____________________ This patient meets SIRS Criteria and may be septic. SIRS = Systemic Inflammatory Response Syndrome  Order a lactic acid level if needed AND/OR Initiate the sepsis protocol with the attached order set OR Click Treating Associated Infection or Illness if the patient is being treated for an infection that is a known cause of these abnormalities     The recent clinical data is shown below. Vitals:   05/17/24 1745 05/17/24 1800 05/17/24 1815 05/17/24 1830  BP:   (!) 144/62   Pulse: 76 75 73 82  Resp: 18 17 16 18   Temp:      TempSrc:      SpO2: 97% 97% 97% 97%  Weight:      Height:            WBC     Component Value Date/Time   WBC 15.0 (H) 05/17/2024 1408   LYMPHSABS 2.3 01/06/2018 1220   MONOABS 0.8 01/06/2018 1220   EOSABS 0.3 01/06/2018 1220   BASOSABS 0.1 01/06/2018 1220        Lactic Acid, Venous    Component Value Date/Time   LATICACIDVEN 0.6 05/17/2024 1610     Lactic Acid, Venous    Component Value Date/Time   LATICACIDVEN 0.6 05/17/2024 1610    Procalcitonin *** Ordered      UA *** no evidence of UTI  ***Pending ***not ordered   Urine analysis:    Component Value Date/Time   COLORURINE YELLOW 05/17/2024 1759   APPEARANCEUR CLEAR 05/17/2024 1759   LABSPEC 1.011 05/17/2024 1759    PHURINE 5.0 05/17/2024 1759   GLUCOSEU NEGATIVE 05/17/2024 1759   HGBUR SMALL (A) 05/17/2024 1759   BILIRUBINUR NEGATIVE 05/17/2024 1759   KETONESUR NEGATIVE 05/17/2024 1759   PROTEINUR 100 (A) 05/17/2024 1759   UROBILINOGEN 0.2 04/20/2015 1418   NITRITE POSITIVE (A) 05/17/2024 1759   LEUKOCYTESUR TRACE (A) 05/17/2024 1759    Results for orders placed or performed in visit on 09/27/19  Novel Coronavirus, NAA ([facility])     Status: None   Collection Time: 09/27/19 12:19 PM   Specimen: Nasopharyngeal(NP) swabs in vial transport medium   NASOPHARYNGE  TESTING  Result Value Ref Range Status   SARS-CoV-2, NAA Not Detected Not Detected Final    Comment: This nucleic acid amplification test was developed and its performance characteristics determined by [employer]. Nucleic acid amplification tests include RT-PCR and TMA. This test has not been FDA cleared or approved. This test has been authorized by FDA under an Emergency Use Authorization (EUA). This test is only authorized for the duration of time the declaration that circumstances exist justifying the authorization of the emergency use of in vitro diagnostic tests for detection of SARS-CoV-2 virus and/or diagnosis of COVID-19 infection under section 564(b)(1) of the Act, 21 U.S.C. 639aaa-6(a) (1), unless the authorization is terminated or revoked sooner. When diagnostic testing is negative, the possibility of a false negative result should be considered in the context of a patient's recent exposures and the presence of clinical signs and symptoms consistent with COVID-19. An individual without symptoms of COVID-19 and who is not shedding SARS-CoV-2 virus wo uld expect to have a negative (not detected) result in this assay.     ABX started Antibiotics Given (last 72 hours)     Date/Time Action Medication Dose Rate   05/17/24 1614 New Bag/Given   vancomycin (VANCOCIN) IVPB 1000 mg/200 mL premix 1,000 mg 200 mL/hr    05/17/24 1615 New Bag/Given   ceFEPIme (MAXIPIME) 2 g in sodium chloride  0.9 % 100 mL IVPB 2 g 200 mL/hr   05/17/24 1617 New Bag/Given   metroNIDAZOLE  (FLAGYL ) IVPB 500 mg 500 mg 100 mL/hr        No results found for the last 90 days.    ________________________________________________________________  Arterial ***Venous  Blood Gas result:  pH *** pCO2 ***; pO2 ***;     %O2 Sat ***.  ABG    Component Value Date/Time   PHART 7.419  06/29/2014 0814   PCO2ART 41.7 06/29/2014 0814   PO2ART 66.0 (L) 06/29/2014 0814   HCO3 29.5 (H) 06/29/2014 0821   TCO2 21 04/20/2015 1345   O2SAT 62.0 06/29/2014 0821       __________________________________________________________ Recent Labs  Lab 05/17/24 1408  NA 131*  K 5.1  CO2 18*  GLUCOSE 204*  BUN 47*  CREATININE 2.62*  CALCIUM  8.1*    Cr  * stable,  Up from baseline see below Lab Results  Component Value Date   CREATININE 2.62 (H) 05/17/2024   CREATININE 2.12 (H) 03/06/2024   CREATININE 2.19 (H) 03/06/2024    Recent Labs  Lab 05/17/24 1556  AST 14*  ALT 11  ALKPHOS 51  BILITOT 0.9  PROT 6.7  ALBUMIN 2.9*   Lab Results  Component Value Date   CALCIUM  8.1 (L) 05/17/2024          Plt: Lab Results  Component Value Date   PLT 240 05/17/2024         Recent Labs  Lab 05/17/24 1408  WBC 15.0*  HGB 9.0*  HCT 28.0*  MCV 95.9  PLT 240    HG/HCT * stable,  Down *Up from baseline see below    Component Value Date/Time   HGB 9.0 (L) 05/17/2024 1408   HCT 28.0 (L) 05/17/2024 1408   MCV 95.9 05/17/2024 1408      Recent Labs  Lab 05/17/24 1556  LIPASE 18   No results for input(s): AMMONIA in the last 168 hours.    _______________________________________________ Hospitalist was called for admission for *** NSTEMI (non-ST elevated myocardial infarction) (HCC) ***    The following Work up has been ordered so far:  Orders Placed This Encounter  Procedures  . Blood culture (routine x 2)  .  Resp panel by RT-PCR (RSV, Flu A&B, Covid) Anterior Nasal Swab  . DG Chest 2 View  . Basic metabolic panel  . CBC  . Hepatic function panel  . Lipase, blood  . Urinalysis, Routine w reflex microscopic -Urine, Clean Catch  . Brain natriuretic peptide  . Heparin  level (unfractionated)  . Document Height and Actual Weight  . If O2 Sat <94% administer O2 at 2 liters/minute via nasal cannula  . DO NOT delay antibiotics if unable to obtain blood culture.  . Code Sepsis activation.  This occurs automatically when order is signed and prioritizes pharmacy, lab, and radiology services for STAT collections and interventions.  If [facility] downtime, call [facility] [phone]) to activate Code Sepsis.  SABRA monitoring by pharmacy  . heparin  per pharmacy consult  . Inpatient consult to Cardiology  . Consult to hospitalist  . I-Stat CG4 Lactic Acid  . EKG 12-Lead  . ED EKG  . ED EKG  . Insert 2nd peripheral IV if not already present.     OTHER Significant initial  Findings:  labs showing:     DM  labs:  HbA1C: No results for input(s): HGBA1C in the last 8760 hours.     CBG (last 3)  No results for input(s): GLUCAP in the last 72 hours.        Cultures:    Component Value Date/Time   SDES URINE, CLEAN CATCH 09/20/2016 1814   SPECREQUEST NONE 09/20/2016 1814   CULT (A) 09/20/2016 1814    <10,000 COLONIES/mL INSIGNIFICANT GROWTH Performed at [facility], [address], [place], [place] [address]    REPTSTATUS 09/22/2016 FINAL 09/20/2016 1814     Radiological Exams on Admission:  DG Chest 2 View Result Date: 05/17/2024 CLINICAL DATA:  Shortness of breath and chest pain. EXAM: CHEST - 2 VIEW COMPARISON:  03/06/2024 FINDINGS: Lungs are adequately inflated without focal lobar consolidation. Small amount right pleural fluid which is new and possible small amount left pleural fluid. Mild prominence of the central pulmonary vessels which may be due to mild degree of vascular  congestion. Cardiomediastinal silhouette and remainder of the exam is unchanged. IMPRESSION: 1. New small right pleural effusion and possible small amount left pleural fluid. 2. Suggestion of mild vascular congestion. Electronically Signed   By: Toribio Agreste M.D.   On: 05/17/2024 15:14   _______________________________________________________________________________________________________ Latest  Blood pressure (!) 144/62, pulse 82, temperature (!) 100.4 F (38 C), temperature source Oral, resp. rate 18, height 5' 2 (1.575 m), weight 85.3 kg, SpO2 97%.   Vitals  labs and radiology finding personally reviewed  Review of Systems:    Pertinent positives include: ***  Constitutional:  No weight loss, night sweats, Fevers, chills, fatigue, weight loss  HEENT:  No headaches, Difficulty swallowing,Tooth/dental problems,Sore throat,  No sneezing, itching, ear ache, nasal congestion, post nasal drip,  Cardio-vascular:  No chest pain, Orthopnea, PND, anasarca, dizziness, palpitations.no Bilateral lower extremity swelling  GI:  No heartburn, indigestion, abdominal pain, nausea, vomiting, diarrhea, change in bowel habits, loss of appetite, melena, blood in stool, hematemesis Resp:  no shortness of breath at rest. No dyspnea on exertion, No excess mucus, no productive cough, No non-productive cough, No coughing up of blood.No change in color of mucus.No wheezing. Skin:  no rash or lesions. No jaundice GU:  no dysuria, change in color of urine, no urgency or frequency. No straining to urinate.  No flank pain.  Musculoskeletal:  No joint pain or no joint swelling. No decreased range of motion. No back pain.  Psych:  No change in mood or affect. No depression or anxiety. No memory loss.  Neuro: no localizing neurological complaints, no tingling, no weakness, no double vision, no gait abnormality, no slurred speech, no confusion  All systems reviewed and apart from HOPI all are  negative _______________________________________________________________________________________________ Past Medical History:   Past Medical History:  Diagnosis Date  . Acid reflux   . Anemia   . Anxiety   . Arthritis   . Asthma    infrequent problems  . Cardiomyopathy (HCC)   . CHF (congestive heart failure) (HCC)   . Chronic UTI (urinary tract infection)   . Complication of anesthesia    I quit breathing during surgery 2008 - has had surgery since with no problems   . Diabetes mellitus   . Endometriosis    hx of   . Headache    hx of migraines  . Hepatitis B    hx of dx during ruptured appendix hospitalization  . Hyperlipemia   . Hypertension   . IBS (irritable bowel syndrome)   . Irritable bowel syndrome   . Kidney disease   . Obesity   . Pneumonia 12/2013  . Pneumonia   . PONV (postoperative nausea and vomiting)   . Rash    sides of neck  . Stress incontinence   . Tuberculosis 1985      Past Surgical History:  Procedure Laterality Date  . ABDOMINAL ADHESION SURGERY  1989   x 7  . ABDOMINAL HYSTERECTOMY  01/1996  . APPENDECTOMY  1987  . BREAST BIOPSY Right 05/18/2022   MM RT BREAST BX W LOC DEV 1ST LESION IMAGE BX SPEC STEREO GUIDE  05/18/2022 [facility]  . CHOLECYSTECTOMY  11/24/2000  . COLON SURGERY  2010   colon resection due to adhesions  . LAPAROSCOPIC LYSIS OF ADHESIONS  07/1991   pelvic adhesions  . LEFT AND RIGHT HEART CATHETERIZATION WITH CORONARY ANGIOGRAM N/A 06/29/2014   Procedure: LEFT AND RIGHT HEART CATHETERIZATION WITH CORONARY ANGIOGRAM;  Surgeon: Candyce GORMAN Reek, MD;  Location: [facility] CATH LAB;  Service: Cardiovascular;  Laterality: N/A;  . OOPHORECTOMY Right 04/1995  . OOPHORECTOMY Left 12/1992   removed left ovary  . PARTIAL KNEE ARTHROPLASTY Left 10/27/2013   Procedure: LEFT KNEE UNICOMPARTMENTAL REPLACEMENT ;  Surgeon: Dempsey LULLA Moan, MD;  Location: [facility] ORS;  Service: Orthopedics;  Laterality: Left;  . PARTIAL KNEE  ARTHROPLASTY Right 05/21/2014   Procedure: MEDIAL UNICOMPARTMENTAL RIGHT KNEE ARTHROPLASTY;  Surgeon: Dempsey Moan LULLA, MD;  Location: [facility] OR[facility];  Service: Orthopedics;  Laterality: Right;  . TONSILLECTOMY      Social History:  Ambulatory *** independently cane, walker  wheelchair bound, bed bound     reports that she has never smoked. She has never used smokeless tobacco. She reports current alcohol use. She reports that she does not use drugs.     Family History: *** Family History  Problem Relation Age of Onset  . Hypertension Mother   . Heart failure Mother   . Stroke Mother   . Heart disease Father   . Diabetes Father   . Liver cancer Brother   . Heart attack Maternal Grandmother   . Hypertension Maternal Grandmother   . Heart attack Paternal Grandmother   . Breast cancer Neg Hx    ______________________________________________________________________________________________ Allergies: Allergies  Allergen Reactions  . Singulair  [Montelukast  Sodium] Other (See Comments)    Vivid dreams  . Amlodipine  Besylate     Other reaction(s): fatgiue and nausea  . Atorvastatin     Other reaction(s): strange feelings  . Lisinopril Cough  . Metformin Diarrhea  . Penicillins Other (See Comments)    Other reaction(s): rash Unknown reaction.   . Sulfa Antibiotics Nausea And Vomiting  . Welchol  [Colesevelam Hcl]     Other reaction(s): constipation  . Dilaudid  [Hydromorphone  Hcl] Anxiety and Other (See Comments)    paranoia  . Sulfamethoxazole-Trimethoprim Rash     Prior to Admission medications   Medication Sig Start Date End Date Taking? Authorizing Provider  albuterol  (PROVENTIL ) (2.5 MG/3ML) 0.083% nebulizer solution Take 3 mLs (2.5 mg total) by nebulization every 6 (six) hours as needed for wheezing or shortness of breath. 07/26/19   Desai, Nikita S, MD  ALPRAZolam  (XANAX ) 0.5 MG tablet Take 0.5 mg by mouth 3 (three) times daily as needed. Anxiety    [provider]   carvedilol  (COREG ) 25 MG tablet Take 25 mg by mouth 2 (two) times daily with a meal.    [provider]  cloNIDine  (CATAPRES  - DOSED IN MG/24 HR) 0.2 mg/24hr patch Place 0.2 mg onto the skin once a week.    [provider]  cloNIDine  (CATAPRES ) 0.1 MG tablet Take 1 tablet (0.1 mg total) by mouth 2 (two) times daily. 04/03/24   Raford Riggs, MD  COMBIVENT RESPIMAT 20-100 MCG/ACT AERS respimat Take 1 puff by mouth daily. 07/23/16   [provider]  doxazosin  (CARDURA ) 2 MG tablet Take 1 tablet (2 mg total) by mouth daily. 04/03/24   Raford Riggs, MD  ezetimibe (ZETIA) 10 MG tablet Take 10 mg by mouth daily.    [provider]  fluticasone  (FLONASE ) 50 MCG/ACT nasal spray  Place 1 spray into both nostrils daily as needed. For allergy nasal congestion 05/31/14   [provider]  hydrALAZINE  (APRESOLINE ) 25 MG tablet Take 25 mg by mouth 3 (three) times daily.    [provider]  HYDROcodone -acetaminophen  (NORCO/VICODIN) 5-325 MG tablet Take 1 tablet by mouth every 6 (six) hours as needed.    [provider]  promethazine  (PHENERGAN ) 25 MG tablet Take 1 tablet (25 mg total) by mouth daily as needed. 09/20/16   Mesner, Selinda, MD    ___________________________________________________________________________________________________ Physical Exam:    05/17/2024    6:30 PM 05/17/2024    6:15 PM 05/17/2024    6:00 PM  Vitals with BMI  Systolic  144   Diastolic  62   Pulse 82 73 75     1. General:  in No ***Acute distress***increased work of breathing ***complaining of severe pain****agitated * Chronically ill *well *cachectic *toxic acutely ill -appearing 2. Psychological: Alert and *** Oriented 3. Head/ENT:   Moist *** Dry Mucous Membranes                          Head Non traumatic, neck supple                          Normal *** Poor Dentition 4. SKIN: normal *** decreased Skin turgor,  Skin clean Dry and intact no rash     5. Heart: Regular rate and rhythm no*** Murmur, no Rub or gallop 6. Lungs: ***Clear to auscultation bilaterally, no wheezes or crackles   7. Abdomen: Soft, ***non-tender, Non distended *** obese ***bowel sounds present 8. Lower extremities: no clubbing, cyanosis, no ***edema 9. Neurologically Grossly intact, moving all 4 extremities equally *** strength 5 out of 5 in all 4 extremities cranial nerves II through XII intact 10. MSK: Normal range of motion    Chart has been reviewed  ______________________________________________________________________________________________  Assessment/Plan  ***  Admitted for *** NSTEMI (non-ST elevated myocardial infarction) (HCC) ***    Present on Admission: **None**     No problem-specific Assessment & Plan notes found for this encounter.    Other plan as per orders.  DVT prophylaxis:  SCD *** Lovenox        Code Status:    Code Status: Prior FULL CODE *** DNR/DNI ***comfort care as per patient ***family  I had personally discussed CODE STATUS with patient and family*  ACP *** none has been reviewed ***   Family Communication:   Family not at  Bedside  plan of care was discussed on the phone with *** Son, Daughter, Wife, Husband, Sister, Brother , father, mother  Diet    Disposition Plan:   *** likely will need placement for rehabilitation                          Back to current facility when stable                            To home once workup is complete and patient is stable  ***Following barriers for discharge:                             Chest pain *** Stroke *** Syncope ***work up is complete  Electrolytes corrected                               Anemia corrected h/H stable                             Pain controlled with PO medications                               Afebrile, white count improving able to transition to PO antibiotics                             Will need to be able to tolerate  PO                            Will likely need home health, home O2, set up                           Will need consultants to evaluate patient prior to discharge                           Work of breathing improves       Consult Orders  (From admission, onward)           Start     Ordered   05/17/24 1831  Consult to hospitalist  Once       Provider:  (Not yet assigned)  Question Answer Comment  Place call to: [facility]   Reason for Consult Admit      05/17/24 1830                              ***Would benefit from PT/OT eval prior to DC  Ordered                   Swallow eval - SLP ordered                   Diabetes care coordinator                   Transition of care consulted                   Nutrition    consulted                  Wound care  consulted                   Palliative care    consulted                   Behavioral health  consulted                    Consults called: ***  NONE   Admission status:  ED Disposition     ED Disposition  Admit   Condition  --   Comment  The patient appears reasonably stabilized for admission considering the current resources, flow, and capabilities available in the ED at this time, and I doubt any other [facility] requiring further screening and/or treatment in the ED prior to admission  is  present.           Obs***  ***  inpatient     I Expect 2 midnight stay secondary to severity of patient's current illness need for inpatient interventions justified by the following: ***hemodynamic instability despite optimal treatment (tachycardia *hypotension * tachypnea *hypoxia, hypercapnia) *** Severe lab/radiological/exam abnormalities including:    NSTEMI (non-ST elevated myocardial infarction) (HCC) ***  and extensive comorbidities including: *substance abuse  *Chronic pain *DM2  * CHF * CAD  * COPD/asthma *Morbid Obesity * CKD *dementia *liver disease *history of stroke with residual deficits *   malignancy, * sickle cell disease  History of amputation Chronic anticoagulation  That are currently affecting medical management.   I expect  patient to be hospitalized for 2 midnights requiring inpatient medical care.  Patient is at high risk for adverse outcome (such as loss of life or disability) if not treated.  Indication for inpatient stay as follows:  Severe change from baseline regarding mental status Hemodynamic instability despite maximal medical therapy,  severe pain requiring acute inpatient management,  inability to maintain oral hydration   persistent chest pain despite medical management Need for operative/procedural  intervention New or worsening hypoxia ongoing suicidal ideations   Need for IV antibiotics, IV fluids,,     Level of care       progressive    Yasir Kitner 05/17/2024, 7:21 PM ***  [facility]     after 2 AM please page floor coverage   If 7AM-7PM, please contact the day team taking care of the patient using [email or web address]  hemodynamic instability despite optimal treatment (tachycardia tachypnea  hypoxia,  )   Severe lab/radiological/exam abnormalities including:    NSTEMI, sepsis and extensive comorbidities including:  DM2   CHF  COPD/asthma.   Obesity CKD  That are currently affecting medical management.  I expect  patient to be hospitalized for 2 midnights requiring inpatient medical care.  Patient is at high risk for adverse outcome (such as loss of life or disability) if not treated.  Indication for inpatient stay as follows:  Severe change from baseline regarding mental status Hemodynamic instability despite maximal medical therapy,    persistent chest pain despite medical management Need for operative/procedural  intervention  Need for IV antibiotics, IV fluids,,     Level of care       progressive    Lamberto Dinapoli 05/17/2024, 1:28 AM    [facility]     after 2 AM please page floor coverage   If 7AM-7PM, please contact the day team taking care of the patient using [email or web address]

## 2024-05-17 NOTE — Assessment & Plan Note (Signed)
 Will replace

## 2024-05-17 NOTE — Assessment & Plan Note (Signed)
 Obtain electrolytes monitor urine function  -chronic avoid nephrotoxic medications such as NSAIDs, Vanco Zosyn combo,  avoid hypotension, continue to follow renal function

## 2024-05-17 NOTE — Subjective & Objective (Signed)
 Since Saturday has been having dyspnea, CP radiating to her neck She thought it was GERD and went to her GI  Was found to be hypoxic mid 83% on RA Improved on 2 L  Febrile to 100.4 Initial trop 250 repeat 400  Continues to have chest pain Blood cultures ordered   Started on broad spectrum abx cefepime/ vanc and flagyl   Cardiology consulted Dr. Wonda  Agreed with Heparin  hold off on cardiac cath given AKI  Careful rehydration

## 2024-05-17 NOTE — Assessment & Plan Note (Signed)
Order sliding scale  

## 2024-05-17 NOTE — ED Notes (Signed)
 Pt given turkey sandwich bag, 2 ginger ales, and one cup of ice

## 2024-05-17 NOTE — ED Provider Notes (Signed)
 [facility] Provider Note   CSN: [id number] Arrival date & time: 05/17/24  1352     History Chief Complaint  Patient presents with   Shortness of Breath   Chest Pain   Gastroesophageal Reflux   Anxiety   HPI: Shelly [name] is a 68 y.o. female with history perinent for hyperlipidemia, HTN, CKD, T2DM, HFpEF who presents complaining of chest pain. Patient arrived via POV.  History provided by patient.  No interpreter required during this encounter.  Patient reports that she has chronic shortness of breath, however reports that it has been more prominent since 11/1, and she also had epigastric discomfort with a burning sensation radiating into her chest.  Reports that she thought that it was her prior GERD.  Given progression of her symptoms she went to see her gastroenterologist today, who found that she was hypoxic on room air to the low to mid 80s, therefore sent her to the emergency department for further evaluation.  Patient denies fever, chills, nausea, vomiting, diarrhea, admits ongoing shortness of breath, chest pain, epigastric discomfort.  Patient's recorded medical, surgical, social, medication list and allergies were reviewed in the Snapshot window as part of the initial history.   Prior to Admission medications   Medication Sig Start Date End Date Taking? Authorizing Provider  albuterol  (PROVENTIL ) (2.5 MG/3ML) 0.083% nebulizer solution Take 3 mLs (2.5 mg total) by nebulization every 6 (six) hours as needed for wheezing or shortness of breath. 07/26/19  Yes Meade Verdon RAMAN, MD  ALPRAZolam  (XANAX ) 0.5 MG tablet Take 0.5 mg by mouth 3 (three) times daily as needed. Anxiety   Yes [provider]  carvedilol  (COREG ) 25 MG tablet Take 25 mg by mouth 2 (two) times daily with a meal.   Yes [provider]  cloNIDine  (CATAPRES  - DOSED IN MG/24 HR) 0.2 mg/24hr patch Place 0.2 mg onto the skin once a week.   Yes [provider]  COMBIVENT RESPIMAT 20-100 MCG/ACT AERS respimat Take 1 puff by mouth every 6 (six) hours as needed for shortness of breath. 07/23/16  Yes [provider]  estradiol (ESTRACE) 0.1 MG/GM vaginal cream Place 1 Applicatorful vaginally 2 (two) times a week. 01/12/24  Yes [provider]  ezetimibe (ZETIA) 10 MG tablet Take 10 mg by mouth daily.   Yes [provider]  famotidine  (PEPCID ) 40 MG tablet Take 40 mg by mouth at bedtime.   Yes [provider]  fluticasone  (FLONASE ) 50 MCG/ACT nasal spray Place 1 spray into both nostrils daily as needed. For allergy nasal congestion 05/31/14  Yes [provider]  HYDROcodone -acetaminophen  (NORCO/VICODIN) 5-325 MG tablet Take 0.5 tablets by mouth every 6 (six) hours as needed for moderate pain (pain score 4-6).   Yes [provider]  melatonin 5 MG TABS Take 5 mg by mouth at bedtime.   Yes [provider]  pantoprazole  (PROTONIX ) 40 MG tablet Take 40 mg by mouth daily.   Yes [provider]  promethazine  (PHENERGAN ) 25 MG tablet Take 1 tablet (25 mg total) by mouth daily as needed. Patient taking differently: Take 25 mg by mouth daily as needed for refractory nausea / vomiting. 09/20/16  Yes Mesner, Selinda, MD  aspirin  EC 81 MG tablet Take 1 tablet (81 mg total) by mouth daily. Swallow whole. 05/23/24   Ghimire, Donalda HERO, MD  ferrous sulfate 325 (65 FE) MG tablet Take 1 tablet (325 mg total) by mouth daily with breakfast. 05/23/24  Ghimire, Donalda HERO, MD  furosemide  (LASIX ) 40 MG tablet Take 1 tablet (40 mg total) by mouth daily. 05/23/24   Ghimire, Donalda HERO, MD  hydrALAZINE  (APRESOLINE ) 50 MG tablet Take 1 tablet (50 mg total) by mouth 3 (three) times daily. 05/23/24   Ghimire, Donalda HERO, MD  polyethylene glycol powder (GLYCOLAX /MIRALAX ) 17 GM/SCOOP powder Take 17 g by mouth daily. Dissolve 1 capful (17g) in 4-8 ounces of liquid and take by mouth daily. 05/23/24   Ghimire,  Donalda HERO, MD  senna-docusate (SENOKOT-S) 8.6-50 MG tablet Take 2 tablets by mouth at bedtime. 05/23/24   Ghimire, Donalda HERO, MD     Allergies: Singulair  [montelukast  sodium], Amlodipine  besylate, Atorvastatin, Lisinopril, Metformin, Penicillins, Sulfa antibiotics, Welchol  [colesevelam hcl], Dilaudid  [hydromorphone  hcl], and Sulfamethoxazole-trimethoprim   Review of Systems   ROS as per HPI  Physical Exam Updated Vital Signs BP (!) 148/46 (BP Location: Right Arm)   Pulse 66   Temp 97.9 F (36.6 C) (Oral)   Resp 12   Ht 5' 2 (1.575 m)   Wt 81.2 kg   SpO2 94%   BMI 32.74 kg/m  Physical Exam Vitals and nursing note reviewed.  Constitutional:      General: She is not in acute distress.    Appearance: She is well-developed.  HENT:     Head: Normocephalic and atraumatic.  Eyes:     Conjunctiva/sclera: Conjunctivae normal.  Cardiovascular:     Rate and Rhythm: Normal rate and regular rhythm.     Heart sounds: No murmur heard. Pulmonary:     Effort: Pulmonary effort is normal. No respiratory distress.     Breath sounds: Normal breath sounds.     Comments: Saturating well on 2 L nasal cannula Abdominal:     Palpations: Abdomen is soft.     Tenderness: There is no abdominal tenderness.  Musculoskeletal:        General: No swelling.     Cervical back: Neck supple.  Skin:    General: Skin is warm and dry.     Capillary Refill: Capillary refill takes less than 2 seconds.  Neurological:     Mental Status: She is alert.  Psychiatric:        Mood and Affect: Mood normal.     ED Course/ Medical Decision Making/ A&P   Procedures .Critical Care  Performed by: Rogelia Jerilynn RAMAN, MD Authorized by: Rogelia Jerilynn RAMAN, MD   Critical care provider statement:    Critical care time (minutes):  30   Critical care was necessary to treat or prevent imminent or life-threatening deterioration of the following conditions:  Cardiac failure (NSTEMI requiring heparin  GGT)   Critical  care was time spent personally by me on the following activities:  Development of treatment plan with patient or surrogate, discussions with consultants, evaluation of patient's response to treatment, examination of patient, ordering and review of laboratory studies, ordering and review of radiographic studies, ordering and performing treatments and interventions, pulse oximetry, re-evaluation of patient's condition and review of old charts   I assumed direction of critical care for this patient from another provider in my specialty: no     Care discussed with: admitting provider      Medications Ordered in ED Medications  lactated ringers  infusion (0 mLs Intravenous Stopped 05/18/24 0100)  lactated ringers  bolus 1,000 mL (0 mLs Intravenous Stopped 05/17/24 1727)  ceFEPIme (MAXIPIME) 2 g in sodium chloride  0.9 % 100 mL IVPB (0 g Intravenous Stopped 05/17/24 1649)  metroNIDAZOLE  (FLAGYL )  IVPB 500 mg (0 mg Intravenous Stopped 05/17/24 1727)  vancomycin (VANCOCIN) IVPB 1000 mg/200 mL premix (0 mg Intravenous Stopped 05/17/24 1726)  aspirin  chewable tablet 324 mg (324 mg Oral Given 05/17/24 1620)  heparin  bolus via infusion 4,000 Units (4,000 Units Intravenous Bolus from Bag 05/17/24 1811)  magnesium  sulfate IVPB 2 g 50 mL (0 g Intravenous Stopped 05/18/24 0122)  cefTRIAXone  (ROCEPHIN ) 1 g in sodium chloride  0.9 % 100 mL IVPB (0 g Intravenous Stopped 05/19/24 1046)  furosemide  (LASIX ) injection 60 mg (60 mg Intravenous Given 05/18/24 1415)  hydrALAZINE  (APRESOLINE ) tablet 25 mg (25 mg Oral Given 05/19/24 1019)  polyethylene glycol (MIRALAX  / GLYCOLAX ) packet 17 g (17 g Oral Given 05/21/24 2243)  furosemide  (LASIX ) injection 60 mg (60 mg Intravenous Given 05/22/24 1718)    Medical Decision Making:   JAINE ESTABROOKS is a 68 y.o. female who presents for shortness of breath and chest pain as per above.  Physical exam is pertinent for well on 2 L nasal cannula.   The differential includes but is not limited to  sepsis, pneumonia, ACS, arrhythmia, pericardial tamponade, pericarditis, myocarditis, pneumonia, pneumothorax, esophageal, tear, perforated abdominal viscous, pulmonary embolism, aortic dissection, costochondritis, musculoskeletal chest wall pain, GERD.  Independent historian: None  External data reviewed: Labs: reviewed prior labs for baseline  Labs: Ordered, Independent interpretation, and Details: VBG without acidosis or hypercarbia. BMP with AKI to 2.6 from baseline of approximately 2.1, symmetric elevation of BUN.  CBC with leukocytosis to 15, stable anemia, no thrombocytopenia.  Initial troponin elevated at 251, delta 489.  BNP mildly elevated at 740.  COVID/flu/RSV negative.  UA concerning for UTI with positive nitrites, LE, bacteria, though no significant WBCs.  Lactic acid WNL.  Lipase WNL.  Hepatic function panel without emergent derangement  Radiology: Ordered, Independent interpretation, and Details: Chest x-ray with bilateral costophrenic angle blunting concerning for infectious versus hypervolemia related pleural effusions DG Chest 2 View Result Date: 05/17/2024 CLINICAL DATA:  Shortness of breath and chest pain. EXAM: CHEST - 2 VIEW COMPARISON:  03/06/2024 FINDINGS: Lungs are adequately inflated without focal lobar consolidation. Small amount right pleural fluid which is new and possible small amount left pleural fluid. Mild prominence of the central pulmonary vessels which may be due to mild degree of vascular congestion. Cardiomediastinal silhouette and remainder of the exam is unchanged. IMPRESSION: 1. New small right pleural effusion and possible small amount left pleural fluid. 2. Suggestion of mild vascular congestion. Electronically Signed   By: Toribio Agreste M.D.   On: 05/17/2024 15:14   EKG/Medicine tests: Ordered and Independent interpretation EKG Interpretation Date/Time:  Wednesday May 17 2024 14:05:52 EST Ventricular Rate:  89 PR Interval:  158 QRS Duration:  94 QT  Interval:  356 QTC Calculation: 433 R Axis:   60  Text Interpretation: Normal sinus rhythm Cannot rule out Anterior infarct , age undetermined Abnormal ECG When compared with ECG of 06-Mar-2024 06:36, PREVIOUS ECG IS PRESENT Confirmed by Rogelia Satterfield ([id number]) on 05/17/2024 4:07:11 PM                Interventions: Aspirin , cefepime, vancomycin, Flagyl , LR bolus  See the EMR for full details regarding lab and imaging results.  Patient initially presented to the emergency department for hypoxia in the setting of several days of epigastric discomfort radiating to the chest characteristic of patient's baseline GERD, as well as acute on chronic shortness of breath, was found to be hypoxic prior to arrival.  Patient continues to state on exam  that symptoms are consistent with her prior GERD, however prior to my bedside evaluation, patient underwent labs in triage including troponin which is significantly elevated at 251, which raises my concern for cardiac etiology of symptoms.  324 aspirin  administered.  Additionally patient was febrile on arrival, has new oxygen requirement, and leukocytosis on triage labs, therefore activated as a code sepsis, and received broad-spectrum antibiotics with cefepime, vancomycin, Flagyl  given unclear source of possible sepsis (patient has hypoxia which could represent a pulmonary source, however given she has abdominal pain radiating to her chest, abdominal source is not excluded).  Given 1 L LR bolus, however not given additional fluids such as 30 cc/kg bolus given patient has evidence of possible volume overload with elevated BNP, pleural effusions on chest x-ray, new oxygen requirement, thus feel that it would pose more risk than benefit particularly given patient has normal lactic acid.  Additional labs obtained, and patient has evidence of possible UTI, has AKI on CKD, and delta troponin continues to rise at 489, consistent with NSTEMI.  I did consult cardiology and spoke  with Dr. Wonda who agreed with heparin  GGT, recommended medicine admission given concurrent workup for sepsis, does not feel that patient warrants immediate cath, particularly in the setting of concurrent AKI.  Given sepsis and NSTEMI, hospitalist consulted for admission, discussed with Dr. Silvester who accepted the patient to her service.  Presentation is most consistent with acute life/limb-threatening illness and Current presentation is complicated by underlying chronic conditions  Discussion of management or test interpretations with external provider(s): Dr. Silvester, hospitalist, Dr. Wonda, cardiology  Risk Drugs:OTC drugs, Prescription drug management, and Drug therapy requiring intensive monitoring for toxicity Treatment: Decision regarding hospitalization Critical Care: 30 minutes  Disposition: ADMIT: I believe the patient requires admission for further care and management. The patient was admitted to hospitalist with a cardiology consult. Please see inpatient provider note for additional treatment plan details.   MDM generated using voice dictation software and may contain dictation errors.  Please contact me for any clarification or with any questions.  Clinical Impression:  1. NSTEMI (non-ST elevated myocardial infarction) (HCC)   2. Type 2 diabetes, HbA1c goal < 7% (HCC)   3. AKI (acute kidney injury)   4. Sepsis without acute organ dysfunction, due to unspecified organism (HCC)   5. Acute respiratory failure with hypoxia (HCC)      Admit   Final Clinical Impression(s) / ED Diagnoses Final diagnoses:  NSTEMI (non-ST elevated myocardial infarction) (HCC)  AKI (acute kidney injury)  Sepsis without acute organ dysfunction, due to unspecified organism [facility])  Acute respiratory failure with hypoxia (HCC)    Rx / DC Orders ED Discharge Orders          Ordered    hydrALAZINE  (APRESOLINE ) 50 MG tablet  3 times daily        05/23/24 0905    furosemide  (LASIX ) 40 MG tablet   Daily        05/23/24 0905    ferrous sulfate 325 (65 FE) MG tablet  Daily with breakfast        05/23/24 0905    aspirin  EC 81 MG tablet  Daily        05/23/24 0905    polyethylene glycol powder (GLYCOLAX /MIRALAX ) 17 GM/SCOOP powder  Daily        05/23/24 0905    senna-docusate (SENOKOT-S) 8.6-50 MG tablet  Daily at bedtime        05/23/24 0905    Increase activity slowly  05/23/24 0905    Diet - low sodium heart healthy        05/23/24 0905    Discharge instructions       Comments: Follow with Primary MD  Loreli Kins, MD in 1-2 weeks  You will follow-up with cardiology arranged-include a stress test scheduled for 11/13-their office will call you with instructions.  Please get a complete blood count and chemistry panel checked by your Primary MD at your next visit, and again as instructed by your Primary MD.  Get Medicines reviewed and adjusted: Please take all your medications with you for your next visit with your Primary MD  Laboratory/radiological data: Please request your Primary MD to go over all hospital tests and procedure/radiological results at the follow up, please ask your Primary MD to get all Hospital records sent to his/her office.  In some cases, they will be blood work, cultures and biopsy results pending at the time of your discharge. Please request that your primary care M.D. follows up on these results.  Also Note the following: If you experience worsening of your admission symptoms, develop shortness of breath, life threatening emergency, suicidal or homicidal thoughts you must seek medical attention immediately by calling 911 or calling your MD immediately  if symptoms less severe.  You must read complete instructions/literature along with all the possible adverse reactions/side effects for all the Medicines you take and that have been prescribed to you. Take any new Medicines after you have completely understood and accpet all the possible adverse  reactions/side effects.   Do not drive when taking Pain medications or sleeping medications (Benzodaizepines)  Do not take more than prescribed Pain, Sleep and Anxiety Medications. It is not advisable to combine anxiety,sleep and pain medications without talking with your primary care practitioner  Special Instructions: If you have smoked or chewed Tobacco  in the last 2 yrs please stop smoking, stop any regular Alcohol  and or any Recreational drug use.  Wear Seat belts while driving.  Please note: You were cared for by a hospitalist during your hospital stay. Once you are discharged, your primary care physician will handle any further medical issues. Please note that NO REFILLS for any discharge medications will be authorized once you are discharged, as it is imperative that you return to your primary care physician (or establish a relationship with a primary care physician if you do not have one) for your post hospital discharge needs so that they can reassess your need for medications and monitor your lab values.   05/23/24 0905    Diet Carb Modified        05/23/24 0905             Rogelia Jerilynn RAMAN, MD 05/24/24 [id number]

## 2024-05-17 NOTE — Assessment & Plan Note (Signed)
 In the setting of sepsis avoid over aggressive rehydration

## 2024-05-17 NOTE — Assessment & Plan Note (Addendum)
 Appreciate cardiology consult Trop 251- 489 - 910 In the setting of sepsis Continue heparin  cardiology has been consulted

## 2024-05-17 NOTE — Assessment & Plan Note (Signed)
 Obtain anemia panel  Transfuse for Hg <7 , rapidly dropping or  if symptomatic

## 2024-05-17 NOTE — Consult Note (Signed)
 Cardiology Consultation   Patient ID: Shelly [name] MRN: [id number]; DOB: [birth year]  Admit date: 05/17/2024 Date of Consult: 05/17/2024  PCP:  Shelly Kins, MD   [facility] Providers Cardiologist:  Annabella Scarce, MD        Patient Profile: Shelly [name] is a 68 y.o. female with a hx of NICM who is being seen 05/17/2024 for the evaluation of NSTEMI at the request of Dr Rogelia.  History of Present Illness: Shelly [name] has a history of malignant hypertension, very difficult to control.  She has heart failure with mildly reduced ejection fraction, nonischemic cardiomyopathy with no significant CAD from cardiac catheterization in 2015.  Comorbid conditions include stage IV chronic kidney disease, gastric adenoma, diabetes, and chronic anemia.  She has established with nephrology.  She has been maintained on multidrug therapy for her hypertension.  The patient recently had an echocardiogram 05/03/2024 that showed LVEF 55%, mild LVH, grade 1 diastolic dysfunction, normal RV function, severe left atrial dilatation, mild to moderate mitral regurgitation, and mild aortic stenosis with a mean gradient of 17 mmHg.  Initial labs show the patient's creatinine is 2.6, sodium 131, high-sensitivity troponin is elevated at 251 and delta troponin of 489, lactic acid is normal at 0.6, white blood cell count is 15,000 with a hemoglobin of 9 and hematocrit of 28, normal platelet count of 240,000.  The patient is interviewed in the emergency department.  There are no family members present.  I used to take care of her dad over 10 years ago and I remember her from those encounters.  She has been at the beach with friends and she started developing progressive shortness of breath and orthopnea.  She has felt like she has had a fever for the past few days.  No cough.  She has had some discomfort in the chest going in to the throat and she thought this was related to heartburn and acid reflux.   Sometimes exertion would bring on her symptoms.  She states that over the past few months her blood pressure has been very difficult to control.  She previously had been well-controlled but she went in for a skin procedure to remove melanoma and had markedly elevated systolic blood pressure of around 240 mmHg.  Ever since that time she has required multidrug antihypertensive therapy and has still run elevated blood pressures at home.  She denies abdominal pain, change in bowel habits, or lower extremity edema.     Past Medical History:  Diagnosis Date   Acid reflux    Anemia    Anxiety    Arthritis    Asthma    infrequent problems   Cardiomyopathy (HCC)    CHF (congestive heart failure) (HCC)    Chronic UTI (urinary tract infection)    Complication of anesthesia    I quit breathing during surgery 2008 - has had surgery since with no problems    Diabetes mellitus    Endometriosis    hx of    Headache    hx of migraines   Hepatitis B    hx of dx during ruptured appendix hospitalization   Hyperlipemia    Hypertension    IBS (irritable bowel syndrome)    Irritable bowel syndrome    Kidney disease    Obesity    Pneumonia 12/2013   Pneumonia    PONV (postoperative nausea and vomiting)    Rash    sides of neck   Stress incontinence    Tuberculosis  1985    Past Surgical History:  Procedure Laterality Date   ABDOMINAL ADHESION SURGERY  1989   x 7   ABDOMINAL HYSTERECTOMY  01/1996   APPENDECTOMY  1987   BREAST BIOPSY Right 05/18/2022   MM RT BREAST BX W LOC DEV 1ST LESION IMAGE BX SPEC STEREO GUIDE 05/18/2022 [facility]   CHOLECYSTECTOMY  11/24/2000   COLON SURGERY  2010   colon resection due to adhesions   LAPAROSCOPIC LYSIS OF ADHESIONS  07/1991   pelvic adhesions   LEFT AND RIGHT HEART CATHETERIZATION WITH CORONARY ANGIOGRAM N/A 06/29/2014   Procedure: LEFT AND RIGHT HEART CATHETERIZATION WITH CORONARY ANGIOGRAM;  Surgeon: Candyce GORMAN Reek, MD;  Location: [facility]  CATH LAB;  Service: Cardiovascular;  Laterality: N/A;   OOPHORECTOMY Right 04/1995   OOPHORECTOMY Left 12/1992   removed left ovary   PARTIAL KNEE ARTHROPLASTY Left 10/27/2013   Procedure: LEFT KNEE UNICOMPARTMENTAL REPLACEMENT ;  Surgeon: Dempsey LULLA Moan, MD;  Location: [facility];  Service: Orthopedics;  Laterality: Left;   PARTIAL KNEE ARTHROPLASTY Right 05/21/2014   Procedure: MEDIAL UNICOMPARTMENTAL RIGHT KNEE ARTHROPLASTY;  Surgeon: Dempsey Moan LULLA, MD;  Location: [facility];  Service: Orthopedics;  Laterality: Right;   TONSILLECTOMY       Home Medications:  Prior to Admission medications   Medication Sig Start Date End Date Taking? Authorizing Provider  albuterol  (PROVENTIL ) (2.5 MG/3ML) 0.083% nebulizer solution Take 3 mLs (2.5 mg total) by nebulization every 6 (six) hours as needed for wheezing or shortness of breath. 07/26/19   Desai, Nikita S, MD  ALPRAZolam  (XANAX ) 0.5 MG tablet Take 0.5 mg by mouth 3 (three) times daily as needed. Anxiety    [provider]  carvedilol  (COREG ) 25 MG tablet Take 25 mg by mouth 2 (two) times daily with a meal.    [provider]  cloNIDine  (CATAPRES  - DOSED IN MG/24 HR) 0.2 mg/24hr patch Place 0.2 mg onto the skin once a week.    [provider]  cloNIDine  (CATAPRES ) 0.1 MG tablet Take 1 tablet (0.1 mg total) by mouth 2 (two) times daily. 04/03/24   Raford Riggs, MD  COMBIVENT RESPIMAT 20-100 MCG/ACT AERS respimat Take 1 puff by mouth daily. 07/23/16   [provider]  doxazosin  (CARDURA ) 2 MG tablet Take 1 tablet (2 mg total) by mouth daily. 04/03/24   Raford Riggs, MD  ezetimibe (ZETIA) 10 MG tablet Take 10 mg by mouth daily.    [provider]  fluticasone  (FLONASE ) 50 MCG/ACT nasal spray Place 1 spray into both nostrils daily as needed. For allergy nasal congestion 05/31/14   [provider]  hydrALAZINE  (APRESOLINE ) 25 MG tablet Take 25 mg by mouth 3 (three) times daily.    [provider]  HYDROcodone -acetaminophen  (NORCO/VICODIN) 5-325 MG tablet Take 1 tablet by mouth every 6 (six) hours as needed.    [provider]  promethazine  (PHENERGAN ) 25 MG tablet Take 1 tablet (25 mg total) by mouth daily as needed. 09/20/16   Mesner, Selinda, MD    Scheduled Meds:  Continuous Infusions:  heparin  1,100 Units/hr (05/17/24 1811)   lactated ringers  150 mL/hr at 05/17/24 1737   PRN Meds: nitroGLYCERIN   Allergies:    Allergies  Allergen Reactions   Singulair  [Montelukast  Sodium] Other (See Comments)    Vivid dreams   Amlodipine  Besylate     Other reaction(s): fatgiue and nausea   Atorvastatin     Other reaction(s): strange feelings   Lisinopril Cough   Metformin Diarrhea  Penicillins Other (See Comments)    Other reaction(s): rash Unknown reaction.    Sulfa Antibiotics Nausea And Vomiting   Welchol  [Colesevelam Hcl]     Other reaction(s): constipation   Dilaudid  [Hydromorphone  Hcl] Anxiety and Other (See Comments)    paranoia   Sulfamethoxazole-Trimethoprim Rash    Social History:   Social History   Socioeconomic History   Marital status: Divorced    Spouse name: Not on file   Number of children: Not on file   Years of education: Not on file   Highest education level: Not on file  Occupational History   Not on file  Tobacco Use   Smoking status: Never   Smokeless tobacco: Never  Substance and Sexual Activity   Alcohol use: Yes    Comment: occ   Drug use: No   Sexual activity: Not on file  Other Topics Concern   Not on file  Social History Narrative   Not on file   Social Drivers of Health   Financial Resource Strain: Not on file  Food Insecurity: Low Risk  (01/12/2024)   Received from [facility]   Hunger Vital Sign    Within the past 12 months, you worried that your food would run out before you got money to buy more: Never true    Within the past 12 months, the food you bought just didn't last and you didn't have money to  get more. : Never true  Transportation Needs: No Transportation Needs (01/12/2024)   Received from [facility]    In the past 12 months, has lack of reliable transportation kept you from medical appointments, meetings, work or from getting things needed for daily living? : No  Physical Activity: Not on file  Stress: Not on file  Social Connections: Not on file  Intimate Partner Violence: Not on file    Family History:   Family History  Problem Relation Age of Onset   Hypertension Mother    Heart failure Mother    Stroke Mother    Heart disease Father    Diabetes Father    Liver cancer Brother    Heart attack Maternal Grandmother    Hypertension Maternal Grandmother    Heart attack Paternal Grandmother    Breast cancer Neg Hx      ROS:  Please see the history of present illness.  All other ROS reviewed and negative.     Physical Exam/Data: Vitals:   05/17/24 1404 05/17/24 1540 05/17/24 1541 05/17/24 1545  BP:  (!) 161/64  (!) 165/75  Pulse:  86 83 79  Resp:  (!) 22  (!) 22  Temp:      TempSrc:      SpO2:  (S) (!) 89% (S) 97% 98%  Weight: 85.3 kg     Height: 5' 2 (1.575 m)       Intake/Output Summary (Last 24 hours) at 05/17/2024 1853 Last data filed at 05/17/2024 1727 Gross per 24 hour  Intake 1354.88 ml  Output --  Net 1354.88 ml      05/17/2024    2:04 PM 04/03/2024    8:15 AM 03/30/2024    2:29 PM  Last 3 Weights  Weight (lbs) 188 lb 180 lb 185 lb  Weight (kg) 85.276 kg 81.647 kg 83.915 kg     Body mass index is 34.39 kg/m.  General:  Well nourished, well developed, in no acute distress HEENT: normal Neck: no JVD Vascular: No carotid bruits; Distal  pulses 2+ bilaterally Cardiac:  normal S1, S2; RRR; 2/6 early peaking systolic ejection murmur at the right upper sternal border Lungs:  clear to auscultation bilaterally, no wheezing, rhonchi or rales  Abd: soft, nontender, no hepatomegaly  Ext: no edema Musculoskeletal:  No deformities,  BUE and BLE strength normal and equal Skin: warm and dry  Neuro:  CNs 2-12 intact, no focal abnormalities noted Psych:  Normal affect   EKG:  The EKG was personally reviewed and demonstrates: Normal sinus rhythm 89 bpm, possible age-indeterminate anterior infarct, ST and T wave abnormality consider inferolateral ischemia unchanged from baseline  Telemetry:  Telemetry was personally reviewed and demonstrates: Sinus rhythm without arrhythmia  Relevant CV Studies: Cardiac Studies & Procedures   ______________________________________________________________________________________________     ECHOCARDIOGRAM  ECHOCARDIOGRAM COMPLETE 05/03/2024  Narrative ECHOCARDIOGRAM REPORT    Patient Name:   Shelly [name] Raynor Date of Exam: 05/03/2024 Medical Rec #:  [id number]       Height:       62.0 in Accession #:    [id number]      Weight:       180.0 lb Date of Birth:  1955/10/24       BSA:          1.828 m Patient Age:    68 years        BP:           165/74 mmHg Patient Gender: F               HR:           65 bpm. Exam Location:  [address]  Procedure: 2D Echo, Cardiac Doppler, Color Doppler and 3D Echo (Both Spectral and Color Flow Doppler were utilized during procedure).  Indications:    Dyspnea R06.00  History:        Patient has prior history of Echocardiogram examinations, most recent 11/23/2019. CHF, Aortic Valve Disease; Risk Factors:Dyslipidemia, Diabetes and Hypertension.  Sonographer:    Augustin Seals RDCS Referring Phys: [id number] [facility]  IMPRESSIONS   1. Left ventricular ejection fraction by 3D volume is 55 %. The left ventricle has normal function. The left ventricle has no regional wall motion abnormalities. The left ventricular internal cavity size was mildly dilated. There is mild concentric left ventricular hypertrophy. Left ventricular diastolic parameters are consistent with Grade I diastolic dysfunction (impaired relaxation). 2. Right ventricular  systolic function is normal. The right ventricular size is normal. 3. Left atrial size was severely dilated. 4. The mitral valve is normal in structure. Mild to moderate mitral valve regurgitation. No evidence of mitral stenosis. 5. The aortic valve is normal in structure. Aortic valve regurgitation is not visualized. Mild aortic valve stenosis. Aortic valve area, by VTI measures 1.61 cm. Aortic valve mean gradient measures 17.0 mmHg. Aortic valve Vmax measures 2.72 m/s. 6. The inferior vena cava is dilated in size with <50% respiratory variability, suggesting right atrial pressure of 15 mmHg.  FINDINGS Left Ventricle: Left ventricular ejection fraction by 3D volume is 55 %. The left ventricle has normal function. The left ventricle has no regional wall motion abnormalities. The left ventricular internal cavity size was mildly dilated. There is mild concentric left ventricular hypertrophy. Left ventricular diastolic parameters are consistent with Grade I diastolic dysfunction (impaired relaxation).  Right Ventricle: The right ventricular size is normal. No increase in right ventricular wall thickness. Right ventricular systolic function is normal.  Left [facility]: Left atrial size was severely dilated.  Right [facility]: Right atrial  size was normal in size.  Pericardium: There is no evidence of pericardial effusion.  Mitral Valve: The mitral valve is normal in structure. Mild mitral annular calcification. Mild to moderate mitral valve regurgitation. No evidence of mitral valve stenosis.  Tricuspid Valve: The tricuspid valve is normal in structure. Tricuspid valve regurgitation is mild . No evidence of tricuspid stenosis.  Aortic Valve: The aortic valve is normal in structure. Aortic valve regurgitation is not visualized. Mild aortic stenosis is present. Aortic valve mean gradient measures 17.0 mmHg. Aortic valve peak gradient measures 29.5 mmHg. Aortic valve area, by VTI measures 1.61  cm.  Pulmonic Valve: The pulmonic valve was normal in structure. Pulmonic valve regurgitation is not visualized. No evidence of pulmonic stenosis.  Aorta: The aortic root is normal in size and structure.  Venous: The inferior vena cava is dilated in size with less than 50% respiratory variability, suggesting right atrial pressure of 15 mmHg.  IAS/Shunts: No atrial level shunt detected by color flow Doppler.  Additional Comments: 3D was performed not requiring image post processing on an independent workstation and was normal.   LEFT VENTRICLE PLAX 2D LVIDd:         6.00 cm         Diastology LVIDs:         4.40 cm         LV e' medial:    3.66 cm/s LV PW:         1.20 cm         LV E/e' medial:  29.5 LV IVS:        1.00 cm         LV e' lateral:   3.49 cm/s LVOT diam:     2.50 cm         LV E/e' lateral: 30.9 LV SV:         119 LV SV Index:   65 LVOT Area:     4.91 cm        3D Volume EF LV 3D EF:    Left ventricul ar ejection fraction by 3D volume is 55 %.  3D Volume EF: 3D EF:        55 % LV EDV:       183 ml LV ESV:       82 ml LV SV:        101 ml  RIGHT VENTRICLE            IVC RV Basal diam:  3.40 cm    IVC diam: 2.50 cm RV Mid diam:    2.60 cm RV S prime:     9.96 cm/s TAPSE (M-mode): 2.1 cm  LEFT ATRIUM             Index        RIGHT ATRIUM           Index LA diam:        4.50 cm 2.46 cm/m   RA Area:     11.60 cm LA Vol (A2C):   75.9 ml 41.52 ml/m  RA Volume:   25.90 ml  14.17 ml/m LA Vol (A4C):   91.7 ml 50.16 ml/m LA Biplane Vol: 87.0 ml 47.59 ml/m AORTIC VALVE AV Area (Vmax):    1.66 cm AV Area (Vmean):   1.58 cm AV Area (VTI):     1.61 cm AV Vmax:           271.50 cm/s AV Vmean:  196.500 cm/s AV VTI:            0.736 m AV Peak Grad:      29.5 mmHg AV Mean Grad:      17.0 mmHg LVOT Vmax:         91.70 cm/s LVOT Vmean:        63.400 cm/s LVOT VTI:          0.242 m LVOT/AV VTI ratio: 0.33  AORTA Ao Root diam: 3.30 cm Ao Asc  diam:  3.30 cm  MITRAL VALVE MV Area (PHT): 2.73 cm     SHUNTS MV Decel Time: 278 msec     Systemic VTI:  0.24 m MV E velocity: 108.00 cm/s  Systemic Diam: 2.50 cm MV A velocity: 144.00 cm/s MV E/A ratio:  0.75  Kardie Tobb DO Electronically signed by Dub Huntsman DO Signature Date/Time: 05/03/2024/4:17:30 PM    Final          ______________________________________________________________________________________________       Laboratory Data: High Sensitivity Troponin:   Recent Labs  Lab 05/17/24 1408 05/17/24 1556  TROPONINIHS 251* 489*     Chemistry Recent Labs  Lab 05/17/24 1408  NA 131*  K 5.1  CL 103  CO2 18*  GLUCOSE 204*  BUN 47*  CREATININE 2.62*  CALCIUM  8.1*  GFRNONAA 19*  ANIONGAP 10    Recent Labs  Lab 05/17/24 1556  PROT 6.7  ALBUMIN 2.9*  AST 14*  ALT 11  ALKPHOS 51  BILITOT 0.9   Lipids No results for input(s): CHOL, TRIG, HDL, LABVLDL, LDLCALC, CHOLHDL in the last 168 hours.  Hematology Recent Labs  Lab 05/17/24 1408  WBC 15.0*  RBC 2.92*  HGB 9.0*  HCT 28.0*  MCV 95.9  MCH 30.8  MCHC 32.1  RDW 12.7  PLT 240   Thyroid  No results for input(s): TSH, FREET4 in the last 168 hours.  BNPNo results for input(s): BNP, PROBNP in the last 168 hours.  DDimer No results for input(s): DDIMER in the last 168 hours.  Radiology/Studies:  DG Chest 2 View Result Date: 05/17/2024 CLINICAL DATA:  Shortness of breath and chest pain. EXAM: CHEST - 2 VIEW COMPARISON:  03/06/2024 FINDINGS: Lungs are adequately inflated without focal lobar consolidation. Small amount right pleural fluid which is new and possible small amount left pleural fluid. Mild prominence of the central pulmonary vessels which may be due to mild degree of vascular congestion. Cardiomediastinal silhouette and remainder of the exam is unchanged. IMPRESSION: 1. New small right pleural effusion and possible small amount left pleural fluid. 2. Suggestion of  mild vascular congestion. Electronically Signed   By: Toribio Agreste M.D.   On: 05/17/2024 15:14     Assessment and Plan: Non-STEMI: Elevated high-sensitivity troponin noted.  Patient also with a somewhat confusing clinical picture with fever and leukocytosis.  She does have symptoms of chest discomfort and I think this will warrant further evaluation with cardiac catheterization.  I agree with IV heparin  and aspirin .  She is chest pain-free at present.  She does have a history of patent coronary arteries with no CAD at cardiac catheterization in 2015 and I reviewed these results today.  However, it has been 10 years and with her diabetes she is certainly at risk of progressive disease.  Will have to follow her renal function before committing her to cardiac catheterization as she will be at risk of AKI and will also have to factor in her overall clinical course with respect to  her febrile illness and other comorbid conditions. Acute on chronic HFpEF: Recent echo showed normal LV EF with mild LVH, but suspect hypertensive heart disease and possibly ischemic heart disease have resulted in patient's symptoms of heart failure with orthopnea and progressive dyspnea.  There were also concerns about the possibility of pneumonia with her fever, but her chest x-ray shows pleural effusions and vascular congestion.  Will hold on IV diuresis at present as she is oxygenating well and has AKI +/- sepsis.  Check BNP.  Avoid aggressive volume resuscitation or hydration as she is not hypotensive or tachycardic. 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  As above, avoid aggressive diuresis.  Will follow-up creatinine tomorrow.  Cardiology team will follow.  Continue IV heparin  and anticipate cardiac catheterization once creatinine is trending down and there is more clarity regarding sepsis and other comorbidities.   Risk Assessment/Risk Scores:    TIMI Risk Score for Unstable Angina or Non-ST Elevation MI:   The patient's TIMI risk  score is 4, which indicates a 20% risk of all cause mortality, new or recurrent myocardial infarction or need for urgent revascularization in the next 14 days.  New [place]  Heart Association (NYHA) Functional Class NYHA Class III       For questions or updates, please contact [facility] Please consult [email or web address] for contact info under      Signed, [name], MD  05/17/2024 6:53 PM

## 2024-05-17 NOTE — ED Provider Triage Note (Signed)
 Emergency Medicine Provider Triage Evaluation Note  Shelly [name] , a 68 y.o. female  was evaluated in triage.  Pt complains of shortness of breath x 5 days accompanied with globus sensation in esophagus, reporting neck pain, bilateral shoulder pain. Seen by GI and told to come here secondary to dyspnea. Took gas pills without relief.   Endorses cough, dysphagia tolerating fluids and food, belching, bloating, lower abdominal discomfort. Recently treated with keflex for UTI.   Denies vision changes, odynophagia, congestion, chest pain, diarrhea, melena, hematochezia, hematuria, dysuria, LE swelling  Review of Systems  Positive: N/a Negative: N/a  Physical Exam  BP (!) 178/80 (BP Location: Left Arm)   Pulse 91   Temp (!) 100.4 F (38 C) (Oral)   Resp 18   Ht 5' 2 (1.575 m)   Wt 85.3 kg   SpO2 94%   BMI 34.39 kg/m  Gen:   Awake, no distress   Resp:  Normal effort  MSK:   Moves extremities without difficulty  Other:    Medical Decision Making  Medically screening exam initiated at 2:49 PM.  Appropriate orders placed.  Shelly [name] was informed that the remainder of the evaluation will be completed by another provider, this initial triage assessment does not replace that evaluation, and the importance of remaining in the ED until their evaluation is complete.     Shelly [name], [place] 05/17/24 1454

## 2024-05-17 NOTE — ED Notes (Signed)
 Called lab to add urine creatinine, osmolality, and sodium to patients urine sample. Per lab papers of the order need to be sent down. Papers sent to the lab.   Lab also called about resp 20 pathogens panel. Per lab they will add it on.

## 2024-05-18 ENCOUNTER — Inpatient Hospital Stay (HOSPITAL_COMMUNITY)

## 2024-05-18 ENCOUNTER — Encounter (HOSPITAL_COMMUNITY): Payer: Self-pay | Admitting: Internal Medicine

## 2024-05-18 DIAGNOSIS — I5033 Acute on chronic diastolic (congestive) heart failure: Secondary | ICD-10-CM

## 2024-05-18 DIAGNOSIS — R109 Unspecified abdominal pain: Secondary | ICD-10-CM | POA: Diagnosis present

## 2024-05-18 DIAGNOSIS — J189 Pneumonia, unspecified organism: Secondary | ICD-10-CM | POA: Diagnosis present

## 2024-05-18 DIAGNOSIS — J441 Chronic obstructive pulmonary disease with (acute) exacerbation: Secondary | ICD-10-CM | POA: Diagnosis present

## 2024-05-18 DIAGNOSIS — N390 Urinary tract infection, site not specified: Secondary | ICD-10-CM | POA: Diagnosis present

## 2024-05-18 DIAGNOSIS — I5022 Chronic systolic (congestive) heart failure: Secondary | ICD-10-CM | POA: Diagnosis not present

## 2024-05-18 LAB — GLUCOSE, CAPILLARY
Glucose-Capillary: 160 mg/dL — ABNORMAL HIGH (ref 70–99)
Glucose-Capillary: 281 mg/dL — ABNORMAL HIGH (ref 70–99)
Glucose-Capillary: 254 mg/dL — ABNORMAL HIGH (ref 70–99)
Glucose-Capillary: 188 mg/dL — ABNORMAL HIGH (ref 70–99)

## 2024-05-18 LAB — BASIC METABOLIC PANEL WITH GFR
Anion gap: 10 (ref 5–15)
BUN: 37 mg/dL — ABNORMAL HIGH (ref 8–23)
CO2: 17 mmol/L — ABNORMAL LOW (ref 22–32)
Calcium: 8.2 mg/dL — ABNORMAL LOW (ref 8.9–10.3)
Chloride: 106 mmol/L (ref 98–111)
Creatinine, Ser: 2.37 mg/dL — ABNORMAL HIGH (ref 0.44–1.00)
GFR, Estimated: 22 mL/min — ABNORMAL LOW (ref >60)
Glucose, Bld: 251 mg/dL — ABNORMAL HIGH (ref 70–99)
Potassium: 5.1 mmol/L (ref 3.5–5.1)
Sodium: 133 mmol/L — ABNORMAL LOW (ref 135–145)

## 2024-05-18 LAB — PREALBUMIN: Prealbumin: 15 mg/dL — ABNORMAL LOW (ref 18–38)

## 2024-05-18 LAB — BLOOD GAS, VENOUS
Acid-base deficit: 5 mmol/L — ABNORMAL HIGH (ref 0.0–2.0)
Bicarbonate: 21.6 mmol/L (ref 20.0–28.0)
O2 Saturation: 85.2 %
Patient temperature: 36.8
pCO2, Ven: 45 mmHg (ref 44–60)
pH, Ven: 7.29 (ref 7.25–7.43)
pO2, Ven: 50 mmHg — ABNORMAL HIGH (ref 32–45)

## 2024-05-18 LAB — HEPARIN LEVEL (UNFRACTIONATED)
Heparin Unfractionated: 0.34 [IU]/mL (ref 0.30–0.70)
Heparin Unfractionated: 0.22 [IU]/mL — ABNORMAL LOW (ref 0.30–0.70)
Heparin Unfractionated: 0.22 [IU]/mL — ABNORMAL LOW (ref 0.30–0.70)

## 2024-05-18 LAB — FOLATE: Folate: 11.5 ng/mL (ref >5.9)

## 2024-05-18 LAB — CBC
HCT: 28 % — ABNORMAL LOW (ref 36.0–46.0)
Hemoglobin: 8.6 g/dL — ABNORMAL LOW (ref 12.0–15.0)
MCH: 30.5 pg (ref 26.0–34.0)
MCHC: 30.7 g/dL (ref 30.0–36.0)
MCV: 99.3 fL (ref 80.0–100.0)
Platelets: 182 K/uL (ref 150–400)
RBC: 2.82 MIL/uL — ABNORMAL LOW (ref 3.87–5.11)
RDW: 12.7 % (ref 11.5–15.5)
WBC: 11.5 K/uL — ABNORMAL HIGH (ref 4.0–10.5)
nRBC: 0 % (ref 0.0–0.2)

## 2024-05-18 LAB — HIV ANTIBODY (ROUTINE TESTING W REFLEX): HIV Screen 4th Generation wRfx: NONREACTIVE

## 2024-05-18 LAB — TROPONIN I (HIGH SENSITIVITY): Troponin I (High Sensitivity): 891 ng/L (ref <18)

## 2024-05-18 LAB — CBG MONITORING, ED
Glucose-Capillary: 132 mg/dL — ABNORMAL HIGH (ref 70–99)
Glucose-Capillary: 189 mg/dL — ABNORMAL HIGH (ref 70–99)

## 2024-05-18 LAB — PHOSPHORUS: Phosphorus: 3.4 mg/dL (ref 2.5–4.6)

## 2024-05-18 LAB — PROCALCITONIN: Procalcitonin: 0.1 ng/mL

## 2024-05-18 LAB — VITAMIN B12: Vitamin B-12: 323 pg/mL (ref 180–914)

## 2024-05-18 MED ORDER — PANTOPRAZOLE SODIUM 40 MG PO TBEC
40.0000 mg | DELAYED_RELEASE_TABLET | Freq: Every day | ORAL | Status: DC
  Administered 2024-05-18 – 2024-05-23 (×6): 40 mg via ORAL
  Filled 2024-05-18 (×6): qty 1

## 2024-05-18 MED ORDER — ASPIRIN 81 MG PO TBEC
81.0000 mg | DELAYED_RELEASE_TABLET | Freq: Every day | ORAL | Status: DC
  Administered 2024-05-18 – 2024-05-23 (×6): 81 mg via ORAL
  Filled 2024-05-18 (×6): qty 1

## 2024-05-18 MED ORDER — FENTANYL CITRATE (PF) 50 MCG/ML IJ SOSY: 12.5000 ug | PREFILLED_SYRINGE | INTRAMUSCULAR | Status: DC | PRN

## 2024-05-18 MED ORDER — HYDRALAZINE HCL 25 MG PO TABS
25.0000 mg | ORAL_TABLET | Freq: Three times a day (TID) | ORAL | Status: DC
  Administered 2024-05-18 – 2024-05-19 (×3): 25 mg via ORAL
  Filled 2024-05-18 (×3): qty 1

## 2024-05-18 MED ORDER — ALBUTEROL SULFATE (2.5 MG/3ML) 0.083% IN NEBU
2.5000 mg | INHALATION_SOLUTION | Freq: Four times a day (QID) | RESPIRATORY_TRACT | Status: DC | PRN
  Administered 2024-05-20: 2.5 mg via RESPIRATORY_TRACT
  Filled 2024-05-18: qty 3

## 2024-05-18 MED ORDER — SODIUM CHLORIDE 0.9 % IV SOLN
1.0000 g | INTRAVENOUS | Status: AC
  Administered 2024-05-18 – 2024-05-19 (×2): 1 g via INTRAVENOUS
  Filled 2024-05-18 (×2): qty 10

## 2024-05-18 MED ORDER — LABETALOL HCL 5 MG/ML IV SOLN
20.0000 mg | INTRAVENOUS | Status: DC | PRN
  Administered 2024-05-18 – 2024-05-19 (×2): 20 mg via INTRAVENOUS
  Filled 2024-05-18 (×2): qty 4

## 2024-05-18 MED ORDER — CARVEDILOL 25 MG PO TABS
25.0000 mg | ORAL_TABLET | Freq: Two times a day (BID) | ORAL | Status: DC
  Administered 2024-05-18 – 2024-05-23 (×11): 25 mg via ORAL
  Filled 2024-05-18 (×11): qty 1

## 2024-05-18 MED ORDER — TECHNETIUM TO 99M ALBUMIN AGGREGATED: 4.4000 | Freq: Once | INTRAVENOUS | Status: DC

## 2024-05-18 MED ORDER — METHYLPREDNISOLONE SODIUM SUCC 125 MG IJ SOLR
80.0000 mg | INTRAMUSCULAR | Status: DC
  Administered 2024-05-18: 80 mg via INTRAVENOUS
  Filled 2024-05-18: qty 2

## 2024-05-18 MED ORDER — ACETAMINOPHEN 325 MG PO TABS
650.0000 mg | ORAL_TABLET | Freq: Four times a day (QID) | ORAL | Status: DC | PRN
  Administered 2024-05-20 – 2024-05-22 (×6): 650 mg via ORAL
  Filled 2024-05-18 (×6): qty 2

## 2024-05-18 MED ORDER — HYDRALAZINE HCL 20 MG/ML IJ SOLN
10.0000 mg | Freq: Four times a day (QID) | INTRAMUSCULAR | Status: DC | PRN
  Administered 2024-05-19: 10 mg via INTRAVENOUS
  Filled 2024-05-18: qty 1

## 2024-05-18 MED ORDER — ALPRAZOLAM 0.5 MG PO TABS
0.5000 mg | ORAL_TABLET | Freq: Three times a day (TID) | ORAL | Status: DC | PRN
  Administered 2024-05-18 – 2024-05-21 (×5): 0.5 mg via ORAL
  Filled 2024-05-18 (×5): qty 1

## 2024-05-18 MED ORDER — ACETAMINOPHEN 650 MG RE SUPP: 650.0000 mg | Freq: Four times a day (QID) | RECTAL | Status: DC | PRN

## 2024-05-18 MED ORDER — MELATONIN 5 MG PO TABS
5.0000 mg | ORAL_TABLET | Freq: Every day | ORAL | Status: DC
  Administered 2024-05-18 – 2024-05-22 (×6): 5 mg via ORAL
  Filled 2024-05-18 (×6): qty 1

## 2024-05-18 MED ORDER — CLONIDINE HCL 0.2 MG/24HR TD PTWK
0.2000 mg | MEDICATED_PATCH | TRANSDERMAL | Status: DC
  Administered 2024-05-18: 0.2 mg via TRANSDERMAL
  Filled 2024-05-18 (×2): qty 1

## 2024-05-18 MED ORDER — ONDANSETRON HCL 4 MG PO TABS
4.0000 mg | ORAL_TABLET | Freq: Four times a day (QID) | ORAL | Status: DC | PRN
  Administered 2024-05-23: 4 mg via ORAL
  Filled 2024-05-18: qty 1

## 2024-05-18 MED ORDER — EZETIMIBE 10 MG PO TABS
10.0000 mg | ORAL_TABLET | Freq: Every day | ORAL | Status: DC
  Administered 2024-05-18 – 2024-05-23 (×6): 10 mg via ORAL
  Filled 2024-05-18 (×6): qty 1

## 2024-05-18 MED ORDER — ONDANSETRON HCL 4 MG/2ML IJ SOLN
4.0000 mg | Freq: Four times a day (QID) | INTRAMUSCULAR | Status: DC | PRN
  Administered 2024-05-19 – 2024-05-21 (×3): 4 mg via INTRAVENOUS
  Filled 2024-05-18 (×3): qty 2

## 2024-05-18 MED ORDER — FUROSEMIDE 10 MG/ML IJ SOLN
60.0000 mg | Freq: Once | INTRAMUSCULAR | Status: AC
  Administered 2024-05-18: 60 mg via INTRAVENOUS
  Filled 2024-05-18: qty 6

## 2024-05-18 MED ORDER — ALBUTEROL SULFATE (2.5 MG/3ML) 0.083% IN NEBU
2.5000 mg | INHALATION_SOLUTION | RESPIRATORY_TRACT | Status: DC | PRN
  Filled 2024-05-18: qty 3

## 2024-05-18 MED ORDER — HYDROCODONE-ACETAMINOPHEN 5-325 MG PO TABS
0.5000 | ORAL_TABLET | Freq: Four times a day (QID) | ORAL | Status: DC | PRN
Start: 2024-05-18 — End: 2024-05-23
  Administered 2024-05-19 – 2024-05-21 (×2): 0.5 via ORAL
  Filled 2024-05-18 (×2): qty 1

## 2024-05-18 NOTE — Assessment & Plan Note (Signed)
- -  Patient presenting with   cough, fever    hypoxia  and infiltrate   on chest x-ray -Infiltrate on CXR and 2-3 characteristics (fever, leukocytosis, purulent sputum) are consistent with pneumonia. -This appears to be most likely community-acquired pneumonia.         will admit for treatment of CAP will start on appropriate antibiotic coverage.  Cefepime vanc for now may need to deescalate in AM   Obtain:  sputum cultures,                   respiratory panel negative                   influenza serologies negative                  COVID PCR negative   but unlikely                  blood cultures and sputum cultures ordered                   strep pneumo UA antigen,                 check for Legionella antigen.                Provide oxygen as needed.

## 2024-05-18 NOTE — Progress Notes (Signed)
 PROGRESS NOTE  Shelly [name]  DOB: 27-Jul-1955  PCP: Loreli Kins, MD FMW:[id number]  DOA: 05/17/2024  LOS: 1 day  Hospital Day: 2  Subjective: Patient was seen and examined this morning. Pleasant elderly Caucasian female.  Sitting up in bed.  On low-flow oxygen. Not in distress. History reviewed and detailed as above Chart reviewed. No fever, heart rate is in 80s and 90s, Blood pressure overnight between 170s to 200s, on 2 L oxygen by nasal cannula Most recent blood work this morning with WC count 11.5   Brief narrative: Shelly [name] is a 68 y.o. female with PMH significant for DM2, malignant HTN, HLD, obesity, NICM, CHF,  CKD, gastric adenoma, chronic anemia, GERD, IBS h/o treated tuberculosis Lives at home, independent at baseline.  Works out every day. She has history of difficult to control hypertension and is on multiple medicines, reports compliance.  11/5, patient presented to the ED with complaint of progressive chest discomfort, shortness of breath, orthopnea, increased abdominal girth over the last few days.  Thinking this is related to her reflux, she went to GI.  Was noted to have O2 sat low at 83% and hence sent to ED.  In the ED, patient had temperature 100.4, blood pressure 170s, required 2 L oxygen to maintain saturation over 90% Initial blood gas with pH 7.29, pCO2 45 Labs with WBC count 15, hemoglobin 9, BNP 740, sodium 131, BUN/creatinine 47/2.62, lactic acid level normal Troponin trended up to peak at 19 Blood culture was sent Chest x-ray with mild vascular congestion, mild new small bilateral pleural effusion Urinalysis showed clear yellow urine with trace leukocytes, positive nitrate, rare bacteria Respiratory virus panel unremarkable  In the ED, patient was seen by cardiology, started on heparin  drip. Because of fever, patient was also started on broad-spectrum antibiotics Admitted to [facility]  Assessment and plan: Acute exacerbation of chronic  diastolic CHF NICM Malignant hypertension With volume retention symptoms in the setting of difficult to control hypertension Blood pressure, BNP elevated Recent echo with normal EF, mild LVH, suspect hypertensive heart disease Seen by cardiology Diuretics on hold because of AKI PTA meds- Coreg  25 mg twice daily, clonidine  0.2 mg weekly patch, Cardura  4 mg twice daily, hydralazine  25 mg 3 times daily. Currently continued on Coreg , clonidine . Defer to cardiology for GDMT and diuretics.  Noted plan to give IV Lasix  60 mg oral dose today. Continue to monitor for daily intake output, weight, blood pressure, BNP, renal function and electrolytes. Net IO Since Admission: 1,354.88 mL [05/18/24 1138] Recent Labs  Lab 05/17/24 1408 05/17/24 1413 05/17/24 1610 05/17/24 2044 05/17/24 2330 05/18/24 1020  BNP  --  740.5*  --   --   --   --   LATICACIDVEN  --   --  0.6 0.9 1.1  --   BUN 47*  --   --  36*  --  37*  CREATININE 2.62*  --   --  2.01*  --  2.37*  NA 131*  --   --  138  --  133*  K 5.1  --   --  3.6  --  5.1  MG  --   --   --  1.2*  --   --    NSTEMI HLD Past cardiac catheterization in 2015 with no significant CAD Mentioned chest discomfort.  Troponin peaked at 910. On heparin  drip.  May need cardiac cath PTA meds-Zetia.  Was not on antiplatelet/anticoagulant Continue Zetia.  Cardio added aspirin  81 mg  daily Recent Labs    05/17/24 1408 05/17/24 1556 05/17/24 2044 05/17/24 2330  CKTOTAL  --   --  39  --   TROPONINIHS 251* 489* 910* 891*   AKI on CKD 4 Acute metabolic acidosis Baseline creatinine less than 2.2 from August.  Presented with creatinine of 2.62.  Improving.  Continue to monitor. Serum bicarb level is somewhat worse.  Continue to monitor Recent Labs    03/06/24 0652 03/06/24 0842 05/17/24 1408 05/17/24 2044 05/18/24 1020  BUN 60* 60* 47* 36* 37*  CREATININE 2.19* 2.12* 2.62* 2.01* 2.37*  CO2 24 22 18* 17* 17*   UTI POA Urinalysis showed clear yellow  urine with trace leukocytes, positive nitrate, rare bacteria CT abdomen with moderate nonspecific perinephric stranding, no hydronephrosis. Send urine culture Initially had low-grade temperature 100.4, not tachycardic, lactic acid level not elevated. I do not think patient meets criteria for sepsis.  Sepsis ruled out Currently on broad-spectrum antibiotics with IV cefepime, vancomycin and Flagyl . I would narrow down the antibiotics to IV Rocephin  only.  Acute respiratory failure with hypoxia  pneumonia ruled out There is mention of pneumonia on administered x-ray but I think her respiratory symptoms are due to volume overload.  Procalcitonin not elevated.  I do not think patient has pneumonia.   Patient is on IV Solu-Medrol  80 mg daily.  I do not think she has bronchospasm.  Wheezing is probably due to volume.  I would stop IV steroids to avoid additional volume retention. Continue bronchodilators. Still requiring supplemental oxygen this morning.  Need to rule out pulm embolism.  Unable to do CT angio because of elevated creatinine.  VQ scan ordered. Recent Labs  Lab 05/17/24 1408 05/17/24 1610 05/17/24 2044 05/17/24 2330 05/18/24 0209  WBC 15.0*  --   --   --  11.5*  LATICACIDVEN  --  0.6 0.9 1.1  --   PROCALCITON  --   --  0.10  --   --     Hypomagnesemia Replacement was given Recent Labs  Lab 05/17/24 1408 05/17/24 2044 05/18/24 0209 05/18/24 1020  K 5.1 3.6  --  5.1  MG  --  1.2*  --   --   PHOS  --  2.5 3.4  --    Acute on chronic anemia GERD Baseline hemoglobin 10.8 from August.  Hemoglobin down to 8.6 this morning.  Continue to monitor Continue PPI Recent Labs    03/06/24 0652 05/17/24 1408 05/17/24 2040 05/17/24 2044 05/18/24 0209  HGB 10.8* 9.0*  --   --  8.6*  MCV 94.8 95.9  --   --  99.3  VITAMINB12  --   --   --   --  323  FOLATE  --   --   --   --  11.5  FERRITIN  --   --   --  52  --   TIBC  --   --   --  206*  --   IRON   --   --   --  12*  --    RETICCTPCT  --   --  2.1  --   --    Type 2 diabetes mellitus Hyperglycemia due to steroids A1c 6.9 on 05/17/2024 PTA meds-none Blood sugar level elevated to 280s this morning.  Probably due to steroids.  I have stopped steroids now. Continue SSI/Accu-Cheks Recent Labs  Lab 05/18/24 0016 05/18/24 0413 05/18/24 0838 05/18/24 1115  GLUCAP 189* 132* 160* 281*   Anxiety GERD  Xanax  as needed    Mobility: Independent at baseline  Goals of care   Code Status: Full Code     DVT prophylaxis: heparin  drip    Antimicrobials: IV Rocephin  Fluid: None Consultants: Cardiology Family Communication: None at bedside  Status: Inpatient Level of care:  Progressive   Patient is from: Home Needs to continue in-hospital care: Ongoing workup for shortness of breath    Diet:  Diet Order             Diet heart healthy/carb modified Room service appropriate? Yes; Fluid consistency: Thin  Diet effective now                   Scheduled Meds:  aspirin  EC  81 mg Oral Daily   carvedilol   25 mg Oral BID WC   cloNIDine   0.2 mg Transdermal Weekly   ezetimibe  10 mg Oral Daily   furosemide   60 mg Intravenous Once   hydrALAZINE   25 mg Oral Q8H   insulin  aspart  0-9 Units Subcutaneous Q4H   melatonin  5 mg Oral QHS   pantoprazole   40 mg Oral Daily    PRN meds: acetaminophen  **OR** acetaminophen , albuterol , ALPRAZolam , fentaNYL  (SUBLIMAZE ) injection, HYDROcodone -acetaminophen , nitroGLYCERIN , ondansetron  **OR** ondansetron  (ZOFRAN ) IV   Infusions:   cefTRIAXone  (ROCEPHIN )  IV     heparin  1,250 Units/hr (05/18/24 1034)   lactated ringers  Stopped (05/18/24 0100)    Antimicrobials: Anti-infectives (From admission, onward)    Start     Dose/Rate Route Frequency Ordered Stop   05/19/24 1200  vancomycin (VANCOREADY) IVPB 500 mg/100 mL  Status:  Discontinued        500 mg 100 mL/hr over 60 Minutes Intravenous Every 48 hours 05/17/24 2115 05/17/24 2118   05/19/24 1200  vancomycin  (VANCOREADY) IVPB 750 mg/150 mL  Status:  Discontinued        750 mg 150 mL/hr over 60 Minutes Intravenous Every 48 hours 05/17/24 2118 05/18/24 0854   05/18/24 1600  ceFEPIme (MAXIPIME) 2 g in sodium chloride  0.9 % 100 mL IVPB  Status:  Discontinued        2 g 200 mL/hr over 30 Minutes Intravenous Every 24 hours 05/17/24 2115 05/18/24 0854   05/18/24 1000  cefTRIAXone  (ROCEPHIN ) 1 g in sodium chloride  0.9 % 100 mL IVPB        1 g 200 mL/hr over 30 Minutes Intravenous Every 24 hours 05/18/24 0854     05/18/24 0400  metroNIDAZOLE  (FLAGYL ) IVPB 500 mg  Status:  Discontinued        500 mg 100 mL/hr over 60 Minutes Intravenous Every 12 hours 05/17/24 2047 05/18/24 0854   05/17/24 2100  metroNIDAZOLE  (FLAGYL ) IVPB 500 mg  Status:  Discontinued        500 mg 100 mL/hr over 60 Minutes Intravenous Every 12 hours 05/17/24 2047 05/17/24 2047   05/17/24 1615  ceFEPIme (MAXIPIME) 2 g in sodium chloride  0.9 % 100 mL IVPB        2 g 200 mL/hr over 30 Minutes Intravenous  Once 05/17/24 1605 05/17/24 1649   05/17/24 1615  metroNIDAZOLE  (FLAGYL ) IVPB 500 mg        500 mg 100 mL/hr over 60 Minutes Intravenous  Once 05/17/24 1605 05/17/24 1727   05/17/24 1615  vancomycin (VANCOCIN) IVPB 1000 mg/200 mL premix        1,000 mg 200 mL/hr over 60 Minutes Intravenous  Once 05/17/24 1605 05/17/24 1726       Objective:  Vitals:   05/18/24 0742 05/18/24 1100  BP: (!) 187/83 (!) 186/72  Pulse:    Resp:    Temp: 98.5 F (36.9 C) 98.3 F (36.8 C)  SpO2:      Intake/Output Summary (Last 24 hours) at 05/18/2024 1138 Last data filed at 05/17/2024 1727 Gross per 24 hour  Intake 1354.88 ml  Output --  Net 1354.88 ml   Filed Weights   05/17/24 1404  Weight: 85.3 kg   Weight change:  Body mass index is 34.39 kg/m.   Physical Exam: General exam: Pleasant, elderly Caucasian female. Skin: No rashes, lesions or ulcers. HEENT: Atraumatic, normocephalic, no obvious bleeding Lungs: mild crackles in the  bases otherwise clear to auscultation bilaterally CVS: S1, S2, no murmur,   GI/Abd: Soft, nontender, nondistended, bowel sound present,   CNS: Alert, awake, oriented x 3 Psychiatry: Mood appropriate Extremities: No pedal edema, no calf tenderness,   Data Review: I have personally reviewed the laboratory data and studies available.  F/u labs ordered Unresulted Labs (From admission, onward)     Start     Ordered   05/19/24 0500  Heparin  level (unfractionated)  Daily,   R      05/18/24 0720   05/19/24 0500  CBC  Daily,   R      05/18/24 0824   05/18/24 1700  Heparin  level (unfractionated)  Once-Timed,   TIMED        05/18/24 1023   05/18/24 0851  Urine Culture (for pregnant, neutropenic or urologic patients or patients with an indwelling urinary catheter)  (Urine Labs)  Add-on,   AD       Question:  Indication  Answer:  Sepsis   05/18/24 0850   05/18/24 0016  Legionella Pneumophila Serogp 1 Ur Ag  Once,   R        05/18/24 0015   05/18/24 0016  Strep pneumoniae urinary antigen  Once,   R        05/18/24 0015   05/18/24 0016  MRSA Next Gen by PCR, Nasal  Once,   R        05/18/24 0015   05/18/24 0015  Expectorated Sputum Assessment w Gram Stain, Rflx to Resp Cult  Once,   R       Question Answer Comment  Patient immune status Immunocompromised   Release to patient Immediate      05/18/24 0015   05/17/24 2044  T3  Add-on,   AD        05/17/24 2043   05/17/24 1605  Blood culture (routine x 2)  BLOOD CULTURE X 2,   R      05/17/24 1605   Signed and Held  Magnesium   Tomorrow morning,   R        Signed and Held   Signed and Held  Comprehensive metabolic panel  Tomorrow morning,   R       Question:  Release to patient  Answer:  Immediate   Signed and Held            Signed, Chapman Rota, MD [facility] 05/18/2024

## 2024-05-18 NOTE — Progress Notes (Signed)
 PHARMACY - ANTICOAGULATION CONSULT NOTE  Pharmacy Consult for Heparin  Indication: chest pain/ACS  Allergies  Allergen Reactions   Singulair  [Montelukast  Sodium] Other (See Comments)    Vivid dreams   Amlodipine  Besylate     Other reaction(s): fatgiue and nausea   Atorvastatin     Other reaction(s): strange feelings   Lisinopril Cough   Metformin Diarrhea   Penicillins Other (See Comments)    Other reaction(s): rash Unknown reaction.    Sulfa Antibiotics Nausea And Vomiting   Welchol  [Colesevelam Hcl]     Other reaction(s): constipation   Dilaudid  [Hydromorphone  Hcl] Anxiety and Other (See Comments)    paranoia   Sulfamethoxazole-Trimethoprim Rash    Patient Measurements: Height: 5' 2 (157.5 cm) Weight: 85.3 kg (188 lb) IBW/kg (Calculated) : 50.1 HEPARIN  DW (KG): 69.4  Vital Signs: Temp: 98.3 F (36.8 C) (11/06 1937) Temp Source: Oral (11/06 1937) BP: 210/77 (11/06 1937)  Labs: Recent Labs    05/17/24 1408 05/17/24 1556 05/17/24 2044 05/17/24 2330 05/18/24 0209 05/18/24 0751 05/18/24 1020 05/18/24 2007  HGB 9.0*  --   --   --  8.6*  --   --   --   HCT 28.0*  --   --   --  28.0*  --   --   --   PLT 240  --   --   --  182  --   --   --   APTT  --   --  169*  --   --   --   --   --   LABPROT  --   --  19.7*  --   --   --   --   --   INR  --   --  1.6*  --   --   --   --   --   HEPARINUNFRC  --   --   --   --  0.34 0.22*  --  0.22*  CREATININE 2.62*  --  2.01*  --   --   --  2.37*  --   CKTOTAL  --   --  39  --   --   --   --   --   TROPONINIHS 251* 489* 910* 891*  --   --   --   --     Estimated Creatinine Clearance: 23 mL/min (A) (by C-G formula based on SCr of 2.37 mg/dL (H)).   Medical History: Past Medical History:  Diagnosis Date   Acid reflux    Anemia    Anxiety    Arthritis    Asthma    infrequent problems   Cardiomyopathy (HCC)    CHF (congestive heart failure) (HCC)    Chronic UTI (urinary tract infection)    Complication of  anesthesia    I quit breathing during surgery 2008 - has had surgery since with no problems    Diabetes mellitus    Endometriosis    hx of    Headache    hx of migraines   Hepatitis B    hx of dx during ruptured appendix hospitalization   Hyperlipemia    Hypertension    IBS (irritable bowel syndrome)    Irritable bowel syndrome    Kidney disease    Obesity    Pneumonia 12/2013   Pneumonia    PONV (postoperative nausea and vomiting)    Rash    sides of neck   Stress incontinence    Tuberculosis  1985    Medications:  Infusions:   cefTRIAXone  (ROCEPHIN )  IV 1 g (05/18/24 1216)   heparin  1,250 Units/hr (05/18/24 1351)    Assessment: Pt is a 68 year old admitted for ACS. Not on anticoagulation PTA.   Hgb 9.0. Per chart, she has a history of chronic anemia. PLT stable. No bleeding noted.   Heparin  level subtherapeutic again at 0.22 on 1250 units/hr. Per RN, multiple pauses throughout the day due to patient pulling out IV. Now has new site established. No bleeding reported.   Goal of Therapy:  Heparin  level 0.3-0.7 units/ml Monitor platelets by anticoagulation protocol: Yes   Plan:  Increase heparin  slightly to 1350 units/hr.  Repeat heparin  level in 8 hours with AM labs. Daily CBC   Rocky Slade, PharmD, BCPS Clinical Pharmacist Clinical phone for 05/18/2024 from 7:30-3:00 is [phone].  **Pharmacist phone directory can be found on [email or web address] listed under [facility].  05/18/2024 9:03 PM

## 2024-05-18 NOTE — Assessment & Plan Note (Signed)
-  chronic avoid nephrotoxic medications such as NSAIDs, Vanco Zosyn combo,  avoid hypotension, continue to follow renal function

## 2024-05-18 NOTE — H&P (Incomplete)
 Shelly [name]:[id number] DOB: Nov 21, 1955 DOA: 05/17/2024     PCP: Loreli Kins, MD   Outpatient Specialists:   CARDS:   Dr. Annabella Scarce, MD   Patient arrived to ER on 05/17/24 at 1352 Referred by Attending Rogelia Jerilynn RAMAN, MD   Patient coming from:    home Lives alone,     Chief Complaint:   Chief Complaint  Patient presents with  . Shortness of Breath  . Chest Pain  . Gastroesophageal Reflux  . Anxiety    HPI: Shelly [name] is a 68 y.o. female with medical history significant of CHF, HTN, NICM, HLD, aortic stenosis, CKD IV, gastric adenoma, DM2, anemia, melanoma excision, sp cholecystectomy, hx of Tb treated    Presented with  fever, increased abdominal girth, chest pain/epigastric pain  Since Saturday has been having dyspnea, CP radiating to her neck She thought it was GERD and went to her GI  Was found to be hypoxic mid 83% on RA Improved on 2 L  Febrile to 100.4 Initial trop 250 repeat 400  Continues to have chest pain Blood cultures ordered   Started on broad spectrum abx cefepime/ vanc and flagyl   Cardiology consulted Dr. Wonda  Agreed with Heparin  hold off on cardiac cath given AKI  Careful rehydration          Recently was treated for UTi with keflex and finished all antibiotics  Denies significant ETOH intake   Does not smoke      Regarding pertinent Chronic problems:    Hyperlipidemia -  on zetia Lipid Panel  No results found for: CHOL, TRIG, HDL, CHOLHDL, VLDL, LDLCALC, LDLDIRECT, LABVLDL   HTN on coreg , clonidine , cardura , hydralazine    chronic CHF diastolic/ - last echo  Recent Results (from the past 56199 hours)  ECHOCARDIOGRAM COMPLETE   Collection Time: 05/03/24  9:33 AM  Result Value   Area-P 1/2 2.73   S' Lateral 4.40   AV Area mean vel 1.58   AR max vel 1.66   AV Area VTI 1.61   Ao pk vel 2.72   AV Mean grad 17.0   AV Peak grad 29.5   Narrative      ECHOCARDIOGRAM REPORT       IMPRESSIONS    1. Left ventricular ejection fraction by 3D volume is 55 %. The left ventricle has normal function. The left ventricle has no regional wall motion abnormalities. The left ventricular internal cavity size was mildly dilated. There is mild concentric left  ventricular hypertrophy. Left ventricular diastolic parameters are consistent with Grade I diastolic dysfunction (impaired relaxation).  2. Right ventricular systolic function is normal. The right ventricular size is normal.  3. Left atrial size was severely dilated.  4. The mitral valve is normal in structure. Mild to moderate mitral valve regurgitation. No evidence of mitral stenosis.  5. The aortic valve is normal in structure. Aortic valve regurgitation is not visualized. Mild aortic valve stenosis. Aortic valve area, by VTI measures 1.61 cm. Aortic valve mean gradient measures 17.0 mmHg. Aortic valve Vmax measures 2.72 m/s.  6. The inferior vena cava is dilated in size with <50% respiratory variability, suggesting right atrial pressure of 15 mmHg.            DM 2 -  Lab Results  Component Value Date   HGBA1C 8.1 (H) 04/20/2015    diet controlled    obesity-   BMI Readings from Last 1 Encounters:  05/17/24 34.39 kg/m  Asthma -well   controlled on home inhalers/ nebs                       CKD stage Iv   baseline Cr2.2 Estimated Creatinine Clearance: 20.8 mL/min (A) (by C-G formula based on SCr of 2.62 mg/dL (H)).  Lab Results  Component Value Date   CREATININE 2.62 (H) 05/17/2024   CREATININE 2.12 (H) 03/06/2024   CREATININE 2.19 (H) 03/06/2024   Lab Results  Component Value Date   NA 131 (L) 05/17/2024   CL 103 05/17/2024   K 5.1 05/17/2024   CO2 18 (L) 05/17/2024   BUN 47 (H) 05/17/2024   CREATININE 2.62 (H) 05/17/2024   GFRNONAA 19 (L) 05/17/2024   CALCIUM  8.1 (L) 05/17/2024   ALBUMIN 2.9 (L) 05/17/2024   GLUCOSE 204 (H) 05/17/2024    Chronic anemia - baseline hg Hemoglobin &  Hematocrit  Recent Labs    03/06/24 0652 05/17/24 1408  HGB 10.8* 9.0*   Iron /TIBC/Ferritin/ %Sat    Component Value Date/Time   IRON  37 (L) 09/13/2011 0651   TIBC 242 (L) 09/13/2011 0651   FERRITIN 260 09/13/2011 0651   IRONPCTSAT 15 (L) 09/13/2011 0651     Cancer: melanoma    While in ER: Clinical Course as of 05/17/24 1921  Wed May 17, 2024  1738 cooper [LS]    Clinical Course User Index [LS] Rogelia Jerilynn RAMAN, MD       Lab Orders         Blood culture (routine x 2)         Resp panel by RT-PCR (RSV, Flu A&B, Covid) Anterior Nasal Swab         Basic metabolic panel         CBC         Hepatic function panel         Lipase, blood         Urinalysis, Routine w reflex microscopic -Urine, Clean Catch         Brain natriuretic peptide         Heparin  level (unfractionated)         I-Stat CG4 Lactic Acid        CXR - New small right pleural effusion and possible small amount left pleural fluid.  CTabd/pelvis - ordered    Following Medications were ordered in ER: Medications  lactated ringers  infusion ( Intravenous New Bag/Given 05/17/24 1737)  nitroGLYCERIN  (NITROSTAT ) SL tablet 0.4 mg (has no administration in time range)  heparin  ADULT infusion 100 units/mL (25000 units/250mL) (1,100 Units/hr Intravenous New Bag/Given 05/17/24 1811)  lactated ringers  bolus 1,000 mL (0 mLs Intravenous Stopped 05/17/24 1727)  ceFEPIme (MAXIPIME) 2 g in sodium chloride  0.9 % 100 mL IVPB (0 g Intravenous Stopped 05/17/24 1649)  metroNIDAZOLE  (FLAGYL ) IVPB 500 mg (0 mg Intravenous Stopped 05/17/24 1727)  vancomycin (VANCOCIN) IVPB 1000 mg/200 mL premix (0 mg Intravenous Stopped 05/17/24 1726)  aspirin  chewable tablet 324 mg (324 mg Oral Given 05/17/24 1620)  heparin  bolus via infusion 4,000 Units (4,000 Units Intravenous Bolus from Bag 05/17/24 1811)    _______________________________________________________ ER Provider Called:     Cardiology  Dr. Wonda They Recommend admit to  medicine    SEEN in ER     ED Triage Vitals  Encounter Vitals Group     BP 05/17/24 1356 (!) 178/80     Girls Systolic BP Percentile --      Girls Diastolic BP Percentile --  Boys Systolic BP Percentile --      Boys Diastolic BP Percentile --      Pulse Rate 05/17/24 1356 91     Resp 05/17/24 1356 18     Temp 05/17/24 1356 (!) 100.4 F (38 C)     Temp Source 05/17/24 1356 Oral     SpO2 05/17/24 1356 94 %     Weight 05/17/24 1404 188 lb (85.3 kg)     Height 05/17/24 1404 5' 2 (1.575 m)     Head Circumference --      Peak Flow --      Pain Score 05/17/24 1403 7     Pain Loc --      Pain Education --      Exclude from Growth Chart --   UFJK(75)@     _________________________________________ Significant initial  Findings: Abnormal Labs Reviewed  BASIC METABOLIC PANEL WITH GFR - Abnormal; Notable for the following components:      Result Value   Sodium 131 (*)    CO2 18 (*)    Glucose, Bld 204 (*)    BUN 47 (*)    Creatinine, Ser 2.62 (*)    Calcium  8.1 (*)    GFR, Estimated 19 (*)    All other components within normal limits  CBC - Abnormal; Notable for the following components:   WBC 15.0 (*)    RBC 2.92 (*)    Hemoglobin 9.0 (*)    HCT 28.0 (*)    All other components within normal limits  HEPATIC FUNCTION PANEL - Abnormal; Notable for the following components:   Albumin 2.9 (*)    AST 14 (*)    All other components within normal limits  URINALYSIS, ROUTINE W REFLEX MICROSCOPIC - Abnormal; Notable for the following components:   Hgb urine dipstick SMALL (*)    Protein, ur 100 (*)    Nitrite POSITIVE (*)    Leukocytes,Ua TRACE (*)    Bacteria, UA RARE (*)    All other components within normal limits  TROPONIN I (HIGH SENSITIVITY) - Abnormal; Notable for the following components:   Troponin I (High Sensitivity) 251 (*)    All other components within normal limits  TROPONIN I (HIGH SENSITIVITY) - Abnormal; Notable for the following components:   Troponin I  (High Sensitivity) 489 (*)    All other components within normal limits      _________________________ Troponin  ordered Cardiac Panel (last 3 results) Recent Labs    05/17/24 1408 05/17/24 1556 05/17/24 2044  CKTOTAL  --   --  39  TROPONINIHS 251* 489* 910*     ECG: Ordered Personally reviewed and interpreted by me showing: HR : *** Rhythm: *NSR, Sinus tachycardia * A.fib. W RVR, RBBB, LBBB, Paced Ischemic changes*nonspecific changes, no evidence of ischemic changes QTC*  BNP (last 3 results) Recent Labs    03/06/24 0755  BNP 167.9*      No results for input(s): DDIMER, FERRITIN, LDH, CRP in the last 72 hours.    ____________________ This patient meets SIRS Criteria and may be septic. SIRS = Systemic Inflammatory Response Syndrome  Order a lactic acid level if needed AND/OR Initiate the sepsis protocol with the attached order set OR Click Treating Associated Infection or Illness if the patient is being treated for an infection that is a known cause of these abnormalities     The recent clinical data is shown below. Vitals:   05/17/24 1745 05/17/24 1800 05/17/24 1815 05/17/24 1830  BP:   (!) 144/62   Pulse: 76 75 73 82  Resp: 18 17 16 18   Temp:      TempSrc:      SpO2: 97% 97% 97% 97%  Weight:      Height:            WBC     Component Value Date/Time   WBC 15.0 (H) 05/17/2024 1408   LYMPHSABS 2.3 01/06/2018 1220   MONOABS 0.8 01/06/2018 1220   EOSABS 0.3 01/06/2018 1220   BASOSABS 0.1 01/06/2018 1220        Lactic Acid, Venous    Component Value Date/Time   LATICACIDVEN 0.6 05/17/2024 1610     Lactic Acid, Venous    Component Value Date/Time   LATICACIDVEN 0.6 05/17/2024 1610    Procalcitonin *** Ordered      UA *** no evidence of UTI  ***Pending ***not ordered   Urine analysis:    Component Value Date/Time   COLORURINE YELLOW 05/17/2024 1759   APPEARANCEUR CLEAR 05/17/2024 1759   LABSPEC 1.011 05/17/2024 1759    PHURINE 5.0 05/17/2024 1759   GLUCOSEU NEGATIVE 05/17/2024 1759   HGBUR SMALL (A) 05/17/2024 1759   BILIRUBINUR NEGATIVE 05/17/2024 1759   KETONESUR NEGATIVE 05/17/2024 1759   PROTEINUR 100 (A) 05/17/2024 1759   UROBILINOGEN 0.2 04/20/2015 1418   NITRITE POSITIVE (A) 05/17/2024 1759   LEUKOCYTESUR TRACE (A) 05/17/2024 1759    Results for orders placed or performed in visit on 09/27/19  Novel Coronavirus, NAA ([facility])     Status: None   Collection Time: 09/27/19 12:19 PM   Specimen: Nasopharyngeal(NP) swabs in vial transport medium   NASOPHARYNGE  TESTING  Result Value Ref Range Status   SARS-CoV-2, NAA Not Detected Not Detected Final    Comment: This nucleic acid amplification test was developed and its performance characteristics determined by [employer]. Nucleic acid amplification tests include RT-PCR and TMA. This test has not been FDA cleared or approved. This test has been authorized by FDA under an Emergency Use Authorization (EUA). This test is only authorized for the duration of time the declaration that circumstances exist justifying the authorization of the emergency use of in vitro diagnostic tests for detection of SARS-CoV-2 virus and/or diagnosis of COVID-19 infection under section 564(b)(1) of the Act, 21 U.S.C. 639aaa-6(a) (1), unless the authorization is terminated or revoked sooner. When diagnostic testing is negative, the possibility of a false negative result should be considered in the context of a patient's recent exposures and the presence of clinical signs and symptoms consistent with COVID-19. An individual without symptoms of COVID-19 and who is not shedding SARS-CoV-2 virus wo uld expect to have a negative (not detected) result in this assay.     ABX started Antibiotics Given (last 72 hours)     Date/Time Action Medication Dose Rate   05/17/24 1614 New Bag/Given   vancomycin (VANCOCIN) IVPB 1000 mg/200 mL premix 1,000 mg 200 mL/hr    05/17/24 1615 New Bag/Given   ceFEPIme (MAXIPIME) 2 g in sodium chloride  0.9 % 100 mL IVPB 2 g 200 mL/hr   05/17/24 1617 New Bag/Given   metroNIDAZOLE  (FLAGYL ) IVPB 500 mg 500 mg 100 mL/hr        No results found for the last 90 days.    ________________________________________________________________  Arterial ***Venous  Blood Gas result:  pH *** pCO2 ***; pO2 ***;     %O2 Sat ***.  ABG    Component Value Date/Time   PHART 7.419  06/29/2014 0814   PCO2ART 41.7 06/29/2014 0814   PO2ART 66.0 (L) 06/29/2014 0814   HCO3 29.5 (H) 06/29/2014 0821   TCO2 21 04/20/2015 1345   O2SAT 62.0 06/29/2014 0821       __________________________________________________________ Recent Labs  Lab 05/17/24 1408  NA 131*  K 5.1  CO2 18*  GLUCOSE 204*  BUN 47*  CREATININE 2.62*  CALCIUM  8.1*    Cr  * stable,  Up from baseline see below Lab Results  Component Value Date   CREATININE 2.62 (H) 05/17/2024   CREATININE 2.12 (H) 03/06/2024   CREATININE 2.19 (H) 03/06/2024    Recent Labs  Lab 05/17/24 1556  AST 14*  ALT 11  ALKPHOS 51  BILITOT 0.9  PROT 6.7  ALBUMIN 2.9*   Lab Results  Component Value Date   CALCIUM  8.1 (L) 05/17/2024          Plt: Lab Results  Component Value Date   PLT 240 05/17/2024         Recent Labs  Lab 05/17/24 1408  WBC 15.0*  HGB 9.0*  HCT 28.0*  MCV 95.9  PLT 240    HG/HCT * stable,  Down *Up from baseline see below    Component Value Date/Time   HGB 9.0 (L) 05/17/2024 1408   HCT 28.0 (L) 05/17/2024 1408   MCV 95.9 05/17/2024 1408      Recent Labs  Lab 05/17/24 1556  LIPASE 18   No results for input(s): AMMONIA in the last 168 hours.    _______________________________________________ Hospitalist was called for admission for *** NSTEMI (non-ST elevated myocardial infarction) (HCC) ***    The following Work up has been ordered so far:  Orders Placed This Encounter  Procedures  . Blood culture (routine x 2)  .  Resp panel by RT-PCR (RSV, Flu A&B, Covid) Anterior Nasal Swab  . DG Chest 2 View  . Basic metabolic panel  . CBC  . Hepatic function panel  . Lipase, blood  . Urinalysis, Routine w reflex microscopic -Urine, Clean Catch  . Brain natriuretic peptide  . Heparin  level (unfractionated)  . Document Height and Actual Weight  . If O2 Sat <94% administer O2 at 2 liters/minute via nasal cannula  . DO NOT delay antibiotics if unable to obtain blood culture.  . Code Sepsis activation.  This occurs automatically when order is signed and prioritizes pharmacy, lab, and radiology services for STAT collections and interventions.  If [facility] downtime, call [facility] [phone]) to activate Code Sepsis.  SABRA monitoring by pharmacy  . heparin  per pharmacy consult  . Inpatient consult to Cardiology  . Consult to hospitalist  . I-Stat CG4 Lactic Acid  . EKG 12-Lead  . ED EKG  . ED EKG  . Insert 2nd peripheral IV if not already present.     OTHER Significant initial  Findings:  labs showing:     DM  labs:  HbA1C: No results for input(s): HGBA1C in the last 8760 hours.     CBG (last 3)  No results for input(s): GLUCAP in the last 72 hours.        Cultures:    Component Value Date/Time   SDES URINE, CLEAN CATCH 09/20/2016 1814   SPECREQUEST NONE 09/20/2016 1814   CULT (A) 09/20/2016 1814    <10,000 COLONIES/mL INSIGNIFICANT GROWTH Performed at [facility], [address], [place], [place] [address]    REPTSTATUS 09/22/2016 FINAL 09/20/2016 1814     Radiological Exams on Admission:  DG Chest 2 View Result Date: 05/17/2024 CLINICAL DATA:  Shortness of breath and chest pain. EXAM: CHEST - 2 VIEW COMPARISON:  03/06/2024 FINDINGS: Lungs are adequately inflated without focal lobar consolidation. Small amount right pleural fluid which is new and possible small amount left pleural fluid. Mild prominence of the central pulmonary vessels which may be due to mild degree of vascular  congestion. Cardiomediastinal silhouette and remainder of the exam is unchanged. IMPRESSION: 1. New small right pleural effusion and possible small amount left pleural fluid. 2. Suggestion of mild vascular congestion. Electronically Signed   By: Toribio Agreste M.D.   On: 05/17/2024 15:14   _______________________________________________________________________________________________________ Latest  Blood pressure (!) 144/62, pulse 82, temperature (!) 100.4 F (38 C), temperature source Oral, resp. rate 18, height 5' 2 (1.575 m), weight 85.3 kg, SpO2 97%.   Vitals  labs and radiology finding personally reviewed  Review of Systems:    Pertinent positives include: ***  Constitutional:  No weight loss, night sweats, Fevers, chills, fatigue, weight loss  HEENT:  No headaches, Difficulty swallowing,Tooth/dental problems,Sore throat,  No sneezing, itching, ear ache, nasal congestion, post nasal drip,  Cardio-vascular:  No chest pain, Orthopnea, PND, anasarca, dizziness, palpitations.no Bilateral lower extremity swelling  GI:  No heartburn, indigestion, abdominal pain, nausea, vomiting, diarrhea, change in bowel habits, loss of appetite, melena, blood in stool, hematemesis Resp:  no shortness of breath at rest. No dyspnea on exertion, No excess mucus, no productive cough, No non-productive cough, No coughing up of blood.No change in color of mucus.No wheezing. Skin:  no rash or lesions. No jaundice GU:  no dysuria, change in color of urine, no urgency or frequency. No straining to urinate.  No flank pain.  Musculoskeletal:  No joint pain or no joint swelling. No decreased range of motion. No back pain.  Psych:  No change in mood or affect. No depression or anxiety. No memory loss.  Neuro: no localizing neurological complaints, no tingling, no weakness, no double vision, no gait abnormality, no slurred speech, no confusion  All systems reviewed and apart from HOPI all are  negative _______________________________________________________________________________________________ Past Medical History:   Past Medical History:  Diagnosis Date  . Acid reflux   . Anemia   . Anxiety   . Arthritis   . Asthma    infrequent problems  . Cardiomyopathy (HCC)   . CHF (congestive heart failure) (HCC)   . Chronic UTI (urinary tract infection)   . Complication of anesthesia    I quit breathing during surgery 2008 - has had surgery since with no problems   . Diabetes mellitus   . Endometriosis    hx of   . Headache    hx of migraines  . Hepatitis B    hx of dx during ruptured appendix hospitalization  . Hyperlipemia   . Hypertension   . IBS (irritable bowel syndrome)   . Irritable bowel syndrome   . Kidney disease   . Obesity   . Pneumonia 12/2013  . Pneumonia   . PONV (postoperative nausea and vomiting)   . Rash    sides of neck  . Stress incontinence   . Tuberculosis 1985      Past Surgical History:  Procedure Laterality Date  . ABDOMINAL ADHESION SURGERY  1989   x 7  . ABDOMINAL HYSTERECTOMY  01/1996  . APPENDECTOMY  1987  . BREAST BIOPSY Right 05/18/2022   MM RT BREAST BX W LOC DEV 1ST LESION IMAGE BX SPEC STEREO GUIDE  05/18/2022 [facility]  . CHOLECYSTECTOMY  11/24/2000  . COLON SURGERY  2010   colon resection due to adhesions  . LAPAROSCOPIC LYSIS OF ADHESIONS  07/1991   pelvic adhesions  . LEFT AND RIGHT HEART CATHETERIZATION WITH CORONARY ANGIOGRAM N/A 06/29/2014   Procedure: LEFT AND RIGHT HEART CATHETERIZATION WITH CORONARY ANGIOGRAM;  Surgeon: Candyce GORMAN Reek, MD;  Location: [facility] CATH LAB;  Service: Cardiovascular;  Laterality: N/A;  . OOPHORECTOMY Right 04/1995  . OOPHORECTOMY Left 12/1992   removed left ovary  . PARTIAL KNEE ARTHROPLASTY Left 10/27/2013   Procedure: LEFT KNEE UNICOMPARTMENTAL REPLACEMENT ;  Surgeon: Dempsey LULLA Moan, MD;  Location: [facility] ORS;  Service: Orthopedics;  Laterality: Left;  . PARTIAL KNEE  ARTHROPLASTY Right 05/21/2014   Procedure: MEDIAL UNICOMPARTMENTAL RIGHT KNEE ARTHROPLASTY;  Surgeon: Dempsey Moan LULLA, MD;  Location: [facility] OR[facility];  Service: Orthopedics;  Laterality: Right;  . TONSILLECTOMY      Social History:  Ambulatory *** independently cane, walker  wheelchair bound, bed bound     reports that she has never smoked. She has never used smokeless tobacco. She reports current alcohol use. She reports that she does not use drugs.     Family History: *** Family History  Problem Relation Age of Onset  . Hypertension Mother   . Heart failure Mother   . Stroke Mother   . Heart disease Father   . Diabetes Father   . Liver cancer Brother   . Heart attack Maternal Grandmother   . Hypertension Maternal Grandmother   . Heart attack Paternal Grandmother   . Breast cancer Neg Hx    ______________________________________________________________________________________________ Allergies: Allergies  Allergen Reactions  . Singulair  [Montelukast  Sodium] Other (See Comments)    Vivid dreams  . Amlodipine  Besylate     Other reaction(s): fatgiue and nausea  . Atorvastatin     Other reaction(s): strange feelings  . Lisinopril Cough  . Metformin Diarrhea  . Penicillins Other (See Comments)    Other reaction(s): rash Unknown reaction.   . Sulfa Antibiotics Nausea And Vomiting  . Welchol  [Colesevelam Hcl]     Other reaction(s): constipation  . Dilaudid  [Hydromorphone  Hcl] Anxiety and Other (See Comments)    paranoia  . Sulfamethoxazole-Trimethoprim Rash     Prior to Admission medications   Medication Sig Start Date End Date Taking? Authorizing Provider  albuterol  (PROVENTIL ) (2.5 MG/3ML) 0.083% nebulizer solution Take 3 mLs (2.5 mg total) by nebulization every 6 (six) hours as needed for wheezing or shortness of breath. 07/26/19   Desai, Nikita S, MD  ALPRAZolam  (XANAX ) 0.5 MG tablet Take 0.5 mg by mouth 3 (three) times daily as needed. Anxiety    [provider]   carvedilol  (COREG ) 25 MG tablet Take 25 mg by mouth 2 (two) times daily with a meal.    [provider]  cloNIDine  (CATAPRES  - DOSED IN MG/24 HR) 0.2 mg/24hr patch Place 0.2 mg onto the skin once a week.    [provider]  cloNIDine  (CATAPRES ) 0.1 MG tablet Take 1 tablet (0.1 mg total) by mouth 2 (two) times daily. 04/03/24   Raford Riggs, MD  COMBIVENT RESPIMAT 20-100 MCG/ACT AERS respimat Take 1 puff by mouth daily. 07/23/16   [provider]  doxazosin  (CARDURA ) 2 MG tablet Take 1 tablet (2 mg total) by mouth daily. 04/03/24   Raford Riggs, MD  ezetimibe (ZETIA) 10 MG tablet Take 10 mg by mouth daily.    [provider]  fluticasone  (FLONASE ) 50 MCG/ACT nasal spray  Place 1 spray into both nostrils daily as needed. For allergy nasal congestion 05/31/14   [provider]  hydrALAZINE  (APRESOLINE ) 25 MG tablet Take 25 mg by mouth 3 (three) times daily.    [provider]  HYDROcodone -acetaminophen  (NORCO/VICODIN) 5-325 MG tablet Take 1 tablet by mouth every 6 (six) hours as needed.    [provider]  promethazine  (PHENERGAN ) 25 MG tablet Take 1 tablet (25 mg total) by mouth daily as needed. 09/20/16   Mesner, Selinda, MD    ___________________________________________________________________________________________________ Physical Exam:    05/17/2024    6:30 PM 05/17/2024    6:15 PM 05/17/2024    6:00 PM  Vitals with BMI  Systolic  144   Diastolic  62   Pulse 82 73 75     1. General:  in No ***Acute distress***increased work of breathing ***complaining of severe pain****agitated * Chronically ill *well *cachectic *toxic acutely ill -appearing 2. Psychological: Alert and *** Oriented 3. Head/ENT:   Moist *** Dry Mucous Membranes                          Head Non traumatic, neck supple                          Normal *** Poor Dentition 4. SKIN: normal *** decreased Skin turgor,  Skin clean Dry and intact no rash     5. Heart: Regular rate and rhythm no*** Murmur, no Rub or gallop 6. Lungs: ***Clear to auscultation bilaterally, no wheezes or crackles   7. Abdomen: Soft, ***non-tender, Non distended *** obese ***bowel sounds present 8. Lower extremities: no clubbing, cyanosis, no ***edema 9. Neurologically Grossly intact, moving all 4 extremities equally *** strength 5 out of 5 in all 4 extremities cranial nerves II through XII intact 10. MSK: Normal range of motion    Chart has been reviewed  ______________________________________________________________________________________________  Assessment/Plan  ***  Admitted for *** NSTEMI (non-ST elevated myocardial infarction) (HCC) ***    Present on Admission: **None**     No problem-specific Assessment & Plan notes found for this encounter.    Other plan as per orders.  DVT prophylaxis:  SCD *** Lovenox        Code Status:    Code Status: Prior FULL CODE *** DNR/DNI ***comfort care as per patient ***family  I had personally discussed CODE STATUS with patient and family*  ACP *** none has been reviewed ***   Family Communication:   Family not at  Bedside  plan of care was discussed on the phone with *** Son, Daughter, Wife, Husband, Sister, Brother , father, mother  Diet    Disposition Plan:   *** likely will need placement for rehabilitation                          Back to current facility when stable                            To home once workup is complete and patient is stable  ***Following barriers for discharge:                             Chest pain *** Stroke *** Syncope ***work up is complete  Electrolytes corrected                               Anemia corrected h/H stable                             Pain controlled with PO medications                               Afebrile, white count improving able to transition to PO antibiotics                             Will need to be able to tolerate  PO                            Will likely need home health, home O2, set up                           Will need consultants to evaluate patient prior to discharge                           Work of breathing improves       Consult Orders  (From admission, onward)           Start     Ordered   05/17/24 1831  Consult to hospitalist  Once       Provider:  (Not yet assigned)  Question Answer Comment  Place call to: [facility]   Reason for Consult Admit      05/17/24 1830                              ***Would benefit from PT/OT eval prior to DC  Ordered                   Swallow eval - SLP ordered                   Diabetes care coordinator                   Transition of care consulted                   Nutrition    consulted                  Wound care  consulted                   Palliative care    consulted                   Behavioral health  consulted                    Consults called: ***  NONE   Admission status:  ED Disposition     ED Disposition  Admit   Condition  --   Comment  The patient appears reasonably stabilized for admission considering the current resources, flow, and capabilities available in the ED at this time, and I doubt any other [facility] requiring further screening and/or treatment in the ED prior to admission  is  present.           Obs***  ***  inpatient     I Expect 2 midnight stay secondary to severity of patient's current illness need for inpatient interventions justified by the following: ***hemodynamic instability despite optimal treatment (tachycardia *hypotension * tachypnea *hypoxia, hypercapnia) *** Severe lab/radiological/exam abnormalities including:    NSTEMI (non-ST elevated myocardial infarction) (HCC) ***  and extensive comorbidities including: *substance abuse  *Chronic pain *DM2  * CHF * CAD  * COPD/asthma *Morbid Obesity * CKD *dementia *liver disease *history of stroke with residual deficits *   malignancy, * sickle cell disease  History of amputation Chronic anticoagulation  That are currently affecting medical management.   I expect  patient to be hospitalized for 2 midnights requiring inpatient medical care.  Patient is at high risk for adverse outcome (such as loss of life or disability) if not treated.  Indication for inpatient stay as follows:  Severe change from baseline regarding mental status Hemodynamic instability despite maximal medical therapy,  severe pain requiring acute inpatient management,  inability to maintain oral hydration   persistent chest pain despite medical management Need for operative/procedural  intervention New or worsening hypoxia ongoing suicidal ideations   Need for IV antibiotics, IV fluids,,     Level of care       progressive    Yasir Kitner 05/17/2024, 7:21 PM ***  [facility]     after 2 AM please page floor coverage   If 7AM-7PM, please contact the day team taking care of the patient using [email or web address]

## 2024-05-18 NOTE — ED Notes (Signed)
 Pt ambulatory to restroom

## 2024-05-18 NOTE — Assessment & Plan Note (Signed)
 Pt has been recently treated for UTI  Reports symptoms have improved Currently broad spectrum abx should cover

## 2024-05-18 NOTE — Progress Notes (Signed)
 PHARMACY - ANTICOAGULATION CONSULT NOTE  Pharmacy Consult for Heparin  Indication: chest pain/ACS  Allergies  Allergen Reactions   Singulair  [Montelukast  Sodium] Other (See Comments)    Vivid dreams   Amlodipine  Besylate     Other reaction(s): fatgiue and nausea   Atorvastatin     Other reaction(s): strange feelings   Lisinopril Cough   Metformin Diarrhea   Penicillins Other (See Comments)    Other reaction(s): rash Unknown reaction.    Sulfa Antibiotics Nausea And Vomiting   Welchol  [Colesevelam Hcl]     Other reaction(s): constipation   Dilaudid  [Hydromorphone  Hcl] Anxiety and Other (See Comments)    paranoia   Sulfamethoxazole-Trimethoprim Rash    Patient Measurements: Height: 5' 2 (157.5 cm) Weight: 85.3 kg (188 lb) IBW/kg (Calculated) : 50.1 HEPARIN  DW (KG): 69.4  Vital Signs: Temp: 98.5 F (36.9 C) (11/06 0742) Temp Source: Oral (11/06 0742) BP: 187/83 (11/06 0742) Pulse Rate: 91 (11/06 0700)  Labs: Recent Labs    05/17/24 1408 05/17/24 1556 05/17/24 2044 05/17/24 2330 05/18/24 0209 05/18/24 0751  HGB 9.0*  --   --   --  8.6*  --   HCT 28.0*  --   --   --  28.0*  --   PLT 240  --   --   --  182  --   APTT  --   --  169*  --   --   --   LABPROT  --   --  19.7*  --   --   --   INR  --   --  1.6*  --   --   --   HEPARINUNFRC  --   --   --   --  0.34 0.22*  CREATININE 2.62*  --  2.01*  --   --   --   CKTOTAL  --   --  39  --   --   --   TROPONINIHS 251* 489* 910* 891*  --   --     Estimated Creatinine Clearance: 27.1 mL/min (A) (by C-G formula based on SCr of 2.01 mg/dL (H)).   Medical History: Past Medical History:  Diagnosis Date   Acid reflux    Anemia    Anxiety    Arthritis    Asthma    infrequent problems   Cardiomyopathy (HCC)    CHF (congestive heart failure) (HCC)    Chronic UTI (urinary tract infection)    Complication of anesthesia    I quit breathing during surgery 2008 - has had surgery since with no problems    Diabetes  mellitus    Endometriosis    hx of    Headache    hx of migraines   Hepatitis B    hx of dx during ruptured appendix hospitalization   Hyperlipemia    Hypertension    IBS (irritable bowel syndrome)    Irritable bowel syndrome    Kidney disease    Obesity    Pneumonia 12/2013   Pneumonia    PONV (postoperative nausea and vomiting)    Rash    sides of neck   Stress incontinence    Tuberculosis 1985    Medications:  Infusions:   cefTRIAXone  (ROCEPHIN )  IV     heparin  1,100 Units/hr (05/18/24 0525)   lactated ringers  Stopped (05/18/24 0100)    Assessment: Pt is a 68 year old admitted for ACS. Not on anticoagulation PTA.   Hgb 9.0. Per chart, she has a  history of chronic anemia. PLT stable. No bleeding noted.   Heparin  level subtherapeutic at 0.22 on 1100 units/hr.  Goal of Therapy:  Heparin  level 0.3-0.7 units/ml Monitor platelets by anticoagulation protocol: Yes   Plan:  Increase heparin  to 1250 units/hr.  Repeat heparin  level in 6 hours. Daily CBC   Macklin Jacquin, Pharm.D., BCPS Clinical Pharmacist Clinical phone for 05/18/2024 from 7:30-3:00 is [phone].  **Pharmacist phone directory can be found on [email or web address] listed under [facility].  05/18/2024 10:20 AM

## 2024-05-18 NOTE — Assessment & Plan Note (Signed)
 Ct abd pelvis did show  Moderate nonspecific perinephric stranding and small volume fluid, slightly increased compared to prior, correlate for upper UTI

## 2024-05-18 NOTE — Assessment & Plan Note (Addendum)
-  -   Will initiate: Steroid taper  -  Antibiotics  , - Albuterol  PRN, - scheduled duoneb,    -  Mucinex.  Titrate O2 to saturation >90%. Follow patients respiratory status.

## 2024-05-18 NOTE — Progress Notes (Addendum)
 Progress Note  Patient Name: Shelly [name] Date of Encounter: 05/18/2024 [facility] Cardiologist: Annabella Scarce, MD   Interval Summary    Continues to feel short of breath, on O2. No chest pain but fullness.   Vital Signs Vitals:   05/18/24 0530 05/18/24 0630 05/18/24 0700 05/18/24 0742  BP: (!) 170/74 (!) 175/68 (!) 181/68 (!) 187/83  Pulse: 89 92 91   Resp: 15 18 19    Temp:   98.5 F (36.9 C) 98.5 F (36.9 C)  TempSrc:   Oral Oral  SpO2: 95% 97% 96%   Weight:      Height:        Intake/Output Summary (Last 24 hours) at 05/18/2024 1105 Last data filed at 05/17/2024 1727 Gross per 24 hour  Intake 1354.88 ml  Output --  Net 1354.88 ml      05/17/2024    2:04 PM 04/03/2024    8:15 AM 03/30/2024    2:29 PM  Last 3 Weights  Weight (lbs) 188 lb 180 lb 185 lb  Weight (kg) 85.276 kg 81.647 kg 83.915 kg      Telemetry/ECG   Sinus Rhythm, tachy at times - Personally Reviewed  Physical Exam  GEN: No distress, [place] @2L   Neck: No JVD Cardiac: RRR, no murmurs, rubs, or gallops.  Respiratory: Diminished, crackles in bases GI: Soft, nontender, non-distended  MS: No edema  Assessment & Plan    68 y.o. female with a hx of NICM who was seen 05/17/2024 for the evaluation of NSTEMI at the request of Dr Rogelia.   NSTEMI -- Previous cardiac catheterization in 2015 with no CAD -- Denies any chest pain prior to admission but does report while ambulating on a recent trip she developed tightness in her throat.  Thought this was may be secondary to reflux symptoms. -- hsTn 251>>[id number], EKG showed sinus rhythm with possible age-indeterminate anterior infarct with ST and T wave abnormality though unchanged from her baseline -- UA also consistent with UTI, on antibiotics, therefore this could be some degree of demand ischemia.  She will need ischemic evaluation this admission though timing is dependent upon her renal function -- continue IV heparin , add ASA  Acute  on chronic diastolic CHF -- Recent outpatient echo 04/2024 with LVEF of 55%, grade 1 diastolic dysfunction, normal RV, mild to moderate MR -- Initial presentation patient was felt to potentially have pneumonia though suspect this is more heart failure related given pleural effusions.  Chest x-ray this morning with worsening pleural effusions and vascular congestion -- BNP 740, diminished breath sounds with crackles in bases on exam.  Remains on 2 L nasal cannula.  Complaining of orthopnea/PND -- Will give IV Lasix  60 mg x 1 now, follow response  AKI with baseline CKD stage IIIb -- Creatinine 2.6 on admission, >> 2.01 today -- follow with diuresis  HTN History of resistant hypertension -- BP poorly controlled, suspect volume overload may be contributing as well -- Continue carvedilol  25 mg twice daily, clonidine  0.2mg  patch, add hydralazine  25 mg 3 times daily. Adjust as needed -- Avoid ACE/ARB/spiro for now  UTI -- UA positive for leukocytes, nitrates and bacteria -- CTA with moderate nonspecific perinephric stranding -- On broad-spectrum antibiotics, narrow per primary  Hypomagnesemia -- Mag 1.2, supplement  Acute on chronic anemia -- Baseline hemoglobin 10.8 from 02/2024 -- Hemoglobin 8.6 this morning, denies any issues with bleeding prior to admission -- Follow with need for IV heparin , aspirin  -- Continue PPI   For  questions or updates, please contact [facility] Please consult [email or web address] for contact info under      Signed, Manuelita Rummer, NP   Patient seen, examined. Available data reviewed. Agree with findings, assessment, and plan as outlined by Manuelita Rummer, NP. Pt short of breath, no improvement reported. No other complaints today. No CP. On exam: Vitals:   05/18/24 0742 05/18/24 1100  BP: (!) 187/83 (!) 186/72  Pulse:    Resp:    Temp: 98.5 F (36.9 C) 98.3 F (36.8 C)  SpO2:     Pt is alert and oriented, NAD HEENT: normal Neck: JVP -  moderately elevated Lungs: diminished in the bases otherwise clear CV: RRR with 2/6 systolic murmur at the LLSB Abd: soft, NT, Positive BS, no hepatomegaly Ext: no C/C/E, distal pulses intact and equal Skin: warm/dry no rash  Appears to have acute/chronic diastolic HF in the setting of UTI and resistant HTN. Lasix  60 mg IV x 1 now. Follow creatinine trend. Continue IV heparin . Cardiac cath when creatinine stable and heart failure symptoms improved (pt needs to be able to lie flat for procedure with stable oxygenation). Otherwise, as outlined above.   [name], M.D. 05/18/2024 12:37 PM

## 2024-05-18 NOTE — Assessment & Plan Note (Addendum)
-  SIRS criteria met with   elevated white blood cell count,       Component Value Date/Time   WBC 15.0 (H) 05/17/2024 1408   LYMPHSABS 2.3 01/06/2018 1220    fever   RR >20 Today's Vitals   05/17/24 2000 05/17/24 2026 05/17/24 2030 05/17/24 2100  BP: (!) 155/52  (!) 155/78 (!) 149/56  Pulse: 70  74 71  Resp: 15  (!) 22 18  Temp:  98.2 F (36.8 C)    TempSrc:  Oral    SpO2: 98%  99% 99%  Weight:      Height:      PainSc:          -Most likely source being:    Source of sepsis is unknown but given clinical picture will continue to treat    Patient meeting criteria for Severe sepsis with    evidence of end organ damage/organ dysfunction such as   elevated troponin      - Obtain serial lactic acid and procalcitonin level.  - Initiated IV antibiotics in ER: Antibiotics Given (last 72 hours)     Date/Time Action Medication Dose Rate   05/17/24 1614 New Bag/Given   vancomycin (VANCOCIN) IVPB 1000 mg/200 mL premix 1,000 mg 200 mL/hr   05/17/24 1615 New Bag/Given   ceFEPIme (MAXIPIME) 2 g in sodium chloride  0.9 % 100 mL IVPB 2 g 200 mL/hr   05/17/24 1617 New Bag/Given   metroNIDAZOLE  (FLAGYL ) IVPB 500 mg 500 mg 100 mL/hr       Will continue  on : cefepime, vanc and flagyl    - await results of blood and urine culture  - Rehydrate  being mindful of hx of CHF Intravenous fluids were administered,      12:07 AM

## 2024-05-18 NOTE — ED Notes (Signed)
 Pt was c/o increased sob when this paramedic walked in. Pt found to have [place] on side of face. [facility] repositioned and O2 was back at 98% with decreased work of breathing and patient stating she felt better.

## 2024-05-18 NOTE — Assessment & Plan Note (Signed)
 Restart home medications xanax  prn

## 2024-05-18 NOTE — Progress Notes (Signed)
 PHARMACY - ANTICOAGULATION CONSULT NOTE  Pharmacy Consult for heparin  Indication: NSTEMI  Labs: Recent Labs    05/17/24 1408 05/17/24 1556 05/17/24 2044 05/17/24 2330 05/18/24 0209  HGB 9.0*  --   --   --  8.6*  HCT 28.0*  --   --   --  28.0*  PLT 240  --   --   --  182  APTT  --   --  169*  --   --   LABPROT  --   --  19.7*  --   --   INR  --   --  1.6*  --   --   HEPARINUNFRC  --   --   --   --  0.34  CREATININE 2.62*  --  2.01*  --   --   CKTOTAL  --   --  39  --   --   TROPONINIHS 251* 489* 910* 891*  --    Assessment/Plan:  68yo female therapeutic on heparin  with initial dosing for NSTEMI. Will continue infusion at current rate of 1100 units/hr and confirm stable with additional level.  Marvetta Dauphin, PharmD, BCPS 05/18/2024 3:23 AM

## 2024-05-19 ENCOUNTER — Encounter (HOSPITAL_COMMUNITY): Payer: Self-pay | Admitting: Cardiology

## 2024-05-19 ENCOUNTER — Other Ambulatory Visit: Payer: Self-pay | Admitting: Cardiology

## 2024-05-19 DIAGNOSIS — I214 Non-ST elevation (NSTEMI) myocardial infarction: Secondary | ICD-10-CM | POA: Diagnosis not present

## 2024-05-19 DIAGNOSIS — R072 Precordial pain: Secondary | ICD-10-CM

## 2024-05-19 DIAGNOSIS — F419 Anxiety disorder, unspecified: Secondary | ICD-10-CM | POA: Diagnosis not present

## 2024-05-19 DIAGNOSIS — N184 Chronic kidney disease, stage 4 (severe): Secondary | ICD-10-CM | POA: Diagnosis not present

## 2024-05-19 DIAGNOSIS — N179 Acute kidney failure, unspecified: Secondary | ICD-10-CM | POA: Diagnosis not present

## 2024-05-19 LAB — CBC
HCT: 25.4 % — ABNORMAL LOW (ref 36.0–46.0)
Hemoglobin: 8.3 g/dL — ABNORMAL LOW (ref 12.0–15.0)
MCH: 30.6 pg (ref 26.0–34.0)
MCHC: 32.7 g/dL (ref 30.0–36.0)
MCV: 93.7 fL (ref 80.0–100.0)
Platelets: 198 K/uL (ref 150–400)
RBC: 2.71 MIL/uL — ABNORMAL LOW (ref 3.87–5.11)
RDW: 12.6 % (ref 11.5–15.5)
WBC: 12.6 K/uL — ABNORMAL HIGH (ref 4.0–10.5)
nRBC: 0 % (ref 0.0–0.2)

## 2024-05-19 LAB — BASIC METABOLIC PANEL WITH GFR
Anion gap: 12 (ref 5–15)
BUN: 51 mg/dL — ABNORMAL HIGH (ref 8–23)
CO2: 18 mmol/L — ABNORMAL LOW (ref 22–32)
Calcium: 8.3 mg/dL — ABNORMAL LOW (ref 8.9–10.3)
Chloride: 104 mmol/L (ref 98–111)
Creatinine, Ser: 2.59 mg/dL — ABNORMAL HIGH (ref 0.44–1.00)
GFR, Estimated: 20 mL/min — ABNORMAL LOW (ref >60)
Glucose, Bld: 174 mg/dL — ABNORMAL HIGH (ref 70–99)
Potassium: 5 mmol/L (ref 3.5–5.1)
Sodium: 134 mmol/L — ABNORMAL LOW (ref 135–145)

## 2024-05-19 LAB — GLUCOSE, CAPILLARY
Glucose-Capillary: 111 mg/dL — ABNORMAL HIGH (ref 70–99)
Glucose-Capillary: 134 mg/dL — ABNORMAL HIGH (ref 70–99)
Glucose-Capillary: 138 mg/dL — ABNORMAL HIGH (ref 70–99)
Glucose-Capillary: 139 mg/dL — ABNORMAL HIGH (ref 70–99)
Glucose-Capillary: 176 mg/dL — ABNORMAL HIGH (ref 70–99)
Glucose-Capillary: 193 mg/dL — ABNORMAL HIGH (ref 70–99)

## 2024-05-19 LAB — MAGNESIUM: Magnesium: 2 mg/dL (ref 1.7–2.4)

## 2024-05-19 LAB — STREP PNEUMONIAE URINARY ANTIGEN: Strep Pneumo Urinary Antigen: NEGATIVE

## 2024-05-19 LAB — HEPARIN LEVEL (UNFRACTIONATED): Heparin Unfractionated: 0.4 [IU]/mL (ref 0.30–0.70)

## 2024-05-19 MED ORDER — HYDRALAZINE HCL 50 MG PO TABS
50.0000 mg | ORAL_TABLET | Freq: Three times a day (TID) | ORAL | Status: DC
  Administered 2024-05-19 – 2024-05-23 (×12): 50 mg via ORAL
  Filled 2024-05-19 (×12): qty 1

## 2024-05-19 MED ORDER — HYDRALAZINE HCL 25 MG PO TABS
25.0000 mg | ORAL_TABLET | Freq: Once | ORAL | Status: AC
  Administered 2024-05-19: 25 mg via ORAL
  Filled 2024-05-19: qty 1

## 2024-05-19 MED ORDER — FUROSEMIDE 40 MG PO TABS
40.0000 mg | ORAL_TABLET | Freq: Every day | ORAL | Status: DC
  Administered 2024-05-19 – 2024-05-21 (×3): 40 mg via ORAL
  Filled 2024-05-19 (×3): qty 1

## 2024-05-19 MED ORDER — HEPARIN SODIUM (PORCINE) 5000 UNIT/ML IJ SOLN
5000.0000 [IU] | Freq: Three times a day (TID) | INTRAMUSCULAR | Status: DC
  Administered 2024-05-20 – 2024-05-23 (×10): 5000 [IU] via SUBCUTANEOUS
  Filled 2024-05-19 (×10): qty 1

## 2024-05-19 NOTE — Plan of Care (Signed)
  Problem: Fluid Volume: Goal: Hemodynamic stability will improve Outcome: Progressing   Problem: Clinical Measurements: Goal: Diagnostic test results will improve Outcome: Progressing Goal: Signs and symptoms of infection will decrease Outcome: Progressing   Problem: Respiratory: Goal: Ability to maintain adequate ventilation will improve Outcome: Progressing   Problem: Education: Goal: Ability to describe self-care measures that may prevent or decrease complications (Diabetes Survival Skills Education) will improve Outcome: Progressing Goal: Individualized Educational Video(s) Outcome: Progressing   Problem: Coping: Goal: Ability to adjust to condition or change in health will improve Outcome: Progressing   Problem: Fluid Volume: Goal: Ability to maintain a balanced intake and output will improve Outcome: Progressing   Problem: Health Behavior/Discharge Planning: Goal: Ability to identify and utilize available resources and services will improve Outcome: Progressing Goal: Ability to manage health-related needs will improve Outcome: Progressing   Problem: Metabolic: Goal: Ability to maintain appropriate glucose levels will improve Outcome: Progressing   Problem: Nutritional: Goal: Maintenance of adequate nutrition will improve Outcome: Progressing Goal: Progress toward achieving an optimal weight will improve Outcome: Progressing   Problem: Skin Integrity: Goal: Risk for impaired skin integrity will decrease Outcome: Progressing   Problem: Tissue Perfusion: Goal: Adequacy of tissue perfusion will improve Outcome: Progressing   Problem: Activity: Goal: Ability to tolerate increased activity will improve Outcome: Progressing   Problem: Clinical Measurements: Goal: Ability to maintain a body temperature in the normal range will improve Outcome: Progressing   Problem: Respiratory: Goal: Ability to maintain adequate ventilation will improve Outcome:  Progressing Goal: Ability to maintain a clear airway will improve Outcome: Progressing   Problem: Education: Goal: Knowledge of General Education information will improve Description: Including pain rating scale, medication(s)/side effects and non-pharmacologic comfort measures Outcome: Progressing   Problem: Health Behavior/Discharge Planning: Goal: Ability to manage health-related needs will improve Outcome: Progressing   Problem: Clinical Measurements: Goal: Ability to maintain clinical measurements within normal limits will improve Outcome: Progressing Goal: Will remain free from infection Outcome: Progressing Goal: Diagnostic test results will improve Outcome: Progressing Goal: Respiratory complications will improve Outcome: Progressing Goal: Cardiovascular complication will be avoided Outcome: Progressing   Problem: Activity: Goal: Risk for activity intolerance will decrease Outcome: Progressing   Problem: Nutrition: Goal: Adequate nutrition will be maintained Outcome: Progressing   Problem: Coping: Goal: Level of anxiety will decrease Outcome: Progressing   Problem: Elimination: Goal: Will not experience complications related to bowel motility Outcome: Progressing Goal: Will not experience complications related to urinary retention Outcome: Progressing   Problem: Pain Managment: Goal: General experience of comfort will improve and/or be controlled Outcome: Progressing   Problem: Safety: Goal: Ability to remain free from injury will improve Outcome: Progressing   Problem: Skin Integrity: Goal: Risk for impaired skin integrity will decrease Outcome: Progressing

## 2024-05-19 NOTE — Progress Notes (Signed)
 Heart Failure Navigator Progress Note  Assessed for Heart & Vascular TOC clinic readiness.  Patient does not meet criteria due to has a scheduled [facility] appointment on 05/31/2024. No HF TOC. .   Navigator will sign off at this time.   Stephane Haddock, BSN, Scientist, Clinical (histocompatibility And Immunogenetics) Only

## 2024-05-19 NOTE — Plan of Care (Signed)
  Problem: Fluid Volume: Goal: Hemodynamic stability will improve Outcome: Progressing   Problem: Clinical Measurements: Goal: Diagnostic test results will improve Outcome: Progressing Goal: Signs and symptoms of infection will decrease Outcome: Progressing   Problem: Respiratory: Goal: Ability to maintain adequate ventilation will improve Outcome: Progressing   Problem: Education: Goal: Ability to describe self-care measures that may prevent or decrease complications (Diabetes Survival Skills Education) will improve Outcome: Progressing   Problem: Coping: Goal: Ability to adjust to condition or change in health will improve Outcome: Progressing   Problem: Fluid Volume: Goal: Ability to maintain a balanced intake and output will improve Outcome: Progressing   Problem: Health Behavior/Discharge Planning: Goal: Ability to identify and utilize available resources and services will improve Outcome: Progressing Goal: Ability to manage health-related needs will improve Outcome: Progressing   Problem: Nutritional: Goal: Maintenance of adequate nutrition will improve Outcome: Progressing   Problem: Skin Integrity: Goal: Risk for impaired skin integrity will decrease Outcome: Progressing   Problem: Tissue Perfusion: Goal: Adequacy of tissue perfusion will improve Outcome: Progressing   Problem: Respiratory: Goal: Ability to maintain adequate ventilation will improve Outcome: Progressing

## 2024-05-19 NOTE — Progress Notes (Signed)
 Arranged outpatient stress test- see progress note 05/19/24   Informed Consent   Shared Decision Making/Informed Consent The risks [chest pain, shortness of breath, cardiac arrhythmias, dizziness, blood pressure fluctuations, myocardial infarction, stroke/transient ischemic attack, nausea, vomiting, allergic reaction, radiation exposure, metallic taste sensation and life-threatening complications (estimated to be 1 in 10,000)], benefits (risk stratification, diagnosing coronary artery disease, treatment guidance) and alternatives of a nuclear stress test were discussed in detail with Ms. Narvaiz and she agrees to proceed.      Rollo FABIENE Louder, PA-C 05/19/2024 2:06 PM

## 2024-05-19 NOTE — Progress Notes (Signed)
 PHARMACY - ANTICOAGULATION CONSULT NOTE  Pharmacy Consult for Heparin  Indication: chest pain/ACS  Allergies  Allergen Reactions   Singulair  [Montelukast  Sodium] Other (See Comments)    Vivid dreams   Amlodipine  Besylate     Other reaction(s): fatgiue and nausea   Atorvastatin     Other reaction(s): strange feelings   Lisinopril Cough   Metformin Diarrhea   Penicillins Other (See Comments)    Other reaction(s): rash Unknown reaction.    Sulfa Antibiotics Nausea And Vomiting   Welchol  [Colesevelam Hcl]     Other reaction(s): constipation   Dilaudid  [Hydromorphone  Hcl] Anxiety and Other (See Comments)    paranoia   Sulfamethoxazole-Trimethoprim Rash    Patient Measurements: Height: 5' 2 (157.5 cm) Weight: 85.3 kg (188 lb) IBW/kg (Calculated) : 50.1 HEPARIN  DW (KG): 69.4  Vital Signs: Temp: 97.8 F (36.6 C) (11/07 0420) Temp Source: Oral (11/07 0420) BP: 152/63 (11/07 0420)  Labs: Recent Labs    05/17/24 1408 05/17/24 1408 05/17/24 1556 05/17/24 2044 05/17/24 2330 05/18/24 0209 05/18/24 0751 05/18/24 1020 05/18/24 2007 05/19/24 0614  HGB 9.0*  --   --   --   --  8.6*  --   --   --  8.3*  HCT 28.0*  --   --   --   --  28.0*  --   --   --  25.4*  PLT 240  --   --   --   --  182  --   --   --  198  APTT  --   --   --  169*  --   --   --   --   --   --   LABPROT  --   --   --  19.7*  --   --   --   --   --   --   INR  --   --   --  1.6*  --   --   --   --   --   --   HEPARINUNFRC  --    < >  --   --   --  0.34 0.22*  --  0.22* 0.40  CREATININE 2.62*  --   --  2.01*  --   --   --  2.37*  --   --   CKTOTAL  --   --   --  39  --   --   --   --   --   --   TROPONINIHS 251*  --  489* 910* 891*  --   --   --   --   --    < > = values in this interval not displayed.    Estimated Creatinine Clearance: 23 mL/min (A) (by C-G formula based on SCr of 2.37 mg/dL (H)).   Medical History: Past Medical History:  Diagnosis Date   Acid reflux    Anemia    Anxiety     Arthritis    Asthma    infrequent problems   Cardiomyopathy (HCC)    CHF (congestive heart failure) (HCC)    Chronic UTI (urinary tract infection)    Complication of anesthesia    I quit breathing during surgery 2008 - has had surgery since with no problems    Diabetes mellitus    Endometriosis    hx of    Headache    hx of migraines   Hepatitis B    hx of  dx during ruptured appendix hospitalization   Hyperlipemia    Hypertension    IBS (irritable bowel syndrome)    Irritable bowel syndrome    Kidney disease    Obesity    Pneumonia 12/2013   Pneumonia    PONV (postoperative nausea and vomiting)    Rash    sides of neck   Stress incontinence    Tuberculosis 1985    Medications:  Infusions:   cefTRIAXone  (ROCEPHIN )  IV Stopped (05/18/24 1246)   heparin  1,350 Units/hr (05/19/24 0400)    Assessment: Pt is a 68 year old admitted for ACS. Not on anticoagulation PTA.   Heparin  level therapeutic at 0.40 at current rate. CBC stable. Hgb 8.3. Plt 198. Per chart, she has a history of chronic anemia. No signs/symptoms of bleeding or interruptions to the infusion per RN.   Goal of Therapy:  Heparin  level 0.3-0.7 units/ml Monitor platelets by anticoagulation protocol: Yes   Plan:  Continue heparin  at 1350 units/hr.  Heparin  level and CBC daily Monitor for signs/symptoms of bleeding  Elma Fail, PharmD PGY1 Clinical Pharmacist [facility]  05/19/2024 7:52 AM

## 2024-05-19 NOTE — Progress Notes (Addendum)
 Progress Note  Patient Name: Shelly [name] Date of Encounter: 05/19/2024 [facility] Cardiologist: Annabella Scarce, MD  Interval Summary   Patient reports feeling very well this AM. No shortness of breath or orthopnea. No chest pain or lower extremity swelling. BP remains elevated   Vital Signs Vitals:   05/18/24 1937 05/18/24 2356 05/19/24 0420 05/19/24 0748  BP: (!) 210/77 (!) 150/53 (!) 152/63 (!) (P) 183/66  Pulse:      Resp: 18  15   Temp: 98.3 F (36.8 C) (!) 97.5 F (36.4 C) 97.8 F (36.6 C)   TempSrc: Oral Oral Oral   SpO2: 95% 96% 96% (P) 98%  Weight:      Height:        Intake/Output Summary (Last 24 hours) at 05/19/2024 0856 Last data filed at 05/19/2024 0400 Gross per 24 hour  Intake 527.92 ml  Output 1 ml  Net 526.92 ml      05/17/2024    2:04 PM 04/03/2024    8:15 AM 03/30/2024    2:29 PM  Last 3 Weights  Weight (lbs) 188 lb 180 lb 185 lb  Weight (kg) 85.276 kg 81.647 kg 83.915 kg      Telemetry/ECG  NSR - Personally Reviewed  Physical Exam  GEN: No acute distress.  Sitting comfortably in the bed  Neck: No JVD Cardiac:  RRR, no murmurs, rubs, or gallops. Radial pulses 2+ bilaterally  Respiratory: Clear to auscultation bilaterally. Normal WOB on room air  GI: Soft, nontender, non-distended  MS: No edema in BLE  Assessment & Plan   NSTEMI  - Previous cath in 2015 showed no CAD  - Patient denied having chest pain prior to admission. Did have tightness in her throat, thought it was reflux symptoms  - hsTn [id number]  - EKG showed sinus rhythm with possible age-indeterminate anterior infarct with ST and T wave abnormality though unchanged from her baseline  - Patient also being treated for UTI, AKI, acute on chronic anemia, acute on chronic CHF. Suspect that there is some degree of demand ischemia. Planning on ischemic evaluation this admission pending renal function. She is now euvolemic on exam, but creatinine increased to 2.59. No  further episodes of chest pain. May be able to proceed with stress test? Will discuss with MD  - Continue IV heparin   - Continue ASA 81 mg daily, carvedilol  25 mg BID   Acute on chronic diastolic heart failure  Mild-moderate MR  Mild AS  - Echocardiogram from 05/03/24 showed EF 55%, no regional wall motion abnormalities, grade I DD, normal RV systolic function, mild-moderate MR, mild AS  - BNP elevated to 740  - CXR yesterday showed progressive bilateral pleural effusions, mild interstitial edema  - Given IV lasix  60 mg yesterday. I/Os and weights not measured. Creatinine increased to 2.59  - Euvolemic on exam. No SOB or orthopnea  - Hold further diuretics  - Continue current antihypertensives, increase hydralazine  to 50 mg TID   AKI on CKD stage IIIb  - Creatinine had previously been around 2.2 in 02/2024  - Presented with creatinine of 2.62. Initially improved to 2.01. Now up to 2.59  - Euvolemic on exam. Hold further diuretics  - Follows with nephrology   HTN  - BP remains elevated  - Continue carvedilol  25 mg BID  - Continue clonidine  patch 0.2 mg weekly  - Increase hydralazine  to 50 mg TID  - Of note, patient has amlodipine  listed as a medication allergy with side effect  of fatigue. Patient tells me that she tried amlodipine  when she was on 10+ medications and she is not sure what was causing her fatigue. Would be willing to retry in the future if needed  - Followed by Dr. Raford with advanced HTN   Acute on chronic anemia  - Hemoglobin 8.3 today   Otherwise per primary  - Hypomagnesemia  - GERD  - Type 2 DM  - Anxiety    For questions or updates, please contact [facility] Please consult [email or web address] for contact info under       Signed, Rollo FABIENE Louder, PA-C   Patient seen, examined. Available data reviewed. Agree with findings, assessment, and plan as outlined by JORETTA Louder, PA-C.  Patient independently interviewed and examined.  HEENT is normal,  JVP is normal, lungs are clear bilaterally, heart is regular rate and rhythm with a 2/6 systolic murmur at the left lower sternal border, abdomen is soft and nontender, extremities have no edema.  The patient is clinically much improved after a dose of IV furosemide  yesterday.  Her creatinine is back up today, and her renal function is going to limit our ability to perform any contrast studies.  I am not convinced that she really had ACS and this may be demand ischemia in the setting of acute heart failure.  I am going to order a Lexiscan Myoview stress test to be done as an outpatient to risk stratify her.  OK to stop heparin  at this time with no angina or further evidence of ACS. I think she should be on a daily oral loop diuretic and we will add furosemide  40 mg.  Otherwise, as outlined above.  [name], M.D. 05/19/2024 12:52 PM

## 2024-05-19 NOTE — Progress Notes (Signed)
 PROGRESS NOTE        PATIENT DETAILS Name: Shelly [name] Age: 68 y.o. Sex: female Date of Birth: 1955/11/21 Admit Date: 05/17/2024 Admitting Physician Blease Quiver, MD ERE:Dyjt, Joen, MD  Brief Summary: Patient is a 68 y.o.  female with history of chronic HFpEF, HTN, HLD, DM-2-who presented with worsening shortness of breath-patient was found to have HFpEF exacerbation/non-STEMI/hypoxemia-and subsequently admitted to the hospitalist service.  Significant events: 11/5>> admit to [facility]  Significant studies: 10/22>> echo: EF 55%, mild to moderate mitral valve regurgitation 11/5>> CXR: New small right pleural effusion/small left pleural fluid.  Mild vascular congestion. 11/5>> CT abdomen/pelvis: Nodular consolidation in the lingula-atelectasis or focus of pneumonia.  Moderate nonspecific perinephric stranding. 11/6>> VQ scan: No perfusion defects.  Significant microbiology data: 11/5>> COVID/influenza/RSV PCR: Negative 11/5>> respiratory virus panel: Negative 11/5>> blood culture: No growth  Procedures: None  Consults: Cardiology  Subjective: Feels so much better-claims she was able to lie flat in bed.  No chest pain.    Objective: Vitals: Blood pressure (!) (P) 183/66, pulse 91, temperature 97.6 F (36.4 C), temperature source Oral, resp. rate 15, height 5' 2 (1.575 m), weight 85.3 kg, SpO2 (P) 98%.   Exam: Gen Exam:Alert awake-not in any distress HEENT:atraumatic, normocephalic Chest: B/L clear to auscultation anteriorly CVS:S1S2 regular + systolic murmur Abdomen:soft non tender, non distended Extremities: Trace edema Neurology: Non focal Skin: no rash  Pertinent Labs/Radiology:    Latest Ref Rng & Units 05/19/2024    6:14 AM 05/18/2024    2:09 AM 05/17/2024    2:08 PM  CBC  WBC 4.0 - 10.5 K/uL 12.6  11.5  15.0   Hemoglobin 12.0 - 15.0 g/dL 8.3  8.6  9.0   Hematocrit 36.0 - 46.0 % 25.4  28.0  28.0   Platelets 150 - 400 K/uL  198  182  240     Lab Results  Component Value Date   NA 134 (L) 05/19/2024   K 5.0 05/19/2024   CL 104 05/19/2024   CO2 18 (L) 05/19/2024      Assessment/Plan: Non-STEMI No further chest pain Respiratory status much better On IV heparin /aspirin /beta-blocker Cardiology following-tentative plans for LHC depending on renal function.  Acute on chronic HFpEF Euvolemic on exam-except for mild/trace leg edema Further diuretics on hold due to bump in renal function Continue to monitor electrolytes/intake/output/daily weights.  Acute hypoxic respiratory failure Secondary to HFpEF exacerbation Overall improved-volume status now markedly better Discussed with RN-titrate of oxygen as tolerated.  HTN BP relatively stable Continue Coreg /hydralazine marvie clonidine   AKI on CKD stage 4 Creatinine continues to fluctuate-suspect this is secondary to better blood pressure control/diuretic regimen Hopefully it will settle down/plateau  Avoid nephrotoxic agents Follow renal function.  Normocytic anemia Suspect this is from underlying CKD Follow CBC and transfuse if significant drop  DM-2 (A1c 6.9 on 11/5) CBG stable SSI  Recent Labs    05/19/24 0017 05/19/24 0422 05/19/24 0807  GLUCAP 193* 176* 138*    HLD Intolerant to statin per allergy list Continue Zetia  GERD PPI  Asymptomatic bacteriuria No clinical features consistent with UTI CT does show some perinephric stranding but nonspecific-with no corresponding clinical features. Stop Rocephin   Nodular consolidation in lingula Incidental finding on CT chest No clinical symptoms suggestive of PNA Doubt requires antibiotics Needs repeat CT imaging in a few months to document resolution of  this lesion.  Anxiety disorder As needed Xanax   Class 2 Obesity: Estimated body mass index is 34.39 kg/m as calculated from the following:   Height as of this encounter: 5' 2 (1.575 m).   Weight as of this encounter:  85.3 kg.   Code status:   Code Status: Full Code   DVT Prophylaxis:IV Heparin     Family Communication: None at bedside   Disposition Plan: Status is: Inpatient Remains inpatient appropriate because: Severity of illness   Planned Discharge Destination:Home   Diet: Diet Order             Diet heart healthy/carb modified Room service appropriate? Yes; Fluid consistency: Thin  Diet effective now                     Antimicrobial agents: Anti-infectives (From admission, onward)    Start     Dose/Rate Route Frequency Ordered Stop   05/19/24 1200  vancomycin (VANCOREADY) IVPB 500 mg/100 mL  Status:  Discontinued        500 mg 100 mL/hr over 60 Minutes Intravenous Every 48 hours 05/17/24 2115 05/17/24 2118   05/19/24 1200  vancomycin (VANCOREADY) IVPB 750 mg/150 mL  Status:  Discontinued        750 mg 150 mL/hr over 60 Minutes Intravenous Every 48 hours 05/17/24 2118 05/18/24 0854   05/18/24 1600  ceFEPIme (MAXIPIME) 2 g in sodium chloride  0.9 % 100 mL IVPB  Status:  Discontinued        2 g 200 mL/hr over 30 Minutes Intravenous Every 24 hours 05/17/24 2115 05/18/24 0854   05/18/24 1000  cefTRIAXone  (ROCEPHIN ) 1 g in sodium chloride  0.9 % 100 mL IVPB        1 g 200 mL/hr over 30 Minutes Intravenous Every 24 hours 05/18/24 0854     05/18/24 0400  metroNIDAZOLE  (FLAGYL ) IVPB 500 mg  Status:  Discontinued        500 mg 100 mL/hr over 60 Minutes Intravenous Every 12 hours 05/17/24 2047 05/18/24 0854   05/17/24 2100  metroNIDAZOLE  (FLAGYL ) IVPB 500 mg  Status:  Discontinued        500 mg 100 mL/hr over 60 Minutes Intravenous Every 12 hours 05/17/24 2047 05/17/24 2047   05/17/24 1615  ceFEPIme (MAXIPIME) 2 g in sodium chloride  0.9 % 100 mL IVPB        2 g 200 mL/hr over 30 Minutes Intravenous  Once 05/17/24 1605 05/17/24 1649   05/17/24 1615  metroNIDAZOLE  (FLAGYL ) IVPB 500 mg        500 mg 100 mL/hr over 60 Minutes Intravenous  Once 05/17/24 1605 05/17/24 1727   05/17/24  1615  vancomycin (VANCOCIN) IVPB 1000 mg/200 mL premix        1,000 mg 200 mL/hr over 60 Minutes Intravenous  Once 05/17/24 1605 05/17/24 1726        MEDICATIONS: Scheduled Meds:  aspirin  EC  81 mg Oral Daily   carvedilol   25 mg Oral BID WC   cloNIDine   0.2 mg Transdermal Weekly   ezetimibe  10 mg Oral Daily   hydrALAZINE   25 mg Oral Once   hydrALAZINE   50 mg Oral Q8H   insulin  aspart  0-9 Units Subcutaneous Q4H   melatonin  5 mg Oral QHS   pantoprazole   40 mg Oral Daily   technetium albumin aggregated  4.4 millicurie Intravenous Once   Continuous Infusions:  cefTRIAXone  (ROCEPHIN )  IV Stopped (05/18/24 1246)   heparin  1,350 Units/hr (  05/19/24 0400)   PRN Meds:.acetaminophen  **OR** acetaminophen , albuterol , ALPRAZolam , fentaNYL  (SUBLIMAZE ) injection, hydrALAZINE , HYDROcodone -acetaminophen , labetalol, nitroGLYCERIN , ondansetron  **OR** ondansetron  (ZOFRAN ) IV   I have personally reviewed following labs and imaging studies  LABORATORY DATA: CBC: Recent Labs  Lab 05/17/24 1408 05/18/24 0209 05/19/24 0614  WBC 15.0* 11.5* 12.6*  HGB 9.0* 8.6* 8.3*  HCT 28.0* 28.0* 25.4*  MCV 95.9 99.3 93.7  PLT 240 182 198    Basic Metabolic Panel: Recent Labs  Lab 05/17/24 1408 05/17/24 2044 05/18/24 0209 05/18/24 1020 05/19/24 0614  NA 131* 138  --  133* 134*  K 5.1 3.6  --  5.1 5.0  CL 103 113*  --  106 104  CO2 18* 17*  --  17* 18*  GLUCOSE 204* 116*  --  251* 174*  BUN 47* 36*  --  37* 51*  CREATININE 2.62* 2.01*  --  2.37* 2.59*  CALCIUM  8.1* 6.5*  --  8.2* 8.3*  MG  --  1.2*  --   --   --   PHOS  --  2.5 3.4  --   --     GFR: Estimated Creatinine Clearance: 21.1 mL/min (A) (by C-G formula based on SCr of 2.59 mg/dL (H)).  Liver Function Tests: Recent Labs  Lab 05/17/24 1556  AST 14*  ALT 11  ALKPHOS 51  BILITOT 0.9  PROT 6.7  ALBUMIN 2.9*   Recent Labs  Lab 05/17/24 1556  LIPASE 18   No results for input(s): AMMONIA in the last 168  hours.  Coagulation Profile: Recent Labs  Lab 05/17/24 2044  INR 1.6*    Cardiac Enzymes: Recent Labs  Lab 05/17/24 2044  CKTOTAL 39    BNP (last 3 results) No results for input(s): PROBNP in the last 8760 hours.  Lipid Profile: No results for input(s): CHOL, HDL, LDLCALC, TRIG, CHOLHDL, LDLDIRECT in the last 72 hours.  Thyroid  Function Tests: Recent Labs    05/17/24 2040 05/17/24 2044  TSH 0.791  --   FREET4  --  1.08    Anemia Panel: Recent Labs    05/17/24 2040 05/17/24 2044 05/18/24 0209  VITAMINB12  --   --  323  FOLATE  --   --  11.5  FERRITIN  --  52  --   TIBC  --  206*  --   IRON   --  12*  --   RETICCTPCT 2.1  --   --     Urine analysis:    Component Value Date/Time   COLORURINE YELLOW 05/17/2024 1759   APPEARANCEUR CLEAR 05/17/2024 1759   LABSPEC 1.011 05/17/2024 1759   PHURINE 5.0 05/17/2024 1759   GLUCOSEU NEGATIVE 05/17/2024 1759   HGBUR SMALL (A) 05/17/2024 1759   BILIRUBINUR NEGATIVE 05/17/2024 1759   KETONESUR NEGATIVE 05/17/2024 1759   PROTEINUR 100 (A) 05/17/2024 1759   UROBILINOGEN 0.2 04/20/2015 1418   NITRITE POSITIVE (A) 05/17/2024 1759   LEUKOCYTESUR TRACE (A) 05/17/2024 1759    Sepsis Labs: Lactic Acid, Venous    Component Value Date/Time   LATICACIDVEN 1.1 05/17/2024 2330    MICROBIOLOGY: Recent Results (from the past 240 hours)  Blood culture (routine x 2)     Status: None (Preliminary result)   Collection Time: 05/17/24  3:54 PM   Specimen: BLOOD LEFT ARM  Result Value Ref Range Status   Specimen Description BLOOD LEFT ARM  Final   Special Requests   Final    BOTTLES DRAWN AEROBIC AND ANAEROBIC Blood Culture adequate volume  Culture   Final    NO GROWTH 2 DAYS Performed at [facility], [address], [place], [place] [address]    Report Status PENDING  Incomplete  Resp panel by RT-PCR (RSV, Flu A&B, Covid) Anterior Nasal Swab     Status: None   Collection Time: 05/17/24  7:19 PM    Specimen: Anterior Nasal Swab  Result Value Ref Range Status   SARS Coronavirus 2 by RT PCR NEGATIVE NEGATIVE Final   Influenza A by PCR NEGATIVE NEGATIVE Final   Influenza B by PCR NEGATIVE NEGATIVE Final    Comment: (NOTE) The Xpert Xpress SARS-CoV-2/FLU/RSV plus assay is intended as an aid in the diagnosis of influenza from Nasopharyngeal swab specimens and should not be used as a sole basis for treatment. Nasal washings and aspirates are unacceptable for Xpert Xpress SARS-CoV-2/FLU/RSV testing.  Fact Sheet for Patients: bloggercourse.com  Fact Sheet for Healthcare Providers: seriousbroker.it  This test is not yet approved or cleared by the [place]  FDA and has been authorized for detection and/or diagnosis of SARS-CoV-2 by FDA under an Emergency Use Authorization (EUA). This EUA will remain in effect (meaning this test can be used) for the duration of the COVID-19 declaration under Section 564(b)(1) of the Act, 21 U.S.C. section 360bbb-3(b)(1), unless the authorization is terminated or revoked.     Resp Syncytial Virus by PCR NEGATIVE NEGATIVE Final    Comment: (NOTE) Fact Sheet for Patients: bloggercourse.com  Fact Sheet for Healthcare Providers: seriousbroker.it  This test is not yet approved or cleared by the [place]  FDA and has been authorized for detection and/or diagnosis of SARS-CoV-2 by FDA under an Emergency Use Authorization (EUA). This EUA will remain in effect (meaning this test can be used) for the duration of the COVID-19 declaration under Section 564(b)(1) of the Act, 21 U.S.C. section 360bbb-3(b)(1), unless the authorization is terminated or revoked.  Performed at [facility], [address] [place], [place] [address]   Respiratory (~20 pathogens) panel by PCR     Status: None   Collection Time: 05/17/24  7:19 PM   Specimen:  Nasopharyngeal Swab; Respiratory  Result Value Ref Range Status   Adenovirus NOT DETECTED NOT DETECTED Final   Coronavirus 229E NOT DETECTED NOT DETECTED Final    Comment: (NOTE) The Coronavirus on the Respiratory Panel, DOES NOT test for the novel  Coronavirus (2019 nCoV)    Coronavirus HKU1 NOT DETECTED NOT DETECTED Final   Coronavirus NL63 NOT DETECTED NOT DETECTED Final   Coronavirus OC43 NOT DETECTED NOT DETECTED Final   Metapneumovirus NOT DETECTED NOT DETECTED Final   Rhinovirus / Enterovirus NOT DETECTED NOT DETECTED Final   Influenza A NOT DETECTED NOT DETECTED Final   Influenza B NOT DETECTED NOT DETECTED Final   Parainfluenza Virus 1 NOT DETECTED NOT DETECTED Final   Parainfluenza Virus 2 NOT DETECTED NOT DETECTED Final   Parainfluenza Virus 3 NOT DETECTED NOT DETECTED Final   Parainfluenza Virus 4 NOT DETECTED NOT DETECTED Final   Respiratory Syncytial Virus NOT DETECTED NOT DETECTED Final   Bordetella pertussis NOT DETECTED NOT DETECTED Final   Bordetella Parapertussis NOT DETECTED NOT DETECTED Final   Chlamydophila pneumoniae NOT DETECTED NOT DETECTED Final   Mycoplasma pneumoniae NOT DETECTED NOT DETECTED Final    Comment: Performed at [facility], [address] [place], [place] [address]    RADIOLOGY STUDIES/RESULTS: NM Pulmonary Perfusion Result Date: 05/18/2024 EXAM: NM Lung Perfusion Scan. CLINICAL HISTORY: Dyspnea on exertion (  DOE). TECHNIQUE: Radiolabeled MAA was administered intravenously and planar images of the lungs were obtained in multiple projections. RADIOPHARMACEUTICAL: MAA, 4.3 ml administered intravenously. COMPARISON: Chest radiograph 05/18/2024. FINDINGS: PERFUSION: No wedge-shaped peripheral perfusion defect within left or right lung to suggest acute pulmonary embolism. Normal perfusion pattern. Decreased regional attenuation in the lower lobes related to pleural effusions on radiograph. IMPRESSION: 1. No perfusion defects to indicate pulmonary  embolism. 2. Decreased regional attenuation in the lower lobes related to pleural effusions on radiograph. Electronically signed by: Norleen Boxer MD 05/18/2024 03:08 PM EST RP Workstation: [id number]   DG CHEST PORT 1 VIEW Result Date: 05/18/2024 EXAM: 1 VIEW(S) XRAY OF THE CHEST 05/18/2024 05:30:00 AM COMPARISON: 05/17/2024 CLINICAL HISTORY: Shortness of breath FINDINGS: LUNGS AND PLEURA: Mild interstitial edema. Progressive bilateral pleural effusions resulting in veil-like opacification over the lower lung zones. No pneumothorax. HEART AND MEDIASTINUM: Mild cardiomegaly. Aortic atherosclerosis. BONES AND SOFT TISSUES: No acute osseous abnormality. IMPRESSION: 1. Progressive bilateral pleural effusions causing veil-like opacification over the lower lung zones. 2. Mild interstitial edema. Electronically signed by: Waddell Calk MD 05/18/2024 06:04 AM EST RP Workstation: [id number]   CT ABDOMEN PELVIS WO CONTRAST Result Date: 05/17/2024 CLINICAL DATA:  Sepsis abdominal pain EXAM: CT ABDOMEN AND PELVIS WITHOUT CONTRAST TECHNIQUE: Multidetector CT imaging of the abdomen and pelvis was performed following the standard protocol without IV contrast. RADIATION DOSE REDUCTION: This exam was performed according to the departmental dose-optimization program which includes automated exposure control, adjustment of the mA and/or kV according to patient size and/or use of iterative reconstruction technique. COMPARISON:  MRI 04/13/2024, CT 04/07/2023 FINDINGS: Lower chest: Lung bases demonstrate small bilateral pleural effusions. Cardiomegaly. Differential blood cardiac pool suggesting anemia. Nodular consolidation at the lingula. Mild smooth septal thickening suggestive of mild pulmonary edema with minimal ground-glass disease. Partial atelectasis versus pneumonia at the lower lobes. Hepatobiliary: No focal liver abnormality is seen. Status post cholecystectomy. No biliary dilatation. Pancreas: Atrophic.  No inflammation  Spleen: Normal in size without focal abnormality. Adrenals/Urinary Tract: Adrenal glands are normal. Moderate nonspecific perinephric stranding and small volume fluid. Slightly atrophic right kidney. No hydronephrosis. The bladder is normal Stomach/Bowel: Stomach nonenlarged. Metallic density or clip within the body of the stomach. No acute bowel wall thickening. Diverticula of the colon without acute wall thickening. Surgical anastomosis in the pelvis. Vascular/Lymphatic: Aortic atherosclerosis. No enlarged abdominal or pelvic lymph nodes. Reproductive: Hysterectomy.  No adnexal mass Other: No free air. Small volume free fluid in the pelvis. Generalized subcutaneous edema. Musculoskeletal: No acute or suspicious osseous abnormality IMPRESSION: 1. Small bilateral pleural effusions with partial atelectasis versus pneumonia at the lower lobes. Mild smooth septal thickening suggestive of mild pulmonary edema. Cardiomegaly. 2. Nodular consolidation at the lingula, atelectasis or focus of pneumonia. Imaging follow-up to resolution is recommended. 3. Moderate nonspecific perinephric stranding and small volume fluid, slightly increased compared to prior, correlate for upper UTI. No hydronephrosis. Slightly atrophic right kidney. 4. Small volume free fluid in the pelvis. Generalized subcutaneous edema. 5. Diverticulosis without acute inflammatory process. 6. Aortic atherosclerosis. Aortic Atherosclerosis (ICD10-I70.0). Electronically Signed   By: Luke Bun M.D.   On: 05/17/2024 23:57   DG Chest 2 View Result Date: 05/17/2024 CLINICAL DATA:  Shortness of breath and chest pain. EXAM: CHEST - 2 VIEW COMPARISON:  03/06/2024 FINDINGS: Lungs are adequately inflated without focal lobar consolidation. Small amount right pleural fluid which is new and possible small amount left pleural fluid. Mild prominence of the central pulmonary vessels which may be due to  mild degree of vascular congestion. Cardiomediastinal silhouette  and remainder of the exam is unchanged. IMPRESSION: 1. New small right pleural effusion and possible small amount left pleural fluid. 2. Suggestion of mild vascular congestion. Electronically Signed   By: Toribio Agreste M.D.   On: 05/17/2024 15:14     LOS: 2 days   Donalda Applebaum, MD  [facility]    To contact the attending provider between 7A-7P or the covering provider during after hours 7P-7A, please log into the web site [email or web address] and access using universal [place] password for that web site. If you do not have the password, please call the hospital operator.  05/19/2024, 9:43 AM

## 2024-05-20 DIAGNOSIS — N184 Chronic kidney disease, stage 4 (severe): Secondary | ICD-10-CM | POA: Diagnosis not present

## 2024-05-20 DIAGNOSIS — I214 Non-ST elevation (NSTEMI) myocardial infarction: Secondary | ICD-10-CM | POA: Diagnosis not present

## 2024-05-20 DIAGNOSIS — N179 Acute kidney failure, unspecified: Secondary | ICD-10-CM | POA: Diagnosis not present

## 2024-05-20 DIAGNOSIS — F419 Anxiety disorder, unspecified: Secondary | ICD-10-CM | POA: Diagnosis not present

## 2024-05-20 LAB — CBC
HCT: 25.8 % — ABNORMAL LOW (ref 36.0–46.0)
Hemoglobin: 8.3 g/dL — ABNORMAL LOW (ref 12.0–15.0)
MCH: 30.4 pg (ref 26.0–34.0)
MCHC: 32.2 g/dL (ref 30.0–36.0)
MCV: 94.5 fL (ref 80.0–100.0)
Platelets: 249 K/uL (ref 150–400)
RBC: 2.73 MIL/uL — ABNORMAL LOW (ref 3.87–5.11)
RDW: 12.9 % (ref 11.5–15.5)
WBC: 14.5 K/uL — ABNORMAL HIGH (ref 4.0–10.5)
nRBC: 0 % (ref 0.0–0.2)

## 2024-05-20 LAB — URINE CULTURE

## 2024-05-20 LAB — BASIC METABOLIC PANEL WITH GFR
Anion gap: 12 (ref 5–15)
BUN: 58 mg/dL — ABNORMAL HIGH (ref 8–23)
CO2: 20 mmol/L — ABNORMAL LOW (ref 22–32)
Calcium: 8.4 mg/dL — ABNORMAL LOW (ref 8.9–10.3)
Chloride: 102 mmol/L (ref 98–111)
Creatinine, Ser: 2.86 mg/dL — ABNORMAL HIGH (ref 0.44–1.00)
GFR, Estimated: 17 mL/min — ABNORMAL LOW (ref >60)
Glucose, Bld: 129 mg/dL — ABNORMAL HIGH (ref 70–99)
Potassium: 5 mmol/L (ref 3.5–5.1)
Sodium: 134 mmol/L — ABNORMAL LOW (ref 135–145)

## 2024-05-20 LAB — GLUCOSE, CAPILLARY
Glucose-Capillary: 132 mg/dL — ABNORMAL HIGH (ref 70–99)
Glucose-Capillary: 133 mg/dL — ABNORMAL HIGH (ref 70–99)
Glucose-Capillary: 135 mg/dL — ABNORMAL HIGH (ref 70–99)
Glucose-Capillary: 135 mg/dL — ABNORMAL HIGH (ref 70–99)
Glucose-Capillary: 145 mg/dL — ABNORMAL HIGH (ref 70–99)
Glucose-Capillary: 181 mg/dL — ABNORMAL HIGH (ref 70–99)

## 2024-05-20 LAB — T3: T3, Total: 93 ng/dL (ref 71–180)

## 2024-05-20 MED ORDER — BISACODYL 10 MG RE SUPP: 10.0000 mg | Freq: Every day | RECTAL | Status: DC | PRN

## 2024-05-20 MED ORDER — POLYETHYLENE GLYCOL 3350 17 G PO PACK
17.0000 g | PACK | Freq: Every day | ORAL | Status: DC
  Administered 2024-05-20 – 2024-05-23 (×4): 17 g via ORAL
  Filled 2024-05-20 (×4): qty 1

## 2024-05-20 MED ORDER — SENNOSIDES-DOCUSATE SODIUM 8.6-50 MG PO TABS
2.0000 | ORAL_TABLET | Freq: Every day | ORAL | Status: DC
  Administered 2024-05-20 – 2024-05-22 (×3): 2 via ORAL
  Filled 2024-05-20 (×3): qty 2

## 2024-05-20 NOTE — Plan of Care (Signed)
  Problem: Fluid Volume: Goal: Hemodynamic stability will improve Outcome: Progressing   Problem: Clinical Measurements: Goal: Diagnostic test results will improve Outcome: Progressing Goal: Signs and symptoms of infection will decrease Outcome: Progressing   Problem: Respiratory: Goal: Ability to maintain adequate ventilation will improve Outcome: Progressing   Problem: Education: Goal: Ability to describe self-care measures that may prevent or decrease complications (Diabetes Survival Skills Education) will improve Outcome: Progressing Goal: Individualized Educational Video(s) Outcome: Progressing   Problem: Coping: Goal: Ability to adjust to condition or change in health will improve Outcome: Progressing   Problem: Fluid Volume: Goal: Ability to maintain a balanced intake and output will improve Outcome: Progressing   Problem: Health Behavior/Discharge Planning: Goal: Ability to identify and utilize available resources and services will improve Outcome: Progressing Goal: Ability to manage health-related needs will improve Outcome: Progressing   Problem: Metabolic: Goal: Ability to maintain appropriate glucose levels will improve Outcome: Progressing   Problem: Nutritional: Goal: Maintenance of adequate nutrition will improve Outcome: Progressing Goal: Progress toward achieving an optimal weight will improve Outcome: Progressing   Problem: Skin Integrity: Goal: Risk for impaired skin integrity will decrease Outcome: Progressing   Problem: Tissue Perfusion: Goal: Adequacy of tissue perfusion will improve Outcome: Progressing   Problem: Activity: Goal: Ability to tolerate increased activity will improve Outcome: Progressing   Problem: Clinical Measurements: Goal: Ability to maintain a body temperature in the normal range will improve Outcome: Progressing   Problem: Respiratory: Goal: Ability to maintain adequate ventilation will improve Outcome:  Progressing Goal: Ability to maintain a clear airway will improve Outcome: Progressing   Problem: Education: Goal: Knowledge of General Education information will improve Description: Including pain rating scale, medication(s)/side effects and non-pharmacologic comfort measures Outcome: Progressing   Problem: Health Behavior/Discharge Planning: Goal: Ability to manage health-related needs will improve Outcome: Progressing   Problem: Clinical Measurements: Goal: Ability to maintain clinical measurements within normal limits will improve Outcome: Progressing Goal: Will remain free from infection Outcome: Progressing Goal: Diagnostic test results will improve Outcome: Progressing Goal: Respiratory complications will improve Outcome: Progressing Goal: Cardiovascular complication will be avoided Outcome: Progressing   Problem: Activity: Goal: Risk for activity intolerance will decrease Outcome: Progressing   Problem: Nutrition: Goal: Adequate nutrition will be maintained Outcome: Progressing   Problem: Coping: Goal: Level of anxiety will decrease Outcome: Progressing   Problem: Elimination: Goal: Will not experience complications related to bowel motility Outcome: Progressing Goal: Will not experience complications related to urinary retention Outcome: Progressing   Problem: Pain Managment: Goal: General experience of comfort will improve and/or be controlled Outcome: Progressing   Problem: Safety: Goal: Ability to remain free from injury will improve Outcome: Progressing   Problem: Skin Integrity: Goal: Risk for impaired skin integrity will decrease Outcome: Progressing

## 2024-05-20 NOTE — Plan of Care (Signed)
  Problem: Fluid Volume: Goal: Hemodynamic stability will improve Outcome: Progressing   Problem: Clinical Measurements: Goal: Diagnostic test results will improve Outcome: Progressing Goal: Signs and symptoms of infection will decrease Outcome: Progressing   Problem: Respiratory: Goal: Ability to maintain adequate ventilation will improve Outcome: Progressing   Problem: Education: Goal: Ability to describe self-care measures that may prevent or decrease complications (Diabetes Survival Skills Education) will improve Outcome: Progressing   Problem: Coping: Goal: Ability to adjust to condition or change in health will improve Outcome: Progressing   Problem: Fluid Volume: Goal: Ability to maintain a balanced intake and output will improve Outcome: Progressing   Problem: Metabolic: Goal: Ability to maintain appropriate glucose levels will improve Outcome: Progressing

## 2024-05-20 NOTE — Progress Notes (Signed)
 See progress note yesterday.  Outpatient stress testing arranged.  Will ensure follow-up.  For any questions let us  know but we will sign off thanks.

## 2024-05-20 NOTE — Progress Notes (Signed)
 PROGRESS NOTE        PATIENT DETAILS Name: Shelly [name] Age: 68 y.o. Sex: female Date of Birth: 1956-03-15 Admit Date: 05/17/2024 Admitting Physician Blease Quiver, MD ERE:Dyjt, Joen, MD  Brief Summary: Patient is a 68 y.o.  female with history of chronic HFpEF, HTN, HLD, DM-2-who presented with worsening shortness of breath-patient was found to have HFpEF exacerbation/non-STEMI/hypoxemia-and subsequently admitted to the hospitalist service.  Significant events: 11/5>> admit to [facility]  Significant studies: 10/22>> echo: EF 55%, mild to moderate mitral valve regurgitation 11/5>> CXR: New small right pleural effusion/small left pleural fluid.  Mild vascular congestion. 11/5>> CT abdomen/pelvis: Nodular consolidation in the lingula-atelectasis or focus of pneumonia.  Moderate nonspecific perinephric stranding. 11/6>> VQ scan: No perfusion defects.  Significant microbiology data: 11/5>> COVID/influenza/RSV PCR: Negative 11/5>> respiratory virus panel: Negative 11/5>> blood culture: No growth  Procedures: None  Consults: Cardiology  Subjective: Had some nausea yesterday-although better-just does not feel that she is back to her baseline-feels weak and fatigued this morning.  Objective: Vitals: Blood pressure (!) 144/57, pulse 72, temperature 98.3 F (36.8 C), temperature source Oral, resp. rate (!) 22, height 5' 2 (1.575 m), weight 85.3 kg, SpO2 (!) 82%.   Exam: Gen Exam:Alert awake-not in any distress HEENT:atraumatic, normocephalic Chest: B/L clear to auscultation anteriorly CVS:S1S2 regular Abdomen:soft non tender, non distended Extremities:no edema Neurology: Non focal Skin: no rash  Pertinent Labs/Radiology:    Latest Ref Rng & Units 05/20/2024    4:02 AM 05/19/2024    6:14 AM 05/18/2024    2:09 AM  CBC  WBC 4.0 - 10.5 K/uL 14.5  12.6  11.5   Hemoglobin 12.0 - 15.0 g/dL 8.3  8.3  8.6   Hematocrit 36.0 - 46.0 % 25.8  25.4   28.0   Platelets 150 - 400 K/uL 249  198  182     Lab Results  Component Value Date   NA 134 (L) 05/20/2024   K 5.0 05/20/2024   CL 102 05/20/2024   CO2 20 (L) 05/20/2024      Assessment/Plan: Non-STEMI No further chest pain Respiratory status much better No longer on IV heparin  Due to renal function-LHC deferred-stress test ordered for 11/10. Continue continue aspirin /beta-blocker-intolerant to statin. Cardiology following.  Acute on chronic HFpEF Euvolemic on exam On oral diuretics Continue to monitor electrolytes/intake/output/daily weights.   Acute hypoxic respiratory failure Secondary to HFpEF exacerbation Overall better-titrated to room air at rest today.  HTN BP relatively stable Continue Coreg /hydralazine /transdermal clonidine   AKI on CKD stage 4 Creatinine continues to slowly uptrend-probably secondary to better blood pressure control Cautiously continue oral diuretics today Avoid nephrotoxic agents Repeat electrolytes tomorrow.   Normocytic anemia Suspect this is from underlying CKD Follow CBC and transfuse if significant drop  DM-2 (A1c 6.9 on 11/5) CBG stable SSI  Recent Labs    05/20/24 0050 05/20/24 0451 05/20/24 0750  GLUCAP 135* 133* 135*    HLD Intolerant to statin per allergy list Continue Zetia  GERD PPI  Asymptomatic bacteriuria No clinical features consistent with UTI CT does show some perinephric stranding but nonspecific-with no corresponding clinical features. Stop Rocephin   Nodular consolidation in lingula Incidental finding on CT chest No clinical symptoms suggestive of PNA Doubt requires antibiotics Needs repeat CT imaging in a few months to document resolution of this lesion.  Anxiety disorder As needed Xanax   Class  2 Obesity: Estimated body mass index is 34.39 kg/m as calculated from the following:   Height as of this encounter: 5' 2 (1.575 m).   Weight as of this encounter: 85.3 kg.   Code status:   Code  Status: Full Code   DVT Prophylaxis:SQ Heparin    Family Communication: None at bedside   Disposition Plan: Status is: Inpatient Remains inpatient appropriate because: Severity of illness   Planned Discharge Destination:Home likely on 11/9   Diet: Diet Order             Diet heart healthy/carb modified Room service appropriate? Yes; Fluid consistency: Thin  Diet effective now                     Antimicrobial agents: Anti-infectives (From admission, onward)    Start     Dose/Rate Route Frequency Ordered Stop   05/19/24 1200  vancomycin (VANCOREADY) IVPB 500 mg/100 mL  Status:  Discontinued        500 mg 100 mL/hr over 60 Minutes Intravenous Every 48 hours 05/17/24 2115 05/17/24 2118   05/19/24 1200  vancomycin (VANCOREADY) IVPB 750 mg/150 mL  Status:  Discontinued        750 mg 150 mL/hr over 60 Minutes Intravenous Every 48 hours 05/17/24 2118 05/18/24 0854   05/18/24 1600  ceFEPIme (MAXIPIME) 2 g in sodium chloride  0.9 % 100 mL IVPB  Status:  Discontinued        2 g 200 mL/hr over 30 Minutes Intravenous Every 24 hours 05/17/24 2115 05/18/24 0854   05/18/24 1000  cefTRIAXone  (ROCEPHIN ) 1 g in sodium chloride  0.9 % 100 mL IVPB        1 g 200 mL/hr over 30 Minutes Intravenous Every 24 hours 05/18/24 0854 05/19/24 1046   05/18/24 0400  metroNIDAZOLE  (FLAGYL ) IVPB 500 mg  Status:  Discontinued        500 mg 100 mL/hr over 60 Minutes Intravenous Every 12 hours 05/17/24 2047 05/18/24 0854   05/17/24 2100  metroNIDAZOLE  (FLAGYL ) IVPB 500 mg  Status:  Discontinued        500 mg 100 mL/hr over 60 Minutes Intravenous Every 12 hours 05/17/24 2047 05/17/24 2047   05/17/24 1615  ceFEPIme (MAXIPIME) 2 g in sodium chloride  0.9 % 100 mL IVPB        2 g 200 mL/hr over 30 Minutes Intravenous  Once 05/17/24 1605 05/17/24 1649   05/17/24 1615  metroNIDAZOLE  (FLAGYL ) IVPB 500 mg        500 mg 100 mL/hr over 60 Minutes Intravenous  Once 05/17/24 1605 05/17/24 1727   05/17/24 1615   vancomycin (VANCOCIN) IVPB 1000 mg/200 mL premix        1,000 mg 200 mL/hr over 60 Minutes Intravenous  Once 05/17/24 1605 05/17/24 1726        MEDICATIONS: Scheduled Meds:  aspirin  EC  81 mg Oral Daily   carvedilol   25 mg Oral BID WC   cloNIDine   0.2 mg Transdermal Weekly   ezetimibe  10 mg Oral Daily   furosemide   40 mg Oral Daily   heparin   5,000 Units Subcutaneous Q8H   hydrALAZINE   50 mg Oral Q8H   insulin  aspart  0-9 Units Subcutaneous Q4H   melatonin  5 mg Oral QHS   pantoprazole   40 mg Oral Daily   technetium albumin aggregated  4.4 millicurie Intravenous Once   Continuous Infusions:   PRN Meds:.acetaminophen  **OR** acetaminophen , albuterol , ALPRAZolam , fentaNYL  (SUBLIMAZE ) injection, hydrALAZINE , HYDROcodone -acetaminophen ,  labetalol, nitroGLYCERIN , ondansetron  **OR** ondansetron  (ZOFRAN ) IV   I have personally reviewed following labs and imaging studies  LABORATORY DATA: CBC: Recent Labs  Lab 05/17/24 1408 05/18/24 0209 05/19/24 0614 05/20/24 0402  WBC 15.0* 11.5* 12.6* 14.5*  HGB 9.0* 8.6* 8.3* 8.3*  HCT 28.0* 28.0* 25.4* 25.8*  MCV 95.9 99.3 93.7 94.5  PLT 240 182 198 249    Basic Metabolic Panel: Recent Labs  Lab 05/17/24 1408 05/17/24 2044 05/18/24 0209 05/18/24 1020 05/19/24 0614 05/20/24 0402  NA 131* 138  --  133* 134* 134*  K 5.1 3.6  --  5.1 5.0 5.0  CL 103 113*  --  106 104 102  CO2 18* 17*  --  17* 18* 20*  GLUCOSE 204* 116*  --  251* 174* 129*  BUN 47* 36*  --  37* 51* 58*  CREATININE 2.62* 2.01*  --  2.37* 2.59* 2.86*  CALCIUM  8.1* 6.5*  --  8.2* 8.3* 8.4*  MG  --  1.2*  --   --  2.0  --   PHOS  --  2.5 3.4  --   --   --     GFR: Estimated Creatinine Clearance: 19.1 mL/min (A) (by C-G formula based on SCr of 2.86 mg/dL (H)).  Liver Function Tests: Recent Labs  Lab 05/17/24 1556  AST 14*  ALT 11  ALKPHOS 51  BILITOT 0.9  PROT 6.7  ALBUMIN 2.9*   Recent Labs  Lab 05/17/24 1556  LIPASE 18   No results for  input(s): AMMONIA in the last 168 hours.  Coagulation Profile: Recent Labs  Lab 05/17/24 2044  INR 1.6*    Cardiac Enzymes: Recent Labs  Lab 05/17/24 2044  CKTOTAL 39    BNP (last 3 results) No results for input(s): PROBNP in the last 8760 hours.  Lipid Profile: No results for input(s): CHOL, HDL, LDLCALC, TRIG, CHOLHDL, LDLDIRECT in the last 72 hours.  Thyroid  Function Tests: Recent Labs    05/17/24 2040 05/17/24 2044  TSH 0.791  --   FREET4  --  1.08    Anemia Panel: Recent Labs    05/17/24 2040 05/17/24 2044 05/18/24 0209  VITAMINB12  --   --  323  FOLATE  --   --  11.5  FERRITIN  --  52  --   TIBC  --  206*  --   IRON   --  12*  --   RETICCTPCT 2.1  --   --     Urine analysis:    Component Value Date/Time   COLORURINE YELLOW 05/17/2024 1759   APPEARANCEUR CLEAR 05/17/2024 1759   LABSPEC 1.011 05/17/2024 1759   PHURINE 5.0 05/17/2024 1759   GLUCOSEU NEGATIVE 05/17/2024 1759   HGBUR SMALL (A) 05/17/2024 1759   BILIRUBINUR NEGATIVE 05/17/2024 1759   KETONESUR NEGATIVE 05/17/2024 1759   PROTEINUR 100 (A) 05/17/2024 1759   UROBILINOGEN 0.2 04/20/2015 1418   NITRITE POSITIVE (A) 05/17/2024 1759   LEUKOCYTESUR TRACE (A) 05/17/2024 1759    Sepsis Labs: Lactic Acid, Venous    Component Value Date/Time   LATICACIDVEN 1.1 05/17/2024 2330    MICROBIOLOGY: Recent Results (from the past 240 hours)  Blood culture (routine x 2)     Status: None (Preliminary result)   Collection Time: 05/17/24  3:54 PM   Specimen: BLOOD LEFT ARM  Result Value Ref Range Status   Specimen Description BLOOD LEFT ARM  Final   Special Requests   Final    BOTTLES DRAWN AEROBIC  AND ANAEROBIC Blood Culture adequate volume   Culture   Final    NO GROWTH 3 DAYS Performed at [facility], [address], [place], [place] [address]    Report Status PENDING  Incomplete  Blood culture (routine x 2)     Status: None (Preliminary result)   Collection Time:  05/17/24  4:01 PM   Specimen: BLOOD  Result Value Ref Range Status   Specimen Description BLOOD SITE NOT SPECIFIED  Final   Special Requests   Final    BOTTLES DRAWN AEROBIC AND ANAEROBIC Blood Culture adequate volume   Culture   Final    NO GROWTH 3 DAYS Performed at [facility], [address], [place], [place] [address]    Report Status PENDING  Incomplete  Resp panel by RT-PCR (RSV, Flu A&B, Covid) Anterior Nasal Swab     Status: None   Collection Time: 05/17/24  7:19 PM   Specimen: Anterior Nasal Swab  Result Value Ref Range Status   SARS Coronavirus 2 by RT PCR NEGATIVE NEGATIVE Final   Influenza A by PCR NEGATIVE NEGATIVE Final   Influenza B by PCR NEGATIVE NEGATIVE Final    Comment: (NOTE) The Xpert Xpress SARS-CoV-2/FLU/RSV plus assay is intended as an aid in the diagnosis of influenza from Nasopharyngeal swab specimens and should not be used as a sole basis for treatment. Nasal washings and aspirates are unacceptable for Xpert Xpress SARS-CoV-2/FLU/RSV testing.  Fact Sheet for Patients: bloggercourse.com  Fact Sheet for Healthcare Providers: seriousbroker.it  This test is not yet approved or cleared by the [place]  FDA and has been authorized for detection and/or diagnosis of SARS-CoV-2 by FDA under an Emergency Use Authorization (EUA). This EUA will remain in effect (meaning this test can be used) for the duration of the COVID-19 declaration under Section 564(b)(1) of the Act, 21 U.S.C. section 360bbb-3(b)(1), unless the authorization is terminated or revoked.     Resp Syncytial Virus by PCR NEGATIVE NEGATIVE Final    Comment: (NOTE) Fact Sheet for Patients: bloggercourse.com  Fact Sheet for Healthcare Providers: seriousbroker.it  This test is not yet approved or cleared by the [place]  FDA and has been authorized for detection and/or diagnosis  of SARS-CoV-2 by FDA under an Emergency Use Authorization (EUA). This EUA will remain in effect (meaning this test can be used) for the duration of the COVID-19 declaration under Section 564(b)(1) of the Act, 21 U.S.C. section 360bbb-3(b)(1), unless the authorization is terminated or revoked.  Performed at [facility], [address], [place], [place] [address]   Respiratory (~20 pathogens) panel by PCR     Status: None   Collection Time: 05/17/24  7:19 PM   Specimen: Nasopharyngeal Swab; Respiratory  Result Value Ref Range Status   Adenovirus NOT DETECTED NOT DETECTED Final   Coronavirus 229E NOT DETECTED NOT DETECTED Final    Comment: (NOTE) The Coronavirus on the Respiratory Panel, DOES NOT test for the novel  Coronavirus (2019 nCoV)    Coronavirus HKU1 NOT DETECTED NOT DETECTED Final   Coronavirus NL63 NOT DETECTED NOT DETECTED Final   Coronavirus OC43 NOT DETECTED NOT DETECTED Final   Metapneumovirus NOT DETECTED NOT DETECTED Final   Rhinovirus / Enterovirus NOT DETECTED NOT DETECTED Final   Influenza A NOT DETECTED NOT DETECTED Final   Influenza B NOT DETECTED NOT DETECTED Final   Parainfluenza Virus 1 NOT DETECTED NOT DETECTED Final   Parainfluenza Virus 2 NOT DETECTED NOT DETECTED Final  Parainfluenza Virus 3 NOT DETECTED NOT DETECTED Final   Parainfluenza Virus 4 NOT DETECTED NOT DETECTED Final   Respiratory Syncytial Virus NOT DETECTED NOT DETECTED Final   Bordetella pertussis NOT DETECTED NOT DETECTED Final   Bordetella Parapertussis NOT DETECTED NOT DETECTED Final   Chlamydophila pneumoniae NOT DETECTED NOT DETECTED Final   Mycoplasma pneumoniae NOT DETECTED NOT DETECTED Final    Comment: Performed at [facility], [address], [place], [place] [address]    RADIOLOGY STUDIES/RESULTS: NM Pulmonary Perfusion Result Date: 05/18/2024 EXAM: NM Lung Perfusion Scan. CLINICAL HISTORY: Dyspnea on exertion (DOE). TECHNIQUE: Radiolabeled MAA was administered  intravenously and planar images of the lungs were obtained in multiple projections. RADIOPHARMACEUTICAL: MAA, 4.3 ml administered intravenously. COMPARISON: Chest radiograph 05/18/2024. FINDINGS: PERFUSION: No wedge-shaped peripheral perfusion defect within left or right lung to suggest acute pulmonary embolism. Normal perfusion pattern. Decreased regional attenuation in the lower lobes related to pleural effusions on radiograph. IMPRESSION: 1. No perfusion defects to indicate pulmonary embolism. 2. Decreased regional attenuation in the lower lobes related to pleural effusions on radiograph. Electronically signed by: Norleen Boxer MD 05/18/2024 03:08 PM EST RP Workstation: [id number]     LOS: 3 days   Donalda Applebaum, MD  [facility]    To contact the attending provider between 7A-7P or the covering provider during after hours 7P-7A, please log into the web site [email or web address] and access using universal [place] password for that web site. If you do not have the password, please call the hospital operator.  05/20/2024, 9:49 AM

## 2024-05-21 ENCOUNTER — Inpatient Hospital Stay (HOSPITAL_COMMUNITY)

## 2024-05-21 DIAGNOSIS — I214 Non-ST elevation (NSTEMI) myocardial infarction: Secondary | ICD-10-CM | POA: Diagnosis not present

## 2024-05-21 DIAGNOSIS — F419 Anxiety disorder, unspecified: Secondary | ICD-10-CM | POA: Diagnosis not present

## 2024-05-21 DIAGNOSIS — N184 Chronic kidney disease, stage 4 (severe): Secondary | ICD-10-CM | POA: Diagnosis not present

## 2024-05-21 DIAGNOSIS — N179 Acute kidney failure, unspecified: Secondary | ICD-10-CM | POA: Diagnosis not present

## 2024-05-21 LAB — BASIC METABOLIC PANEL WITH GFR
Anion gap: 10 (ref 5–15)
BUN: 66 mg/dL — ABNORMAL HIGH (ref 8–23)
CO2: 21 mmol/L — ABNORMAL LOW (ref 22–32)
Calcium: 8.3 mg/dL — ABNORMAL LOW (ref 8.9–10.3)
Chloride: 103 mmol/L (ref 98–111)
Creatinine, Ser: 3.12 mg/dL — ABNORMAL HIGH (ref 0.44–1.00)
GFR, Estimated: 16 mL/min — ABNORMAL LOW (ref >60)
Glucose, Bld: 125 mg/dL — ABNORMAL HIGH (ref 70–99)
Potassium: 4.8 mmol/L (ref 3.5–5.1)
Sodium: 134 mmol/L — ABNORMAL LOW (ref 135–145)

## 2024-05-21 LAB — GLUCOSE, CAPILLARY
Glucose-Capillary: 113 mg/dL — ABNORMAL HIGH (ref 70–99)
Glucose-Capillary: 115 mg/dL — ABNORMAL HIGH (ref 70–99)
Glucose-Capillary: 131 mg/dL — ABNORMAL HIGH (ref 70–99)
Glucose-Capillary: 132 mg/dL — ABNORMAL HIGH (ref 70–99)
Glucose-Capillary: 174 mg/dL — ABNORMAL HIGH (ref 70–99)
Glucose-Capillary: 177 mg/dL — ABNORMAL HIGH (ref 70–99)
Glucose-Capillary: 181 mg/dL — ABNORMAL HIGH (ref 70–99)

## 2024-05-21 LAB — LEGIONELLA PNEUMOPHILA SEROGP 1 UR AG: L. pneumophila Serogp 1 Ur Ag: NEGATIVE

## 2024-05-21 MED ORDER — FLEET ENEMA RE ENEM: 1.0000 | ENEMA | Freq: Every day | RECTAL | Status: DC | PRN

## 2024-05-21 MED ORDER — FUROSEMIDE 10 MG/ML IJ SOLN
60.0000 mg | Freq: Three times a day (TID) | INTRAMUSCULAR | Status: DC
  Administered 2024-05-21 – 2024-05-22 (×2): 60 mg via INTRAVENOUS
  Filled 2024-05-21 (×2): qty 6

## 2024-05-21 MED ORDER — POLYETHYLENE GLYCOL 3350 17 G PO PACK
17.0000 g | PACK | Freq: Once | ORAL | Status: AC
  Administered 2024-05-21: 17 g via ORAL
  Filled 2024-05-21: qty 1

## 2024-05-21 NOTE — Progress Notes (Signed)
 PROGRESS NOTE        PATIENT DETAILS Name: Shelly [name] Age: 68 y.o. Sex: female Date of Birth: Jul 09, 1956 Admit Date: 05/17/2024 Admitting Physician Blease Quiver, MD ERE:Dyjt, Joen, MD  Brief Summary: Patient is a 68 y.o.  female with history of chronic HFpEF, HTN, HLD, DM-2-who presented with worsening shortness of breath-patient was found to have HFpEF exacerbation/non-STEMI/hypoxemia-and subsequently admitted to the hospitalist service.  Significant events: 11/5>> admit to [facility]  Significant studies: 10/22>> echo: EF 55%, mild to moderate mitral valve regurgitation 11/5>> CXR: New small right pleural effusion/small left pleural fluid.  Mild vascular congestion. 11/5>> CT abdomen/pelvis: Nodular consolidation in the lingula-atelectasis or focus of pneumonia.  Moderate nonspecific perinephric stranding. 11/6>> VQ scan: No perfusion defects. 11/9>> renal ultrasound: No hydronephrosis  Significant microbiology data: 11/5>> COVID/influenza/RSV PCR: Negative 11/5>> respiratory virus panel: Negative 11/5>> blood culture: No growth  Procedures: None  Consults: Cardiology  Subjective: No major issues overnight-still gets somewhat short of breath when she walks.  On room air at rest.  Objective: Vitals: Blood pressure (!) 164/58, pulse 71, temperature 97.6 F (36.4 C), temperature source Oral, resp. rate (!) 21, height 5' 2 (1.575 m), weight 85.3 kg, SpO2 96%.   Exam: Gen Exam:Alert awake-not in any distress HEENT:atraumatic, normocephalic Chest: B/L clear to auscultation anteriorly CVS:S1S2 regular Abdomen:soft non tender, non distended Extremities:no edema Neurology: Non focal Skin: no rash  Pertinent Labs/Radiology:    Latest Ref Rng & Units 05/20/2024    4:02 AM 05/19/2024    6:14 AM 05/18/2024    2:09 AM  CBC  WBC 4.0 - 10.5 K/uL 14.5  12.6  11.5   Hemoglobin 12.0 - 15.0 g/dL 8.3  8.3  8.6   Hematocrit 36.0 - 46.0 % 25.8   25.4  28.0   Platelets 150 - 400 K/uL 249  198  182     Lab Results  Component Value Date   NA 134 (L) 05/21/2024   K 4.8 05/21/2024   CL 103 05/21/2024   CO2 21 (L) 05/21/2024      Assessment/Plan: Non-STEMI No further chest pain Respiratory status much better No longer on IV heparin  Due to renal function-LHC deferred-stress test ordered for 11/10. Continue continue aspirin /beta-blocker-intolerant to statin. Cardiology following.  Acute on chronic HFpEF Euvolemic on exam Overall better-no shortness of breath at rest but still gets some amount of exertional dyspnea On oral diuretics Continue to monitor electrolytes/intake/output/daily weights.  Acute hypoxic respiratory failure Secondary to HFpEF exacerbation On room air at rest but per nursing staff-requires a little bit of oxygen when she walks May need ambulatory O2 saturation prior to discharge.  HTN BP relatively stable Continue Coreg /hydralazine /transdermal clonidine   AKI on CKD stage 4 Creatinine unfortunately continues to uptrend-suspicion that this is secondary to better blood blood pressure control-diuretic use. UA with minimal proteinuria-unchanged from previous Renal ultrasound negative for hydronephrosis Difficult situation-needs diuretics-better BP control-will get nephrology opinion in regards to medication management/further workup.    Normocytic anemia Suspect this is from underlying CKD Follow CBC and transfuse if significant drop  DM-2 (A1c 6.9 on 11/5) CBG stable SSI  Recent Labs    05/21/24 0044 05/21/24 0420 05/21/24 0917  GLUCAP 113* 115* 177*    HLD Intolerant to statin per allergy list Continue Zetia  GERD PPI  Asymptomatic bacteriuria No clinical features consistent with UTI CT does  show some perinephric stranding but nonspecific-with no corresponding clinical features. No longer on Rocephin   Nodular consolidation in lingula Incidental finding on CT chest No clinical  symptoms suggestive of PNA Doubt requires antibiotics Needs repeat CT imaging in a few months to document resolution of this lesion.  Anxiety disorder As needed Xanax   Class 2 Obesity: Estimated body mass index is 34.39 kg/m as calculated from the following:   Height as of this encounter: 5' 2 (1.575 m).   Weight as of this encounter: 85.3 kg.   Code status:   Code Status: Full Code   DVT Prophylaxis:SQ Heparin    Family Communication: None at bedside   Disposition Plan: Status is: Inpatient Remains inpatient appropriate because: Severity of illness   Planned Discharge Destination:Home likely on 11/9   Diet: Diet Order             Diet heart healthy/carb modified Room service appropriate? Yes; Fluid consistency: Thin  Diet effective now                     Antimicrobial agents: Anti-infectives (From admission, onward)    Start     Dose/Rate Route Frequency Ordered Stop   05/19/24 1200  vancomycin (VANCOREADY) IVPB 500 mg/100 mL  Status:  Discontinued        500 mg 100 mL/hr over 60 Minutes Intravenous Every 48 hours 05/17/24 2115 05/17/24 2118   05/19/24 1200  vancomycin (VANCOREADY) IVPB 750 mg/150 mL  Status:  Discontinued        750 mg 150 mL/hr over 60 Minutes Intravenous Every 48 hours 05/17/24 2118 05/18/24 0854   05/18/24 1600  ceFEPIme (MAXIPIME) 2 g in sodium chloride  0.9 % 100 mL IVPB  Status:  Discontinued        2 g 200 mL/hr over 30 Minutes Intravenous Every 24 hours 05/17/24 2115 05/18/24 0854   05/18/24 1000  cefTRIAXone  (ROCEPHIN ) 1 g in sodium chloride  0.9 % 100 mL IVPB        1 g 200 mL/hr over 30 Minutes Intravenous Every 24 hours 05/18/24 0854 05/19/24 1046   05/18/24 0400  metroNIDAZOLE  (FLAGYL ) IVPB 500 mg  Status:  Discontinued        500 mg 100 mL/hr over 60 Minutes Intravenous Every 12 hours 05/17/24 2047 05/18/24 0854   05/17/24 2100  metroNIDAZOLE  (FLAGYL ) IVPB 500 mg  Status:  Discontinued        500 mg 100 mL/hr over 60  Minutes Intravenous Every 12 hours 05/17/24 2047 05/17/24 2047   05/17/24 1615  ceFEPIme (MAXIPIME) 2 g in sodium chloride  0.9 % 100 mL IVPB        2 g 200 mL/hr over 30 Minutes Intravenous  Once 05/17/24 1605 05/17/24 1649   05/17/24 1615  metroNIDAZOLE  (FLAGYL ) IVPB 500 mg        500 mg 100 mL/hr over 60 Minutes Intravenous  Once 05/17/24 1605 05/17/24 1727   05/17/24 1615  vancomycin (VANCOCIN) IVPB 1000 mg/200 mL premix        1,000 mg 200 mL/hr over 60 Minutes Intravenous  Once 05/17/24 1605 05/17/24 1726        MEDICATIONS: Scheduled Meds:  aspirin  EC  81 mg Oral Daily   carvedilol   25 mg Oral BID WC   cloNIDine   0.2 mg Transdermal Weekly   ezetimibe  10 mg Oral Daily   furosemide   40 mg Oral Daily   heparin   5,000 Units Subcutaneous Q8H   hydrALAZINE   50  mg Oral Q8H   insulin  aspart  0-9 Units Subcutaneous Q4H   melatonin  5 mg Oral QHS   pantoprazole   40 mg Oral Daily   polyethylene glycol  17 g Oral Daily   senna-docusate  2 tablet Oral QHS   Continuous Infusions:   PRN Meds:.acetaminophen  **OR** acetaminophen , albuterol , ALPRAZolam , bisacodyl , fentaNYL  (SUBLIMAZE ) injection, hydrALAZINE , HYDROcodone -acetaminophen , labetalol, nitroGLYCERIN , ondansetron  **OR** ondansetron  (ZOFRAN ) IV   I have personally reviewed following labs and imaging studies  LABORATORY DATA: CBC: Recent Labs  Lab 05/17/24 1408 05/18/24 0209 05/19/24 0614 05/20/24 0402  WBC 15.0* 11.5* 12.6* 14.5*  HGB 9.0* 8.6* 8.3* 8.3*  HCT 28.0* 28.0* 25.4* 25.8*  MCV 95.9 99.3 93.7 94.5  PLT 240 182 198 249    Basic Metabolic Panel: Recent Labs  Lab 05/17/24 2044 05/18/24 0209 05/18/24 1020 05/19/24 0614 05/20/24 0402 05/21/24 0426  NA 138  --  133* 134* 134* 134*  K 3.6  --  5.1 5.0 5.0 4.8  CL 113*  --  106 104 102 103  CO2 17*  --  17* 18* 20* 21*  GLUCOSE 116*  --  251* 174* 129* 125*  BUN 36*  --  37* 51* 58* 66*  CREATININE 2.01*  --  2.37* 2.59* 2.86* 3.12*  CALCIUM  6.5*   --  8.2* 8.3* 8.4* 8.3*  MG 1.2*  --   --  2.0  --   --   PHOS 2.5 3.4  --   --   --   --     GFR: Estimated Creatinine Clearance: 17.5 mL/min (A) (by C-G formula based on SCr of 3.12 mg/dL (H)).  Liver Function Tests: Recent Labs  Lab 05/17/24 1556  AST 14*  ALT 11  ALKPHOS 51  BILITOT 0.9  PROT 6.7  ALBUMIN 2.9*   Recent Labs  Lab 05/17/24 1556  LIPASE 18   No results for input(s): AMMONIA in the last 168 hours.  Coagulation Profile: Recent Labs  Lab 05/17/24 2044  INR 1.6*    Cardiac Enzymes: Recent Labs  Lab 05/17/24 2044  CKTOTAL 39    BNP (last 3 results) No results for input(s): PROBNP in the last 8760 hours.  Lipid Profile: No results for input(s): CHOL, HDL, LDLCALC, TRIG, CHOLHDL, LDLDIRECT in the last 72 hours.  Thyroid  Function Tests: No results for input(s): TSH, T4TOTAL, FREET4, T3FREE, THYROIDAB in the last 72 hours.   Anemia Panel: No results for input(s): VITAMINB12, FOLATE, FERRITIN, TIBC, IRON , RETICCTPCT in the last 72 hours.   Urine analysis:    Component Value Date/Time   COLORURINE YELLOW 05/17/2024 1759   APPEARANCEUR CLEAR 05/17/2024 1759   LABSPEC 1.011 05/17/2024 1759   PHURINE 5.0 05/17/2024 1759   GLUCOSEU NEGATIVE 05/17/2024 1759   HGBUR SMALL (A) 05/17/2024 1759   BILIRUBINUR NEGATIVE 05/17/2024 1759   KETONESUR NEGATIVE 05/17/2024 1759   PROTEINUR 100 (A) 05/17/2024 1759   UROBILINOGEN 0.2 04/20/2015 1418   NITRITE POSITIVE (A) 05/17/2024 1759   LEUKOCYTESUR TRACE (A) 05/17/2024 1759    Sepsis Labs: Lactic Acid, Venous    Component Value Date/Time   LATICACIDVEN 1.1 05/17/2024 2330    MICROBIOLOGY: Recent Results (from the past 240 hours)  Blood culture (routine x 2)     Status: None (Preliminary result)   Collection Time: 05/17/24  3:54 PM   Specimen: BLOOD LEFT ARM  Result Value Ref Range Status   Specimen Description BLOOD LEFT ARM  Final   Special Requests    Final  BOTTLES DRAWN AEROBIC AND ANAEROBIC Blood Culture adequate volume   Culture   Final    NO GROWTH 4 DAYS Performed at [facility], [address] [place], [place] [address]    Report Status PENDING  Incomplete  Blood culture (routine x 2)     Status: None (Preliminary result)   Collection Time: 05/17/24  4:01 PM   Specimen: BLOOD  Result Value Ref Range Status   Specimen Description BLOOD SITE NOT SPECIFIED  Final   Special Requests   Final    BOTTLES DRAWN AEROBIC AND ANAEROBIC Blood Culture adequate volume   Culture   Final    NO GROWTH 4 DAYS Performed at [facility], [address], [place], [place] [address]    Report Status PENDING  Incomplete  Resp panel by RT-PCR (RSV, Flu A&B, Covid) Anterior Nasal Swab     Status: None   Collection Time: 05/17/24  7:19 PM   Specimen: Anterior Nasal Swab  Result Value Ref Range Status   SARS Coronavirus 2 by RT PCR NEGATIVE NEGATIVE Final   Influenza A by PCR NEGATIVE NEGATIVE Final   Influenza B by PCR NEGATIVE NEGATIVE Final    Comment: (NOTE) The Xpert Xpress SARS-CoV-2/FLU/RSV plus assay is intended as an aid in the diagnosis of influenza from Nasopharyngeal swab specimens and should not be used as a sole basis for treatment. Nasal washings and aspirates are unacceptable for Xpert Xpress SARS-CoV-2/FLU/RSV testing.  Fact Sheet for Patients: bloggercourse.com  Fact Sheet for Healthcare Providers: seriousbroker.it  This test is not yet approved or cleared by the [place]  FDA and has been authorized for detection and/or diagnosis of SARS-CoV-2 by FDA under an Emergency Use Authorization (EUA). This EUA will remain in effect (meaning this test can be used) for the duration of the COVID-19 declaration under Section 564(b)(1) of the Act, 21 U.S.C. section 360bbb-3(b)(1), unless the authorization is terminated or revoked.     Resp Syncytial Virus by PCR  NEGATIVE NEGATIVE Final    Comment: (NOTE) Fact Sheet for Patients: bloggercourse.com  Fact Sheet for Healthcare Providers: seriousbroker.it  This test is not yet approved or cleared by the [place]  FDA and has been authorized for detection and/or diagnosis of SARS-CoV-2 by FDA under an Emergency Use Authorization (EUA). This EUA will remain in effect (meaning this test can be used) for the duration of the COVID-19 declaration under Section 564(b)(1) of the Act, 21 U.S.C. section 360bbb-3(b)(1), unless the authorization is terminated or revoked.  Performed at [facility], [address], [place], [place] [address]   Respiratory (~20 pathogens) panel by PCR     Status: None   Collection Time: 05/17/24  7:19 PM   Specimen: Nasopharyngeal Swab; Respiratory  Result Value Ref Range Status   Adenovirus NOT DETECTED NOT DETECTED Final   Coronavirus 229E NOT DETECTED NOT DETECTED Final    Comment: (NOTE) The Coronavirus on the Respiratory Panel, DOES NOT test for the novel  Coronavirus (2019 nCoV)    Coronavirus HKU1 NOT DETECTED NOT DETECTED Final   Coronavirus NL63 NOT DETECTED NOT DETECTED Final   Coronavirus OC43 NOT DETECTED NOT DETECTED Final   Metapneumovirus NOT DETECTED NOT DETECTED Final   Rhinovirus / Enterovirus NOT DETECTED NOT DETECTED Final   Influenza A NOT DETECTED NOT DETECTED Final   Influenza B NOT DETECTED NOT DETECTED Final   Parainfluenza Virus 1 NOT DETECTED NOT DETECTED Final   Parainfluenza Virus 2 NOT DETECTED NOT DETECTED  Final   Parainfluenza Virus 3 NOT DETECTED NOT DETECTED Final   Parainfluenza Virus 4 NOT DETECTED NOT DETECTED Final   Respiratory Syncytial Virus NOT DETECTED NOT DETECTED Final   Bordetella pertussis NOT DETECTED NOT DETECTED Final   Bordetella Parapertussis NOT DETECTED NOT DETECTED Final   Chlamydophila pneumoniae NOT DETECTED NOT DETECTED Final   Mycoplasma pneumoniae  NOT DETECTED NOT DETECTED Final    Comment: Performed at [facility], [address], [place], [place] [address]  Urine Culture (for pregnant, neutropenic or urologic patients or patients with an indwelling urinary catheter)     Status: Abnormal   Collection Time: 05/18/24  8:51 AM   Specimen: Urine, Clean Catch  Result Value Ref Range Status   Specimen Description URINE, CLEAN CATCH  Final   Special Requests   Final    NONE Performed at [facility], [address], [place], [place] [address]    Culture MULTIPLE SPECIES PRESENT, SUGGEST RECOLLECTION (A)  Final   Report Status 05/20/2024 FINAL  Final    RADIOLOGY STUDIES/RESULTS: US  RENAL Result Date: 05/21/2024 CLINICAL DATA:  [id number] AKI (acute kidney injury) [id number] EXAM: RENAL / URINARY TRACT ULTRASOUND COMPLETE COMPARISON:  May 17, 2024 FINDINGS: Right Kidney: Renal measurements: 9.6 x 4.2 x 3.5 cm = volume: 75 mL. Echogenicity is mildly increased. No mass or hydronephrosis visualized. Cortical thinning of the superior pole. Left Kidney: Renal measurements: 10.2 x 5.1 x 4.7 cm = volume: 129 mL. Echogenicity within normal limits. No mass or hydronephrosis visualized. Bladder: Appears normal for degree of bladder distention. Other: Bilateral pleural effusions. IMPRESSION: 1. No hydronephrosis. 2. Bilateral pleural effusions. Electronically Signed   By: Corean Salter M.D.   On: 05/21/2024 09:38     LOS: 4 days   Donalda Applebaum, MD  [facility]    To contact the attending provider between 7A-7P or the covering provider during after hours 7P-7A, please log into the web site [email or web address] and access using universal Jay password for that web site. If you do not have the password, please call the hospital operator.  05/21/2024, 9:51 AM

## 2024-05-21 NOTE — Consult Note (Addendum)
 Renal Service Consult Note [facility] Lamar JONETTA Fret, MD  Patient: Shelly [name] Date: 05/21/2024 Requesting Physician: Dr. Raenelle  Reason for Consult: Renal failure  HPI: The patient is a 68 y.o. year-old w/ PMH as below who presented on 05/17/2024 with fever increased abdominal girth, chest pain and shortness of breath.  She was found to be hypoxic on room air with sats in the low 80s.  Temps were 100.4.  Patient was admitted and started on IV antibiotics.  She received some rehydration fluids.  Cardiology was consulted.  Chest x-ray showed vascular congestion.  Patient received IV Lasix  1 dose 60 mg on 11/06.  Next day patient felt much better (urine output was not recorded).  Creatinine bumped up some so IV Lasix  was held and p.o. Lasix  started at 40 mg a day.  Subsequently patient has not really felt much better in terms of having nausea and DOE.  We are asked to see for renal failure and diuresis.   Pt seen and hospital room.  Main complaint is dyspnea on exertion even going up to the bathroom.  Feels like she needs an inhaler.  No other real complaints.   ROS - denies CP, no joint pain, no HA, no blurry vision, no rash, no diarrhea   Past Medical History  Past Medical History:  Diagnosis Date   Acid reflux    Anemia    Anxiety    Arthritis    Asthma    infrequent problems   Cardiomyopathy (HCC)    CHF (congestive heart failure) (HCC)    Chronic UTI (urinary tract infection)    Complication of anesthesia    I quit breathing during surgery 2008 - has had surgery since with no problems    Diabetes mellitus    Endometriosis    hx of    Headache    hx of migraines   Hepatitis B    hx of dx during ruptured appendix hospitalization   Hyperlipemia    Hypertension    IBS (irritable bowel syndrome)    Irritable bowel syndrome    Kidney disease    Obesity    Pneumonia 12/2013   Pneumonia    PONV (postoperative nausea and vomiting)    Rash    sides of  neck   Stress incontinence    Tuberculosis 1985   Past Surgical History  Past Surgical History:  Procedure Laterality Date   ABDOMINAL ADHESION SURGERY  1989   x 7   ABDOMINAL HYSTERECTOMY  01/1996   APPENDECTOMY  1987   BREAST BIOPSY Right 05/18/2022   MM RT BREAST BX W LOC DEV 1ST LESION IMAGE BX SPEC STEREO GUIDE 05/18/2022 [facility]   CHOLECYSTECTOMY  11/24/2000   COLON SURGERY  2010   colon resection due to adhesions   LAPAROSCOPIC LYSIS OF ADHESIONS  07/1991   pelvic adhesions   LEFT AND RIGHT HEART CATHETERIZATION WITH CORONARY ANGIOGRAM N/A 06/29/2014   Procedure: LEFT AND RIGHT HEART CATHETERIZATION WITH CORONARY ANGIOGRAM;  Surgeon: Candyce GORMAN Reek, MD;  Location: [facility] CATH LAB;  Service: Cardiovascular;  Laterality: N/A;   OOPHORECTOMY Right 04/1995   OOPHORECTOMY Left 12/1992   removed left ovary   PARTIAL KNEE ARTHROPLASTY Left 10/27/2013   Procedure: LEFT KNEE UNICOMPARTMENTAL REPLACEMENT ;  Surgeon: Dempsey LULLA Moan, MD;  Location: [facility];  Service: Orthopedics;  Laterality: Left;   PARTIAL KNEE ARTHROPLASTY Right 05/21/2014   Procedure: MEDIAL UNICOMPARTMENTAL RIGHT KNEE ARTHROPLASTY;  Surgeon: Dempsey Moan LULLA, MD;  Location: [facility];  Service: Orthopedics;  Laterality: Right;   TONSILLECTOMY     Family History  Family History  Problem Relation Age of Onset   Hypertension Mother    Heart failure Mother    Stroke Mother    Heart disease Father    Diabetes Father    Liver cancer Brother    Heart attack Maternal Grandmother    Hypertension Maternal Grandmother    Heart attack Paternal Grandmother    Breast cancer Neg Hx    Social History  reports that she has never smoked. She has never used smokeless tobacco. She reports current alcohol use. She reports that she does not use drugs. Allergies  Allergies  Allergen Reactions   Singulair  [Montelukast  Sodium] Other (See Comments)    Vivid dreams   Amlodipine  Besylate     Other reaction(s): fatgiue  and nausea   Atorvastatin     Other reaction(s): strange feelings   Lisinopril Cough   Metformin Diarrhea   Penicillins Other (See Comments)    Other reaction(s): rash Unknown reaction.    Sulfa Antibiotics Nausea And Vomiting   Welchol  [Colesevelam Hcl]     Other reaction(s): constipation   Dilaudid  [Hydromorphone  Hcl] Anxiety and Other (See Comments)    paranoia   Sulfamethoxazole-Trimethoprim Rash   Home medications Prior to Admission medications   Medication Sig Start Date End Date Taking? Authorizing Provider  albuterol  (PROVENTIL ) (2.5 MG/3ML) 0.083% nebulizer solution Take 3 mLs (2.5 mg total) by nebulization every 6 (six) hours as needed for wheezing or shortness of breath. 07/26/19  Yes Meade Verdon RAMAN, MD  ALPRAZolam  (XANAX ) 0.5 MG tablet Take 0.5 mg by mouth 3 (three) times daily as needed. Anxiety   Yes [provider]  carvedilol  (COREG ) 25 MG tablet Take 25 mg by mouth 2 (two) times daily with a meal.   Yes [provider]  cephALEXin (KEFLEX) 250 MG capsule Take 250 mg by mouth 2 (two) times daily. 05/01/24  Yes [provider]  cloNIDine  (CATAPRES  - DOSED IN MG/24 HR) 0.2 mg/24hr patch Place 0.2 mg onto the skin once a week.   Yes [provider]  cloNIDine  (CATAPRES ) 0.1 MG tablet Take 1 tablet (0.1 mg total) by mouth 2 (two) times daily. Patient taking differently: Take 0.2 mg by mouth at bedtime. 04/03/24  Yes Raford Riggs, MD  COMBIVENT RESPIMAT 20-100 MCG/ACT AERS respimat Take 1 puff by mouth every 6 (six) hours as needed for shortness of breath. 07/23/16  Yes [provider]  doxazosin  (CARDURA ) 2 MG tablet Take 1 tablet (2 mg total) by mouth daily. Patient taking differently: Take 4 mg by mouth 2 (two) times daily. 04/03/24  Yes Raford Riggs, MD  estradiol (ESTRACE) 0.1 MG/GM vaginal cream Place 1 Applicatorful vaginally 2 (two) times a week. 01/12/24  Yes [provider]  ezetimibe (ZETIA) 10 MG tablet  Take 10 mg by mouth daily.   Yes [provider]  famotidine  (PEPCID ) 40 MG tablet Take 40 mg by mouth at bedtime.   Yes [provider]  fluticasone  (FLONASE ) 50 MCG/ACT nasal spray Place 1 spray into both nostrils daily as needed. For allergy nasal congestion 05/31/14  Yes [provider]  hydrALAZINE  (APRESOLINE ) 25 MG tablet Take 25 mg by mouth 3 (three) times daily.   Yes [provider]  HYDROcodone -acetaminophen  (NORCO/VICODIN) 5-325 MG tablet Take 0.5 tablets by mouth every 6 (six) hours as needed for moderate pain (pain score 4-6).   Yes [provider]  melatonin 5 MG TABS Take 5 mg by mouth at bedtime.   Yes [provider]  omeprazole  (PRILOSEC ) 40 MG capsule Take 40 mg by mouth at bedtime. 05/05/24  Yes [provider]  pantoprazole  (PROTONIX ) 40 MG tablet Take 40 mg by mouth daily.   Yes [provider]  promethazine  (PHENERGAN ) 25 MG tablet Take 1 tablet (25 mg total) by mouth daily as needed. Patient taking differently: Take 25 mg by mouth daily as needed for refractory nausea / vomiting. 09/20/16  Yes Mesner, Selinda, MD     Vitals:   05/20/24 2114 05/21/24 0052 05/21/24 0404 05/21/24 1133  BP: (!) 172/59 (!) 163/57 (!) 164/58 (!) 153/54  Pulse: 70 72 71 72  Resp: 14 14 (!) 21 14  Temp: 97.7 F (36.5 C) (!) 97.5 F (36.4 C) 97.6 F (36.4 C) 97.7 F (36.5 C)  TempSrc: Oral Oral Oral Oral  SpO2: 92% 94% 96%   Weight:      Height:       Exam Gen alert, no distress, 2L [place] O2 Sclera anicteric, throat clear  No jvd or bruits Chest bilat crackles at bases 1/4 up RRR no MRG Abd soft ntnd no mass or ascites +bs Ext no sig pitting LE or UE edema, no other edema Neuro is alert, Ox 3 , nf   Home bp meds: Coreg  25 bid Catapres  patch 0.2mg  weekly Clonidine  0.2mg  at bedtime  Cardura  4mg  bid Hydralazine  25mg  tid   Date   Creat  eGFR (ml/min) 2008   0.84- 1.01 2013   3.50 >> 1.11 AKI April  2015  1.0- 1.23 May 2014  0.93- 1.37 Dec 2015  1.02- 1.68 2016- 2018  1.20- 1.44  2019   1.17 03/06/24  2.12- 2.19 24-25 ml/min  05/17/24  2.0, 2.6 20-27 ml/min  11/06   2.37  22 ml/min 11/07   2.59 11/08   2.86  17 ml/min 05/21/24  3.12  16 ml/min     BP's: stable in the 150/ 55 range now, higher on admission 180/68 range I/O: 2.7 L in and no UOP recorded, not accurate Wts: 85.3 kg , no other wts (from 11/05)  UA 11/05: small LE, rare bact, 0-5 rbc, 6-10 wbc, 0-5 epis, protein 100 UNa: 34, UCr: 82   Renal US : 9/6/ 10.2 cm kidneys w/ no hydronephrosis CXR: 11/06 -> vasc congestion, early mild IS edema Labs today --> Na 134, K 4.8, CL 103, CO2 21, BUN 66, creat 3.12, Ca 8.3     Assessment/ Plan: AKI on CKD 4: b/l creatinine is 2.2 from aug 2025, eGFR 25 ml/min. Creat here was 2.0 on admission in the setting of worsening SOB, vasc congestion on CXR. Also NSTEMI. Also had possible infection (pna possibly) and is getting IV rocephin . Renal US  w/o obstruction, UA unremarkable. She was given IV lasix  initially 60mg  x 1, then 40mg  of po lasix  every day since. Creat is up to 3.1 today - pt's still having sig DOE, and +rales on exam. Will try IV lasix  60mg  every 8 hrs. Record UOP. No indication for RRT at this time. Will follow.  Anemia of CKD: Hb 8-10 range here, follow, transfuse prn.  Volume: as above HTN: Patient states that she started out with mild hypertension, then it took off and her kidney doctor, Dr. Norine from [facility], did extensive workup in the outpatient setting including scans and other testing to rule out other causes.  She says she ended up on  multiple blood pressure medications when she had started out on just 2.  Looks like blood pressures here are under good control now in the 130- 150/ 50-70 range.    Myer Fret  MD [facility] 05/21/2024, 4:54 PM  Recent Labs  Lab 05/20/24 0402 05/21/24 0426  CREATININE 2.86* 3.12*  K 5.0 4.8   Inpatient medications:  aspirin  EC  81 mg  Oral Daily   carvedilol   25 mg Oral BID WC   cloNIDine   0.2 mg Transdermal Weekly   ezetimibe  10 mg Oral Daily   furosemide   40 mg Oral Daily   heparin   5,000 Units Subcutaneous Q8H   hydrALAZINE   50 mg Oral Q8H   insulin  aspart  0-9 Units Subcutaneous Q4H   melatonin  5 mg Oral QHS   pantoprazole   40 mg Oral Daily   polyethylene glycol  17 g Oral Daily   senna-docusate  2 tablet Oral QHS    acetaminophen  **OR** acetaminophen , albuterol , ALPRAZolam , bisacodyl , fentaNYL  (SUBLIMAZE ) injection, hydrALAZINE , HYDROcodone -acetaminophen , labetalol, nitroGLYCERIN , ondansetron  **OR** ondansetron  (ZOFRAN ) IV

## 2024-05-21 NOTE — Plan of Care (Signed)
  Problem: Fluid Volume: Goal: Hemodynamic stability will improve Outcome: Progressing   Problem: Clinical Measurements: Goal: Diagnostic test results will improve Outcome: Progressing Goal: Signs and symptoms of infection will decrease Outcome: Progressing   Problem: Respiratory: Goal: Ability to maintain adequate ventilation will improve Outcome: Progressing   Problem: Education: Goal: Ability to describe self-care measures that may prevent or decrease complications (Diabetes Survival Skills Education) will improve Outcome: Progressing Goal: Individualized Educational Video(s) Outcome: Progressing   Problem: Coping: Goal: Ability to adjust to condition or change in health will improve Outcome: Progressing   Problem: Fluid Volume: Goal: Ability to maintain a balanced intake and output will improve Outcome: Progressing   Problem: Health Behavior/Discharge Planning: Goal: Ability to identify and utilize available resources and services will improve Outcome: Progressing Goal: Ability to manage health-related needs will improve Outcome: Progressing   Problem: Metabolic: Goal: Ability to maintain appropriate glucose levels will improve Outcome: Progressing   Problem: Nutritional: Goal: Maintenance of adequate nutrition will improve Outcome: Progressing Goal: Progress toward achieving an optimal weight will improve Outcome: Progressing   Problem: Skin Integrity: Goal: Risk for impaired skin integrity will decrease Outcome: Progressing   Problem: Tissue Perfusion: Goal: Adequacy of tissue perfusion will improve Outcome: Progressing   Problem: Activity: Goal: Ability to tolerate increased activity will improve Outcome: Progressing   Problem: Clinical Measurements: Goal: Ability to maintain a body temperature in the normal range will improve Outcome: Progressing   Problem: Respiratory: Goal: Ability to maintain adequate ventilation will improve Outcome:  Progressing Goal: Ability to maintain a clear airway will improve Outcome: Progressing   Problem: Education: Goal: Knowledge of General Education information will improve Description: Including pain rating scale, medication(s)/side effects and non-pharmacologic comfort measures Outcome: Progressing   Problem: Health Behavior/Discharge Planning: Goal: Ability to manage health-related needs will improve Outcome: Progressing   Problem: Clinical Measurements: Goal: Ability to maintain clinical measurements within normal limits will improve Outcome: Progressing Goal: Will remain free from infection Outcome: Progressing Goal: Diagnostic test results will improve Outcome: Progressing Goal: Respiratory complications will improve Outcome: Progressing Goal: Cardiovascular complication will be avoided Outcome: Progressing   Problem: Activity: Goal: Risk for activity intolerance will decrease Outcome: Progressing   Problem: Nutrition: Goal: Adequate nutrition will be maintained Outcome: Progressing   Problem: Coping: Goal: Level of anxiety will decrease Outcome: Progressing   Problem: Elimination: Goal: Will not experience complications related to bowel motility Outcome: Progressing Goal: Will not experience complications related to urinary retention Outcome: Progressing   Problem: Pain Managment: Goal: General experience of comfort will improve and/or be controlled Outcome: Progressing   Problem: Safety: Goal: Ability to remain free from injury will improve Outcome: Progressing   Problem: Skin Integrity: Goal: Risk for impaired skin integrity will decrease Outcome: Progressing

## 2024-05-22 DIAGNOSIS — N184 Chronic kidney disease, stage 4 (severe): Secondary | ICD-10-CM | POA: Diagnosis not present

## 2024-05-22 DIAGNOSIS — F419 Anxiety disorder, unspecified: Secondary | ICD-10-CM | POA: Diagnosis not present

## 2024-05-22 DIAGNOSIS — I214 Non-ST elevation (NSTEMI) myocardial infarction: Secondary | ICD-10-CM | POA: Diagnosis not present

## 2024-05-22 DIAGNOSIS — N179 Acute kidney failure, unspecified: Secondary | ICD-10-CM | POA: Diagnosis not present

## 2024-05-22 LAB — BASIC METABOLIC PANEL WITH GFR
Anion gap: 7 (ref 5–15)
BUN: 65 mg/dL — ABNORMAL HIGH (ref 8–23)
CO2: 26 mmol/L (ref 22–32)
Calcium: 8.4 mg/dL — ABNORMAL LOW (ref 8.9–10.3)
Chloride: 102 mmol/L (ref 98–111)
Creatinine, Ser: 2.71 mg/dL — ABNORMAL HIGH (ref 0.44–1.00)
GFR, Estimated: 19 mL/min — ABNORMAL LOW (ref >60)
Glucose, Bld: 136 mg/dL — ABNORMAL HIGH (ref 70–99)
Potassium: 4.5 mmol/L (ref 3.5–5.1)
Sodium: 135 mmol/L (ref 135–145)

## 2024-05-22 LAB — GLUCOSE, CAPILLARY
Glucose-Capillary: 167 mg/dL — ABNORMAL HIGH (ref 70–99)
Glucose-Capillary: 221 mg/dL — ABNORMAL HIGH (ref 70–99)
Glucose-Capillary: 162 mg/dL — ABNORMAL HIGH (ref 70–99)
Glucose-Capillary: 207 mg/dL — ABNORMAL HIGH (ref 70–99)

## 2024-05-22 LAB — CULTURE, BLOOD (ROUTINE X 2)
Culture: NO GROWTH
Culture: NO GROWTH
Special Requests: ADEQUATE
Special Requests: ADEQUATE

## 2024-05-22 MED ORDER — INSULIN ASPART 100 UNIT/ML IJ SOLN
0.0000 [IU] | Freq: Three times a day (TID) | INTRAMUSCULAR | Status: DC
  Administered 2024-05-23: 3 [IU] via SUBCUTANEOUS
  Filled 2024-05-22: qty 5

## 2024-05-22 MED ORDER — FUROSEMIDE 40 MG PO TABS
40.0000 mg | ORAL_TABLET | Freq: Every day | ORAL | Status: DC
  Administered 2024-05-23: 40 mg via ORAL
  Filled 2024-05-22: qty 1

## 2024-05-22 MED ORDER — FUROSEMIDE 10 MG/ML IJ SOLN
60.0000 mg | Freq: Once | INTRAMUSCULAR | Status: AC
  Administered 2024-05-22: 60 mg via INTRAVENOUS
  Filled 2024-05-22: qty 6

## 2024-05-22 MED ORDER — FERROUS SULFATE 325 (65 FE) MG PO TABS
325.0000 mg | ORAL_TABLET | Freq: Every day | ORAL | Status: DC
  Administered 2024-05-23: 325 mg via ORAL
  Filled 2024-05-22: qty 1

## 2024-05-22 NOTE — Care Management Important Message (Signed)
 Important Message  Patient Details  Name: Shelly [name] MRN: [id number] Date of Birth: 24-Aug-1955   Important Message Given:  Yes - Medicare IM     Claretta Deed 05/22/2024, 3:11 PM

## 2024-05-22 NOTE — Progress Notes (Signed)
 Patient reported constipation and last BM 05/15/24.  Mansy, MD notified and RN requested additional dose of Miralax .  Order entered by Provider for Miralax  x1 as well as PRN fleet enema. Patient declines enema at this time.

## 2024-05-22 NOTE — Progress Notes (Signed)
 PROGRESS NOTE        PATIENT DETAILS Name: Shelly [name] Age: 68 y.o. Sex: female Date of Birth: [birth year] Admit Date: 05/17/2024 Admitting Physician Blease Quiver, MD ERE:Dyjt, Joen, MD  Brief Summary: Patient is a 68 y.o.  female with history of chronic HFpEF, HTN, HLD, DM-2-who presented with worsening shortness of breath-patient was found to have HFpEF exacerbation/non-STEMI/hypoxemia-and subsequently admitted to the hospitalist service.  Significant events: 11/5>> admit to [facility]  Significant studies: 10/22>> echo: EF 55%, mild to moderate mitral valve regurgitation 11/5>> CXR: New small right pleural effusion/small left pleural fluid.  Mild vascular congestion. 11/5>> CT abdomen/pelvis: Nodular consolidation in the lingula-atelectasis or focus of pneumonia.  Moderate nonspecific perinephric stranding. 11/6>> VQ scan: No perfusion defects. 11/9>> renal ultrasound: No hydronephrosis  Significant microbiology data: 11/5>> COVID/influenza/RSV PCR: Negative 11/5>> respiratory virus panel: Negative 11/5>> blood culture: No growth  Procedures: None  Consults: Cardiology Nephrology.  Subjective: Feels better-still somewhat short of breath when she ambulates but better than the past several days.  Objective: Vitals: Blood pressure (!) 170/51, pulse 69, temperature 97.7 F (36.5 C), temperature source Oral, resp. rate 12, height 5' 2 (1.575 m), weight 83 kg, SpO2 92%.   Exam: Gen Exam:Alert awake-not in any distress HEENT:atraumatic, normocephalic Chest: Few bibasilar rales CVS:S1S2 regular Abdomen:soft non tender, non distended Extremities:no edema Neurology: Non focal Skin: no rash  Pertinent Labs/Radiology:    Latest Ref Rng & Units 05/20/2024    4:02 AM 05/19/2024    6:14 AM 05/18/2024    2:09 AM  CBC  WBC 4.0 - 10.5 K/uL 14.5  12.6  11.5   Hemoglobin 12.0 - 15.0 g/dL 8.3  8.3  8.6   Hematocrit 36.0 - 46.0 % 25.8  25.4   28.0   Platelets 150 - 400 K/uL 249  198  182     Lab Results  Component Value Date   NA 135 05/22/2024   K 4.5 05/22/2024   CL 102 05/22/2024   CO2 26 05/22/2024      Assessment/Plan: Non-STEMI No further chest pain Respiratory status much better No longer on IV heparin  Due to renal function-LHC deferred-stress test ordered for 11/10 as an outpatient, discussed with cardiology today-they continue to recommend outpatient stress test-will be rescheduled for 11/13. Continue continue aspirin /beta-blocker-intolerant to statin. Cardiology following.  Acute on chronic HFpEF No peripheral edema but some bibasilar rales-continues to have some exertional dyspnea although better Continue IV diuretics per nephrology service. Continue to follow intake/output/daily weights/electrolytes.  Acute hypoxic respiratory failure Secondary to HFpEF exacerbation On room air at rest but per nursing staff-requires a little bit of oxygen when she walks May need ambulatory O2 saturation prior to discharge.  HTN BP relatively stable Continue Coreg /hydralazine /transdermal clonidine   AKI on CKD stage 4 Creatinine unfortunately continues to uptrend-suspicion that this is secondary to better blood blood pressure control-diuretic use. UA with minimal proteinuria-unchanged from previous Renal ultrasound negative for hydronephrosis Appreciate nephrology input-Will defer diuretics to nephrology team.    Normocytic anemia Suspect this is from underlying CKD Follow CBC and transfuse if significant drop  DM-2 (A1c 6.9 on 11/5) CBG stable SSI  Recent Labs    05/21/24 2013 05/21/24 2349 05/22/24 0726  GLUCAP 174* 131* 167*    HLD Intolerant to statin per allergy list Continue Zetia  GERD PPI  Asymptomatic bacteriuria No clinical features consistent  with UTI CT does show some perinephric stranding but nonspecific-with no corresponding clinical features. No longer on Rocephin   Nodular  consolidation in lingula Incidental finding on CT chest No clinical symptoms suggestive of PNA Doubt requires antibiotics Needs repeat CT imaging in a few months to document resolution of this lesion.  Anxiety disorder As needed Xanax   Class 2 Obesity: Estimated body mass index is 33.47 kg/m as calculated from the following:   Height as of this encounter: 5' 2 (1.575 m).   Weight as of this encounter: 83 kg.   Code status:   Code Status: Full Code   DVT Prophylaxis:SQ Heparin    Family Communication: None at bedside   Disposition Plan: Status is: Inpatient Remains inpatient appropriate because: Severity of illness   Planned Discharge Destination:Home likely on 11/11   Diet: Diet Order             Diet heart healthy/carb modified Fluid consistency: Thin; Fluid restriction: 1500 mL Fluid  Diet effective now                     Antimicrobial agents: Anti-infectives (From admission, onward)    Start     Dose/Rate Route Frequency Ordered Stop   05/19/24 1200  vancomycin (VANCOREADY) IVPB 500 mg/100 mL  Status:  Discontinued        500 mg 100 mL/hr over 60 Minutes Intravenous Every 48 hours 05/17/24 2115 05/17/24 2118   05/19/24 1200  vancomycin (VANCOREADY) IVPB 750 mg/150 mL  Status:  Discontinued        750 mg 150 mL/hr over 60 Minutes Intravenous Every 48 hours 05/17/24 2118 05/18/24 0854   05/18/24 1600  ceFEPIme (MAXIPIME) 2 g in sodium chloride  0.9 % 100 mL IVPB  Status:  Discontinued        2 g 200 mL/hr over 30 Minutes Intravenous Every 24 hours 05/17/24 2115 05/18/24 0854   05/18/24 1000  cefTRIAXone  (ROCEPHIN ) 1 g in sodium chloride  0.9 % 100 mL IVPB        1 g 200 mL/hr over 30 Minutes Intravenous Every 24 hours 05/18/24 0854 05/19/24 1046   05/18/24 0400  metroNIDAZOLE  (FLAGYL ) IVPB 500 mg  Status:  Discontinued        500 mg 100 mL/hr over 60 Minutes Intravenous Every 12 hours 05/17/24 2047 05/18/24 0854   05/17/24 2100  metroNIDAZOLE  (FLAGYL )  IVPB 500 mg  Status:  Discontinued        500 mg 100 mL/hr over 60 Minutes Intravenous Every 12 hours 05/17/24 2047 05/17/24 2047   05/17/24 1615  ceFEPIme (MAXIPIME) 2 g in sodium chloride  0.9 % 100 mL IVPB        2 g 200 mL/hr over 30 Minutes Intravenous  Once 05/17/24 1605 05/17/24 1649   05/17/24 1615  metroNIDAZOLE  (FLAGYL ) IVPB 500 mg        500 mg 100 mL/hr over 60 Minutes Intravenous  Once 05/17/24 1605 05/17/24 1727   05/17/24 1615  vancomycin (VANCOCIN) IVPB 1000 mg/200 mL premix        1,000 mg 200 mL/hr over 60 Minutes Intravenous  Once 05/17/24 1605 05/17/24 1726        MEDICATIONS: Scheduled Meds:  aspirin  EC  81 mg Oral Daily   carvedilol   25 mg Oral BID WC   cloNIDine   0.2 mg Transdermal Weekly   ezetimibe  10 mg Oral Daily   furosemide   60 mg Intravenous Q8H   heparin   5,000 Units Subcutaneous Q8H  hydrALAZINE   50 mg Oral Q8H   insulin  aspart  0-9 Units Subcutaneous Q4H   melatonin  5 mg Oral QHS   pantoprazole   40 mg Oral Daily   polyethylene glycol  17 g Oral Daily   senna-docusate  2 tablet Oral QHS   Continuous Infusions:   PRN Meds:.acetaminophen  **OR** acetaminophen , albuterol , ALPRAZolam , bisacodyl , fentaNYL  (SUBLIMAZE ) injection, hydrALAZINE , HYDROcodone -acetaminophen , labetalol, nitroGLYCERIN , ondansetron  **OR** ondansetron  (ZOFRAN ) IV   I have personally reviewed following labs and imaging studies  LABORATORY DATA: CBC: Recent Labs  Lab 05/17/24 1408 05/18/24 0209 05/19/24 0614 05/20/24 0402  WBC 15.0* 11.5* 12.6* 14.5*  HGB 9.0* 8.6* 8.3* 8.3*  HCT 28.0* 28.0* 25.4* 25.8*  MCV 95.9 99.3 93.7 94.5  PLT 240 182 198 249    Basic Metabolic Panel: Recent Labs  Lab 05/17/24 2044 05/18/24 0209 05/18/24 1020 05/19/24 0614 05/20/24 0402 05/21/24 0426 05/22/24 0405  NA 138  --  133* 134* 134* 134* 135  K 3.6  --  5.1 5.0 5.0 4.8 4.5  CL 113*  --  106 104 102 103 102  CO2 17*  --  17* 18* 20* 21* 26  GLUCOSE 116*  --  251* 174*  129* 125* 136*  BUN 36*  --  37* 51* 58* 66* 65*  CREATININE 2.01*  --  2.37* 2.59* 2.86* 3.12* 2.71*  CALCIUM  6.5*  --  8.2* 8.3* 8.4* 8.3* 8.4*  MG 1.2*  --   --  2.0  --   --   --   PHOS 2.5 3.4  --   --   --   --   --     GFR: Estimated Creatinine Clearance: 19.9 mL/min (A) (by C-G formula based on SCr of 2.71 mg/dL (H)).  Liver Function Tests: Recent Labs  Lab 05/17/24 1556  AST 14*  ALT 11  ALKPHOS 51  BILITOT 0.9  PROT 6.7  ALBUMIN 2.9*   Recent Labs  Lab 05/17/24 1556  LIPASE 18   No results for input(s): AMMONIA in the last 168 hours.  Coagulation Profile: Recent Labs  Lab 05/17/24 2044  INR 1.6*    Cardiac Enzymes: Recent Labs  Lab 05/17/24 2044  CKTOTAL 39    BNP (last 3 results) No results for input(s): PROBNP in the last 8760 hours.  Lipid Profile: No results for input(s): CHOL, HDL, LDLCALC, TRIG, CHOLHDL, LDLDIRECT in the last 72 hours.  Thyroid  Function Tests: No results for input(s): TSH, T4TOTAL, FREET4, T3FREE, THYROIDAB in the last 72 hours.   Anemia Panel: No results for input(s): VITAMINB12, FOLATE, FERRITIN, TIBC, IRON , RETICCTPCT in the last 72 hours.   Urine analysis:    Component Value Date/Time   COLORURINE YELLOW 05/17/2024 1759   APPEARANCEUR CLEAR 05/17/2024 1759   LABSPEC 1.011 05/17/2024 1759   PHURINE 5.0 05/17/2024 1759   GLUCOSEU NEGATIVE 05/17/2024 1759   HGBUR SMALL (A) 05/17/2024 1759   BILIRUBINUR NEGATIVE 05/17/2024 1759   KETONESUR NEGATIVE 05/17/2024 1759   PROTEINUR 100 (A) 05/17/2024 1759   UROBILINOGEN 0.2 04/20/2015 1418   NITRITE POSITIVE (A) 05/17/2024 1759   LEUKOCYTESUR TRACE (A) 05/17/2024 1759    Sepsis Labs: Lactic Acid, Venous    Component Value Date/Time   LATICACIDVEN 1.1 05/17/2024 2330    MICROBIOLOGY: Recent Results (from the past 240 hours)  Blood culture (routine x 2)     Status: None   Collection Time: 05/17/24  3:54 PM   Specimen:  BLOOD LEFT ARM  Result Value Ref Range Status  Specimen Description BLOOD LEFT ARM  Final   Special Requests   Final    BOTTLES DRAWN AEROBIC AND ANAEROBIC Blood Culture adequate volume   Culture   Final    NO GROWTH 5 DAYS Performed at [facility], [address], [place], [place] [address]    Report Status 05/22/2024 FINAL  Final  Blood culture (routine x 2)     Status: None   Collection Time: 05/17/24  4:01 PM   Specimen: BLOOD  Result Value Ref Range Status   Specimen Description BLOOD SITE NOT SPECIFIED  Final   Special Requests   Final    BOTTLES DRAWN AEROBIC AND ANAEROBIC Blood Culture adequate volume   Culture   Final    NO GROWTH 5 DAYS Performed at [facility], [address] [place], [place] [address]    Report Status 05/22/2024 FINAL  Final  Resp panel by RT-PCR (RSV, Flu A&B, Covid) Anterior Nasal Swab     Status: None   Collection Time: 05/17/24  7:19 PM   Specimen: Anterior Nasal Swab  Result Value Ref Range Status   SARS Coronavirus 2 by RT PCR NEGATIVE NEGATIVE Final   Influenza A by PCR NEGATIVE NEGATIVE Final   Influenza B by PCR NEGATIVE NEGATIVE Final    Comment: (NOTE) The Xpert Xpress SARS-CoV-2/FLU/RSV plus assay is intended as an aid in the diagnosis of influenza from Nasopharyngeal swab specimens and should not be used as a sole basis for treatment. Nasal washings and aspirates are unacceptable for Xpert Xpress SARS-CoV-2/FLU/RSV testing.  Fact Sheet for Patients: bloggercourse.com  Fact Sheet for Healthcare Providers: seriousbroker.it  This test is not yet approved or cleared by the [place]  FDA and has been authorized for detection and/or diagnosis of SARS-CoV-2 by FDA under an Emergency Use Authorization (EUA). This EUA will remain in effect (meaning this test can be used) for the duration of the COVID-19 declaration under Section 564(b)(1) of the Act, 21 U.S.C. section  360bbb-3(b)(1), unless the authorization is terminated or revoked.     Resp Syncytial Virus by PCR NEGATIVE NEGATIVE Final    Comment: (NOTE) Fact Sheet for Patients: bloggercourse.com  Fact Sheet for Healthcare Providers: seriousbroker.it  This test is not yet approved or cleared by the [place]  FDA and has been authorized for detection and/or diagnosis of SARS-CoV-2 by FDA under an Emergency Use Authorization (EUA). This EUA will remain in effect (meaning this test can be used) for the duration of the COVID-19 declaration under Section 564(b)(1) of the Act, 21 U.S.C. section 360bbb-3(b)(1), unless the authorization is terminated or revoked.  Performed at [facility], [address] [place], [place] [address]   Respiratory (~20 pathogens) panel by PCR     Status: None   Collection Time: 05/17/24  7:19 PM   Specimen: Nasopharyngeal Swab; Respiratory  Result Value Ref Range Status   Adenovirus NOT DETECTED NOT DETECTED Final   Coronavirus 229E NOT DETECTED NOT DETECTED Final    Comment: (NOTE) The Coronavirus on the Respiratory Panel, DOES NOT test for the novel  Coronavirus (2019 nCoV)    Coronavirus HKU1 NOT DETECTED NOT DETECTED Final   Coronavirus NL63 NOT DETECTED NOT DETECTED Final   Coronavirus OC43 NOT DETECTED NOT DETECTED Final   Metapneumovirus NOT DETECTED NOT DETECTED Final   Rhinovirus / Enterovirus NOT DETECTED NOT DETECTED Final   Influenza A NOT DETECTED NOT DETECTED Final   Influenza B NOT DETECTED NOT DETECTED Final  Parainfluenza Virus 1 NOT DETECTED NOT DETECTED Final   Parainfluenza Virus 2 NOT DETECTED NOT DETECTED Final   Parainfluenza Virus 3 NOT DETECTED NOT DETECTED Final   Parainfluenza Virus 4 NOT DETECTED NOT DETECTED Final   Respiratory Syncytial Virus NOT DETECTED NOT DETECTED Final   Bordetella pertussis NOT DETECTED NOT DETECTED Final   Bordetella Parapertussis NOT DETECTED  NOT DETECTED Final   Chlamydophila pneumoniae NOT DETECTED NOT DETECTED Final   Mycoplasma pneumoniae NOT DETECTED NOT DETECTED Final    Comment: Performed at [facility], [address] [place], [place] [address]  Urine Culture (for pregnant, neutropenic or urologic patients or patients with an indwelling urinary catheter)     Status: Abnormal   Collection Time: 05/18/24  8:51 AM   Specimen: Urine, Clean Catch  Result Value Ref Range Status   Specimen Description URINE, CLEAN CATCH  Final   Special Requests   Final    NONE Performed at [facility], [address], [place], [place] [address]    Culture MULTIPLE SPECIES PRESENT, SUGGEST RECOLLECTION (A)  Final   Report Status 05/20/2024 FINAL  Final    RADIOLOGY STUDIES/RESULTS: US  RENAL Result Date: 05/21/2024 CLINICAL DATA:  [id number] AKI (acute kidney injury) [id number] EXAM: RENAL / URINARY TRACT ULTRASOUND COMPLETE COMPARISON:  May 17, 2024 FINDINGS: Right Kidney: Renal measurements: 9.6 x 4.2 x 3.5 cm = volume: 75 mL. Echogenicity is mildly increased. No mass or hydronephrosis visualized. Cortical thinning of the superior pole. Left Kidney: Renal measurements: 10.2 x 5.1 x 4.7 cm = volume: 129 mL. Echogenicity within normal limits. No mass or hydronephrosis visualized. Bladder: Appears normal for degree of bladder distention. Other: Bilateral pleural effusions. IMPRESSION: 1. No hydronephrosis. 2. Bilateral pleural effusions. Electronically Signed   By: Corean Salter M.D.   On: 05/21/2024 09:38     LOS: 5 days   Donalda Applebaum, MD  [facility]    To contact the attending provider between 7A-7P or the covering provider during after hours 7P-7A, please log into the web site [email or web address] and access using universal [place] password for that web site. If you do not have the password, please call the hospital operator.  05/22/2024, 11:07 AM

## 2024-05-22 NOTE — Plan of Care (Signed)
  Problem: Fluid Volume: Goal: Hemodynamic stability will improve Outcome: Progressing   Problem: Clinical Measurements: Goal: Diagnostic test results will improve Outcome: Progressing Goal: Signs and symptoms of infection will decrease Outcome: Progressing   Problem: Respiratory: Goal: Ability to maintain adequate ventilation will improve Outcome: Progressing   Problem: Education: Goal: Ability to describe self-care measures that may prevent or decrease complications (Diabetes Survival Skills Education) will improve Outcome: Progressing Goal: Individualized Educational Video(s) Outcome: Progressing   Problem: Coping: Goal: Ability to adjust to condition or change in health will improve Outcome: Progressing   Problem: Fluid Volume: Goal: Ability to maintain a balanced intake and output will improve Outcome: Progressing   Problem: Health Behavior/Discharge Planning: Goal: Ability to identify and utilize available resources and services will improve Outcome: Progressing Goal: Ability to manage health-related needs will improve Outcome: Progressing   Problem: Metabolic: Goal: Ability to maintain appropriate glucose levels will improve Outcome: Progressing   Problem: Nutritional: Goal: Maintenance of adequate nutrition will improve Outcome: Progressing Goal: Progress toward achieving an optimal weight will improve Outcome: Progressing   Problem: Skin Integrity: Goal: Risk for impaired skin integrity will decrease Outcome: Progressing   Problem: Tissue Perfusion: Goal: Adequacy of tissue perfusion will improve Outcome: Progressing   Problem: Activity: Goal: Ability to tolerate increased activity will improve Outcome: Progressing   Problem: Clinical Measurements: Goal: Ability to maintain a body temperature in the normal range will improve Outcome: Progressing   Problem: Respiratory: Goal: Ability to maintain adequate ventilation will improve Outcome:  Progressing Goal: Ability to maintain a clear airway will improve Outcome: Progressing   Problem: Education: Goal: Knowledge of General Education information will improve Description: Including pain rating scale, medication(s)/side effects and non-pharmacologic comfort measures Outcome: Progressing   Problem: Health Behavior/Discharge Planning: Goal: Ability to manage health-related needs will improve Outcome: Progressing   Problem: Clinical Measurements: Goal: Ability to maintain clinical measurements within normal limits will improve Outcome: Progressing Goal: Will remain free from infection Outcome: Progressing Goal: Diagnostic test results will improve Outcome: Progressing Goal: Respiratory complications will improve Outcome: Progressing Goal: Cardiovascular complication will be avoided Outcome: Progressing   Problem: Activity: Goal: Risk for activity intolerance will decrease Outcome: Progressing   Problem: Nutrition: Goal: Adequate nutrition will be maintained Outcome: Progressing   Problem: Coping: Goal: Level of anxiety will decrease Outcome: Progressing   Problem: Elimination: Goal: Will not experience complications related to bowel motility Outcome: Progressing Goal: Will not experience complications related to urinary retention Outcome: Progressing   Problem: Pain Managment: Goal: General experience of comfort will improve and/or be controlled Outcome: Progressing   Problem: Safety: Goal: Ability to remain free from injury will improve Outcome: Progressing   Problem: Skin Integrity: Goal: Risk for impaired skin integrity will decrease Outcome: Progressing

## 2024-05-22 NOTE — Progress Notes (Signed)
 [facility] NEPHROLOGY PROGRESS NOTE  Assessment/ Plan: Pt is a 68 y.o. yo female with past medical history of hypertension, type 2 diabetes, dyslipidemia, chronic CHF with preserved EF presented with shortness of breath and an NSTEMI, volume overload.  # Acute kidney injury on CKD stage 4, baseline creatinine level seems to be around 2.2 (follows with Dr. Norine).  AKI in the setting of CHF exacerbation/fluid overload/NSTEMI and possibly pneumonia.  UA with chronic proteinuria, ultrasound rule out obstruction.  Treating with IV diuretics with improvement of her symptoms.  I will continue IV diuretics today and switch to p.o. Lasix  from tomorrow.  She was not on diuretics at home.  No signs or symptoms of uremia. Strict ins and outs and daily lab monitoring.    # Hypertension/volume: Diuretics as above, monitor BP.  It seems like she has issue with fluctuation in BP as outpatient.  Continue current Coreg , clonidine , hydralazine , Lasix .  Monitor BP.  # Anemia of CKD/iron  deficiency anemia: Iron  saturation is low, start oral iron .  Monitor hemoglobin.  # CKD-MBD: Corrected calcium  and phosphorus level acceptable.  Monitor lab.  # Acute hypoxic respiratory failure: Due to CHF, titrate off oxygen.  # NSTEMI, acute on chronic heart failure with preserved EF: Denies chest pain.  Deferred heart cath because of AKI.  Managing volume with diuretics.  Seen by cardiology team.  Subjective: Seen and examined at bedside.  Patient stated her breathing is much better.  Still on around 1 to 2 L of oxygen.  No nausea, vomiting, chest pain.  She is eager to go home in next few days.  Urine output documented around 800 cc only. Objective Vital signs in last 24 hours: Vitals:   05/22/24 0600 05/22/24 0723 05/22/24 1117 05/22/24 1547  BP:  (!) 170/51 (!) 175/54 (!) 163/61  Pulse: 69     Resp: 12   20  Temp:  97.7 F (36.5 C) 98 F (36.7 C) 98 F (36.7 C)  TempSrc:  Oral Oral Oral  SpO2: 92%      Weight:      Height:       Weight change:   Intake/Output Summary (Last 24 hours) at 05/22/2024 1623 Last data filed at 05/22/2024 1119 Gross per 24 hour  Intake 240 ml  Output 800 ml  Net -560 ml       Labs: RENAL PANEL Recent Labs  Lab 05/17/24 1556 05/17/24 2044 05/18/24 0209 05/18/24 1020 05/19/24 0614 05/20/24 0402 05/21/24 0426 05/22/24 0405  NA  --  138  --  133* 134* 134* 134* 135  K  --  3.6  --  5.1 5.0 5.0 4.8 4.5  CL  --  113*  --  106 104 102 103 102  CO2  --  17*  --  17* 18* 20* 21* 26  GLUCOSE  --  116*  --  251* 174* 129* 125* 136*  BUN  --  36*  --  37* 51* 58* 66* 65*  CREATININE  --  2.01*  --  2.37* 2.59* 2.86* 3.12* 2.71*  CALCIUM   --  6.5*  --  8.2* 8.3* 8.4* 8.3* 8.4*  MG  --  1.2*  --   --  2.0  --   --   --   PHOS  --  2.5 3.4  --   --   --   --   --   ALBUMIN 2.9*  --   --   --   --   --   --   --  Liver Function Tests: Recent Labs  Lab 05/17/24 1556  AST 14*  ALT 11  ALKPHOS 51  BILITOT 0.9  PROT 6.7  ALBUMIN 2.9*   Recent Labs  Lab 05/17/24 1556  LIPASE 18   No results for input(s): AMMONIA in the last 168 hours. CBC: Recent Labs    03/06/24 0652 05/17/24 1408 05/17/24 2040 05/17/24 2044 05/18/24 0209 05/19/24 0614 05/20/24 0402  HGB 10.8* 9.0*  --   --  8.6* 8.3* 8.3*  MCV 94.8 95.9  --   --  99.3 93.7 94.5  VITAMINB12  --   --   --   --  323  --   --   FOLATE  --   --   --   --  11.5  --   --   FERRITIN  --   --   --  52  --   --   --   TIBC  --   --   --  206*  --   --   --   IRON   --   --   --  12*  --   --   --   RETICCTPCT  --   --  2.1  --   --   --   --     Cardiac Enzymes: Recent Labs  Lab 05/17/24 2044  CKTOTAL 39   CBG: Recent Labs  Lab 05/21/24 2013 05/21/24 2349 05/22/24 0726 05/22/24 1121 05/22/24 1550  GLUCAP 174* 131* 167* 221* 162*    Iron  Studies: No results for input(s): IRON , TIBC, TRANSFERRIN, FERRITIN in the last 72 hours. Studies/Results: US   RENAL Result Date: 05/21/2024 CLINICAL DATA:  [id number] AKI (acute kidney injury) [id number] EXAM: RENAL / URINARY TRACT ULTRASOUND COMPLETE COMPARISON:  May 17, 2024 FINDINGS: Right Kidney: Renal measurements: 9.6 x 4.2 x 3.5 cm = volume: 75 mL. Echogenicity is mildly increased. No mass or hydronephrosis visualized. Cortical thinning of the superior pole. Left Kidney: Renal measurements: 10.2 x 5.1 x 4.7 cm = volume: 129 mL. Echogenicity within normal limits. No mass or hydronephrosis visualized. Bladder: Appears normal for degree of bladder distention. Other: Bilateral pleural effusions. IMPRESSION: 1. No hydronephrosis. 2. Bilateral pleural effusions. Electronically Signed   By: Corean Salter M.D.   On: 05/21/2024 09:38    Medications: Infusions:   Scheduled Medications:  aspirin  EC  81 mg Oral Daily   carvedilol   25 mg Oral BID WC   cloNIDine   0.2 mg Transdermal Weekly   ezetimibe  10 mg Oral Daily   furosemide   60 mg Intravenous Q8H   heparin   5,000 Units Subcutaneous Q8H   hydrALAZINE   50 mg Oral Q8H   insulin  aspart  0-9 Units Subcutaneous Q4H   melatonin  5 mg Oral QHS   pantoprazole   40 mg Oral Daily   polyethylene glycol  17 g Oral Daily   senna-docusate  2 tablet Oral QHS    have reviewed scheduled and prn medications.  Physical Exam: General:NAD, comfortable Heart:RRR, s1s2 nl Lungs:clear b/l, no crackle Abdomen:soft, Non-tender, non-distended Extremities: Trace dependent edema present Neurology: Alert awake and following commands  Yafet Cline Amelie Romney 05/22/2024,4:23 PM  LOS: 5 days

## 2024-05-23 ENCOUNTER — Other Ambulatory Visit (HOSPITAL_COMMUNITY): Payer: Self-pay

## 2024-05-23 ENCOUNTER — Telehealth (HOSPITAL_COMMUNITY): Payer: Self-pay | Admitting: *Deleted

## 2024-05-23 DIAGNOSIS — N184 Chronic kidney disease, stage 4 (severe): Secondary | ICD-10-CM | POA: Diagnosis not present

## 2024-05-23 DIAGNOSIS — I5033 Acute on chronic diastolic (congestive) heart failure: Secondary | ICD-10-CM

## 2024-05-23 DIAGNOSIS — N179 Acute kidney failure, unspecified: Secondary | ICD-10-CM | POA: Diagnosis not present

## 2024-05-23 DIAGNOSIS — I214 Non-ST elevation (NSTEMI) myocardial infarction: Secondary | ICD-10-CM | POA: Diagnosis not present

## 2024-05-23 LAB — BASIC METABOLIC PANEL WITH GFR
Anion gap: 13 (ref 5–15)
BUN: 63 mg/dL — ABNORMAL HIGH (ref 8–23)
CO2: 26 mmol/L (ref 22–32)
Calcium: 8.9 mg/dL (ref 8.9–10.3)
Chloride: 98 mmol/L (ref 98–111)
Creatinine, Ser: 2.62 mg/dL — ABNORMAL HIGH (ref 0.44–1.00)
GFR, Estimated: 19 mL/min — ABNORMAL LOW (ref >60)
Glucose, Bld: 147 mg/dL — ABNORMAL HIGH (ref 70–99)
Potassium: 4.5 mmol/L (ref 3.5–5.1)
Sodium: 137 mmol/L (ref 135–145)

## 2024-05-23 LAB — GLUCOSE, CAPILLARY
Glucose-Capillary: 149 mg/dL — ABNORMAL HIGH (ref 70–99)
Glucose-Capillary: 149 mg/dL — ABNORMAL HIGH (ref 70–99)
Glucose-Capillary: 242 mg/dL — ABNORMAL HIGH (ref 70–99)

## 2024-05-23 LAB — ALBUMIN: Albumin: 2.8 g/dL — ABNORMAL LOW (ref 3.5–5.0)

## 2024-05-23 MED ORDER — SENNOSIDES-DOCUSATE SODIUM 8.6-50 MG PO TABS
2.0000 | ORAL_TABLET | Freq: Every day | ORAL | 0 refills | Status: AC
  Filled 2024-05-23: qty 30, 15d supply, fill #0

## 2024-05-23 MED ORDER — HYDRALAZINE HCL 50 MG PO TABS
50.0000 mg | ORAL_TABLET | Freq: Three times a day (TID) | ORAL | 2 refills | Status: DC
  Filled 2024-05-23: qty 180, 60d supply, fill #0

## 2024-05-23 MED ORDER — FUROSEMIDE 40 MG PO TABS
40.0000 mg | ORAL_TABLET | Freq: Every day | ORAL | 1 refills | Status: AC
  Filled 2024-05-23: qty 30, 30d supply, fill #0

## 2024-05-23 MED ORDER — ASPIRIN 81 MG PO TBEC
81.0000 mg | DELAYED_RELEASE_TABLET | Freq: Every day | ORAL | 12 refills | Status: AC
  Filled 2024-05-23: qty 30, 30d supply, fill #0

## 2024-05-23 MED ORDER — FERROUS SULFATE 325 (65 FE) MG PO TABS
325.0000 mg | ORAL_TABLET | Freq: Every day | ORAL | 1 refills | Status: AC
  Filled 2024-05-23: qty 30, 30d supply, fill #0

## 2024-05-23 MED ORDER — POLYETHYLENE GLYCOL 3350 17 GM/SCOOP PO POWD
17.0000 g | Freq: Every day | ORAL | 0 refills | Status: AC
  Filled 2024-05-23: qty 238, 14d supply, fill #0

## 2024-05-23 NOTE — Discharge Summary (Signed)
 PATIENT DETAILS Name: Shelly [name] Age: 68 y.o. Sex: female Date of Birth: [birth year] MRN: [id number]. Admitting Physician: Blease Quiver, MD ERE:Dyjt, Joen, MD  Admit Date: 05/17/2024 Discharge date: 05/23/2024  Recommendations for Outpatient Follow-up:  Follow up with PCP in 1-2 weeks Please obtain CMP/CBC in one week Please ensure follow up with nephrology, cardiology Repeat chest in 1-2 months-see below.  Admitted From:  Home  Disposition: Home   Discharge Condition: good  CODE STATUS:   Code Status: Full Code   Diet recommendation:  Diet Order             Diet - low sodium heart healthy           Diet Carb Modified           Diet heart healthy/carb modified Fluid consistency: Thin; Fluid restriction: 1500 mL Fluid  Diet effective now                    Brief Summary: Patient is a 68 y.o.  female with history of chronic HFpEF, HTN, HLD, DM-2-who presented with worsening shortness of breath-patient was found to have HFpEF exacerbation/non-STEMI/hypoxemia-and subsequently admitted to the hospitalist service.   Significant events: 11/5>> admit to [facility]   Significant studies: 10/22>> echo: EF 55%, mild to moderate mitral valve regurgitation 11/5>> CXR: New small right pleural effusion/small left pleural fluid.  Mild vascular congestion. 11/5>> CT abdomen/pelvis: Nodular consolidation in the lingula-atelectasis or focus of pneumonia.  Moderate nonspecific perinephric stranding. 11/6>> VQ scan: No perfusion defects. 11/9>> renal ultrasound: No hydronephrosis   Significant microbiology data: 11/5>> COVID/influenza/RSV PCR: Negative 11/5>> respiratory virus panel: Negative 11/5>> blood culture: No growth   Procedures: None   Consults: Cardiology Nephrology.  Brief Hospital Course: Non-STEMI No further chest pain Treated with antiplatelet/beta-blocker/IV heparin  Due to renal function-LHC deferred-outpatient stress test scheduled by  cardiology for 11/13.  Subsequently cardiology has arranged for outpatient follow-up in the office as well.  Continue continue aspirin /beta-blocker-intolerant to statin.   Acute on chronic HFpEF Improved-no peripheral edema-respiratory status much better overnight-ambulating without any major issues-on room air. Nephrology was consulted for IV diuretics given worsening AKI-thankfully has stabilized-discussed with nephrologist-Dr. Nasario on furosemide  40 mg p.o. daily Ensure outpatient follow-up with nephrology-see's Dr. Norine Continue to follow volume status electrolytes closely.    Acute hypoxic respiratory failure Secondary to HFpEF exacerbation Significant improvement overnight-very anxious to go home-stable on room air   HTN BP relatively stable Continue Coreg /hydralazine /transdermal clonidine /lasix  Follow/optimize   AKI on CKD stage 4 AKI likely hemodynamically mediated secondary diuretic use and better blood pressure control UA with minimal proteinuria-unchanged from previous Renal ultrasound negative for hydronephrosis Management supportive care-creatinine started to downtrend Please ensure follow-up with outpatient nephrology.   Normocytic anemia Suspect this is from underlying CKD Follow CBC periodically   DM-2 (A1c 6.9 on 11/5) CBG stable while inpatient Appears diet controlled  HLD Intolerant to statin per allergy list Continue Zetia   GERD PPI   Asymptomatic bacteriuria No clinical features consistent with UTI CT does show some perinephric stranding but nonspecific-with no corresponding clinical features. No longer on Rocephin    Nodular consolidation in lingula Incidental finding on CT chest No clinical symptoms suggestive of PNA Doubt requires antibiotics Needs repeat CT imaging in a few months to document resolution of this lesion.   Anxiety disorder As needed Xanax    Class 2 Obesity: Estimated body mass index is 33.47 kg/m as  calculated from the following:   Height as of this  encounter: 5' 2 (1.575 m).   Weight as of this encounter: 83 kg.   Discharge Diagnoses:  Principal Problem:   Sepsis (HCC) Active Problems:   NSTEMI (non-ST elevated myocardial infarction) (HCC)   Anxiety   ARF (acute renal failure)   Diabetes mellitus (HCC)   Anemia   CKD (chronic kidney disease), stage IV (HCC)   Gastro-esophageal reflux disease without esophagitis   Mixed hyperlipidemia   Chronic systolic (congestive) heart failure (HCC)   Hypomagnesemia   UTI (urinary tract infection)   Abdominal pain   CAP (community acquired pneumonia)   Asthma, chronic obstructive, with acute exacerbation (HCC)   Acute on chronic heart failure with preserved ejection fraction (HFpEF) (HCC)   Discharge Instructions:  Activity:  As tolerated   Discharge Instructions     Diet - low sodium heart healthy   Complete by: As directed    Diet Carb Modified   Complete by: As directed    Discharge instructions   Complete by: As directed    Follow with Primary MD  Loreli Kins, MD in 1-2 weeks  You will follow-up with cardiology arranged-include a stress test scheduled for 11/13-their office will call you with instructions.  Please get a complete blood count and chemistry panel checked by your Primary MD at your next visit, and again as instructed by your Primary MD.  Get Medicines reviewed and adjusted: Please take all your medications with you for your next visit with your Primary MD  Laboratory/radiological data: Please request your Primary MD to go over all hospital tests and procedure/radiological results at the follow up, please ask your Primary MD to get all Hospital records sent to his/her office.  In some cases, they will be blood work, cultures and biopsy results pending at the time of your discharge. Please request that your primary care M.D. follows up on these results.  Also Note the following: If you experience  worsening of your admission symptoms, develop shortness of breath, life threatening emergency, suicidal or homicidal thoughts you must seek medical attention immediately by calling 911 or calling your MD immediately  if symptoms less severe.  You must read complete instructions/literature along with all the possible adverse reactions/side effects for all the Medicines you take and that have been prescribed to you. Take any new Medicines after you have completely understood and accpet all the possible adverse reactions/side effects.   Do not drive when taking Pain medications or sleeping medications (Benzodaizepines)  Do not take more than prescribed Pain, Sleep and Anxiety Medications. It is not advisable to combine anxiety,sleep and pain medications without talking with your primary care practitioner  Special Instructions: If you have smoked or chewed Tobacco  in the last 2 yrs please stop smoking, stop any regular Alcohol  and or any Recreational drug use.  Wear Seat belts while driving.  Please note: You were cared for by a hospitalist during your hospital stay. Once you are discharged, your primary care physician will handle any further medical issues. Please note that NO REFILLS for any discharge medications will be authorized once you are discharged, as it is imperative that you return to your primary care physician (or establish a relationship with a primary care physician if you do not have one) for your post hospital discharge needs so that they can reassess your need for medications and monitor your lab values.   Increase activity slowly   Complete by: As directed       Allergies as of 05/23/2024  Reactions   Singulair  [montelukast  Sodium] Other (See Comments)   Vivid dreams   Amlodipine  Besylate    Other reaction(s): fatgiue and nausea   Atorvastatin    Other reaction(s): strange feelings   Lisinopril Cough   Metformin Diarrhea   Penicillins Other (See Comments)   Other  reaction(s): rash Unknown reaction.    Sulfa Antibiotics Nausea And Vomiting   Welchol  [colesevelam Hcl]    Other reaction(s): constipation   Dilaudid  [hydromorphone  Hcl] Anxiety, Other (See Comments)   paranoia   Sulfamethoxazole-trimethoprim Rash        Medication List     STOP taking these medications    cephALEXin 250 MG capsule Commonly known as: KEFLEX   cloNIDine  0.1 MG tablet Commonly known as: CATAPRES    doxazosin  2 MG tablet Commonly known as: CARDURA    omeprazole  40 MG capsule Commonly known as: PRILOSEC        TAKE these medications    albuterol  (2.5 MG/3ML) 0.083% nebulizer solution Commonly known as: PROVENTIL  Take 3 mLs (2.5 mg total) by nebulization every 6 (six) hours as needed for wheezing or shortness of breath.   ALPRAZolam  0.5 MG tablet Commonly known as: XANAX  Take 0.5 mg by mouth 3 (three) times daily as needed. Anxiety   aspirin  EC 81 MG tablet Take 1 tablet (81 mg total) by mouth daily. Swallow whole.   carvedilol  25 MG tablet Commonly known as: COREG  Take 25 mg by mouth 2 (two) times daily with a meal.   cloNIDine  0.2 mg/24hr patch Commonly known as: CATAPRES  - Dosed in mg/24 hr Place 0.2 mg onto the skin once a week.   Combivent Respimat 20-100 MCG/ACT Aers respimat Generic drug: Ipratropium-Albuterol  Take 1 puff by mouth every 6 (six) hours as needed for shortness of breath.   estradiol 0.1 MG/GM vaginal cream Commonly known as: ESTRACE Place 1 Applicatorful vaginally 2 (two) times a week.   ezetimibe 10 MG tablet Commonly known as: ZETIA Take 10 mg by mouth daily.   famotidine  40 MG tablet Commonly known as: PEPCID  Take 40 mg by mouth at bedtime.   ferrous sulfate 325 (65 FE) MG tablet Take 1 tablet (325 mg total) by mouth daily with breakfast.   fluticasone  50 MCG/ACT nasal spray Commonly known as: FLONASE  Place 1 spray into both nostrils daily as needed. For allergy nasal congestion   furosemide  40 MG  tablet Commonly known as: LASIX  Take 1 tablet (40 mg total) by mouth daily.   hydrALAZINE  50 MG tablet Commonly known as: APRESOLINE  Take 1 tablet (50 mg total) by mouth 3 (three) times daily. What changed:  medication strength how much to take   HYDROcodone -acetaminophen  5-325 MG tablet Commonly known as: NORCO/VICODIN Take 0.5 tablets by mouth every 6 (six) hours as needed for moderate pain (pain score 4-6).   melatonin 5 MG Tabs Take 5 mg by mouth at bedtime.   pantoprazole  40 MG tablet Commonly known as: PROTONIX  Take 40 mg by mouth daily.   polyethylene glycol 17 g packet Commonly known as: MIRALAX  / GLYCOLAX  Take 17 g by mouth daily.   promethazine  25 MG tablet Commonly known as: PHENERGAN  Take 1 tablet (25 mg total) by mouth daily as needed. What changed: reasons to take this   senna-docusate 8.6-50 MG tablet Commonly known as: Senokot-S Take 2 tablets by mouth at bedtime.        Follow-up Information     Loreli Kins, MD. Schedule an appointment as soon as possible for a visit in 1  week(s).   Specialty: Family Medicine Contact information: [address] [place] [address] [phone]                Allergies  Allergen Reactions   Singulair  [Montelukast  Sodium] Other (See Comments)    Vivid dreams   Amlodipine  Besylate     Other reaction(s): fatgiue and nausea   Atorvastatin     Other reaction(s): strange feelings   Lisinopril Cough   Metformin Diarrhea   Penicillins Other (See Comments)    Other reaction(s): rash Unknown reaction.    Sulfa Antibiotics Nausea And Vomiting   Welchol  [Colesevelam Hcl]     Other reaction(s): constipation   Dilaudid  [Hydromorphone  Hcl] Anxiety and Other (See Comments)    paranoia   Sulfamethoxazole-Trimethoprim Rash     Other Procedures/Studies: US  RENAL Result Date: 05/21/2024 CLINICAL DATA:  [id number] AKI (acute kidney injury) [id number] EXAM: RENAL / URINARY TRACT ULTRASOUND  COMPLETE COMPARISON:  May 17, 2024 FINDINGS: Right Kidney: Renal measurements: 9.6 x 4.2 x 3.5 cm = volume: 75 mL. Echogenicity is mildly increased. No mass or hydronephrosis visualized. Cortical thinning of the superior pole. Left Kidney: Renal measurements: 10.2 x 5.1 x 4.7 cm = volume: 129 mL. Echogenicity within normal limits. No mass or hydronephrosis visualized. Bladder: Appears normal for degree of bladder distention. Other: Bilateral pleural effusions. IMPRESSION: 1. No hydronephrosis. 2. Bilateral pleural effusions. Electronically Signed   By: Corean Salter M.D.   On: 05/21/2024 09:38   NM Pulmonary Perfusion Result Date: 05/18/2024 EXAM: NM Lung Perfusion Scan. CLINICAL HISTORY: Dyspnea on exertion (DOE). TECHNIQUE: Radiolabeled MAA was administered intravenously and planar images of the lungs were obtained in multiple projections. RADIOPHARMACEUTICAL: MAA, 4.3 ml administered intravenously. COMPARISON: Chest radiograph 05/18/2024. FINDINGS: PERFUSION: No wedge-shaped peripheral perfusion defect within left or right lung to suggest acute pulmonary embolism. Normal perfusion pattern. Decreased regional attenuation in the lower lobes related to pleural effusions on radiograph. IMPRESSION: 1. No perfusion defects to indicate pulmonary embolism. 2. Decreased regional attenuation in the lower lobes related to pleural effusions on radiograph. Electronically signed by: Norleen Boxer MD 05/18/2024 03:08 PM EST RP Workstation: [id number]   DG CHEST PORT 1 VIEW Result Date: 05/18/2024 EXAM: 1 VIEW(S) XRAY OF THE CHEST 05/18/2024 05:30:00 AM COMPARISON: 05/17/2024 CLINICAL HISTORY: Shortness of breath FINDINGS: LUNGS AND PLEURA: Mild interstitial edema. Progressive bilateral pleural effusions resulting in veil-like opacification over the lower lung zones. No pneumothorax. HEART AND MEDIASTINUM: Mild cardiomegaly. Aortic atherosclerosis. BONES AND SOFT TISSUES: No acute osseous abnormality. IMPRESSION:  1. Progressive bilateral pleural effusions causing veil-like opacification over the lower lung zones. 2. Mild interstitial edema. Electronically signed by: Waddell Calk MD 05/18/2024 06:04 AM EST RP Workstation: [id number]   CT ABDOMEN PELVIS WO CONTRAST Result Date: 05/17/2024 CLINICAL DATA:  Sepsis abdominal pain EXAM: CT ABDOMEN AND PELVIS WITHOUT CONTRAST TECHNIQUE: Multidetector CT imaging of the abdomen and pelvis was performed following the standard protocol without IV contrast. RADIATION DOSE REDUCTION: This exam was performed according to the departmental dose-optimization program which includes automated exposure control, adjustment of the mA and/or kV according to patient size and/or use of iterative reconstruction technique. COMPARISON:  MRI 04/13/2024, CT 04/07/2023 FINDINGS: Lower chest: Lung bases demonstrate small bilateral pleural effusions. Cardiomegaly. Differential blood cardiac pool suggesting anemia. Nodular consolidation at the lingula. Mild smooth septal thickening suggestive of mild pulmonary edema with minimal ground-glass disease. Partial atelectasis versus pneumonia at the lower lobes. Hepatobiliary: No focal liver abnormality is seen. Status post cholecystectomy.  No biliary dilatation. Pancreas: Atrophic.  No inflammation Spleen: Normal in size without focal abnormality. Adrenals/Urinary Tract: Adrenal glands are normal. Moderate nonspecific perinephric stranding and small volume fluid. Slightly atrophic right kidney. No hydronephrosis. The bladder is normal Stomach/Bowel: Stomach nonenlarged. Metallic density or clip within the body of the stomach. No acute bowel wall thickening. Diverticula of the colon without acute wall thickening. Surgical anastomosis in the pelvis. Vascular/Lymphatic: Aortic atherosclerosis. No enlarged abdominal or pelvic lymph nodes. Reproductive: Hysterectomy.  No adnexal mass Other: No free air. Small volume free fluid in the pelvis. Generalized  subcutaneous edema. Musculoskeletal: No acute or suspicious osseous abnormality IMPRESSION: 1. Small bilateral pleural effusions with partial atelectasis versus pneumonia at the lower lobes. Mild smooth septal thickening suggestive of mild pulmonary edema. Cardiomegaly. 2. Nodular consolidation at the lingula, atelectasis or focus of pneumonia. Imaging follow-up to resolution is recommended. 3. Moderate nonspecific perinephric stranding and small volume fluid, slightly increased compared to prior, correlate for upper UTI. No hydronephrosis. Slightly atrophic right kidney. 4. Small volume free fluid in the pelvis. Generalized subcutaneous edema. 5. Diverticulosis without acute inflammatory process. 6. Aortic atherosclerosis. Aortic Atherosclerosis (ICD10-I70.0). Electronically Signed   By: Luke Bun M.D.   On: 05/17/2024 23:57   DG Chest 2 View Result Date: 05/17/2024 CLINICAL DATA:  Shortness of breath and chest pain. EXAM: CHEST - 2 VIEW COMPARISON:  03/06/2024 FINDINGS: Lungs are adequately inflated without focal lobar consolidation. Small amount right pleural fluid which is new and possible small amount left pleural fluid. Mild prominence of the central pulmonary vessels which may be due to mild degree of vascular congestion. Cardiomediastinal silhouette and remainder of the exam is unchanged. IMPRESSION: 1. New small right pleural effusion and possible small amount left pleural fluid. 2. Suggestion of mild vascular congestion. Electronically Signed   By: Toribio Agreste M.D.   On: 05/17/2024 15:14   ECHOCARDIOGRAM COMPLETE Result Date: 05/03/2024    ECHOCARDIOGRAM REPORT   Patient Name:   ARYIANNA EARWOOD Vigen Date of Exam: 05/03/2024 Medical Rec #:  [id number]       Height:       62.0 in Accession #:    [id number]      Weight:       180.0 lb Date of Birth:  March 25, 1956       BSA:          1.828 m Patient Age:    68 years        BP:           165/74 mmHg Patient Gender: F               HR:           65 bpm.  Exam Location:  [address] Procedure: 2D Echo, Cardiac Doppler, Color Doppler and 3D Echo (Both Spectral            and Color Flow Doppler were utilized during procedure). Indications:    Dyspnea R06.00  History:        Patient has prior history of Echocardiogram examinations, most                 recent 11/23/2019. CHF, Aortic Valve Disease; Risk                 Factors:Dyslipidemia, Diabetes and Hypertension.  Sonographer:    Augustin Seals RDCS Referring Phys: [id number] IMPRESSIONS  1. Left ventricular ejection fraction by 3D volume is 55 %. The left ventricle has normal  function. The left ventricle has no regional wall motion abnormalities. The left ventricular internal cavity size was mildly dilated. There is mild concentric left  ventricular hypertrophy. Left ventricular diastolic parameters are consistent with Grade I diastolic dysfunction (impaired relaxation).  2. Right ventricular systolic function is normal. The right ventricular size is normal.  3. Left atrial size was severely dilated.  4. The mitral valve is normal in structure. Mild to moderate mitral valve regurgitation. No evidence of mitral stenosis.  5. The aortic valve is normal in structure. Aortic valve regurgitation is not visualized. Mild aortic valve stenosis. Aortic valve area, by VTI measures 1.61 cm. Aortic valve mean gradient measures 17.0 mmHg. Aortic valve Vmax measures 2.72 m/s.  6. The inferior vena cava is dilated in size with <50% respiratory variability, suggesting right atrial pressure of 15 mmHg. FINDINGS  Left Ventricle: Left ventricular ejection fraction by 3D volume is 55 %. The left ventricle has normal function. The left ventricle has no regional wall motion abnormalities. The left ventricular internal cavity size was mildly dilated. There is mild concentric left ventricular hypertrophy. Left ventricular diastolic parameters are consistent with Grade I diastolic dysfunction (impaired relaxation). Right  Ventricle: The right ventricular size is normal. No increase in right ventricular wall thickness. Right ventricular systolic function is normal. Left Atrium: Left atrial size was severely dilated. Right Atrium: Right atrial size was normal in size. Pericardium: There is no evidence of pericardial effusion. Mitral Valve: The mitral valve is normal in structure. Mild mitral annular calcification. Mild to moderate mitral valve regurgitation. No evidence of mitral valve stenosis. Tricuspid Valve: The tricuspid valve is normal in structure. Tricuspid valve regurgitation is mild . No evidence of tricuspid stenosis. Aortic Valve: The aortic valve is normal in structure. Aortic valve regurgitation is not visualized. Mild aortic stenosis is present. Aortic valve mean gradient measures 17.0 mmHg. Aortic valve peak gradient measures 29.5 mmHg. Aortic valve area, by VTI measures 1.61 cm. Pulmonic Valve: The pulmonic valve was normal in structure. Pulmonic valve regurgitation is not visualized. No evidence of pulmonic stenosis. Aorta: The aortic root is normal in size and structure. Venous: The inferior vena cava is dilated in size with less than 50% respiratory variability, suggesting right atrial pressure of 15 mmHg. IAS/Shunts: No atrial level shunt detected by color flow Doppler. Additional Comments: 3D was performed not requiring image post processing on an independent workstation and was normal.  LEFT VENTRICLE PLAX 2D LVIDd:         6.00 cm         Diastology LVIDs:         4.40 cm         LV e' medial:    3.66 cm/s LV PW:         1.20 cm         LV E/e' medial:  29.5 LV IVS:        1.00 cm         LV e' lateral:   3.49 cm/s LVOT diam:     2.50 cm         LV E/e' lateral: 30.9 LV SV:         119 LV SV Index:   65 LVOT Area:     4.91 cm        3D Volume EF  LV 3D EF:    Left                                             ventricul                                             ar                                              ejection                                             fraction                                             by 3D                                             volume is                                             55 %.                                 3D Volume EF:                                3D EF:        55 %                                LV EDV:       183 ml                                LV ESV:       82 ml                                LV SV:        101 ml RIGHT VENTRICLE            IVC RV Basal diam:  3.40 cm    IVC diam: 2.50 cm RV Mid diam:    2.60 cm RV S prime:     9.96 cm/s TAPSE (M-mode): 2.1 cm LEFT ATRIUM             Index        RIGHT ATRIUM  Index LA diam:        4.50 cm 2.46 cm/m   RA Area:     11.60 cm LA Vol (A2C):   75.9 ml 41.52 ml/m  RA Volume:   25.90 ml  14.17 ml/m LA Vol (A4C):   91.7 ml 50.16 ml/m LA Biplane Vol: 87.0 ml 47.59 ml/m  AORTIC VALVE AV Area (Vmax):    1.66 cm AV Area (Vmean):   1.58 cm AV Area (VTI):     1.61 cm AV Vmax:           271.50 cm/s AV Vmean:          196.500 cm/s AV VTI:            0.736 m AV Peak Grad:      29.5 mmHg AV Mean Grad:      17.0 mmHg LVOT Vmax:         91.70 cm/s LVOT Vmean:        63.400 cm/s LVOT VTI:          0.242 m LVOT/AV VTI ratio: 0.33  AORTA Ao Root diam: 3.30 cm Ao Asc diam:  3.30 cm MITRAL VALVE MV Area (PHT): 2.73 cm     SHUNTS MV Decel Time: 278 msec     Systemic VTI:  0.24 m MV E velocity: 108.00 cm/s  Systemic Diam: 2.50 cm MV A velocity: 144.00 cm/s MV E/A ratio:  0.75 Kardie Tobb DO Electronically signed by Dub Huntsman DO Signature Date/Time: 05/03/2024/4:17:30 PM    Final      TODAY-DAY OF DISCHARGE:  Subjective:   Shelly [name] today has no headache,no chest abdominal pain,no new weakness tingling or numbness, feels much better wants to go home today.   Objective:   Blood pressure (!) 148/46, pulse 66, temperature 97.9 F (36.6 C), temperature source Oral, resp. rate 12, height  5' 2 (1.575 m), weight 81.2 kg, SpO2 94%.  Intake/Output Summary (Last 24 hours) at 05/23/2024 0906 Last data filed at 05/23/2024 0436 Gross per 24 hour  Intake 240 ml  Output 2200 ml  Net -1960 ml   Filed Weights   05/21/24 1737 05/22/24 0500 05/23/24 0543  Weight: 84.8 kg 83 kg 81.2 kg    Exam: Awake Alert, Oriented *3, No new F.N deficits, Normal affect [place].AT,PERRAL Supple Neck,No JVD, No cervical lymphadenopathy appriciated.  Symmetrical Chest wall movement, Good air movement bilaterally, CTAB RRR,No Gallops,Rubs or new Murmurs, No Parasternal Heave +ve B.Sounds, Abd Soft, Non tender, No organomegaly appriciated, No rebound -guarding or rigidity. No Cyanosis, Clubbing or edema, No new Rash or bruise   PERTINENT RADIOLOGIC STUDIES: No results found.   PERTINENT LAB RESULTS: CBC: No results for input(s): WBC, HGB, HCT, PLT in the last 72 hours. CMET CMP     Component Value Date/Time   NA 137 05/23/2024 0400   K 4.5 05/23/2024 0400   CL 98 05/23/2024 0400   CO2 26 05/23/2024 0400   GLUCOSE 147 (H) 05/23/2024 0400   BUN 63 (H) 05/23/2024 0400   CREATININE 2.62 (H) 05/23/2024 0400   CALCIUM  8.9 05/23/2024 0400   PROT 6.7 05/17/2024 1556   ALBUMIN 2.8 (L) 05/23/2024 0400   AST 14 (L) 05/17/2024 1556   ALT 11 05/17/2024 1556   ALKPHOS 51 05/17/2024 1556   BILITOT 0.9 05/17/2024 1556   GFR 49.80 (L) 01/06/2018 1220   GFRNONAA 19 (L) 05/23/2024 0400    GFR Estimated Creatinine Clearance: 20.3 mL/min (A) (by C-G formula based on  SCr of 2.62 mg/dL (H)). No results for input(s): LIPASE, AMYLASE in the last 72 hours. No results for input(s): CKTOTAL, CKMB, CKMBINDEX, TROPONINI in the last 72 hours. Invalid input(s): POCBNP No results for input(s): DDIMER in the last 72 hours. No results for input(s): HGBA1C in the last 72 hours. No results for input(s): CHOL, HDL, LDLCALC, TRIG, CHOLHDL, LDLDIRECT in the last 72 hours. No  results for input(s): TSH, T4TOTAL, T3FREE, THYROIDAB in the last 72 hours.  Invalid input(s): FREET3 No results for input(s): VITAMINB12, FOLATE, FERRITIN, TIBC, IRON , RETICCTPCT in the last 72 hours. Coags: No results for input(s): INR in the last 72 hours.  Invalid input(s): PT Microbiology: Recent Results (from the past 240 hours)  Blood culture (routine x 2)     Status: None   Collection Time: 05/17/24  3:54 PM   Specimen: BLOOD LEFT ARM  Result Value Ref Range Status   Specimen Description BLOOD LEFT ARM  Final   Special Requests   Final    BOTTLES DRAWN AEROBIC AND ANAEROBIC Blood Culture adequate volume   Culture   Final    NO GROWTH 5 DAYS Performed at [facility], [address], [place], [place] [address]    Report Status 05/22/2024 FINAL  Final  Blood culture (routine x 2)     Status: None   Collection Time: 05/17/24  4:01 PM   Specimen: BLOOD  Result Value Ref Range Status   Specimen Description BLOOD SITE NOT SPECIFIED  Final   Special Requests   Final    BOTTLES DRAWN AEROBIC AND ANAEROBIC Blood Culture adequate volume   Culture   Final    NO GROWTH 5 DAYS Performed at [facility], [address], [place], [place] [address]    Report Status 05/22/2024 FINAL  Final  Resp panel by RT-PCR (RSV, Flu A&B, Covid) Anterior Nasal Swab     Status: None   Collection Time: 05/17/24  7:19 PM   Specimen: Anterior Nasal Swab  Result Value Ref Range Status   SARS Coronavirus 2 by RT PCR NEGATIVE NEGATIVE Final   Influenza A by PCR NEGATIVE NEGATIVE Final   Influenza B by PCR NEGATIVE NEGATIVE Final    Comment: (NOTE) The Xpert Xpress SARS-CoV-2/FLU/RSV plus assay is intended as an aid in the diagnosis of influenza from Nasopharyngeal swab specimens and should not be used as a sole basis for treatment. Nasal washings and aspirates are unacceptable for Xpert Xpress SARS-CoV-2/FLU/RSV testing.  Fact Sheet for  Patients: bloggercourse.com  Fact Sheet for Healthcare Providers: seriousbroker.it  This test is not yet approved or cleared by the [place]  FDA and has been authorized for detection and/or diagnosis of SARS-CoV-2 by FDA under an Emergency Use Authorization (EUA). This EUA will remain in effect (meaning this test can be used) for the duration of the COVID-19 declaration under Section 564(b)(1) of the Act, 21 U.S.C. section 360bbb-3(b)(1), unless the authorization is terminated or revoked.     Resp Syncytial Virus by PCR NEGATIVE NEGATIVE Final    Comment: (NOTE) Fact Sheet for Patients: bloggercourse.com  Fact Sheet for Healthcare Providers: seriousbroker.it  This test is not yet approved or cleared by the [place]  FDA and has been authorized for detection and/or diagnosis of SARS-CoV-2 by FDA under an Emergency Use Authorization (EUA). This EUA will remain in effect (meaning this test can be used) for the duration of the COVID-19 declaration under Section 564(b)(1) of the Act, 21 U.S.C. section 360bbb-3(b)(1), unless the  authorization is terminated or revoked.  Performed at [facility], [address], [place], [place] [address]   Respiratory (~20 pathogens) panel by PCR     Status: None   Collection Time: 05/17/24  7:19 PM   Specimen: Nasopharyngeal Swab; Respiratory  Result Value Ref Range Status   Adenovirus NOT DETECTED NOT DETECTED Final   Coronavirus 229E NOT DETECTED NOT DETECTED Final    Comment: (NOTE) The Coronavirus on the Respiratory Panel, DOES NOT test for the novel  Coronavirus (2019 nCoV)    Coronavirus HKU1 NOT DETECTED NOT DETECTED Final   Coronavirus NL63 NOT DETECTED NOT DETECTED Final   Coronavirus OC43 NOT DETECTED NOT DETECTED Final   Metapneumovirus NOT DETECTED NOT DETECTED Final   Rhinovirus / Enterovirus NOT DETECTED NOT  DETECTED Final   Influenza A NOT DETECTED NOT DETECTED Final   Influenza B NOT DETECTED NOT DETECTED Final   Parainfluenza Virus 1 NOT DETECTED NOT DETECTED Final   Parainfluenza Virus 2 NOT DETECTED NOT DETECTED Final   Parainfluenza Virus 3 NOT DETECTED NOT DETECTED Final   Parainfluenza Virus 4 NOT DETECTED NOT DETECTED Final   Respiratory Syncytial Virus NOT DETECTED NOT DETECTED Final   Bordetella pertussis NOT DETECTED NOT DETECTED Final   Bordetella Parapertussis NOT DETECTED NOT DETECTED Final   Chlamydophila pneumoniae NOT DETECTED NOT DETECTED Final   Mycoplasma pneumoniae NOT DETECTED NOT DETECTED Final    Comment: Performed at [facility], [address] [place], [place] [address]  Urine Culture (for pregnant, neutropenic or urologic patients or patients with an indwelling urinary catheter)     Status: Abnormal   Collection Time: 05/18/24  8:51 AM   Specimen: Urine, Clean Catch  Result Value Ref Range Status   Specimen Description URINE, CLEAN CATCH  Final   Special Requests   Final    NONE Performed at [facility], [address], [place], [place] [address]    Culture MULTIPLE SPECIES PRESENT, SUGGEST RECOLLECTION (A)  Final   Report Status 05/20/2024 FINAL  Final    FURTHER DISCHARGE INSTRUCTIONS:  Get Medicines reviewed and adjusted: Please take all your medications with you for your next visit with your Primary MD  Laboratory/radiological data: Please request your Primary MD to go over all hospital tests and procedure/radiological results at the follow up, please ask your Primary MD to get all Hospital records sent to his/her office.  In some cases, they will be blood work, cultures and biopsy results pending at the time of your discharge. Please request that your primary care M.D. goes through all the records of your hospital data and follows up on these results.  Also Note the following: If you experience worsening of your admission symptoms,  develop shortness of breath, life threatening emergency, suicidal or homicidal thoughts you must seek medical attention immediately by calling 911 or calling your MD immediately  if symptoms less severe.  You must read complete instructions/literature along with all the possible adverse reactions/side effects for all the Medicines you take and that have been prescribed to you. Take any new Medicines after you have completely understood and accpet all the possible adverse reactions/side effects.   Do not drive when taking Pain medications or sleeping medications (Benzodaizepines)  Do not take more than prescribed Pain, Sleep and Anxiety Medications. It is not advisable to combine anxiety,sleep and pain medications without talking with your primary care practitioner  Special Instructions: If you have smoked or chewed Tobacco  in  the last 2 yrs please stop smoking, stop any regular Alcohol  and or any Recreational drug use.  Wear Seat belts while driving.  Please note: You were cared for by a hospitalist during your hospital stay. Once you are discharged, your primary care physician will handle any further medical issues. Please note that NO REFILLS for any discharge medications will be authorized once you are discharged, as it is imperative that you return to your primary care physician (or establish a relationship with a primary care physician if you do not have one) for your post hospital discharge needs so that they can reassess your need for medications and monitor your lab values.  Total Time spent coordinating discharge including counseling, education and face to face time equals greater than 30 minutes.  SignedBETHA Donalda Applebaum 05/23/2024 9:06 AM

## 2024-05-23 NOTE — Telephone Encounter (Signed)
 Patient given detailed instructions per Myocardial Perfusion Study Information Sheet for the test on 05/25/24 Patient notified to arrive 15 minutes early and that it is imperative to arrive on time for appointment to keep from having the test rescheduled.  If you need to cancel or reschedule your appointment, please call the office within 24 hours of your appointment. . Patient verbalized understanding.Shelly [name]

## 2024-05-23 NOTE — Plan of Care (Signed)
  Problem: Fluid Volume: Goal: Hemodynamic stability will improve Outcome: Progressing   Problem: Clinical Measurements: Goal: Diagnostic test results will improve Outcome: Progressing Goal: Signs and symptoms of infection will decrease Outcome: Progressing   Problem: Respiratory: Goal: Ability to maintain adequate ventilation will improve Outcome: Progressing   Problem: Education: Goal: Ability to describe self-care measures that may prevent or decrease complications (Diabetes Survival Skills Education) will improve Outcome: Progressing Goal: Individualized Educational Video(s) Outcome: Progressing   Problem: Coping: Goal: Ability to adjust to condition or change in health will improve Outcome: Progressing   Problem: Fluid Volume: Goal: Ability to maintain a balanced intake and output will improve Outcome: Progressing   Problem: Health Behavior/Discharge Planning: Goal: Ability to identify and utilize available resources and services will improve Outcome: Progressing Goal: Ability to manage health-related needs will improve Outcome: Progressing   Problem: Metabolic: Goal: Ability to maintain appropriate glucose levels will improve Outcome: Progressing   Problem: Nutritional: Goal: Maintenance of adequate nutrition will improve Outcome: Progressing Goal: Progress toward achieving an optimal weight will improve Outcome: Progressing   Problem: Skin Integrity: Goal: Risk for impaired skin integrity will decrease Outcome: Progressing   Problem: Tissue Perfusion: Goal: Adequacy of tissue perfusion will improve Outcome: Progressing   Problem: Activity: Goal: Ability to tolerate increased activity will improve Outcome: Progressing   Problem: Clinical Measurements: Goal: Ability to maintain a body temperature in the normal range will improve Outcome: Progressing   Problem: Respiratory: Goal: Ability to maintain adequate ventilation will improve Outcome:  Progressing Goal: Ability to maintain a clear airway will improve Outcome: Progressing   Problem: Education: Goal: Knowledge of General Education information will improve Description: Including pain rating scale, medication(s)/side effects and non-pharmacologic comfort measures Outcome: Progressing   Problem: Health Behavior/Discharge Planning: Goal: Ability to manage health-related needs will improve Outcome: Progressing   Problem: Clinical Measurements: Goal: Ability to maintain clinical measurements within normal limits will improve Outcome: Progressing Goal: Will remain free from infection Outcome: Progressing Goal: Diagnostic test results will improve Outcome: Progressing Goal: Respiratory complications will improve Outcome: Progressing Goal: Cardiovascular complication will be avoided Outcome: Progressing   Problem: Activity: Goal: Risk for activity intolerance will decrease Outcome: Progressing   Problem: Nutrition: Goal: Adequate nutrition will be maintained Outcome: Progressing   Problem: Coping: Goal: Level of anxiety will decrease Outcome: Progressing   Problem: Elimination: Goal: Will not experience complications related to bowel motility Outcome: Progressing Goal: Will not experience complications related to urinary retention Outcome: Progressing   Problem: Pain Managment: Goal: General experience of comfort will improve and/or be controlled Outcome: Progressing   Problem: Safety: Goal: Ability to remain free from injury will improve Outcome: Progressing   Problem: Skin Integrity: Goal: Risk for impaired skin integrity will decrease Outcome: Progressing

## 2024-05-24 ENCOUNTER — Other Ambulatory Visit: Payer: Self-pay | Admitting: Cardiology

## 2024-05-24 DIAGNOSIS — R072 Precordial pain: Secondary | ICD-10-CM

## 2024-05-24 DIAGNOSIS — R079 Chest pain, unspecified: Secondary | ICD-10-CM

## 2024-05-25 ENCOUNTER — Inpatient Hospital Stay (HOSPITAL_COMMUNITY): Admit: 2024-05-25 | Discharge: 2024-05-25 | Disposition: A | Attending: Cardiology

## 2024-05-25 DIAGNOSIS — R072 Precordial pain: Secondary | ICD-10-CM | POA: Diagnosis not present

## 2024-05-25 DIAGNOSIS — R079 Chest pain, unspecified: Secondary | ICD-10-CM

## 2024-05-25 LAB — MYOCARDIAL PERFUSION IMAGING
LV dias vol: 203 mL (ref 46–106)
LV sys vol: 139 mL (ref 3.8–5.2)
Nuc Stress EF: 32 %
Peak HR: 85 {beats}/min
Rest HR: 62 {beats}/min
Rest Nuclear Isotope Dose: 9.5 mCi
SDS: 2
SRS: 0
SSS: 2
ST Depression (mm): 0 mm
Stress Nuclear Isotope Dose: 31.8 mCi
TID: 1.18

## 2024-05-25 MED ORDER — REGADENOSON 0.4 MG/5ML IV SOLN
0.4000 mg | Freq: Once | INTRAVENOUS | Status: AC
  Administered 2024-05-25: 0.4 mg via INTRAVENOUS

## 2024-05-25 MED ORDER — TECHNETIUM TC 99M PYROPHOSPHATE
31.8000 | Freq: Once | INTRAVENOUS | Status: AC
  Administered 2024-05-25: 31.8 via INTRAVENOUS

## 2024-05-25 MED ORDER — TECHNETIUM TC 99M TETROFOSMIN IV KIT
9.5000 | PACK | Freq: Once | INTRAVENOUS | Status: AC | PRN
  Administered 2024-05-25: 9.5 via INTRAVENOUS

## 2024-05-25 MED ORDER — REGADENOSON 0.4 MG/5ML IV SOLN
INTRAVENOUS | Status: AC
Start: 2024-05-25 — End: 2024-05-25
  Filled 2024-05-25: qty 5

## 2024-05-26 ENCOUNTER — Ambulatory Visit: Payer: Self-pay | Admitting: Cardiology

## 2024-05-26 NOTE — Telephone Encounter (Signed)
 Patient is returning call.

## 2024-05-26 NOTE — Telephone Encounter (Signed)
 Left the patient a message to discuss imaging results. First attempt.

## 2024-05-30 NOTE — Progress Notes (Unsigned)
 Cardiology Office Note    Date:  05/31/2024  ID:  Shelly [name], DOB Jul 22, 1955, MRN [id number] PCP:  Loreli Kins, MD  Cardiologist:  Annabella Scarce, MD (adv HTN), patient also recently seen in hospital by Dr. Wonda who cared for her family Electrophysiologist:  None   Chief Complaint: f/u CHF  History of Present Illness: .    Shelly [name] is a 68 y.o. female with visit-pertinent history of NICM, chronic HFmrEF, mitral regurgitation, aortic stenosis, HTN, HLD managed by PCP, gastric adenoma, CKD stage IV, anemia by labs, DM, diabetic retinopathy, acid reflux, anxiety, obesity, arthritis, migraines, IBS, endometriosis, melanoma s/p excision.  She was remotely seen by our team when she initially developed acute HFrEF in 2015 with EF 25-30% with moderate-severe MR of unclear etiology. Cath showed no significant CAD, LVEF 20%, moderate pHTN. She was diuresed and started on GDMT. F/u echo 2016 showed EF 50-55%. The patient tells me she was told perhaps she had had a virus that attacked her heart, did not recall being sick before the diagnosis. 2021 echo showed EF 45-50%, G1DD, elevated LVEDP moderate LAE, mild AS, trivial MR. Over the years she has also lost a significant amount of weight. She was previously on carvedilol , Lasix , losartan , spironolactone  when followed in 2016.  She recently re-established care in 03/2024 for severe HTN and dyspnea - see full note for details. She has also been following with nephrology for progressive renal insufficiency. Renal artery duplex 03/2024 showed no significant renal artery stenosis. She reported she was told there may be a tumor on her kidneys contributing to her high blood pressure and abnormal labwork (?pheo eval, had high plasma norepinephrine level of 891 by nephrology). Subsequent MR abdomen 04/13/24 showed no suspicions renal lesions or adrenal lesions. At our visit she presented on carvedilol , doxazosin , hydralazine , and recently started on  clonidine  patch in addition to clonidine  patch by PCP. I ordered echo and arranged close f/u with Advanced [facility] clinic/Dr. Scarce who increased her clonidine  and doxazosin . Due to the recent purchase of clonidine  patches, this was maintained along with oral clonidine  and 3 month f/u. Outpatient 2D echo 05/03/24 showed EF 55%, mild LVH, G1DD, normal RV, mild-moderate MR, mild AS, dilated IVC. She was admitted 11/5-11/11/25 with fever and shortness of breath. Appears etiology of fever not entirely clear, some perinephric stranding and possible nodular consolidation but infection felt less likely. While inpatient she was followed by cardiology team for NSTEMI (peak troponin 91) and acute on chronic HFpEF as well as nephrology for her renal disease. She was diuresed and medications adjusted. Outpatient stress test 11/13/125 showed partially reversible perfusion defect at apex and apical septal wall, consistent with infarct with peri-infarct ischemia, EF 32%, with calcifications in the LAD. Labs also showed anemia during admission, started on iron .  She is seen back for follow-up feeling GREAT. She has made remarkable strides increasing her physical activity. She is very grateful to her care team for helping her feel better. She has not had any chest pain. Discussed stress test and my discussion with Dr. Wonda today. Initial BP 128/50 by CMA felt inaccurate, recheck 158/68. She reports home BPs around 143/60s.   Labwork independently reviewed: 05/2024 alb 2.8, K 4.5, Cr 2.62, Hgb 8.3, plt OK, TFTS ok, AST ALT OK 03/10/24 [facility] DKA by nephrology plasma norepinephrine elevated to 891, epinephrine/dopamine reported within normal limits, plasma normetanephrine and metanephrine reported WNL, PRA 0.635, aldosterone 1.3, cortisol low at 5.8, TSH wnl, Cr 2.34, UA negative  protein, TSH OK, Cr 2.34 03/06/24 low level trops, Mg 1.8, Cr 2.12, alb 2.7, K 4.9, AST ALT OK, BNP 167, Hgb 10.8, plt 233 10/2023 LDL 174, trig  213  ROS: .    Please see the history of present illness.  All other systems are reviewed and otherwise negative.  Studies Reviewed: SABRA    EKG:  EKG is ordered today, personally reviewed, demonstrating:  EKG Interpretation Date/Time:  Wednesday May 31 2024 10:16:07 EST Ventricular Rate:  63 PR Interval:  174 QRS Duration:  98 QT Interval:  438 QTC Calculation: 448 R Axis:   81  Text Interpretation: Normal sinus rhythm Nonspecific ST upsloping II, III, TWI avL Possible prior anterior infarct No acute change from prior Confirmed by Yesmin Mutch [phone]) on 05/31/2024 10:26:04 AM    CV Studies: Cardiac studies reviewed are outlined and summarized above. Otherwise please see EMR for full report.   Current Reported Medications:.    Current Meds  Medication Sig   albuterol  (PROVENTIL ) (2.5 MG/3ML) 0.083% nebulizer solution Take 3 mLs (2.5 mg total) by nebulization every 6 (six) hours as needed for wheezing or shortness of breath.   ALPRAZolam  (XANAX ) 0.5 MG tablet Take 0.5 mg by mouth 3 (three) times daily as needed. Anxiety   aspirin  EC 81 MG tablet Take 1 tablet (81 mg total) by mouth daily. Swallow whole.   carvedilol  (COREG ) 25 MG tablet Take 25 mg by mouth 2 (two) times daily with a meal.   cloNIDine  (CATAPRES  - DOSED IN MG/24 HR) 0.2 mg/24hr patch Place 0.2 mg onto the skin once a week.   COMBIVENT RESPIMAT 20-100 MCG/ACT AERS respimat Take 1 puff by mouth every 6 (six) hours as needed for shortness of breath.   estradiol (ESTRACE) 0.1 MG/GM vaginal cream Place 1 Applicatorful vaginally 2 (two) times a week.   ezetimibe (ZETIA) 10 MG tablet Take 10 mg by mouth daily.   famotidine  (PEPCID ) 40 MG tablet Take 40 mg by mouth at bedtime.   ferrous sulfate 325 (65 FE) MG tablet Take 1 tablet (325 mg total) by mouth daily with breakfast.   fluticasone  (FLONASE ) 50 MCG/ACT nasal spray Place 1 spray into both nostrils daily as needed. For allergy nasal congestion   furosemide   (LASIX ) 40 MG tablet Take 1 tablet (40 mg total) by mouth daily.   hydrALAZINE  (APRESOLINE ) 50 MG tablet Take 1 tablet (50 mg total) by mouth 3 (three) times daily.   HYDROcodone -acetaminophen  (NORCO/VICODIN) 5-325 MG tablet Take 0.5 tablets by mouth every 6 (six) hours as needed for moderate pain (pain score 4-6).   insulin  glargine, 1 Unit Dial, (TOUJEO  SOLOSTAR) 300 UNIT/ML Solostar Pen Inject 2 Units into the skin once a week.   melatonin 5 MG TABS Take 5 mg by mouth at bedtime.   pantoprazole  (PROTONIX ) 40 MG tablet Take 40 mg by mouth daily.   polyethylene glycol powder (GLYCOLAX /MIRALAX ) 17 GM/SCOOP powder Take 17 g by mouth daily. Dissolve 1 capful (17g) in 4-8 ounces of liquid and take by mouth daily.   promethazine  (PHENERGAN ) 25 MG tablet Take 1 tablet (25 mg total) by mouth daily as needed. (Patient taking differently: Take 25 mg by mouth daily as needed for refractory nausea / vomiting.)   senna-docusate (SENOKOT-S) 8.6-50 MG tablet Take 2 tablets by mouth at bedtime.    Physical Exam:    VS:  BP (!) 158/68   Pulse 63   Ht 5' 2 (1.575 m)   Wt 180 lb 4.8 oz (81.8  kg)   SpO2 94%   BMI 32.98 kg/m    Wt Readings from Last 3 Encounters:  05/31/24 180 lb 4.8 oz (81.8 kg)  05/23/24 179 lb (81.2 kg)  04/03/24 180 lb (81.6 kg)    GEN: Well nourished, well developed in no acute distress NECK: No JVD; No carotid bruits CARDIAC: RRR, 2/6 SEM heard over precordium, no rubs or gallops RESPIRATORY:  Clear to auscultation without rales, wheezing or rhonchi  ABDOMEN: Soft, non-tender, non-distended EXTREMITIES:  No edema; No acute deformity   Asessement and Plan:.    1. Elevated troponin, abnormal stress test, coronary calcification - recent admission for elevated troponin/possible NSTEMI treated medically initially in the setting of HF exacerbation and fever of unclear etiology, possibly viral. Reviewed stress test results with Dr. Wonda who followed patient in the hospital recently  - he feels at this juncture would warrant catheterization given recent elevated troponin, evidence for ischemia, coronary calcification in LAD distribution. It is unclear whether the LVEF on the stress test is accurate. If there was a recent true drop from outpatient EF 04/2024, this would further support need for cardiac catheterization. We will obtain a limited echo to evaluate the LV function. Discussed cath in detail with patient. She is interested in pursuing and would first like to see her kidney doctor next week to get their thoughts. I think this is a great plan. She will circle back with us  once they have had that discussion. At time of cath we would plan to hold diuretics and avoid LV gram with close monitoring of her renal function. In the meantime continue medical therapy with ASA 81mg  daily, carvedilol  25mg  BID, ezetimibe 10mg  daily. Note prior intolerance to atorvastatin. Last LDL poorly controlled 10/2023. Will refer to lipid clinic to discuss options for management. Addendum: discussed marked clinical improvement with Dr. Wonda. He is now contemplating whether we need to go directly to cath next - per our discussion, would await echocardiogram to assess LVEF -> if EF is down, will proceed to cath, but if EF has completely normalized, will review updated labs and symptoms to guide whether to continue medical therapy vs cath.  2. Essential HTN - suboptimally controlled by my recheck as well as home values. Will increase hydralazine  slightly to 75mg  TID.  3. Chronic HFmrEF previously NICM with pulmonary HTN at time of initial dx - variable LVEF over the years. EF had recovered by 04/2024 echo but recent stress raised question of recurrent LV dysfunction. Obtain limited echo to clarify LV function. Appears euvolemic today. Will get f/u BMET. Avoid ACEI/ARB/ARNI/spironolactone  for now given CKD. Defer decision regarding SGLT2i to nephrology. Continue present regimen otherwise except titration of  hydralazine  as above.  4. Mild-moderate MR, mild AS - follow clinically, consider recheck echo in 1 year for surveillance of this.  5. CKD stage IV with anemia - recheck CBC with BMET today. I will also obtain a myeloma panel. Appreciate nephrology care of this patient.    Disposition: F/u with nephrology next week as planned, also sees PCP at that time (advised she discuss timing of repeat CT to follow consolidation seen with PCP). F/u gen cards 2 weeks to circle back to cath plans.  Signed, Ival Pacer N Plummer Matich, PA-C

## 2024-05-31 ENCOUNTER — Ambulatory Visit: Attending: Physician Assistant | Admitting: Physician Assistant

## 2024-05-31 ENCOUNTER — Encounter: Payer: Self-pay | Admitting: Physician Assistant

## 2024-05-31 VITALS — BP 158/68 | HR 63 | Ht 62.0 in | Wt 180.3 lb

## 2024-05-31 DIAGNOSIS — R9439 Abnormal result of other cardiovascular function study: Secondary | ICD-10-CM

## 2024-05-31 DIAGNOSIS — I5022 Chronic systolic (congestive) heart failure: Secondary | ICD-10-CM | POA: Diagnosis not present

## 2024-05-31 DIAGNOSIS — D649 Anemia, unspecified: Secondary | ICD-10-CM

## 2024-05-31 DIAGNOSIS — I10 Essential (primary) hypertension: Secondary | ICD-10-CM

## 2024-05-31 DIAGNOSIS — I350 Nonrheumatic aortic (valve) stenosis: Secondary | ICD-10-CM

## 2024-05-31 DIAGNOSIS — I340 Nonrheumatic mitral (valve) insufficiency: Secondary | ICD-10-CM

## 2024-05-31 DIAGNOSIS — I429 Cardiomyopathy, unspecified: Secondary | ICD-10-CM

## 2024-05-31 DIAGNOSIS — N184 Chronic kidney disease, stage 4 (severe): Secondary | ICD-10-CM

## 2024-05-31 DIAGNOSIS — R7989 Other specified abnormal findings of blood chemistry: Secondary | ICD-10-CM

## 2024-05-31 DIAGNOSIS — E782 Mixed hyperlipidemia: Secondary | ICD-10-CM

## 2024-05-31 MED ORDER — HYDRALAZINE HCL 50 MG PO TABS
75.0000 mg | ORAL_TABLET | Freq: Three times a day (TID) | ORAL | 3 refills | Status: AC
Start: 2024-05-31 — End: ?

## 2024-05-31 NOTE — Patient Instructions (Signed)
 Medication Instructions:  Increase hydralazine  to 75 mg, three times daily *If you need a refill on your cardiac medications before your next appointment, please call your pharmacy*  Lab Work: CBC, CMET, Myeloma panel-today If you have labs (blood work) drawn today and your tests are completely normal, you will receive your results only by: MyChart Message (if you have MyChart) OR A paper copy in the mail If you have any lab test that is abnormal or we need to change your treatment, we will call you to review the results.  Testing/Procedures: Your physician has requested that you have an echocardiogram. Echocardiography is a painless test that uses sound waves to create images of your heart. It provides your doctor with information about the size and shape of your heart and how well your heart's chambers and valves are working. This procedure takes approximately one hour. There are no restrictions for this procedure. Please do NOT wear cologne, perfume, aftershave, or lotions (deodorant is allowed). Please arrive 15 minutes prior to your appointment time.  Please note: We ask at that you not bring children with you during ultrasound (echo/ vascular) testing. Due to room size and safety concerns, children are not allowed in the ultrasound rooms during exams. Our front office staff cannot provide observation of children in our lobby area while testing is being conducted. An adult accompanying a patient to their appointment will only be allowed in the ultrasound room at the discretion of the ultrasound technician under special circumstances. We apologize for any inconvenience.   Follow-Up: At [facility], you and your health needs are our priority.  As part of our continuing mission to provide you with exceptional heart care, our providers are all part of one team.  This team includes your primary Cardiologist (physician) and Advanced Practice Providers or APPs (Physician Assistants and  Nurse Practitioners) who all work together to provide you with the care you need, when you need it.  Your next appointment:   2 week(s)  Provider:   Annabella Scarce, MD or Raphael Bring, PA-C  or another APP  Other Instructions You have been referred to see the pharmacist here in our office.

## 2024-06-01 ENCOUNTER — Ambulatory Visit: Payer: Self-pay | Admitting: Physician Assistant

## 2024-06-01 ENCOUNTER — Other Ambulatory Visit: Payer: Self-pay | Admitting: Physician Assistant

## 2024-06-01 DIAGNOSIS — D472 Monoclonal gammopathy: Secondary | ICD-10-CM

## 2024-06-01 DIAGNOSIS — D638 Anemia in other chronic diseases classified elsewhere: Secondary | ICD-10-CM

## 2024-06-01 DIAGNOSIS — I429 Cardiomyopathy, unspecified: Secondary | ICD-10-CM

## 2024-06-01 DIAGNOSIS — E875 Hyperkalemia: Secondary | ICD-10-CM

## 2024-06-01 DIAGNOSIS — I2510 Atherosclerotic heart disease of native coronary artery without angina pectoris: Secondary | ICD-10-CM

## 2024-06-01 DIAGNOSIS — D631 Anemia in chronic kidney disease: Secondary | ICD-10-CM

## 2024-06-01 DIAGNOSIS — N184 Chronic kidney disease, stage 4 (severe): Secondary | ICD-10-CM

## 2024-06-02 ENCOUNTER — Other Ambulatory Visit: Payer: Self-pay | Admitting: Physician Assistant

## 2024-06-02 DIAGNOSIS — E875 Hyperkalemia: Secondary | ICD-10-CM

## 2024-06-02 LAB — COMPREHENSIVE METABOLIC PANEL WITH GFR
ALT: 12 IU/L (ref 0–32)
AST: 13 IU/L (ref 0–40)
Albumin: 3.8 g/dL — ABNORMAL LOW (ref 3.9–4.9)
Alkaline Phosphatase: 86 IU/L (ref 49–135)
BUN/Creatinine Ratio: 24 (ref 12–28)
BUN: 64 mg/dL — ABNORMAL HIGH (ref 8–27)
Bilirubin Total: 0.4 mg/dL (ref 0.0–1.2)
CO2: 26 mmol/L (ref 20–29)
Calcium: 9.3 mg/dL (ref 8.7–10.3)
Chloride: 95 mmol/L — ABNORMAL LOW (ref 96–106)
Creatinine, Ser: 2.63 mg/dL — ABNORMAL HIGH (ref 0.57–1.00)
Globulin, Total: 3.2 g/dL (ref 1.5–4.5)
Glucose: 163 mg/dL — ABNORMAL HIGH (ref 70–99)
Potassium: 5.6 mmol/L — ABNORMAL HIGH (ref 3.5–5.2)
Sodium: 135 mmol/L (ref 134–144)
Total Protein: 7 g/dL (ref 6.0–8.5)
eGFR: 19 mL/min/1.73 — ABNORMAL LOW (ref >59)

## 2024-06-02 LAB — MULTIPLE MYELOMA PANEL, SERUM
Albumin SerPl Elph-Mcnc: 3 g/dL (ref 2.9–4.4)
Albumin/Glob SerPl: 0.8 (ref 0.7–1.7)
Alpha 1: 0.2 g/dL (ref 0.0–0.4)
Alpha2 Glob SerPl Elph-Mcnc: 1 g/dL (ref 0.4–1.0)
B-Globulin SerPl Elph-Mcnc: 1.3 g/dL (ref 0.7–1.3)
Gamma Glob SerPl Elph-Mcnc: 1.5 g/dL (ref 0.4–1.8)
Globulin, Total: 4 g/dL — ABNORMAL HIGH (ref 2.2–3.9)
IgG (Immunoglobin G), Serum: 1382 mg/dL (ref 586–1602)
IgM (Immunoglobulin M), Srm: 296 mg/dL — ABNORMAL HIGH (ref 26–217)
Immunoglobulin A, (IgA) QN, Serum: 537 mg/dL — ABNORMAL HIGH (ref 87–352)
M Protein SerPl Elph-Mcnc: 0.4 g/dL — ABNORMAL HIGH

## 2024-06-02 LAB — CBC
Hematocrit: 32.5 % — ABNORMAL LOW (ref 34.0–46.6)
Hemoglobin: 10.3 g/dL — ABNORMAL LOW (ref 11.1–15.9)
MCH: 29.9 pg (ref 26.6–33.0)
MCHC: 31.7 g/dL (ref 31.5–35.7)
MCV: 94 fL (ref 79–97)
Platelets: 411 x10E3/uL (ref 150–450)
RBC: 3.45 x10E6/uL — ABNORMAL LOW (ref 3.77–5.28)
RDW: 12.1 % (ref 11.7–15.4)
WBC: 9.8 x10E3/uL (ref 3.4–10.8)

## 2024-06-02 LAB — POTASSIUM: Potassium: 5.4 mmol/L — ABNORMAL HIGH (ref 3.5–5.2)

## 2024-06-02 MED ORDER — LOKELMA 10 G PO PACK: 10.0000 g | PACK | Freq: Every day | ORAL | 0 refills | Status: AC

## 2024-06-13 LAB — BASIC METABOLIC PANEL WITH GFR
BUN/Creatinine Ratio: 23 (ref 12–28)
BUN: 51 mg/dL — ABNORMAL HIGH (ref 8–27)
CO2: 21 mmol/L (ref 20–29)
Calcium: 9.4 mg/dL (ref 8.7–10.3)
Chloride: 101 mmol/L (ref 96–106)
Creatinine, Ser: 2.26 mg/dL — ABNORMAL HIGH (ref 0.57–1.00)
Glucose: 145 mg/dL — ABNORMAL HIGH (ref 70–99)
Potassium: 5.2 mmol/L (ref 3.5–5.2)
Sodium: 138 mmol/L (ref 134–144)
eGFR: 23 mL/min/1.73 — ABNORMAL LOW (ref >59)

## 2024-06-13 NOTE — Telephone Encounter (Signed)
 I am going to send a result note on a lab that came back on patient, saw she sent msg below re: dental work, will reply on this in the result note all in one communication.

## 2024-06-14 NOTE — Telephone Encounter (Signed)
 Provider replied to patient's message via MyChart message under lab results. Closing this encounter.

## 2024-06-19 NOTE — Progress Notes (Unsigned)
 Cardiology Office Note    Date:  06/19/2024  ID:  Shelly [name], DOB 08/02/1955, MRN [id number] PCP:  Loreli Kins, MD  Cardiologist:  Annabella Scarce, MD  Electrophysiologist:  None   Chief Complaint: ***  History of Present Illness: .    Shelly [name] is a 68 y.o. female with visit-pertinent history of prior NICM, chronic HFmrEF, mitral regurgitation, aortic stenosis, HTN, HLD, gastric adenoma, CKD stage IV, anemia, DM, diabetic retinopathy, acid reflux, anxiety, obesity, arthritis, migraines, IBS, endometriosis, melanoma s/p excision.   She was remotely seen by our team when she initially developed acute HFrEF in 2015 with EF 25-30% with moderate-severe MR of unclear etiology. Cath showed no significant CAD, LVEF 20%, moderate pHTN. She was diuresed and started on GDMT. F/u echo 2016 showed EF 50-55%. The patient tells me she was told perhaps she had had a virus that attacked her heart, did not recall being sick before the diagnosis. 2021 echo showed EF 45-50%, G1DD, elevated LVEDP moderate LAE, mild AS, trivial MR. Over the years she has also lost a significant amount of weight. She was previously on carvedilol , Lasix , losartan , spironolactone  when followed in 2016.   She recently re-established care in 03/2024 for severe HTN and dyspnea - see full note for details. She has also been following with nephrology for progressive renal insufficiency. Renal artery duplex 03/2024 showed no significant renal artery stenosis. She reported she was told there may be a tumor on her kidneys contributing to her high blood pressure and abnormal labwork (?pheo eval, had high plasma norepinephrine level of 891 by nephrology). Subsequent MR abdomen 04/13/24 showed no suspicions renal lesions or adrenal lesions. At our visit she presented on carvedilol , doxazosin , hydralazine , and recently started on clonidine  patch in addition to clonidine  patch by PCP. I ordered echo and arranged close f/u with [facility]/Dr. Scarce who increased her clonidine  and doxazosin . Due to the recent purchase of clonidine  patches, this was maintained along with oral clonidine  and 3 month f/u. Outpatient 2D echo 05/03/24 showed EF 55%, mild LVH, G1DD, normal RV, mild-moderate MR, mild AS, dilated IVC.   She was admitted 11/5-11/11/25 with fever and shortness of breath. Etiology of fever was not entirely clear, some perinephric stranding and possible nodular consolidation but infection felt less likely. While inpatient she was followed by cardiology team for NSTEMI (peak troponin 910) and acute on chronic HFpEF. Nephrology also followed along. She was diuresed and medications adjusted. Outpatient stress test 11/13/125 showed partially reversible perfusion defect at apex and apical septal wall, consistent with infarct with peri-infarct ischemia, EF 32%, with calcifications in the LAD. Labs also showed anemia during admission, started on iron . Dr. Wonda followed her while inpatient - he also took care of one of her family members remotely. I had discussed case with Dr. Wonda in follow-up 05/31/24 - initially there was consideration that she would need to pursue cardiac cath, however, given dramatic improvement in symptoms, Dr. Wonda recommended medical management with repeat limited echo to evaluate LV function. If this were truly down as suggested in stress test, would pursue cath, but if reassuring, could continue with medical management. Follow-up labs notable for marginal M spike (referred to hematology) and hyperkalemia requiring Lokelma . She was also referred to lipid clinic.  See last note Kappa light chains up  Elevated troponin/abnormal stress test, coronary calcification Essential HTN Chronic HFmrEF previously NICM with pulmonary HTN  Mild-moderate MR, mild AS CKD stage IV with anemia   Labwork  independently reviewed: 06/2024 Cr 2.26, K 5.2, alb 3.8, AST ALT OK (see EMR for additional K/Cr levels) 05/2024  alb 2.8, K 4.5, Cr 2.62, Hgb 8.3, plt OK, TFTS ok, AST ALT OK 03/10/24 [facility] DKA by nephrology plasma norepinephrine elevated to 891, epinephrine/dopamine reported within normal limits, plasma normetanephrine and metanephrine reported WNL, PRA 0.635, aldosterone 1.3, cortisol low at 5.8, TSH wnl, Cr 2.34, UA negative protein, TSH OK, Cr 2.34 03/06/24 low level trops, Mg 1.8, Cr 2.12, alb 2.7, K 4.9, AST ALT OK, BNP 167, Hgb 10.8, plt 233 10/2023 LDL 174, trig 213   ROS: .    Please see the history of present illness. Otherwise, review of systems is positive for ***.  All other systems are reviewed and otherwise negative.  Studies Reviewed: SABRA    EKG:  EKG is ordered today, personally reviewed, demonstrating ***  CV Studies: Cardiac studies reviewed are outlined and summarized above. Otherwise please see EMR for full report.   Current Reported Medications:.    No outpatient medications have been marked as taking for the 06/21/24 encounter (Appointment) with Brecken Walth N, PA-C.    Physical Exam:    VS:  There were no vitals taken for this visit.   Wt Readings from Last 3 Encounters:  05/31/24 180 lb 4.8 oz (81.8 kg)  05/23/24 179 lb (81.2 kg)  04/03/24 180 lb (81.6 kg)    GEN: Well nourished, well developed in no acute distress NECK: No JVD; No carotid bruits CARDIAC: ***RRR, no murmurs, rubs, gallops RESPIRATORY:  Clear to auscultation without rales, wheezing or rhonchi  ABDOMEN: Soft, non-tender, non-distended EXTREMITIES:  No edema; No acute deformity   Asessement and Plan:.     ***     Disposition: F/u with ***  Signed, Isaias Dowson N Terryl Molinelli, PA-C

## 2024-06-21 ENCOUNTER — Ambulatory Visit: Attending: Physician Assistant | Admitting: Physician Assistant

## 2024-06-21 ENCOUNTER — Encounter: Payer: Self-pay | Admitting: Physician Assistant

## 2024-06-21 ENCOUNTER — Ambulatory Visit: Admitting: Physician Assistant

## 2024-06-21 VITALS — BP 132/60 | HR 68 | Ht 62.0 in | Wt 178.0 lb

## 2024-06-21 DIAGNOSIS — I2510 Atherosclerotic heart disease of native coronary artery without angina pectoris: Secondary | ICD-10-CM

## 2024-06-21 DIAGNOSIS — I10 Essential (primary) hypertension: Secondary | ICD-10-CM | POA: Diagnosis not present

## 2024-06-21 DIAGNOSIS — I350 Nonrheumatic aortic (valve) stenosis: Secondary | ICD-10-CM

## 2024-06-21 DIAGNOSIS — I340 Nonrheumatic mitral (valve) insufficiency: Secondary | ICD-10-CM

## 2024-06-21 DIAGNOSIS — D649 Anemia, unspecified: Secondary | ICD-10-CM

## 2024-06-21 DIAGNOSIS — R7989 Other specified abnormal findings of blood chemistry: Secondary | ICD-10-CM | POA: Diagnosis not present

## 2024-06-21 DIAGNOSIS — I428 Other cardiomyopathies: Secondary | ICD-10-CM

## 2024-06-21 DIAGNOSIS — N184 Chronic kidney disease, stage 4 (severe): Secondary | ICD-10-CM | POA: Diagnosis not present

## 2024-06-21 DIAGNOSIS — E875 Hyperkalemia: Secondary | ICD-10-CM

## 2024-06-21 DIAGNOSIS — I5022 Chronic systolic (congestive) heart failure: Secondary | ICD-10-CM | POA: Diagnosis not present

## 2024-06-21 LAB — BASIC METABOLIC PANEL WITH GFR
BUN/Creatinine Ratio: 24 (ref 12–28)
BUN: 61 mg/dL — ABNORMAL HIGH (ref 8–27)
CO2: 21 mmol/L (ref 20–29)
Calcium: 9 mg/dL (ref 8.7–10.3)
Chloride: 98 mmol/L (ref 96–106)
Creatinine, Ser: 2.5 mg/dL — ABNORMAL HIGH (ref 0.57–1.00)
Glucose: 132 mg/dL — ABNORMAL HIGH (ref 70–99)
Potassium: 4.5 mmol/L (ref 3.5–5.2)
Sodium: 135 mmol/L (ref 134–144)
eGFR: 20 mL/min/1.73 — ABNORMAL LOW (ref >59)

## 2024-06-21 LAB — CBC
Hematocrit: 32.5 % — ABNORMAL LOW (ref 34.0–46.6)
Hemoglobin: 10.6 g/dL — ABNORMAL LOW (ref 11.1–15.9)
MCH: 30.5 pg (ref 26.6–33.0)
MCHC: 32.6 g/dL (ref 31.5–35.7)
MCV: 93 fL (ref 79–97)
Platelets: 298 x10E3/uL (ref 150–450)
RBC: 3.48 x10E6/uL — ABNORMAL LOW (ref 3.77–5.28)
RDW: 12.4 % (ref 11.7–15.4)
WBC: 9.6 x10E3/uL (ref 3.4–10.8)

## 2024-06-21 NOTE — Patient Instructions (Signed)
 Medication Instructions:  NO CHANGES  Lab Work: BMET AND CBC TO BE DONE TODAY.  Testing/Procedures: NONE  Follow-Up: At [facility], you and your health needs are our priority.  As part of our continuing mission to provide you with exceptional heart care, our providers are all part of one team.  This team includes your primary Cardiologist (physician) and Advanced Practice Providers or APPs (Physician Assistants and Nurse Practitioners) who all work together to provide you with the care you need, when you need it.  Your next appointment:   KEEP ALL FOLLOW UP APPOINTMENT WITH DR. [place], HEMATOLOGY, AND LIPID CLINIC AS SCHEDULED  Provider:   Annabella Scarce, MD

## 2024-06-22 ENCOUNTER — Ambulatory Visit: Payer: Self-pay | Admitting: Physician Assistant

## 2024-06-28 ENCOUNTER — Encounter (HOSPITAL_BASED_OUTPATIENT_CLINIC_OR_DEPARTMENT_OTHER): Payer: Self-pay | Admitting: Cardiovascular Disease

## 2024-06-28 ENCOUNTER — Ambulatory Visit (HOSPITAL_BASED_OUTPATIENT_CLINIC_OR_DEPARTMENT_OTHER): Admitting: Cardiovascular Disease

## 2024-06-28 VITALS — BP 118/70 | HR 63 | Ht 62.0 in | Wt 183.4 lb

## 2024-06-28 DIAGNOSIS — I130 Hypertensive heart and chronic kidney disease with heart failure and stage 1 through stage 4 chronic kidney disease, or unspecified chronic kidney disease: Secondary | ICD-10-CM | POA: Diagnosis not present

## 2024-06-28 DIAGNOSIS — N184 Chronic kidney disease, stage 4 (severe): Secondary | ICD-10-CM | POA: Diagnosis not present

## 2024-06-28 DIAGNOSIS — I10 Essential (primary) hypertension: Secondary | ICD-10-CM | POA: Diagnosis not present

## 2024-06-28 DIAGNOSIS — I214 Non-ST elevation (NSTEMI) myocardial infarction: Secondary | ICD-10-CM

## 2024-06-28 DIAGNOSIS — I5022 Chronic systolic (congestive) heart failure: Secondary | ICD-10-CM

## 2024-06-28 MED ORDER — HYDRALAZINE HCL 50 MG PO TABS: ORAL_TABLET | ORAL | 3 refills | Status: DC

## 2024-06-28 NOTE — Progress Notes (Signed)
 [facility] Initial Assessment:    Date:  06/28/2024   ID:  Shelly [name], DOB July 13, 1956, MRN [id number]  PCP:  Shelly Kins, MD  Cardiologist:  Shelly Scarce, MD  Nephrologist:  Referring MD: Shelly Kins, MD   CC: Hypertension  History of Present Illness:    Shelly [name] is a 68 y.o. female with a hx of HFmrEF, NICM, hypertension, HL, aortic stenosis, stage IV CKD, gastric adenoma, DM, and anemia here for follow up. She was first seen 03/2024 to establish care in the [facility].  She developed HFrEF in 2015.  LVEF 25-30% with moderate-severe MR.  No significant CAD on cath.  She started on GDMT with improvement in LVEF 50-55%.  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  LVEF worsened to 45-50% in 2021.    BP was noted to be SBP 245 in the setting of a melanoma excision.  Carvedilol  was increased and BP remained very elevated.  Her nephrologist added hydralazine .  PCP starterd a clonidine  patch the day before she saw Shelly Dunn, PA-C in clinic 03/30/24.  Renal Doppers were negative.  Home BP was in the 170s.90 at the tine,  Per Shelly's note:  03/10/24 [facility] DKA by nephrology plasma norepinephrine elevated to 891, epinephrine/dopamine reported within normal limits, plasma normetanephrine and metanephrine reported WNL, PRA 0.635, aldosterone 1.3, cortisol low at 5.8, TSH wnl, Cr 2.34, UA negative protein, TSH OK, Cr 2.34 03/06/24 low level trops, Mg 1.8, Cr 2.12, alb 2.7, K 4.9, AST ALT OK, BNP 167, Hgb 10.8, plt 233  Shelly [name] reported exertional dyspnea and fatigue.  An echo was ordered and not yet performed.  At her visit 03/2024 BP has increased to 170/90 from the 120s over the course of a couple months.  She was exercising regularly.  She also noted increased anxiety in that time period.  Clonidine  and doxazosin  were increased.  Renal Dopplers were negative.  Norepinephrine was elevated.  MRI abdomen was negative for adrenal  nodules. She was admitted 05/2024 with fever and shortness of breath.  She also had NSTEMI with acute on chronic HFpEF.  She was diuresed and had a Lexiscan  Myoview  that revealed partially reversible defect in the apex and apical septum consistent with infarct and peri-infarct ischemia.  LVEF 32%.    Discussed the use of AI scribe software for clinical note transcription with the patient, who gave verbal consent to proceed.  History of Present Illness Shelly [name] experiences dizziness and a sense of imbalance, which she attributes to her medication regimen. Lethargy is particularly noticeable after taking her morning medications, leading to an unusual need for naps. She is currently taking clonidine  and hydralazine , among other medications. She reports feeling 'nappy' after taking her midday dose of hydralazine .  She has a history of hypertension, which she monitors at home. Her recent blood pressure readings have been around 134/68 mmHg, showing improvement from previous levels. She experiences nausea, which has persisted since her recent hospitalization for a viral illness characterized by headache and fever. During this time, she also experienced nausea and dizziness, which have continued.  She has a family history of heart disease, with her father having multiple heart attacks, some asymptomatic. She has experienced symptoms such as jaw and shoulder pain, prompting emergency care in the past.  She maintains a level of physical activity, aiming to walk about 5,000 steps a day, and uses a watch to remind her to move every hour. Despite feeling lethargic,  she tries to stay active. She has an upcoming echocardiogram scheduled and is seeing a hematologist soon.  Previous antihypertensives: Amlodipine  Lisinopril   Past Medical History:  Diagnosis Date   Acid reflux    Anemia    Anxiety    Arthritis    Asthma    infrequent problems   Cardiomyopathy (HCC)    CHF (congestive heart failure)  (HCC)    Chronic UTI (urinary tract infection)    Complication of anesthesia    I quit breathing during surgery 2008 - has had surgery since with no problems    Diabetes mellitus    Endometriosis    hx of    Headache    hx of migraines   Hepatitis B    hx of dx during ruptured appendix hospitalization   Hyperlipemia    Hypertension    IBS (irritable bowel syndrome)    Irritable bowel syndrome    Kidney disease    Obesity    Pneumonia 12/2013   Pneumonia    PONV (postoperative nausea and vomiting)    Rash    sides of neck   Stress incontinence    Tuberculosis 1985    Past Surgical History:  Procedure Laterality Date   ABDOMINAL ADHESION SURGERY  1989   x 7   ABDOMINAL HYSTERECTOMY  01/1996   APPENDECTOMY  1987   BREAST BIOPSY Right 05/18/2022   MM RT BREAST BX W LOC DEV 1ST LESION IMAGE BX SPEC STEREO GUIDE 05/18/2022 [facility]   CHOLECYSTECTOMY  11/24/2000   COLON SURGERY  2010   colon resection due to adhesions   LAPAROSCOPIC LYSIS OF ADHESIONS  07/1991   pelvic adhesions   LEFT AND RIGHT HEART CATHETERIZATION WITH CORONARY ANGIOGRAM N/A 06/29/2014   Procedure: LEFT AND RIGHT HEART CATHETERIZATION WITH CORONARY ANGIOGRAM;  Surgeon: Shelly GORMAN Reek, MD;  Location: [facility] C[facility];  Service: Cardiovascular;  Laterality: N/A;   OOPHORECTOMY Right 04/1995   OOPHORECTOMY Left 12/1992   removed left ovary   PARTIAL KNEE ARTHROPLASTY Left 10/27/2013   Procedure: LEFT KNEE UNICOMPARTMENTAL REPLACEMENT ;  Surgeon: Shelly [name] Moan, MD;  Location: [facility];  Service: Orthopedics;  Laterality: Left;   PARTIAL KNEE ARTHROPLASTY Right 05/21/2014   Procedure: MEDIAL UNICOMPARTMENTAL RIGHT KNEE ARTHROPLASTY;  Surgeon: Shelly [name] LULLA, MD;  Location: [facility];  Service: Orthopedics;  Laterality: Right;   TONSILLECTOMY      Current Medications: Current Meds  Medication Sig   albuterol  (PROVENTIL ) (2.5 MG/3ML) 0.083% nebulizer solution Take 3 mLs (2.5 mg total) by  nebulization every 6 (six) hours as needed for wheezing or shortness of breath.   ALPRAZolam  (XANAX ) 0.5 MG tablet Take 0.5 mg by mouth 3 (three) times daily as needed. Anxiety   aspirin  EC 81 MG tablet Take 1 tablet (81 mg total) by mouth daily. Swallow whole.   carvedilol  (COREG ) 25 MG tablet Take 25 mg by mouth 2 (two) times daily with a meal.   cloNIDine  (CATAPRES  - DOSED IN MG/24 HR) 0.2 mg/24hr patch Place 0.2 mg onto the skin once a week.   COMBIVENT RESPIMAT 20-100 MCG/ACT AERS respimat Take 1 puff by mouth every 6 (six) hours as needed for shortness of breath.   estradiol (ESTRACE) 0.1 MG/GM vaginal cream Place 1 Applicatorful vaginally 2 (two) times a week.   ezetimibe  (ZETIA ) 10 MG tablet Take 10 mg by mouth daily.   famotidine  (PEPCID ) 40 MG tablet Take 40 mg by mouth at bedtime.   ferrous sulfate  325 (65  FE) MG tablet Take 1 tablet (325 mg total) by mouth daily with breakfast.   fluticasone  (FLONASE ) 50 MCG/ACT nasal spray Place 1 spray into both nostrils daily as needed. For allergy nasal congestion   furosemide  (LASIX ) 40 MG tablet Take 1 tablet (40 mg total) by mouth daily.   HYDROcodone -acetaminophen  (NORCO/VICODIN) 5-325 MG tablet Take 0.5 tablets by mouth every 6 (six) hours as needed for moderate pain (pain score 4-6).   insulin  glargine, 1 Unit Dial, (TOUJEO  SOLOSTAR) 300 UNIT/ML Solostar Pen Inject 2 Units into the skin once a week.   melatonin 5 MG TABS Take 5 mg by mouth at bedtime.   pantoprazole  (PROTONIX ) 40 MG tablet Take 40 mg by mouth daily.   polyethylene glycol powder (GLYCOLAX /MIRALAX ) 17 GM/SCOOP powder Take 17 g by mouth daily. Dissolve 1 capful (17g) in 4-8 ounces of liquid and take by mouth daily.   promethazine  (PHENERGAN ) 25 MG tablet Take 1 tablet (25 mg total) by mouth daily as needed. (Patient taking differently: Take 25 mg by mouth daily as needed for refractory nausea / vomiting.)   senna-docusate (SENOKOT-S) 8.6-50 MG tablet Take 2 tablets by mouth at  bedtime.   [DISCONTINUED] hydrALAZINE  (APRESOLINE ) 50 MG tablet Take 1.5 tablets (75 mg total) by mouth 3 (three) times daily.     Allergies:   Singulair  [montelukast  sodium], Amlodipine  besylate, Atorvastatin, Lisinopril, Metformin, Penicillins, Sulfa antibiotics, Welchol  [colesevelam hcl], Dilaudid  [hydromorphone  hcl], and Sulfamethoxazole-trimethoprim   Social History   Socioeconomic History   Marital status: Divorced    Spouse name: Not on file   Number of children: Not on file   Years of education: Not on file   Highest education level: Not on file  Occupational History   Not on file  Tobacco Use   Smoking status: Never   Smokeless tobacco: Never  Substance and Sexual Activity   Alcohol use: Yes    Comment: occ   Drug use: No   Sexual activity: Not on file  Other Topics Concern   Not on file  Social History Narrative   Not on file   Social Drivers of Health   Tobacco Use: Low Risk (06/28/2024)   Patient History    Smoking Tobacco Use: Never    Smokeless Tobacco Use: Never    Passive Exposure: Not on file  Financial Resource Strain: Not on file  Food Insecurity: No Food Insecurity (05/18/2024)   Epic    Worried About Programme Researcher, Broadcasting/film/video in the Last Year: Never true    Ran Out of Food in the Last Year: Never true  Transportation Needs: No Transportation Needs (05/18/2024)   Epic    Lack of Transportation (Medical): No    Lack of Transportation (Non-Medical): No  Physical Activity: Not on file  Stress: Not on file  Social Connections: Moderately Isolated (05/18/2024)   Social Connection and Isolation Panel    Frequency of Communication with Friends and Family: More than three times a week    Frequency of Social Gatherings with Friends and Family: More than three times a week    Attends Religious Services: More than 4 times per year    Active Member of [employer] or Organizations: No    Attends Banker Meetings: Never    Marital Status: Divorced   Depression (PHQ2-9): Not on file  Alcohol Screen: Not on file  Housing: Low Risk (05/18/2024)   Epic    Unable to Pay for Housing in the Last Year: No  Number of Times Moved in the Last Year: 0    Homeless in the Last Year: No  Utilities: Not At Risk (05/18/2024)   Epic    Threatened with loss of utilities: No  Health Literacy: Not on file     Family History: The patient's family history includes Diabetes in her father; Heart attack in her maternal grandmother and paternal grandmother; Heart disease in her father; Heart failure in her mother; Hypertension in her maternal grandmother and mother; Liver cancer in her brother; Stroke in her mother. There is no history of Breast cancer.  ROS:   Please see the history of present illness.     All other systems reviewed and are negative.  EKGs/Labs/Other Studies Reviewed:    EKG:  EKG is no ordered today.   Recent Labs: 05/17/2024: B Natriuretic Peptide 740.5; TSH 0.791 05/19/2024: Magnesium  2.0 05/31/2024: ALT 12 06/21/2024: BUN 61; Creatinine, Ser 2.50; Hemoglobin 10.6; Platelets 298; Potassium 4.5; Sodium 135   Recent Lipid Panel No results found for: CHOL, TRIG, HDL, CHOLHDL, VLDL, LDLCALC, LDLDIRECT  Physical Exam:   VS:  BP 118/70   Pulse 63   Ht 5' 2 (1.575 m)   Wt 183 lb 6.4 oz (83.2 kg)   SpO2 97%   BMI 33.54 kg/m  , BMI Body mass index is 33.54 kg/m. GENERAL:  Well appearing HEENT: Pupils equal round and reactive, fundi not visualized, oral mucosa unremarkable NECK:  No jugular venous distention, waveform within normal limits, carotid upstroke brisk and symmetric, no bruits, no thyromegaly LUNGS:  Clear to auscultation bilaterally HEART:  RRR.  PMI not displaced or sustained,S1 and S2 within normal limits, no S3, no S4, no clicks, no rubs, III/VI systolic murmur at the LUSB with radiation to both carotids. ABD:  Flat, positive bowel sounds normal in frequency in pitch, no bruits, no rebound, no  guarding, no midline pulsatile mass, no hepatomegaly, no splenomegaly EXT:  2 plus pulses throughout, no edema, no cyanosis no clubbing SKIN:  No rashes no nodules NEURO:  Cranial nerves II through XII grossly intact, motor grossly intact throughout PSYCH:  Cognitively intact, oriented to person place and time   ASSESSMENT/PLAN:    Assessment & Plan # Hypertensive heart disease with chronic systolic heart failure and stage 4 chronic kidney disease Blood pressure controlled at 118/70 mmHg. Dizziness and fatigue possibly due to clonidine , carvedilol , and hydralazine . Echocardiogram pending. Catheterization deferred due to stable heart function and kidney risks. - Reduced afternoon hydralazine  dose to 25 mg. - Continue to monitor blood pressure and symptoms. - Await echocardiogram results on December 26th. - Deferred catheterization unless symptoms develop.  # Primary hypertension Blood pressure well-controlled. Dizziness and fatigue possibly due to clonidine , carvedilol , and hydralazine . - Reduced afternoon hydralazine  dose to 25 mg. - Monitor blood pressure and symptoms.  # History of NSTEMI No current chest pain or shortness of breath. Echocardiogram will assess heart function. Medical management preferred unless symptoms or high-risk blockages present.  Consider if systolic function is persistently reduced. - Await echocardiogram results on December 26th. - Continue medical management unless symptoms develop. - Continue aspirin , carvedilol , and Zetia .  #  Aortic stenosis:  This has been mild.  Echo pending as above.    Screening for Secondary Hypertension:     04/03/2024    8:27 AM  Causes  Drugs/Herbals Screened     - Comments limits salt.  1 coffee daily.  No NSAIDS.  Renovascular HTN Screened  Sleep Apnea Screened     -  Comments no snoring or daytime somnolence  Thyroid  Disease Screened  Hyperaldosteronism Screened  Pheochromocytoma Screened     - Comments further  evaluation pending with MRI and urine labs  Cushing's Syndrome Screened  Hyperparathyroidism Screened  Coarctation of the Aorta Screened     - Comments BP symmetric  Compliance Screened    Relevant Labs/Studies:    Latest Ref Rng & Units 06/21/2024   10:36 AM 06/12/2024    2:58 PM 06/01/2024   10:41 AM  Basic Labs  Sodium 134 - 144 mmol/L 135  138    Potassium 3.5 - 5.2 mmol/L 4.5  5.2  5.4   Creatinine 0.57 - 1.00 mg/dL 7.49  7.73         Latest Ref Rng & Units 05/17/2024    8:40 PM 01/06/2018   12:20 PM  Thyroid    TSH 0.350 - 4.500 uIU/mL 0.791  1.11                     Disposition:    FU with MD/PharmD in 3 months    Medication Adjustments/Labs and Tests Ordered: Current medicines are reviewed at length with the patient today.  Concerns regarding medicines are outlined above.  No orders of the defined types were placed in this encounter.  Meds ordered this encounter  Medications   hydrALAZINE  (APRESOLINE ) 50 MG tablet    Sig: TAKE 1 AND 1/2 TABLETS EVERY MORNING AND EVENING AND 1/2 TABLET MIDDAY    Dispense:  405 tablet    Refill:  3     Signed, Shelly Scarce, MD  06/28/2024 2:01 PM    [facility]

## 2024-06-28 NOTE — Patient Instructions (Addendum)
 Medication Instructions:  DECREASE YOUR AFTERNOON ONLY HYDRALAZINE  TO 25 MG   Labwork: NONE  Testing/Procedures: NONE  Follow-Up: 2 TO 3 MONTHS WITH CAITLIN W NP OR DR Radford   If you need a refill on your cardiac medications before your next appointment, please call your pharmacy.

## 2024-07-03 ENCOUNTER — Inpatient Hospital Stay

## 2024-07-03 ENCOUNTER — Inpatient Hospital Stay: Attending: Nurse Practitioner | Admitting: Nurse Practitioner

## 2024-07-03 VITALS — HR 69 | Temp 97.7°F | Resp 18 | Ht 62.0 in | Wt 190.8 lb

## 2024-07-03 DIAGNOSIS — K589 Irritable bowel syndrome without diarrhea: Secondary | ICD-10-CM | POA: Diagnosis not present

## 2024-07-03 DIAGNOSIS — Z882 Allergy status to sulfonamides status: Secondary | ICD-10-CM | POA: Insufficient documentation

## 2024-07-03 DIAGNOSIS — I214 Non-ST elevation (NSTEMI) myocardial infarction: Secondary | ICD-10-CM | POA: Insufficient documentation

## 2024-07-03 DIAGNOSIS — Z888 Allergy status to other drugs, medicaments and biological substances status: Secondary | ICD-10-CM | POA: Insufficient documentation

## 2024-07-03 DIAGNOSIS — Z823 Family history of stroke: Secondary | ICD-10-CM | POA: Insufficient documentation

## 2024-07-03 DIAGNOSIS — J440 Chronic obstructive pulmonary disease with acute lower respiratory infection: Secondary | ICD-10-CM | POA: Insufficient documentation

## 2024-07-03 DIAGNOSIS — E1122 Type 2 diabetes mellitus with diabetic chronic kidney disease: Secondary | ICD-10-CM | POA: Diagnosis not present

## 2024-07-03 DIAGNOSIS — Z9049 Acquired absence of other specified parts of digestive tract: Secondary | ICD-10-CM | POA: Insufficient documentation

## 2024-07-03 DIAGNOSIS — E785 Hyperlipidemia, unspecified: Secondary | ICD-10-CM | POA: Diagnosis not present

## 2024-07-03 DIAGNOSIS — R7989 Other specified abnormal findings of blood chemistry: Secondary | ICD-10-CM | POA: Insufficient documentation

## 2024-07-03 DIAGNOSIS — I130 Hypertensive heart and chronic kidney disease with heart failure and stage 1 through stage 4 chronic kidney disease, or unspecified chronic kidney disease: Secondary | ICD-10-CM | POA: Insufficient documentation

## 2024-07-03 DIAGNOSIS — Z880 Allergy status to penicillin: Secondary | ICD-10-CM | POA: Insufficient documentation

## 2024-07-03 DIAGNOSIS — D649 Anemia, unspecified: Secondary | ICD-10-CM | POA: Diagnosis not present

## 2024-07-03 DIAGNOSIS — Z90710 Acquired absence of both cervix and uterus: Secondary | ICD-10-CM | POA: Insufficient documentation

## 2024-07-03 DIAGNOSIS — Z79899 Other long term (current) drug therapy: Secondary | ICD-10-CM | POA: Insufficient documentation

## 2024-07-03 DIAGNOSIS — Z7982 Long term (current) use of aspirin: Secondary | ICD-10-CM | POA: Insufficient documentation

## 2024-07-03 DIAGNOSIS — Z87440 Personal history of urinary (tract) infections: Secondary | ICD-10-CM | POA: Diagnosis not present

## 2024-07-03 DIAGNOSIS — Z885 Allergy status to narcotic agent status: Secondary | ICD-10-CM | POA: Diagnosis not present

## 2024-07-03 DIAGNOSIS — I252 Old myocardial infarction: Secondary | ICD-10-CM | POA: Diagnosis not present

## 2024-07-03 DIAGNOSIS — Z90721 Acquired absence of ovaries, unilateral: Secondary | ICD-10-CM | POA: Insufficient documentation

## 2024-07-03 DIAGNOSIS — Z8701 Personal history of pneumonia (recurrent): Secondary | ICD-10-CM | POA: Insufficient documentation

## 2024-07-03 DIAGNOSIS — N184 Chronic kidney disease, stage 4 (severe): Secondary | ICD-10-CM | POA: Diagnosis not present

## 2024-07-03 DIAGNOSIS — J189 Pneumonia, unspecified organism: Secondary | ICD-10-CM | POA: Insufficient documentation

## 2024-07-03 DIAGNOSIS — Z8611 Personal history of tuberculosis: Secondary | ICD-10-CM | POA: Insufficient documentation

## 2024-07-03 DIAGNOSIS — D472 Monoclonal gammopathy: Secondary | ICD-10-CM | POA: Insufficient documentation

## 2024-07-03 DIAGNOSIS — Z85820 Personal history of malignant melanoma of skin: Secondary | ICD-10-CM | POA: Insufficient documentation

## 2024-07-03 DIAGNOSIS — Z8249 Family history of ischemic heart disease and other diseases of the circulatory system: Secondary | ICD-10-CM | POA: Insufficient documentation

## 2024-07-03 DIAGNOSIS — Z833 Family history of diabetes mellitus: Secondary | ICD-10-CM | POA: Insufficient documentation

## 2024-07-03 DIAGNOSIS — Z800 Family history of malignant neoplasm of digestive organs: Secondary | ICD-10-CM | POA: Insufficient documentation

## 2024-07-03 NOTE — Progress Notes (Addendum)
 " [facility]   Telephone:(336) [phone] Fax:(336) [phone]   Clinic New consult Note   Patient Care Team: Loreli Kins, MD as PCP - General (Family Medicine) Raford Riggs, MD as PCP - Cardiology (Cardiology) Date of Service: 07/03/2024  CHIEF COMPLAINTS/PURPOSE OF CONSULTATION:  Abnormal clonal proteins, referred by PCP  HISTORY OF PRESENTING ILLNESS:  Shelly [name] 68 y.o. 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 She was recently admitted to the hospital in November for possible NSTEMI in the setting of HF exacerbation. Catheterization has been discussed.  Cardiology checked MM panel at f/up due to stage IV CKD, results show normal IgG, elevated IgA 537, elevated IgM 296, M spike 0.4 with immunofixation showing IgM monoclonal protein with lambda light chain specificity. She has had a chronic normocytic anemia hgb 8-10 range since at least 2013.   She feels her immune systems is not strong, has had TB, meningitis, multiple infections, and covid 5 times. She does not bounce back well. 3 years ago she was walking 5 miles daily, lost weight intentionally and felt well. Beginning 11/2023 she started not feeling well, BP was hard to control. 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  She developed progressive fatigue/weakness and dyspnea.  She has frequent nausea and IBS-see in history of endometriosis that required multiple surgeries including a partial colectomy about 10 years ago.  On a recent vacation she has shortness of breath with abdominal pressure and discomfort and came to the ED, treated for pneumonia, acute heart failure exacerbation and NSTEMI.    Socially, she is divorced has 1 daughter.  She is retired aeronautical engineer and now teaches.  She is independent with ADLs.  Drinks alcohol occasionally, denies tobacco or drug use.  Mother had anemia.  Today she  presents with her daughter, dyspnea improved after hospital discharge. Has abdominal pressure/discomfort at times.  She feels blue from not feeling well.     MEDICAL HISTORY:  Past Medical History:  Diagnosis Date   Acid reflux    Anemia    Anxiety    Arthritis    Asthma    infrequent problems   Cardiomyopathy (HCC)    CHF (congestive heart failure) (HCC)    Chronic UTI (urinary tract infection)    Complication of anesthesia    I quit breathing during surgery 2008 - has had surgery since with no problems    Diabetes mellitus    Endometriosis    hx of    Headache    hx of migraines   Hepatitis B    hx of dx during ruptured appendix hospitalization   Hyperlipemia    Hypertension    IBS (irritable bowel syndrome)    Irritable bowel syndrome    Kidney disease    Obesity    Pneumonia 12/2013   Pneumonia    PONV (postoperative nausea and vomiting)    Rash    sides of neck   Stress incontinence    Tuberculosis 1985    SURGICAL HISTORY: Past Surgical History:  Procedure Laterality Date   ABDOMINAL ADHESION SURGERY  1989   x 7   ABDOMINAL HYSTERECTOMY  01/1996   APPENDECTOMY  1987   BREAST BIOPSY Right 05/18/2022   MM RT BREAST BX W LOC DEV 1ST LESION IMAGE BX SPEC STEREO GUIDE 05/18/2022 [facility]   CHOLECYSTECTOMY  11/24/2000   COLON SURGERY  2010  colon resection due to adhesions   LAPAROSCOPIC LYSIS OF ADHESIONS  07/1991   pelvic adhesions   LEFT AND RIGHT HEART CATHETERIZATION WITH CORONARY ANGIOGRAM N/A 06/29/2014   Procedure: LEFT AND RIGHT HEART CATHETERIZATION WITH CORONARY ANGIOGRAM;  Surgeon: Candyce GORMAN Reek, MD;  Location: [facility] CATH LAB;  Service: Cardiovascular;  Laterality: N/A;   OOPHORECTOMY Right 04/1995   OOPHORECTOMY Left 12/1992   removed left ovary   PARTIAL KNEE ARTHROPLASTY Left 10/27/2013   Procedure: LEFT KNEE UNICOMPARTMENTAL REPLACEMENT ;  Surgeon: Dempsey LULLA Moan, MD;  Location: [facility] ORS;  Service: Orthopedics;  Laterality:  Left;   PARTIAL KNEE ARTHROPLASTY Right 05/21/2014   Procedure: MEDIAL UNICOMPARTMENTAL RIGHT KNEE ARTHROPLASTY;  Surgeon: Dempsey Moan LULLA, MD;  Location: [facility] ORS;  Service: Orthopedics;  Laterality: Right;   TONSILLECTOMY      SOCIAL HISTORY: Social History   Socioeconomic History   Marital status: Divorced    Spouse name: Not on file   Number of children: Not on file   Years of education: Not on file   Highest education level: Not on file  Occupational History   Not on file  Tobacco Use   Smoking status: Never   Smokeless tobacco: Never  Substance and Sexual Activity   Alcohol use: Yes    Comment: occ   Drug use: No   Sexual activity: Not on file  Other Topics Concern   Not on file  Social History Narrative   Not on file   Social Drivers of Health   Tobacco Use: Low Risk (06/28/2024)   Patient History    Smoking Tobacco Use: Never    Smokeless Tobacco Use: Never    Passive Exposure: Not on file  Financial Resource Strain: Not on file  Food Insecurity: No Food Insecurity (05/18/2024)   Epic    Worried About Programme Researcher, Broadcasting/film/video in the Last Year: Never true    Ran Out of Food in the Last Year: Never true  Transportation Needs: No Transportation Needs (05/18/2024)   Epic    Lack of Transportation (Medical): No    Lack of Transportation (Non-Medical): No  Physical Activity: Not on file  Stress: Not on file  Social Connections: Moderately Isolated (05/18/2024)   Social Connection and Isolation Panel    Frequency of Communication with Friends and Family: More than three times a week    Frequency of Social Gatherings with Friends and Family: More than three times a week    Attends Religious Services: More than 4 times per year    Active Member of [employer] or Organizations: No    Attends Banker Meetings: Never    Marital Status: Divorced  Catering Manager Violence: Not At Risk (05/18/2024)   Epic    Fear of Current or Ex-Partner: No    Emotionally Abused: No     Physically Abused: No    Sexually Abused: No  Depression (PHQ2-9): Low Risk (07/03/2024)   Depression (PHQ2-9)    PHQ-2 Score: 0  Alcohol Screen: Not on file  Housing: Low Risk (05/18/2024)   Epic    Unable to Pay for Housing in the Last Year: No    Number of Times Moved in the Last Year: 0    Homeless in the Last Year: No  Utilities: Not At Risk (05/18/2024)   Epic    Threatened with loss of utilities: No  Health Literacy: Not on file    FAMILY HISTORY: Family History  Problem Relation Age of Onset  Hypertension Mother    Heart failure Mother    Stroke Mother    Heart disease Father    Diabetes Father    Liver cancer Brother    Heart attack Maternal Grandmother    Hypertension Maternal Grandmother    Heart attack Paternal Grandmother    Breast cancer Neg Hx     ALLERGIES:  is allergic to singulair  [montelukast  sodium], amlodipine  besylate, atorvastatin, lisinopril, metformin, penicillins, sulfa antibiotics, welchol  [colesevelam hcl], dilaudid  [hydromorphone  hcl], and sulfamethoxazole-trimethoprim.  MEDICATIONS:  Current Outpatient Medications  Medication Sig Dispense Refill   albuterol  (PROVENTIL ) (2.5 MG/3ML) 0.083% nebulizer solution Take 3 mLs (2.5 mg total) by nebulization every 6 (six) hours as needed for wheezing or shortness of breath. 75 mL 11   ALPRAZolam  (XANAX ) 0.5 MG tablet Take 0.5 mg by mouth 3 (three) times daily as needed. Anxiety     aspirin  EC 81 MG tablet Take 1 tablet (81 mg total) by mouth daily. Swallow whole. 30 tablet 12   carvedilol  (COREG ) 25 MG tablet Take 25 mg by mouth 2 (two) times daily with a meal.     cloNIDine  (CATAPRES  - DOSED IN MG/24 HR) 0.2 mg/24hr patch Place 0.2 mg onto the skin once a week.     COMBIVENT RESPIMAT 20-100 MCG/ACT AERS respimat Take 1 puff by mouth every 6 (six) hours as needed for shortness of breath.     estradiol (ESTRACE) 0.1 MG/GM vaginal cream Place 1 Applicatorful vaginally 2 (two) times a week.      ezetimibe  (ZETIA ) 10 MG tablet Take 10 mg by mouth daily.     famotidine  (PEPCID ) 40 MG tablet Take 40 mg by mouth at bedtime.     ferrous sulfate  325 (65 FE) MG tablet Take 1 tablet (325 mg total) by mouth daily with breakfast. 30 tablet 1   fluticasone  (FLONASE ) 50 MCG/ACT nasal spray Place 1 spray into both nostrils daily as needed. For allergy nasal congestion     furosemide  (LASIX ) 40 MG tablet Take 1 tablet (40 mg total) by mouth daily. 30 tablet 1   hydrALAZINE  (APRESOLINE ) 50 MG tablet TAKE 1 AND 1/2 TABLETS EVERY MORNING AND EVENING AND 1/2 TABLET MIDDAY 405 tablet 3   HYDROcodone -acetaminophen  (NORCO/VICODIN) 5-325 MG tablet Take 0.5 tablets by mouth every 6 (six) hours as needed for moderate pain (pain score 4-6).     insulin  glargine, 1 Unit Dial, (TOUJEO  SOLOSTAR) 300 UNIT/ML Solostar Pen Inject 2 Units into the skin once a week.     melatonin 5 MG TABS Take 5 mg by mouth at bedtime.     pantoprazole  (PROTONIX ) 40 MG tablet Take 40 mg by mouth daily.     polyethylene glycol powder (GLYCOLAX /MIRALAX ) 17 GM/SCOOP powder Take 17 g by mouth daily. Dissolve 1 capful (17g) in 4-8 ounces of liquid and take by mouth daily. 238 g 0   promethazine  (PHENERGAN ) 25 MG tablet Take 1 tablet (25 mg total) by mouth daily as needed. (Patient taking differently: Take 25 mg by mouth daily as needed for refractory nausea / vomiting.) 30 tablet 0   senna-docusate (SENOKOT-S) 8.6-50 MG tablet Take 2 tablets by mouth at bedtime. 30 tablet 0   cloNIDine  (CATAPRES  - DOSED IN MG/24 HR) 0.1 mg/24hr patch 0.1 mg once a week. (Patient not taking: Reported on 07/03/2024)     No current facility-administered medications for this visit.    REVIEW OF SYSTEMS:   Constitutional: Denies fevers, chills or abnormal night sweats (+) fatigue Eyes: Denies blurriness  of vision, double vision or watery eyes Ears, nose, mouth, throat, and face: Denies mucositis or sore throat Respiratory: Denies cough or wheezes  (+)dyspnea Cardiovascular: Denies palpitation, chest discomfort or lower extremity swelling (+)CHF Gastrointestinal:  Denies nausea, heartburn or change in bowel habits (+)IBS-C (+)partial colectomy 2/2 endometriosis  Skin: Denies abnormal skin rashes Lymphatics: Denies new lymphadenopathy or easy bruising Neurological:Denies numbness, tingling (+) weakness Behavioral/Psych: Mood is stable, no new changes (+) blues' All other systems were reviewed with the patient and are negative.  PHYSICAL EXAMINATION:  Vitals:   07/03/24 1341 07/03/24 1346  BP: (!) (P) 184/64 (!) (P) 167/65  Pulse: 69   Resp: 18   Temp: 97.7 F (36.5 C)   SpO2: 98%    Filed Weights   07/03/24 1341  Weight: 190 lb 12.8 oz (86.5 kg)    GENERAL:alert, no distress and comfortable SKIN:no rashes or significant lesions EYES:  sclera clear NECK: without mass LYMPH:  no palpable cervical or supraclavicular lymphadenopathy  LUNGS: clear with normal breathing effort HEART: regular rate & rhythm, mild bilateral lower extremity edema ABDOMEN:abdomen soft, non-tender and normal bowel sounds Musculoskeletal:no cyanosis of digits and no clubbing  PSYCH: alert & oriented x 3 with fluent speech NEURO: no focal motor/sensory deficits  LABORATORY DATA:  I have reviewed the data as listed    Latest Ref Rng & Units 06/21/2024   10:36 AM 05/31/2024   11:48 AM 05/20/2024    4:02 AM  CBC  WBC 3.4 - 10.8 x10E3/uL 9.6  9.8  14.5   Hemoglobin 11.1 - 15.9 g/dL 89.3  89.6  8.3   Hematocrit 34.0 - 46.6 % 32.5  32.5  25.8   Platelets 150 - 450 x10E3/uL 298  411  249        Latest Ref Rng & Units 06/21/2024   10:36 AM 06/12/2024    2:58 PM 06/01/2024   10:41 AM  CMP  Glucose 70 - 99 mg/dL 867  854    BUN 8 - 27 mg/dL 61  51    Creatinine 9.42 - 1.00 mg/dL 7.49  7.73    Sodium 865 - 144 mmol/L 135  138    Potassium 3.5 - 5.2 mmol/L 4.5  5.2  5.4   Chloride 96 - 106 mmol/L 98  101    CO2 20 - 29 mmol/L 21  21     Calcium  8.7 - 10.3 mg/dL 9.0  9.4       RADIOGRAPHIC STUDIES: I have personally reviewed the radiological images as listed and agreed with the findings in the report. No results found.  ASSESSMENT & PLAN: 68 year old female   Abnormal monoclonal protein - We reviewed her medical record in detail with the patient and family. -She has elevated IgA > IgM, with mildly elevated M protein 0.4; IFE shows IgM monoclonal protein with lambda light chain specificity; no light chain levels -We discussed the nature of MGUS, smoldering myeloma, and multiple myeloma. 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 Slightly higher risk in IgA  -We will repeat SPEP and IFE with QIGs, light chains, and 24-hour UPEP.  -Pending today's levels we may recommend a bone marrow biopsy, she is open to that if necessary -Follow-up pending results  Anemia -She has had a  chronic normocytic anemia since at least 2013, baseline 10 range -She gets acute on chronic anemia in the 8-9 range with infections and other episodes requiring hospitalization, most recently 05/2024 for HF exacerbation and pneumonia, she has recovered to baseline -Folate and B12 normal on 05/18/2024 -Currently takes oral iron  ferrous sulfate  p.o. once daily -Follow-up PCP and nephrology  DM, HTN, HL, IBS, chronic nausea - Continue med regimen and PCP follow-up   PLAN: -Medical record reviewed -Lab today (SPEP and IFE with QIGs, light chains, and 24-hour UPEP) -Possible bone marrow biopsy pending results -F/up pending today's results, if MGUS tentative lab/ov in 3 months (then 6 months if stable) -Pt seen with Dr. Lanny  Orders Placed This Encounter  Procedures   24-Hr Ur UPEP/UIFE/Light Chains/TP    Standing Status:    Future    Expiration Date:   07/03/2025   Kappa/lambda light chains    Standing Status:   Future    Number of Occurrences:   1    Expiration Date:   07/03/2025   Multiple Myeloma Panel (SPEP&IFE w/QIG)    Standing Status:   Future    Number of Occurrences:   1    Expiration Date:   07/03/2025   CBC with Differential (Cancer Center Only)    Standing Status:   Future    Expected Date:   10/02/2024    Expiration Date:   07/04/2025   CMP (Cancer Center only)    Standing Status:   Future    Expected Date:   10/02/2024    Expiration Date:   07/04/2025   Kappa/lambda light chains    Standing Status:   Future    Expected Date:   10/02/2024    Expiration Date:   07/04/2025   Multiple Myeloma Panel (SPEP&IFE w/QIG)    Standing Status:   Future    Expected Date:   10/02/2024    Expiration Date:   07/04/2025      All questions were answered. The patient knows to call the clinic with any problems, questions or concerns.      Meriah Shands K Adleigh Mcmasters, NP 07/04/2024   Addendum I have seen the patient, examined her. I agree with the assessment and and plan and have edited the notes.   68 y.o. female with PMH including CHF, hypertension, NSTEMI, asthma, OA COPD, GERD, DM, OA, CKD, HL is referred for positive M protein.  Was recently hospitalized for CHF exacerbation, SPEP showed positive M protein 0.4, with slightly elevated IgA and IgM, light chain level were not checked.  Will repeat multiple myeloma panel today, to determine if she needs a bone marrow biopsy.  No clinical concern for multiple myeloma.  Questions were answered.  Onita Lanny MD 07/03/2024 "

## 2024-07-04 ENCOUNTER — Encounter: Payer: Self-pay | Admitting: Nurse Practitioner

## 2024-07-04 LAB — KAPPA/LAMBDA LIGHT CHAINS
Kappa free light chain: 58.4 mg/L — ABNORMAL HIGH (ref 3.3–19.4)
Kappa, lambda light chain ratio: 0.92 (ref 0.26–1.65)
Lambda free light chains: 63.5 mg/L — ABNORMAL HIGH (ref 5.7–26.3)

## 2024-07-07 ENCOUNTER — Ambulatory Visit (HOSPITAL_COMMUNITY)
Admission: RE | Admit: 2024-07-07 | Discharge: 2024-07-07 | Disposition: A | Discharge status: Home / Self Care | Source: Ambulatory Visit | Attending: Physician Assistant | Admitting: Physician Assistant

## 2024-07-07 DIAGNOSIS — I509 Heart failure, unspecified: Secondary | ICD-10-CM | POA: Diagnosis not present

## 2024-07-07 DIAGNOSIS — E119 Type 2 diabetes mellitus without complications: Secondary | ICD-10-CM | POA: Diagnosis not present

## 2024-07-07 DIAGNOSIS — E785 Hyperlipidemia, unspecified: Secondary | ICD-10-CM | POA: Diagnosis not present

## 2024-07-07 DIAGNOSIS — I429 Cardiomyopathy, unspecified: Secondary | ICD-10-CM | POA: Diagnosis present

## 2024-07-07 DIAGNOSIS — I700 Atherosclerosis of aorta: Secondary | ICD-10-CM

## 2024-07-07 DIAGNOSIS — I350 Nonrheumatic aortic (valve) stenosis: Secondary | ICD-10-CM | POA: Diagnosis not present

## 2024-07-07 DIAGNOSIS — I110 Hypertensive heart disease with heart failure: Secondary | ICD-10-CM | POA: Insufficient documentation

## 2024-07-08 LAB — ECHOCARDIOGRAM LIMITED
AR max vel: 1.12 cm2
AV Area VTI: 1.09 cm2
AV Area mean vel: 0.99 cm2
AV Mean grad: 14 mmHg
AV Peak grad: 21.6 mmHg
Ao pk vel: 2.32 m/s
Area-P 1/2: 3.05 cm2
S' Lateral: 4.5 cm

## 2024-07-10 DIAGNOSIS — R7989 Other specified abnormal findings of blood chemistry: Secondary | ICD-10-CM | POA: Diagnosis not present

## 2024-07-10 LAB — MULTIPLE MYELOMA PANEL, SERUM
Albumin SerPl Elph-Mcnc: 3.2 g/dL (ref 2.9–4.4)
Albumin/Glob SerPl: 0.9 (ref 0.7–1.7)
Alpha 1: 0.3 g/dL (ref 0.0–0.4)
Alpha2 Glob SerPl Elph-Mcnc: 0.9 g/dL (ref 0.4–1.0)
B-Globulin SerPl Elph-Mcnc: 1.1 g/dL (ref 0.7–1.3)
Gamma Glob SerPl Elph-Mcnc: 1.4 g/dL (ref 0.4–1.8)
Globulin, Total: 3.7 g/dL (ref 2.2–3.9)
IgA: 448 mg/dL — ABNORMAL HIGH (ref 87–352)
IgG (Immunoglobin G), Serum: 1306 mg/dL (ref 586–1602)
IgM (Immunoglobulin M), Srm: 205 mg/dL (ref 26–217)
Total Protein ELP: 6.9 g/dL (ref 6.0–8.5)

## 2024-07-11 ENCOUNTER — Other Ambulatory Visit: Payer: Self-pay

## 2024-07-11 DIAGNOSIS — D472 Monoclonal gammopathy: Secondary | ICD-10-CM

## 2024-07-17 LAB — UPEP/UIFE/LIGHT CHAINS/TP, 24-HR UR
% BETA, Urine: 12.4 %
ALPHA 1 URINE: 3.2 %
Albumin, U: 65 %
Alpha 2, Urine: 5.3 %
Free Kappa Lt Chains,Ur: 19.86 mg/L (ref 1.17–86.46)
Free Kappa/Lambda Ratio: 4.48 (ref 1.83–14.26)
Free Lambda Lt Chains,Ur: 4.43 mg/L (ref 0.27–15.21)
GAMMA GLOBULIN URINE: 14.1 %
Total Protein, Urine-Ur/day: 326 mg/(24.h) — ABNORMAL HIGH (ref 30–150)
Total Protein, Urine: 18.1 mg/dL
Total Volume: 1800

## 2024-07-20 ENCOUNTER — Other Ambulatory Visit (HOSPITAL_COMMUNITY): Payer: Self-pay

## 2024-07-20 ENCOUNTER — Ambulatory Visit
Attending: Student in an Organized Health Care Education/Training Program | Admitting: Pharmacist Clinician (PhC)/ Clinical Pharmacy Specialist

## 2024-07-20 ENCOUNTER — Telehealth: Payer: Self-pay | Admitting: Pharmacist Clinician (PhC)/ Clinical Pharmacy Specialist

## 2024-07-20 ENCOUNTER — Telehealth: Payer: Self-pay | Admitting: Pharmacy Technician

## 2024-07-20 ENCOUNTER — Encounter: Payer: Self-pay | Admitting: Pharmacist Clinician (PhC)/ Clinical Pharmacy Specialist

## 2024-07-20 DIAGNOSIS — E782 Mixed hyperlipidemia: Secondary | ICD-10-CM

## 2024-07-20 MED ORDER — REPATHA SURECLICK 140 MG/ML ~~LOC~~ SOAJ: 140.0000 mg | SUBCUTANEOUS | 3 refills | Status: AC

## 2024-07-20 NOTE — Telephone Encounter (Signed)
3 month labs ordered.

## 2024-07-20 NOTE — Telephone Encounter (Signed)
 Please do PA for Repatha

## 2024-07-20 NOTE — Addendum Note (Signed)
 Addended by: Gabriel Conry N on: 07/20/2024 01:21 PM   Modules accepted: Orders

## 2024-07-20 NOTE — Patient Instructions (Signed)
 Your Results:             Your most recent labs Goal  Total Cholesterol 180 < 200  Triglycerides 213 < 150  HDL (happy/good cholesterol) 35 > 40  LDL (lousy/bad cholesterol 107 < 55   Medication changes:  We will start the process to get Repatha  covered by your insurance.  Once the prior authorization is complete, I will call/send a MyChart message to let you know and confirm pharmacy information.   You will take one injection every 14 days  Lab orders:  We want to repeat labs after 2-3 months.  We will send you a lab order to remind you once we get closer to that time.    Patient Assistance:    We will sign you up for a [employer] Grant once your medication is approved by [employer].  I will call you with the ID number, then you will take this information to the pharmacy.  They will bill it after your insurance, bringing your copay to $0.  The grant will pay the first $2,500 in a one year period.    ID   BIN [id number]  PCN [id number]  GRP [id number]    Thank you for choosing [facility]

## 2024-07-20 NOTE — Telephone Encounter (Signed)
" ° °  Pharmacy Patient Advocate Encounter   Received notification from Pt Calls Messages that prior authorization for REPATHA  is required/requested.   Insurance verification completed.   The patient is insured through [facility].   Per test claim: PA NOT NEED 435.48  "

## 2024-07-20 NOTE — Telephone Encounter (Signed)
 PA not required,   [employer] grant approved  ID        [id number] BIN      [id number] PCN     [id number] GRP     [id number]  Expires 06/19/25

## 2024-07-20 NOTE — Assessment & Plan Note (Signed)
 Assessment: Patient with NSTEMI not at LDL goal of < 55 Most recent LDL 107 on 05/31/2024 Has been compliant with ezetimibe  10 mg daily Not able to tolerate statins secondary to myalgias - atorvastatin, rosuvastatin  Reviewed options for lowering LDL cholesterol, including PCSK-9 inhibitors and inclisiran.  Discussed mechanisms of action, dosing, side effects, potential decreases in LDL cholesterol and costs.  Also reviewed potential options for patient assistance.  Plan: Patient agreeable to starting Repatha  140 mg q14d Repeat labs after:  3 months Lipid Liver function Patient was given information on [employer] - will sign patient up when PA approved Marital status Income < $72,000 (single)

## 2024-07-20 NOTE — Progress Notes (Signed)
 "  Office Visit    Patient Name: Shelly [name] Date of Encounter: 07/20/2024  Primary Care Provider:  Loreli Kins, MD Primary Cardiologist:  Annabella Scarce, MD  Chief Complaint    Hyperlipidemia   Significant Past Medical History   HFmrEF On GDMT improved to 50-55%  HTN Improved on carvedilol  25, clonidine  patch, hydralazine    NSTEMI 11/25   DM2 11/25 A1c 6.9  CKD  Stage 4GFR 20     Allergies[1]  History of Present Illness    Shelly [name] is a 69 y.o. female patient of Dr Scarce, in the office today to discuss options for cholesterol management.  Patient was hospitalized last November with NSTEMI/HFpEF exacerbation.  She tells me that before this event she had lost 90 pounds over a two year period.  Currently she is frustrated at the pace of her recovery from those events.    Insurance Carrier: [employer] [phone] [id number]   Repatha  - $440 deductible then 18% ($109/month)   [employer]:   yes, qualifies   LDL Cholesterol goal:  LDL < 55  Current Medications: ezetimibe  10 mg daily     Previously tried: colesevelam - constipation; atorvastatin - strange feelings, muscle fatigue , rosuvastatin  - muscle fatigue  Family Hx: mother with HF, HTN, CVA, father with heart disease and DM, both grandmothers with MI  Social Hx: Tobacco: no Alcohol: occasional social drink  Diet:  lots of vegetables, pork loin, chicken, protein bars; limited by nausea - so eating canned soups more recently  Exercise: previously worked out regularly with exercise group, had to stop after hospital d/c   Accessory Clinical Findings   04/2024 in [facility]: TC 180, TG 213, HDL 35, LDL 107  No results found for: LIPOA  Lab Results  Component Value Date   ALT 12 05/31/2024   AST 13 05/31/2024   ALKPHOS 86 05/31/2024   BILITOT 0.4 05/31/2024   Lab Results  Component Value Date   CREATININE 2.50 (H) 06/21/2024   BUN 61 (H) 06/21/2024   NA 135 06/21/2024   K 4.5 06/21/2024   CL 98  06/21/2024   CO2 21 06/21/2024   Lab Results  Component Value Date   HGBA1C 6.9 (H) 05/17/2024    Home Medications    Current Outpatient Medications  Medication Sig Dispense Refill   albuterol  (PROVENTIL ) (2.5 MG/3ML) 0.083% nebulizer solution Take 3 mLs (2.5 mg total) by nebulization every 6 (six) hours as needed for wheezing or shortness of breath. 75 mL 11   ALPRAZolam  (XANAX ) 0.5 MG tablet Take 0.5 mg by mouth 3 (three) times daily as needed. Anxiety     aspirin  EC 81 MG tablet Take 1 tablet (81 mg total) by mouth daily. Swallow whole. 30 tablet 12   carvedilol  (COREG ) 25 MG tablet Take 25 mg by mouth 2 (two) times daily with a meal.     cloNIDine  (CATAPRES  - DOSED IN MG/24 HR) 0.1 mg/24hr patch 0.1 mg once a week. (Patient not taking: Reported on 07/03/2024)     cloNIDine  (CATAPRES  - DOSED IN MG/24 HR) 0.2 mg/24hr patch Place 0.2 mg onto the skin once a week.     COMBIVENT RESPIMAT 20-100 MCG/ACT AERS respimat Take 1 puff by mouth every 6 (six) hours as needed for shortness of breath.     estradiol (ESTRACE) 0.1 MG/GM vaginal cream Place 1 Applicatorful vaginally 2 (two) times a week.     ezetimibe  (ZETIA ) 10 MG tablet Take 10 mg by mouth daily.  famotidine  (PEPCID ) 40 MG tablet Take 40 mg by mouth at bedtime.     ferrous sulfate  325 (65 FE) MG tablet Take 1 tablet (325 mg total) by mouth daily with breakfast. 30 tablet 1   fluticasone  (FLONASE ) 50 MCG/ACT nasal spray Place 1 spray into both nostrils daily as needed. For allergy nasal congestion     furosemide  (LASIX ) 40 MG tablet Take 1 tablet (40 mg total) by mouth daily. 30 tablet 1   hydrALAZINE  (APRESOLINE ) 50 MG tablet TAKE 1 AND 1/2 TABLETS EVERY MORNING AND EVENING AND 1/2 TABLET MIDDAY 405 tablet 3   HYDROcodone -acetaminophen  (NORCO/VICODIN) 5-325 MG tablet Take 0.5 tablets by mouth every 6 (six) hours as needed for moderate pain (pain score 4-6).     insulin  glargine, 1 Unit Dial, (TOUJEO  SOLOSTAR) 300 UNIT/ML Solostar  Pen Inject 2 Units into the skin once a week.     melatonin 5 MG TABS Take 5 mg by mouth at bedtime.     pantoprazole  (PROTONIX ) 40 MG tablet Take 40 mg by mouth daily.     polyethylene glycol powder (GLYCOLAX /MIRALAX ) 17 GM/SCOOP powder Take 17 g by mouth daily. Dissolve 1 capful (17g) in 4-8 ounces of liquid and take by mouth daily. 238 g 0   promethazine  (PHENERGAN ) 25 MG tablet Take 1 tablet (25 mg total) by mouth daily as needed. (Patient taking differently: Take 25 mg by mouth daily as needed for refractory nausea / vomiting.) 30 tablet 0   senna-docusate (SENOKOT-S) 8.6-50 MG tablet Take 2 tablets by mouth at bedtime. 30 tablet 0   No current facility-administered medications for this visit.     Assessment & Plan    Mixed hyperlipidemia Assessment: Patient with NSTEMI not at LDL goal of < 55 Most recent LDL 107 on 05/31/2024 Has been compliant with ezetimibe  10 mg daily Not able to tolerate statins secondary to myalgias - atorvastatin, rosuvastatin  Reviewed options for lowering LDL cholesterol, including PCSK-9 inhibitors and inclisiran.  Discussed mechanisms of action, dosing, side effects, potential decreases in LDL cholesterol and costs.  Also reviewed potential options for patient assistance.  Plan: Patient agreeable to starting Repatha  140 mg q14d Repeat labs after:  3 months Lipid Liver function Patient was given information on [employer] - will sign patient up when PA approved Marital status Income < $72,000 (single)   [name], PharmD CPP [facility] [address]   [place], [place] [address] [phone]  07/20/2024, 10:47 AM       [1]  Allergies Allergen Reactions   Singulair  [Montelukast  Sodium] Other (See Comments)    Vivid dreams   Amlodipine  Besylate     Other reaction(s): fatgiue and nausea   Atorvastatin     Other reaction(s): strange feelings   Lisinopril Cough   Metformin Diarrhea   Penicillins Other (See Comments)    Other  reaction(s): rash Unknown reaction.    Sulfa Antibiotics Nausea And Vomiting   Welchol  [Colesevelam Hcl]     Other reaction(s): constipation   Dilaudid  [Hydromorphone  Hcl] Anxiety and Other (See Comments)    paranoia   Sulfamethoxazole-Trimethoprim Rash   "

## 2024-07-24 NOTE — Addendum Note (Signed)
 Addended by: Rileyann Florance N on: 07/24/2024 07:58 AM   Modules accepted: Orders

## 2024-07-25 ENCOUNTER — Telehealth: Payer: Self-pay | Admitting: Nurse Practitioner

## 2024-07-25 LAB — CBC
Hematocrit: 32.7 % — ABNORMAL LOW (ref 34.0–46.6)
Hemoglobin: 10.6 g/dL — ABNORMAL LOW (ref 11.1–15.9)
MCH: 30.4 pg (ref 26.6–33.0)
MCHC: 32.4 g/dL (ref 31.5–35.7)
MCV: 94 fL (ref 79–97)
Platelets: 291 x10E3/uL (ref 150–450)
RBC: 3.49 x10E6/uL — ABNORMAL LOW (ref 3.77–5.28)
RDW: 12.7 % (ref 11.7–15.4)
WBC: 11.1 x10E3/uL — ABNORMAL HIGH (ref 3.4–10.8)

## 2024-07-25 LAB — BASIC METABOLIC PANEL WITH GFR
BUN/Creatinine Ratio: 22 (ref 12–28)
BUN: 52 mg/dL — ABNORMAL HIGH (ref 8–27)
CO2: 23 mmol/L (ref 20–29)
Calcium: 9.7 mg/dL (ref 8.7–10.3)
Chloride: 100 mmol/L (ref 96–106)
Creatinine, Ser: 2.4 mg/dL — ABNORMAL HIGH (ref 0.57–1.00)
Glucose: 75 mg/dL (ref 70–99)
Potassium: 4.9 mmol/L (ref 3.5–5.2)
Sodium: 141 mmol/L (ref 134–144)
eGFR: 21 mL/min/1.73 — ABNORMAL LOW

## 2024-07-25 NOTE — Telephone Encounter (Signed)
 Reviewed work up and discussed with Dr. Lanny. Notified patient both light chains are elevated with normal ratio, non specific. M protein not detected in blood or urine. No need for bone marrow biopsy at this time. Will monitor. Keep appts 09/2024.   She is OK to proceed with cardiology recommendations as well.   She understands and appreciated the call.   Laquita Harlan, NP

## 2024-07-31 NOTE — Progress Notes (Unsigned)
 "  Cardiology Office Note    Date:  08/01/2024  ID:  Clydean, Posas 06-22-1956, MRN [id number] PCP:  Loreli Kins, MD  Cardiologist:  Annabella Scarce, MD  Electrophysiologist:  None   Chief Complaint: discuss cath  History of Present Illness: .    ASTER SCREWS is a 69 y.o. female with visit-pertinent history of prior NICM 2015 but abnormal stress test 05/2024, most recently chronic HFmrEF, mitral regurgitation and aortic stenosis (varying degrees, mild MR and moderate AS 06/2024), HTN, HLD, gastric adenoma, CKD stage IV, anemia, MGUS, DM, diabetic retinopathy, acid reflux, anxiety, obesity, arthritis, migraines, IBS, endometriosis, melanoma s/p excision.   2015-2016: diagnosed with acute HFrEF in 2015 with EF 25-30%, moderate-severe MR of unclear etiology. Cath showed no significant CAD, LVEF 20%, moderate pHTN. She was diuresed and started on GDMT. F/u echo 2016 showed EF 50-55%. The patient was told perhaps she had had a virus that attacked her heart. She did not recall being sick before diagnosis. She was initially lost to f/u after 2016. 2021 echo showed EF 45-50%, G1DD, elevated LVEDP moderate LAE, mild AS, trivial MR. Over the years she has also lost a significant amount of weight.   03/2024 onward: She re-established care in 03/2024 for severe HTN and dyspnea, see full note for details. She was also following with nephrology (Dr. Norine) for progressive CKD. Renal artery duplex 03/2024 showed no significant RAS. She had high plasma norepinephrine level of 891 by nephrology. MR abdomen 04/13/24 showed no suspicions renal lesions or adrenal lesions. Over the visits with our team her regimen was further adjusted by Dr. Scarce. Outpatient echo 05/03/24 showed EF 55%, mild LVH, G1DD, normal RV, mild-moderate MR, mild AS, dilated IVC.    She was admitted 11/5-11/11/25 with fever, chest pain, shortness of breath. Etiology of fever not clear, + perinephric stranding & possible nodular  consolidation but infection felt less likely. Dr. Wonda followed her for NSTEMI (peak troponin 910) and acute on chronic HF with plan for outpatient stress test. Nephrology also followed. She was diuresed and medications adjusted including addition of iron  for anemia.  Outpatient stress test 05/25/24 showed partially reversible perfusion defect at apex and apical septal wall, consistent with infarct with peri-infarct ischemia, EF 32%, with calcifications in the LAD. I discussed case with Dr. Wonda in follow-up 05/31/24 since he had managed her in the hospital. There was consideration of cath, however, given initial improvement in symptoms at that visit, Dr. Wonda recommended medical management with repeat limited echo to evaluate LV function. If this were truly down as suggested in stress test, would pursue cath, but if reassuring, could continue with medical management as long as asymptomatic. Follow-up labs notable for marginal M spike (referred to hematology now following for MGUS) and hyperkalemia requiring transient Lokelma . She was also referred to lipid clinic for persistently elevated LDL and is being started on Repatha  (prior sluggishness with rosuvastatin , strange feelings with atorvastatin, unknown reaction to fluvastatin, and constipation with Welchol). At follow-up with me 06/21/24, she noted return of decrease in stamina and fatigue. Echocardiogram performed 06/2024 confirmed reduced EF to 40-45% (42%), mild LV dilation, mild LVH, G1DD, normal RV, moderate LAE, trivial MR, moderate AS. I subsequently discussed case with Dr. Scarce who recommended proceeding with cath and if this were negative, move to PYP next.  She returns for follow-up. She battled a URI over the last 2 weeks and has turned a corner. She is finishing out a course of prednisone for her  cough which has improved. She notes that she was able to do a mile walking for the first time in a while yesterday (previously stopping about  half a mile in) but still notices that her stamina/breathing remains decreased from baseline. She has also noticed rare episodic bouts of chest pressure as well as one chest tightening/spasm sensation that traveled to other parts of her body without clear relationship to activity. The worst one was what prompted hospitalization in 05/2024. None today. She has since seen Dr. Norine and reports they revisited the idea of cath, and reports that nephrology was supportive of proceeding if felt necessary. Per outpatient MyChart messaging, she was able to discern that her clonidine  patch was causing some nausea and dizziness with complete resolution of these issues since stopping. She has gone back to clonidine  pills 0.1mg  nightly and inquires about increasing the dose as home BPs have not been as well controlled since the switch. Her mid day hydralazine  was previously cut down by Dr. Raford due to these symptoms but now Ms. Krzyzanowski is open to revisiting increasing back to 75mg  TID if needed. For now she is taking 75mg  QAM, 50mg  mid day, 75mg  QPM.   Labwork independently reviewed: 07/25/24 Hgb 10.6, plt OK, WBC 11.1 c/w prior, K 4.9, Cr 2.40 c/w prior 06/12/2024 Cr 2.26, K 5.2, alb 3.8, AST ALT OK  [facility] 06/07/24 K 4.7, Cr 2.54 05/31/24 K 5.6, Cr 2.63 (see EMR for additional K/Cr levels) 05/2024 alb 2.8, K 4.5, Cr 2.62, Hgb 8.3, plt OK, TFTS ok, AST ALT OK 03/10/24 [facility] DKA by nephrology plasma norepinephrine elevated to 891, epinephrine/dopamine reported within normal limits, plasma normetanephrine and metanephrine reported WNL, PRA 0.635, aldosterone 1.3, cortisol low at 5.8, TSH wnl, Cr 2.34, UA negative protein, TSH OK, Cr 2.34 10/2023 LDL 174, trig 213  ROS: .    Please see the history of present illness. Reports she no longer takes insulin  since home CBGs in the 90s. All other systems are reviewed and otherwise negative.  Studies Reviewed: SABRA    EKG:  EKG is ordered today, personally reviewed,  demonstrating  EKG Interpretation Date/Time:  Tuesday August 01 2024 10:52:08 EST Ventricular Rate:  61 PR Interval:  168 QRS Duration:  98 QT Interval:  440 QTC Calculation: 442 R Axis:   13  Text Interpretation: Normal sinus rhythm Minimal voltage criteria for LVH, may be normal variant ( Cornell product ) Anterior infarct , age undetermined Poor R wave progression when compared to prior Reconfirmed by Avi Archuleta [phone]) on 08/01/2024 11:04:41 AM  R wave progression changes may be due to lead placement. No acute STTW changes.  CV Studies: Cardiac studies reviewed are outlined and summarized above. Otherwise please see EMR for full report.   Current Reported Medications:.    Active Medications[1]  Physical Exam:    VS:  BP (!) 146/78   Pulse 62   Resp 16   Ht 5' 2 (1.575 m)   Wt 185 lb 12.8 oz (84.3 kg)   SpO2 97%   BMI 33.98 kg/m    Wt Readings from Last 3 Encounters:  08/01/24 185 lb 12.8 oz (84.3 kg)  07/03/24 190 lb 12.8 oz (86.5 kg)  06/28/24 183 lb 6.4 oz (83.2 kg)    GEN: Well nourished, well developed in no acute distress NECK: No JVD; No carotid bruits CARDIAC: RRR, no murmurs, rubs, gallops RESPIRATORY:  Clear to auscultation without rales, wheezing or rhonchi  ABDOMEN: Soft, non-tender, non-distended EXTREMITIES:  No  edema; No acute deformity   Asessement and Plan:.    1. History of NSTEMI, coronary calcifications, abnormal stress test - has had some improvement since initial control of elevated blood pressure but continues with decreased stamina with exertion from baseline as well as episodic chest pressure with some atypical features. Discussed options of medical management versus proceeding with cardiac catheterization as was discussed with her primary cardiologist. She is in favor of proceeding with cath and I agree. We already have had her following closely with nephrology and she reports that they were also supportive of moving forward if felt  necessary. Per shared decision making, we will set this up on a day when Dr. Wonda is in the lab since he is familiar with her case (and also took care of her dad for many years). Will plan for [facility] to also assess output and pressures to evaluate for optimization. Would avoid LV gram and limit contrast use. In review of the next few dates, she has chosen 08/15/24 for her cath date so will have her return for CBC, BMET on 1/29 to re-evaluate closer to that time. Per discussion with Dr. Wonda, will have her plan to hold Lasix /furosemide  the day before and day of her procedure, with arrival time 4 hours prior to procedure for 3 hours of IV hydration at 1cc/kg/hr. I discussed the possible need to stay overnight with the patient and she is agreeable. Will otherwise continue ASA 81mg  daily, carvedilol  25mg  BID, ezetimibe  10mg  daily, and new Repatha  which lipid clinic is following. See below for additional antihypertensive commentary.  Informed Consent   Shared Decision Making/Informed Consent The risks [stroke (1 in 1000), death (1 in 1000), kidney failure [usually temporary] (1 in 500), bleeding (1 in 200), allergic reaction [possibly serious] (1 in 200)], benefits (diagnostic support and management of coronary artery disease) and alternatives of a cardiac catheterization were discussed in detail with Ms. Boran and she is willing to proceed.     2. Chronic HFmrEF with essential HTN - felt terrible with clonidine  patch so has gone back to clonidine  orally and tolerating much better. Will increase oral dose from 0.1mg  daily to 0.1mg  BID and see how she does. If her blood pressure remains suboptimally controlled with this increase, she is amenable to going up on her hydralazine  back to 75mg  TID. She will also be finishing up a round of prednisone in a few days and this could be impacting her blood pressure so we will make one change at a time. Asked CMA to update med list to reflect how she is presently taking the  hydralazine . Volume status looks OK today. Otherwise continue carvedilol  25mg  BID, Lasix  40mg  daily. She reports she was taken off doxazosin  when her hydralazine  was previously changed, would be amenable to resuming in the future if needed as well. If cath unrevealing, would pursue PYP study next. Can review at next OV or at hospital discharge. Avoid ACE, ARB, ARNI, spironolactone  given CKD and history of hyperkalemia. Will defer SGLT2i to nephrology given the degree of renal insufficiency.  3. Mitral regurgitation, aortic stenosis - follow clinically. Echo 04/2024 with mild-moderate MR, mild AS, with limited echo 06/2024 with mild MR, moderate AS. Can review timing of surveillance echo in follow-up contingent on workup above.  4. CKD stage IV with chronic anemia - will recheck labs on 08/10/24 so that we can have an updated baseline closer to cath.    Disposition: F/u with me, Dr. Raford or Reche Finder (given [facility]  location with Dr. Raford if I do not have any availability) 7-10 days post-cath.  Signed, Severn Goddard N Jerone Cudmore, PA-C      [1]  Current Meds  Medication Sig   albuterol  (PROVENTIL ) (2.5 MG/3ML) 0.083% nebulizer solution Take 3 mLs (2.5 mg total) by nebulization every 6 (six) hours as needed for wheezing or shortness of breath.   ALPRAZolam  (XANAX ) 0.5 MG tablet Take 0.5 mg by mouth 3 (three) times daily as needed. Anxiety   aspirin  EC 81 MG tablet Take 1 tablet (81 mg total) by mouth daily. Swallow whole.   carvedilol  (COREG ) 25 MG tablet Take 25 mg by mouth 2 (two) times daily with a meal.   cloNIDine  (CATAPRES ) 0.1 MG tablet Take 0.1 mg by mouth at bedtime.   COMBIVENT RESPIMAT 20-100 MCG/ACT AERS respimat Take 1 puff by mouth every 6 (six) hours as needed for shortness of breath.   estradiol (ESTRACE) 0.1 MG/GM vaginal cream Place 1 Applicatorful vaginally 2 (two) times a week.   Evolocumab  (REPATHA  SURECLICK) 140 MG/ML SOAJ Inject 140 mg into the skin every 14 (fourteen) days.    ezetimibe  (ZETIA ) 10 MG tablet Take 10 mg by mouth daily.   famotidine  (PEPCID ) 40 MG tablet Take 40 mg by mouth at bedtime.   ferrous sulfate  325 (65 FE) MG tablet Take 1 tablet (325 mg total) by mouth daily with breakfast.   fluticasone  (FLONASE ) 50 MCG/ACT nasal spray Place 1 spray into both nostrils daily as needed. For allergy nasal congestion   furosemide  (LASIX ) 40 MG tablet Take 1 tablet (40 mg total) by mouth daily.   hydrALAZINE  (APRESOLINE ) 50 MG tablet TAKE 1 AND 1/2 TABLETS EVERY MORNING AND EVENING AND 1/2 TABLET MIDDAY   HYDROcodone -acetaminophen  (NORCO/VICODIN) 5-325 MG tablet Take 0.5 tablets by mouth every 6 (six) hours as needed for moderate pain (pain score 4-6).   melatonin 5 MG TABS Take 5 mg by mouth at bedtime.   pantoprazole  (PROTONIX ) 40 MG tablet Take 40 mg by mouth daily.   polyethylene glycol powder (GLYCOLAX /MIRALAX ) 17 GM/SCOOP powder Take 17 g by mouth daily. Dissolve 1 capful (17g) in 4-8 ounces of liquid and take by mouth daily.   predniSONE (DELTASONE) 20 MG tablet Take 20 mg by mouth daily.   promethazine  (PHENERGAN ) 25 MG tablet Take 1 tablet (25 mg total) by mouth daily as needed. (Patient taking differently: Take 25 mg by mouth daily as needed for refractory nausea / vomiting.)   senna-docusate (SENOKOT-S) 8.6-50 MG tablet Take 2 tablets by mouth at bedtime.   [DISCONTINUED] insulin  glargine, 1 Unit Dial, (TOUJEO  SOLOSTAR) 300 UNIT/ML Solostar Pen Inject 2 Units into the skin once a week.   "

## 2024-08-01 ENCOUNTER — Ambulatory Visit: Admitting: Physician Assistant

## 2024-08-01 ENCOUNTER — Encounter: Payer: Self-pay | Admitting: Physician Assistant

## 2024-08-01 VITALS — BP 146/78 | HR 62 | Resp 16 | Ht 62.0 in | Wt 185.8 lb

## 2024-08-01 DIAGNOSIS — I5022 Chronic systolic (congestive) heart failure: Secondary | ICD-10-CM | POA: Diagnosis not present

## 2024-08-01 DIAGNOSIS — Z79899 Other long term (current) drug therapy: Secondary | ICD-10-CM | POA: Diagnosis not present

## 2024-08-01 DIAGNOSIS — I2510 Atherosclerotic heart disease of native coronary artery without angina pectoris: Secondary | ICD-10-CM | POA: Diagnosis not present

## 2024-08-01 DIAGNOSIS — I340 Nonrheumatic mitral (valve) insufficiency: Secondary | ICD-10-CM

## 2024-08-01 DIAGNOSIS — I350 Nonrheumatic aortic (valve) stenosis: Secondary | ICD-10-CM

## 2024-08-01 DIAGNOSIS — I214 Non-ST elevation (NSTEMI) myocardial infarction: Secondary | ICD-10-CM | POA: Diagnosis not present

## 2024-08-01 DIAGNOSIS — N184 Chronic kidney disease, stage 4 (severe): Secondary | ICD-10-CM

## 2024-08-01 DIAGNOSIS — I10 Essential (primary) hypertension: Secondary | ICD-10-CM | POA: Diagnosis not present

## 2024-08-01 MED ORDER — CLONIDINE HCL 0.1 MG PO TABS: 0.1000 mg | ORAL_TABLET | Freq: Two times a day (BID) | ORAL | 11 refills | Status: DC

## 2024-08-01 MED ORDER — CLONIDINE HCL 0.1 MG PO TABS: 0.1000 mg | ORAL_TABLET | Freq: Two times a day (BID) | ORAL | 11 refills | Status: AC

## 2024-08-01 MED ORDER — HYDRALAZINE HCL 50 MG PO TABS: ORAL_TABLET | ORAL | 3 refills | Status: DC

## 2024-08-01 MED ORDER — HYDRALAZINE HCL 50 MG PO TABS: ORAL_TABLET | ORAL | Status: AC

## 2024-08-01 NOTE — Addendum Note (Signed)
 Addended by: CONNY KIEF T on: 08/01/2024 03:50 PM   Modules accepted: Orders

## 2024-08-01 NOTE — Patient Instructions (Addendum)
 Medication Instructions:  INCREASE CLONIDINE  TO 0.1 MG TWICE A DAY  *If you need a refill on your cardiac medications before your next appointment, please call your pharmacy*  Lab Work: BMET AND CBC ON 08/10/24 If you have labs (blood work) drawn today and your tests are completely normal, you will receive your results only by: MyChart Message (if you have MyChart) OR A paper copy in the mail If you have any lab test that is abnormal or we need to change your treatment, we will call you to review the results.  Testing/Procedures: Cardiac Cath scheduled at [facility] Tuesday 2/3   Arrive at 6:30 am for IV fluids    Follow instructions below     Follow-Up: At [facility], you and your health needs are our priority.  As part of our continuing mission to provide you with exceptional heart care, our providers are all part of one team.  This team includes your primary Cardiologist (physician) and Advanced Practice Providers or APPs (Physician Assistants and Nurse Practitioners) who all work together to provide you with the care you need, when you need it.  Your next appointment:   7-10 day(s) AFTER CATH  Provider:   Annabella Scarce, MD  or Reche Finder, NP     [facility] A DE[facility] OF [facility] [address] A DEPT OF THE [place] [address] [place] [address] Dept: [phone] Loc: [phone]  Shelly [name]  08/01/2024  You are scheduled for a Cardiac Catheterization on Tuesday, February 3 with Dr. Ozell [name].  1. Please arrive at the [facility] (Main Entrance A) at [facility]: [address] [place], [place] [address] at 6:30 AM (This time is 4 hour(s) before your procedure for IV fluids.  Free valet parking service is available. You will check in at ADMITTING. The support person will be asked to wait in the waiting room.  It is OK to have someone drop you off and come back when you are ready  to be discharged.    Special note: Every effort is made to have your procedure done on time. Please understand that emergencies sometimes delay scheduled procedures.  2. Diet: Nothing to eat after midnight.   3. Hydration: You need to be well hydrated before your procedure. On February 3, you may drink approved liquids (see below) until 2 hours before the procedure, with 16 oz of water as your last intake.   List of approved liquids water, clear juice, clear tea, black coffee, fruit juices, non-citric and without pulp, carbonated beverages, Gatorade, Kool -Aid, plain Jello-O and plain ice popsicles.  4. Labs: You will need to have blood drawn on , Tuesday 1/27 [facility]  1st floor.  You do not need to be fasting.  5. Medication instructions in preparation for your procedure:     Hold Furosemide  day before cath and Hold morning of     On the morning of your procedure, take your Aspirin  81 mg and any morning medicines NOT listed above.  You may use sips of water.  6. Plan to go home the same day, you will only stay overnight if medically necessary. 7. Bring a current list of your medications and current insurance cards. 8. You MUST have a responsible person to drive you home. 9. Someone MUST be with you the first 24 hours after you arrive home or your discharge will be delayed. 10. Please wear clothes  that are easy to get on and off and wear slip-on shoes.  Thank you for allowing us  to care for you!   -- [facility]    CHECK BLOOD PRESSURE DAILY FOR 1 WEEK AND SEND READINGS VIA MYCHART

## 2024-08-14 ENCOUNTER — Telehealth: Payer: Self-pay | Admitting: *Deleted

## 2024-08-14 ENCOUNTER — Other Ambulatory Visit: Payer: Self-pay | Admitting: *Deleted

## 2024-08-14 ENCOUNTER — Telehealth: Payer: Self-pay | Admitting: Cardiovascular Disease

## 2024-08-14 DIAGNOSIS — Z01812 Encounter for preprocedural laboratory examination: Secondary | ICD-10-CM

## 2024-08-14 NOTE — Telephone Encounter (Signed)
 Patient has a cath scheduled for tomorrow with Dr. Wonda, she said she can't make and would like to reschedule.

## 2024-08-14 NOTE — Telephone Encounter (Signed)
 Thanks, Arlean! I'll make sure we get updated HPI, labs, EKG, etc.  Reche GORMAN Finder, NP

## 2024-09-01 ENCOUNTER — Ambulatory Visit (HOSPITAL_BASED_OUTPATIENT_CLINIC_OR_DEPARTMENT_OTHER): Admitting: Family

## 2024-09-01 ENCOUNTER — Ambulatory Visit: Admitting: Physician Assistant

## 2024-09-05 ENCOUNTER — Ambulatory Visit (HOSPITAL_COMMUNITY): Admission: RE | Admit: 2024-09-05 | Source: Home / Self Care | Admitting: Cardiovascular Disease

## 2024-09-05 ENCOUNTER — Encounter (HOSPITAL_COMMUNITY): Admission: RE | Payer: Self-pay | Source: Home / Self Care

## 2024-09-05 DIAGNOSIS — Z01812 Encounter for preprocedural laboratory examination: Secondary | ICD-10-CM

## 2024-09-25 ENCOUNTER — Inpatient Hospital Stay

## 2024-09-27 ENCOUNTER — Encounter (HOSPITAL_BASED_OUTPATIENT_CLINIC_OR_DEPARTMENT_OTHER): Admitting: Cardiovascular Disease

## 2024-10-02 ENCOUNTER — Inpatient Hospital Stay: Admitting: Nurse Practitioner
